# Patient Record
Sex: Female | Born: 1945 | Race: White | Hispanic: No | Marital: Married | Smoking: Never smoker
Health system: Southern US, Community
[De-identification: ages and names within clinical notes are randomized; demographics above are authoritative.]

## PROBLEM LIST (undated history)

## (undated) DIAGNOSIS — T7840XA Allergy, unspecified, initial encounter: Secondary | ICD-10-CM

## (undated) DIAGNOSIS — C801 Malignant (primary) neoplasm, unspecified: Secondary | ICD-10-CM

## (undated) DIAGNOSIS — T8859XA Other complications of anesthesia, initial encounter: Secondary | ICD-10-CM

## (undated) DIAGNOSIS — Z9221 Personal history of antineoplastic chemotherapy: Secondary | ICD-10-CM

## (undated) DIAGNOSIS — T4145XA Adverse effect of unspecified anesthetic, initial encounter: Secondary | ICD-10-CM

## (undated) DIAGNOSIS — I499 Cardiac arrhythmia, unspecified: Secondary | ICD-10-CM

## (undated) DIAGNOSIS — K219 Gastro-esophageal reflux disease without esophagitis: Secondary | ICD-10-CM

## (undated) DIAGNOSIS — I517 Cardiomegaly: Secondary | ICD-10-CM

## (undated) DIAGNOSIS — Z974 Presence of external hearing-aid: Secondary | ICD-10-CM

## (undated) DIAGNOSIS — Z923 Personal history of irradiation: Secondary | ICD-10-CM

## (undated) DIAGNOSIS — I10 Essential (primary) hypertension: Secondary | ICD-10-CM

## (undated) DIAGNOSIS — T884XXA Failed or difficult intubation, initial encounter: Secondary | ICD-10-CM

## (undated) DIAGNOSIS — Z8619 Personal history of other infectious and parasitic diseases: Secondary | ICD-10-CM

## (undated) HISTORY — PX: BREAST LUMPECTOMY: SHX2

## (undated) HISTORY — DX: Personal history of other infectious and parasitic diseases: Z86.19

## (undated) HISTORY — DX: Essential (primary) hypertension: I10

## (undated) HISTORY — DX: Allergy, unspecified, initial encounter: T78.40XA

## (undated) HISTORY — PX: APPENDECTOMY: SHX54

## (undated) HISTORY — PX: TONSILLECTOMY: SUR1361

## (undated) HISTORY — PX: COLONOSCOPY: SHX174

## (undated) HISTORY — PX: BREAST REDUCTION SURGERY: SHX8

---

## 1997-09-29 DIAGNOSIS — T884XXA Failed or difficult intubation, initial encounter: Secondary | ICD-10-CM

## 1997-09-29 HISTORY — PX: ABDOMINAL HYSTERECTOMY: SHX81

## 1997-09-29 HISTORY — DX: Failed or difficult intubation, initial encounter: T88.4XXA

## 1997-09-29 HISTORY — PX: THYROID LOBECTOMY: SHX420

## 1998-01-22 ENCOUNTER — Other Ambulatory Visit: Admission: RE | Admit: 1998-01-22 | Discharge: 1998-01-22 | Payer: Self-pay | Admitting: *Deleted

## 1998-02-15 ENCOUNTER — Ambulatory Visit (HOSPITAL_COMMUNITY): Admission: RE | Admit: 1998-02-15 | Discharge: 1998-02-16 | Payer: Self-pay | Admitting: Surgery

## 1998-03-05 ENCOUNTER — Inpatient Hospital Stay (HOSPITAL_COMMUNITY): Admission: RE | Admit: 1998-03-05 | Discharge: 1998-03-08 | Payer: Self-pay | Admitting: Obstetrics and Gynecology

## 1999-09-13 ENCOUNTER — Encounter: Admission: RE | Admit: 1999-09-13 | Discharge: 1999-09-13 | Payer: Self-pay | Admitting: *Deleted

## 2000-09-15 ENCOUNTER — Encounter: Admission: RE | Admit: 2000-09-15 | Discharge: 2000-09-15 | Payer: Self-pay | Admitting: *Deleted

## 2001-09-27 ENCOUNTER — Encounter: Admission: RE | Admit: 2001-09-27 | Discharge: 2001-09-27 | Payer: Self-pay | Admitting: *Deleted

## 2001-10-15 ENCOUNTER — Other Ambulatory Visit: Admission: RE | Admit: 2001-10-15 | Discharge: 2001-10-15 | Payer: Self-pay | Admitting: Obstetrics and Gynecology

## 2002-10-03 ENCOUNTER — Encounter: Admission: RE | Admit: 2002-10-03 | Discharge: 2002-10-03 | Payer: Self-pay | Admitting: *Deleted

## 2002-10-11 ENCOUNTER — Other Ambulatory Visit: Admission: RE | Admit: 2002-10-11 | Discharge: 2002-10-11 | Payer: Self-pay | Admitting: Obstetrics and Gynecology

## 2003-10-09 ENCOUNTER — Encounter: Admission: RE | Admit: 2003-10-09 | Discharge: 2003-10-09 | Payer: Self-pay | Admitting: Orthopaedic Surgery

## 2003-10-09 ENCOUNTER — Encounter: Admission: RE | Admit: 2003-10-09 | Discharge: 2003-10-09 | Payer: Self-pay | Admitting: *Deleted

## 2003-10-13 ENCOUNTER — Other Ambulatory Visit: Admission: RE | Admit: 2003-10-13 | Discharge: 2003-10-13 | Payer: Self-pay | Admitting: Obstetrics and Gynecology

## 2004-09-29 HISTORY — PX: ROTATOR CUFF REPAIR: SHX139

## 2005-10-09 ENCOUNTER — Ambulatory Visit (HOSPITAL_COMMUNITY): Admission: RE | Admit: 2005-10-09 | Discharge: 2005-10-09 | Payer: Self-pay | Admitting: *Deleted

## 2006-10-08 ENCOUNTER — Ambulatory Visit: Payer: Self-pay | Admitting: General Surgery

## 2006-10-08 LAB — HM COLONOSCOPY

## 2006-10-12 ENCOUNTER — Ambulatory Visit (HOSPITAL_COMMUNITY): Admission: RE | Admit: 2006-10-12 | Discharge: 2006-10-12 | Payer: Self-pay | Admitting: *Deleted

## 2007-10-15 ENCOUNTER — Ambulatory Visit (HOSPITAL_COMMUNITY): Admission: RE | Admit: 2007-10-15 | Discharge: 2007-10-15 | Payer: Self-pay | Admitting: Obstetrics and Gynecology

## 2008-10-16 ENCOUNTER — Ambulatory Visit (HOSPITAL_COMMUNITY): Admission: RE | Admit: 2008-10-16 | Discharge: 2008-10-16 | Payer: Self-pay | Admitting: Obstetrics and Gynecology

## 2009-10-18 ENCOUNTER — Ambulatory Visit (HOSPITAL_COMMUNITY): Admission: RE | Admit: 2009-10-18 | Discharge: 2009-10-18 | Payer: Self-pay | Admitting: Obstetrics and Gynecology

## 2010-01-15 DIAGNOSIS — B029 Zoster without complications: Secondary | ICD-10-CM

## 2010-01-15 HISTORY — DX: Zoster without complications: B02.9

## 2010-10-21 ENCOUNTER — Ambulatory Visit (HOSPITAL_COMMUNITY)
Admission: RE | Admit: 2010-10-21 | Discharge: 2010-10-21 | Payer: Self-pay | Source: Home / Self Care | Attending: Obstetrics and Gynecology | Admitting: Obstetrics and Gynecology

## 2011-10-01 ENCOUNTER — Other Ambulatory Visit (HOSPITAL_COMMUNITY): Payer: Self-pay | Admitting: Obstetrics and Gynecology

## 2011-10-01 DIAGNOSIS — Z1231 Encounter for screening mammogram for malignant neoplasm of breast: Secondary | ICD-10-CM

## 2011-10-28 ENCOUNTER — Ambulatory Visit (HOSPITAL_COMMUNITY)
Admission: RE | Admit: 2011-10-28 | Discharge: 2011-10-28 | Disposition: A | Discharge status: Home / Self Care | Payer: Managed Care, Other (non HMO) | Source: Ambulatory Visit | Attending: Obstetrics and Gynecology | Admitting: Obstetrics and Gynecology

## 2011-10-28 DIAGNOSIS — Z1231 Encounter for screening mammogram for malignant neoplasm of breast: Secondary | ICD-10-CM | POA: Insufficient documentation

## 2012-09-28 ENCOUNTER — Other Ambulatory Visit (HOSPITAL_COMMUNITY): Payer: Self-pay | Admitting: Obstetrics and Gynecology

## 2012-09-28 DIAGNOSIS — Z1231 Encounter for screening mammogram for malignant neoplasm of breast: Secondary | ICD-10-CM

## 2012-10-29 ENCOUNTER — Ambulatory Visit (HOSPITAL_COMMUNITY): Payer: Medicare Other

## 2012-11-05 ENCOUNTER — Ambulatory Visit (HOSPITAL_COMMUNITY)
Admission: RE | Admit: 2012-11-05 | Discharge: 2012-11-05 | Disposition: A | Discharge status: Home / Self Care | Payer: Medicare Other | Source: Ambulatory Visit | Attending: Obstetrics and Gynecology | Admitting: Obstetrics and Gynecology

## 2012-11-05 DIAGNOSIS — Z1231 Encounter for screening mammogram for malignant neoplasm of breast: Secondary | ICD-10-CM

## 2012-12-31 LAB — CBC AND DIFFERENTIAL
HEMATOCRIT: 38 % (ref 36–46)
Hemoglobin: 12.7 g/dL (ref 12.0–16.0)
PLATELETS: 257 10*3/uL (ref 150–399)
WBC: 6.6 10*3/mL

## 2012-12-31 LAB — BASIC METABOLIC PANEL
BUN: 14 mg/dL (ref 4–21)
CREATININE: 0.8 mg/dL (ref 0.5–1.1)
Glucose: 94 mg/dL
Potassium: 4.5 mmol/L (ref 3.4–5.3)
Sodium: 141 mmol/L (ref 137–147)

## 2012-12-31 LAB — LIPID PANEL
CHOLESTEROL: 206 mg/dL — AB (ref 0–200)
HDL: 64 mg/dL (ref 35–70)
LDL Cholesterol: 122 mg/dL
TRIGLYCERIDES: 99 mg/dL (ref 40–160)

## 2012-12-31 LAB — HEPATIC FUNCTION PANEL
ALT: 21 U/L (ref 7–35)
AST: 18 U/L (ref 13–35)

## 2012-12-31 LAB — TSH: TSH: 1.37 u[IU]/mL (ref 0.41–5.90)

## 2013-06-01 ENCOUNTER — Ambulatory Visit: Payer: Self-pay | Admitting: Family Medicine

## 2013-07-05 ENCOUNTER — Ambulatory Visit: Payer: Self-pay | Admitting: Family Medicine

## 2013-07-25 ENCOUNTER — Encounter (INDEPENDENT_AMBULATORY_CARE_PROVIDER_SITE_OTHER): Payer: Self-pay | Admitting: Surgery

## 2013-07-25 ENCOUNTER — Other Ambulatory Visit (INDEPENDENT_AMBULATORY_CARE_PROVIDER_SITE_OTHER): Payer: Self-pay

## 2013-07-25 ENCOUNTER — Ambulatory Visit (INDEPENDENT_AMBULATORY_CARE_PROVIDER_SITE_OTHER): Payer: Medicare Other | Admitting: Surgery

## 2013-07-25 VITALS — BP 116/82 | HR 88 | Temp 98.4°F | Resp 15 | Ht 66.0 in | Wt 186.2 lb

## 2013-07-25 DIAGNOSIS — E042 Nontoxic multinodular goiter: Secondary | ICD-10-CM | POA: Insufficient documentation

## 2013-07-25 DIAGNOSIS — Z9009 Acquired absence of other part of head and neck: Secondary | ICD-10-CM | POA: Insufficient documentation

## 2013-07-25 NOTE — Patient Instructions (Signed)
Please register for MyChart so that you may access your results online.  Adiana Smelcer M. Paarth Cropper, MD, FACS [facility] Office: [phone]   

## 2013-07-25 NOTE — Progress Notes (Signed)
General Surgery [facility]  Chief Complaint  Patient presents with  . New Evaluation    eval thyroid nodules - referral from Dr. Mila Merry, [facility]    HISTORY: Patient is a 67 year old female known to my surgical practice from previous right thyroid lobectomy in 1999. Final pathologic results were benign on a dominant right thyroid nodule. Patient has been followed by her primary care physician. TSH levels have remained normal and she has not required thyroid hormone supplementation. Patient notes that her voice is occasionally "gravelly" but has not had significant hoarseness. She does have occasional dysphagia.  At recent physical examination the patient was noted to have nodules in the left thyroid lobe. On 10/07/2014she underwent ultrasound examination of the thyroid. This showed no residual tissue in the right thyroid bed. Left thyroid lobe was slightly enlarged at 6.0 cm. The left lobe contained multiple nodules all less than 2 cm in size. There were no particularly worrisome findings.  Patient is referred today for evaluation and recommendations for further management.  Past Medical History  Diagnosis Date  . Hypertension   . Allergy     Current Outpatient Prescriptions  Medication Sig Dispense Refill  . calcium-vitamin D (OSCAL WITH D) 500-200 MG-UNIT per tablet Take 1 tablet by mouth.      . co-enzyme Q-10 30 MG capsule Take 30 mg by mouth 3 (three) times daily.      . fluticasone (FLONASE) 50 MCG/ACT nasal spray Place 2 sprays into the nose daily.      . milk thistle 175 MG tablet Take 175 mg by mouth daily.      . montelukast (SINGULAIR) 10 MG tablet Take 10 mg by mouth at bedtime.      Marland Kitchen omeprazole (PRILOSEC) 20 MG capsule Take 20 mg by mouth daily.      Marland Kitchen telmisartan (MICARDIS) 40 MG tablet Take 40 mg by mouth daily.       No current facility-administered medications for this visit.    Allergies  Allergen Reactions  .  Codeine Sulfate     Vomiting, nausea    Family History  Problem Relation Age of Onset  . Cancer Father     bladder & pancreatic    History   Social History  . Marital Status: Married    Spouse Name: N/A    Number of Children: N/A  . Years of Education: N/A   Social History Main Topics  . Smoking status: Never Smoker   . Smokeless tobacco: Never Used  . Alcohol Use: Yes  . Drug Use: No  . Sexual Activity: None   Other Topics Concern  . None   Social History Narrative  . None    REVIEW OF SYSTEMS - PERTINENT POSITIVES ONLY: Mild vocal changes as noted. Occasional dysphagia. Denies tremor. Denies palpitations.  EXAM: Filed Vitals:   07/25/13 1356  BP: 116/82  Pulse: 88  Temp: 98.4 F (36.9 C)  Resp: 15    HEENT: normocephalic; pupils equal and reactive; sclerae clear; dentition good; mucous membranes moist NECK:  Well-healed cervical incision with good cosmetic result; palpation shows no nodules or masses in the right thyroid bed; palpation of the left thyroid lobe shows it to be multinodular without dominant or discrete mass; left lobe is mobile with swallowing and does extend the knee to left clavicle; symmetric on extension; no palpable anterior or posterior cervical lymphadenopathy; no supraclavicular masses; no tenderness; there is a small, less than 1 cm, lymph  node in the me at left posterior cervical chain which is likely within normal limits CHEST: clear to auscultation bilaterally without rales, rhonchi, or wheezes CARDIAC: regular rate and rhythm without significant murmur; peripheral pulses are full EXT:  non-tender without edema; no deformity NEURO: no gross focal deficits; no sign of tremor   LABORATORY RESULTS: See [facility] (CHL-Epic) for most recent results  RADIOLOGY RESULTS: See [facility] (CHL-Epic) for most recent results  IMPRESSION: #1 personal history of right thyroid lobectomy for benign disease #2 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 We reviewed her studies. I provided her with written literature to review at home. At this point there is no indication for left thyroid lobectomy. She will have a TSH level checked in February with her annual visit to her primary care physician. We will repeat her thyroid ultrasound at the six-month interval in April 2015. Patient will return following that study for physical examination.  Velora Heckler, MD, FACS General & Endocrine Surgery [facility]  Primary Care Physician: Clydell Hakim, MD

## 2013-07-26 ENCOUNTER — Telehealth (INDEPENDENT_AMBULATORY_CARE_PROVIDER_SITE_OTHER): Payer: Self-pay | Admitting: *Deleted

## 2013-07-26 ENCOUNTER — Encounter (INDEPENDENT_AMBULATORY_CARE_PROVIDER_SITE_OTHER): Payer: Self-pay

## 2013-07-26 ENCOUNTER — Encounter (INDEPENDENT_AMBULATORY_CARE_PROVIDER_SITE_OTHER): Payer: Self-pay | Admitting: *Deleted

## 2013-07-26 NOTE — Telephone Encounter (Signed)
LMOM with pts appt details.  Pt has an appt for her f/u thyroid ultrasound at [facility] on 01/24/14 with an arrival time of 8:45am.  If pt needs to reschedule their number is [phone].  I have also mailed pt a letter with appt information as well.

## 2013-09-29 DIAGNOSIS — C50919 Malignant neoplasm of unspecified site of unspecified female breast: Secondary | ICD-10-CM

## 2013-09-29 HISTORY — DX: Malignant neoplasm of unspecified site of unspecified female breast: C50.919

## 2013-11-10 ENCOUNTER — Other Ambulatory Visit (HOSPITAL_COMMUNITY): Payer: Self-pay | Admitting: Family Medicine

## 2013-11-10 DIAGNOSIS — Z1231 Encounter for screening mammogram for malignant neoplasm of breast: Secondary | ICD-10-CM

## 2013-11-14 ENCOUNTER — Ambulatory Visit (HOSPITAL_COMMUNITY)
Admission: RE | Admit: 2013-11-14 | Discharge: 2013-11-14 | Disposition: A | Discharge status: Home / Self Care | Payer: Medicare Other | Source: Ambulatory Visit | Attending: Family Medicine | Admitting: Family Medicine

## 2013-11-14 DIAGNOSIS — Z1231 Encounter for screening mammogram for malignant neoplasm of breast: Secondary | ICD-10-CM | POA: Insufficient documentation

## 2013-11-16 ENCOUNTER — Other Ambulatory Visit: Payer: Self-pay | Admitting: Family Medicine

## 2013-11-16 DIAGNOSIS — R928 Other abnormal and inconclusive findings on diagnostic imaging of breast: Secondary | ICD-10-CM

## 2013-12-01 ENCOUNTER — Ambulatory Visit
Admission: RE | Admit: 2013-12-01 | Discharge: 2013-12-01 | Disposition: A | Discharge status: Home / Self Care | Payer: Medicare Other | Source: Ambulatory Visit | Attending: Family Medicine | Admitting: Family Medicine

## 2013-12-01 ENCOUNTER — Other Ambulatory Visit: Payer: Self-pay | Admitting: Family Medicine

## 2013-12-01 DIAGNOSIS — R928 Other abnormal and inconclusive findings on diagnostic imaging of breast: Secondary | ICD-10-CM

## 2013-12-01 HISTORY — PX: BREAST SURGERY: SHX581

## 2013-12-07 ENCOUNTER — Ambulatory Visit (INDEPENDENT_AMBULATORY_CARE_PROVIDER_SITE_OTHER): Payer: Medicare Other | Admitting: Surgery

## 2013-12-07 ENCOUNTER — Encounter (HOSPITAL_BASED_OUTPATIENT_CLINIC_OR_DEPARTMENT_OTHER): Payer: Self-pay | Admitting: *Deleted

## 2013-12-07 ENCOUNTER — Other Ambulatory Visit (INDEPENDENT_AMBULATORY_CARE_PROVIDER_SITE_OTHER): Payer: Self-pay | Admitting: Surgery

## 2013-12-07 ENCOUNTER — Other Ambulatory Visit (INDEPENDENT_AMBULATORY_CARE_PROVIDER_SITE_OTHER): Payer: Self-pay

## 2013-12-07 ENCOUNTER — Encounter (HOSPITAL_BASED_OUTPATIENT_CLINIC_OR_DEPARTMENT_OTHER)
Admission: RE | Admit: 2013-12-07 | Discharge: 2013-12-07 | Disposition: A | Discharge status: Home / Self Care | Payer: Medicare Other | Source: Ambulatory Visit | Attending: Surgery | Admitting: Surgery

## 2013-12-07 ENCOUNTER — Encounter (INDEPENDENT_AMBULATORY_CARE_PROVIDER_SITE_OTHER): Payer: Self-pay | Admitting: Surgery

## 2013-12-07 VITALS — BP 135/82 | HR 74 | Temp 98.5°F | Resp 14 | Ht 65.5 in | Wt 188.2 lb

## 2013-12-07 DIAGNOSIS — Z170 Estrogen receptor positive status [ER+]: Secondary | ICD-10-CM

## 2013-12-07 DIAGNOSIS — C50511 Malignant neoplasm of lower-outer quadrant of right female breast: Secondary | ICD-10-CM

## 2013-12-07 DIAGNOSIS — C50919 Malignant neoplasm of unspecified site of unspecified female breast: Secondary | ICD-10-CM

## 2013-12-07 DIAGNOSIS — C50519 Malignant neoplasm of lower-outer quadrant of unspecified female breast: Secondary | ICD-10-CM

## 2013-12-07 DIAGNOSIS — Z01812 Encounter for preprocedural laboratory examination: Secondary | ICD-10-CM | POA: Insufficient documentation

## 2013-12-07 LAB — BASIC METABOLIC PANEL
BUN: 16 mg/dL (ref 6–23)
CALCIUM: 9.4 mg/dL (ref 8.4–10.5)
CO2: 23 meq/L (ref 19–32)
CREATININE: 0.99 mg/dL (ref 0.50–1.10)
Chloride: 103 mEq/L (ref 96–112)
GFR calc non Af Amer: 58 mL/min — ABNORMAL LOW (ref >90)
GFR, EST AFRICAN AMERICAN: 67 mL/min — AB (ref >90)
Glucose, Bld: 133 mg/dL — ABNORMAL HIGH (ref 70–99)
Potassium: 3.5 mEq/L — ABNORMAL LOW (ref 3.7–5.3)
SODIUM: 141 meq/L (ref 137–147)

## 2013-12-07 NOTE — Progress Notes (Signed)
General Surgery [facility]  Chief Complaint  Patient presents with  . New Evaluation    newly diagnosed right breast cancer - referral from Dr. Evangeline Dakin; primary care is Dr. [name]    HISTORY: The patient is a 68 year old female known to my surgical practice from management of thyroid nodules. Patient had routine screening mammography. She was noted to have an abnormality in the right breast. She underwent additional views and a right breast ultrasound. This showed a suspicious 6 mm nodule in the inferior outer quadrant of the right breast. There were microcalcifications. This was a new finding compared to her mammogram of 1 year ago. Patient underwent core needle biopsy showing invasive ductal carcinoma. Patient is now referred for definitive surgical resection.  Patient had had a reduction mammoplasty in the 1980s. She has had no other history of breast disease and no other breast biopsies. There is no family history of breast disease and specifically no history of breast cancer. Patient denies any recent pain. She denies nipple discharge.  Past Medical History  Diagnosis Date  . Hypertension   . Allergy     Current Outpatient Prescriptions  Medication Sig Dispense Refill  . milk thistle 175 MG tablet Take 175 mg by mouth daily.      . montelukast (SINGULAIR) 10 MG tablet Take 10 mg by mouth at bedtime.      Marland Kitchen omeprazole (PRILOSEC) 20 MG capsule Take 20 mg by mouth daily.      Marland Kitchen telmisartan (MICARDIS) 40 MG tablet Take 40 mg by mouth daily.      . calcium-vitamin D (OSCAL WITH D) 500-200 MG-UNIT per tablet Take 1 tablet by mouth.      . co-enzyme Q-10 30 MG capsule Take 30 mg by mouth 3 (three) times daily.      Marland Kitchen loratadine (CLARITIN) 10 MG tablet Take 10 mg by mouth daily.       No current facility-administered medications for this visit.    Allergies  Allergen Reactions  . Codeine Sulfate     Vomiting, nausea    Family History  Problem  Relation Age of Onset  . Cancer Father     bladder & pancreatic    History   Social History  . Marital Status: Married    Spouse Name: N/A    Number of Children: N/A  . Years of Education: N/A   Social History Main Topics  . Smoking status: Never Smoker   . Smokeless tobacco: Never Used  . Alcohol Use: Yes     Comment: seldom  . Drug Use: No  . Sexual Activity: None   Other Topics Concern  . None   Social History Narrative  . None    REVIEW OF SYSTEMS - PERTINENT POSITIVES ONLY: Denies breast pain. Denies breast mass. Denies nipple discharge.  EXAM: Filed Vitals:   12/07/13 1016  BP: 135/82  Pulse: 74  Temp: 98.5 F (36.9 C)  Resp: 14    GENERAL: well-developed, well-nourished, no acute distress HEENT: normocephalic; pupils equal and reactive; sclerae clear; dentition good; mucous membranes moist NECK:  Well-healed anterior cervical incision; symmetric on extension; no palpable anterior or posterior cervical lymphadenopathy; no supraclavicular masses; no tenderness CHEST: clear to auscultation bilaterally without rales, rhonchi, or wheezes CARDIAC: regular rate and rhythm without significant murmur; peripheral pulses are full BREAST: Well-healed surgical incisions bilaterally consistent with reduction mammoplasty; area of ecchymosis in the inferior. Areolar area of the right breast consistent with recent  biopsy; palpation of the right breast shows diffusely nodular breast parenchyma with output dominant or discrete mass; right axilla is free of adenopathy.  Left breast shows diffusely nodular breast parenchyma without discrete or dominant mass. Left axilla is free of adenopathy. EXT:  non-tender without edema; no deformity NEURO: no gross focal deficits; no sign of tremor   LABORATORY RESULTS: See [facility] (CHL-Epic) for most recent results  RADIOLOGY RESULTS: See [facility] (CHL-Epic) for most recent results  IMPRESSION: #1 invasive ductal  carcinoma, right breast, 6 mm #2 personal history of thyroid nodules  PLAN: The patient and I reviewed the radiographic findings and the pathology results. I have recommended partial mastectomy with wire localization. We will perform concurrent sentinel lymph node biopsy. Risk and benefits are discussed with the patient. We will proceed as soon as possible at a time convenient for the patient.  The risks and benefits of the procedure have been discussed at length with the patient.  The patient understands the proposed procedure, potential alternative treatments, and the course of recovery to be expected.  All of the patient's questions have been answered at this time.  The patient wishes to proceed with surgery.  Earnstine Regal, MD, [facility]  Primary Care Physician: [name], MD

## 2013-12-07 NOTE — Progress Notes (Signed)
To come in for bmet-has had irreg hr since birth-sees dr fath-will get notes and ekg

## 2013-12-07 NOTE — Patient Instructions (Signed)
Lumpectomy A lumpectomy is a form of "breast conserving" or "breast preservation" surgery. It may also be referred to as a partial mastectomy. During a lumpectomy, the portion of the breast that contains the cancerous tumor or breast mass (the lump) is removed. Some normal tissue around the lump may also be removed to make sure all the tumor has been removed. This surgery should take 40 minutes or less. LET YOUR HEALTH CARE PROVIDER KNOW ABOUT:  Any allergies you have.  All medicines you are taking, including vitamins, herbs, eye drops, creams, and over-the-counter medicines.  Previous problems you or members of your family have had with the use of anesthetics.  Any blood disorders you have.  Previous surgeries you have had.  Medical conditions you have. RISKS AND COMPLICATIONS Generally, this is a safe procedure. However, as with any procedure, complications can occur. Possible complications include:  Bleeding.  Infection.  Pain.  Temporary swelling.  Change in the shape of the breast, particularly if a large portion is removed. BEFORE THE PROCEDURE  Ask your health care provider about changing or stopping your regular medicines.  Do not eat or drink anything for 7 8 hours before the surgery or as directed by your health care provider. Ask your health care provider if you can take a sip of water with any approved medicines.  On the day of surgery, your healthcare provider will use a mammogram or ultrasound to locate and mark the tumor in your breast. These markings on your breast will show where the cut (incision) will be made. PROCEDURE   An IV tube will be put into one of your veins.  You may be given medicine to help you relax before the surgery (sedative). You will be given one of the following:  A medicine that numbs the area (local anesthesia).  A medicine that makes you go to sleep (general anesthesia).  Your health care provider will use a kind of electric scalpel  that uses heat to minimize bleeding (electrocautery knife).  A curved incision (like a smile or frown) that follows the natural curve of your breast is made, to allow for minimal scarring and better healing.  The tumor will be removed with some of the surrounding tissue. This will be sent to the lab for analysis. Your health care provider may also remove your lymph nodes at this time if needed.  Sometimes, but not always, a rubber tube called a drain will be surgically inserted into your breast area or armpit to collect excess fluid that may accumulate in the space where the tumor was. This drain is connected to a plastic bulb on the outside of your body. This drain creates suction to help remove the fluid.  The incisions will be closed with stitches (sutures).  A bandage may be placed over the incisions. AFTER THE PROCEDURE  You will be taken to the recovery area.  You will be given medicine for pain.  A small rubber drain may be placed in the breast for 2 3 days to prevent a collection of blood (hematoma) from developing in the breast. You will be given instructions on caring for the drain before you go home.  A pressure bandage (dressing) will be applied for 1 2 days to prevent bleeding. Ask your health care provider how to care for your bandage at home. Document Released: 10/27/2006 Document Revised: 05/18/2013 Document Reviewed: 02/18/2013 Exit[employer] Patient Information 2014 [employer].  

## 2013-12-08 NOTE — Progress Notes (Signed)
Quick Note:  These results are acceptable for scheduled surgery.  Hermila Millis M. Lakeita Panther, MD, FACS [facility] Office: [phone]   ______ 

## 2013-12-12 ENCOUNTER — Ambulatory Visit (HOSPITAL_BASED_OUTPATIENT_CLINIC_OR_DEPARTMENT_OTHER)
Admission: RE | Admit: 2013-12-12 | Discharge: 2013-12-12 | Disposition: A | Discharge status: Home / Self Care | Payer: Medicare Other | Source: Ambulatory Visit | Attending: Surgery | Admitting: Surgery

## 2013-12-12 ENCOUNTER — Encounter (HOSPITAL_BASED_OUTPATIENT_CLINIC_OR_DEPARTMENT_OTHER): Payer: Self-pay | Admitting: Anesthesiology

## 2013-12-12 ENCOUNTER — Encounter (HOSPITAL_BASED_OUTPATIENT_CLINIC_OR_DEPARTMENT_OTHER): Payer: Medicare Other | Admitting: Anesthesiology

## 2013-12-12 ENCOUNTER — Encounter (HOSPITAL_BASED_OUTPATIENT_CLINIC_OR_DEPARTMENT_OTHER)
Admission: RE | Disposition: A | Discharge status: Home / Self Care | Payer: Self-pay | Source: Ambulatory Visit | Attending: Surgery

## 2013-12-12 ENCOUNTER — Ambulatory Visit
Admission: RE | Admit: 2013-12-12 | Discharge: 2013-12-12 | Disposition: A | Discharge status: Home / Self Care | Payer: Medicare Other | Source: Ambulatory Visit | Attending: Surgery | Admitting: Surgery

## 2013-12-12 ENCOUNTER — Ambulatory Visit (HOSPITAL_COMMUNITY)
Admission: RE | Admit: 2013-12-12 | Discharge: 2013-12-12 | Disposition: A | Discharge status: Home / Self Care | Payer: Medicare Other | Source: Ambulatory Visit | Attending: Surgery | Admitting: Surgery

## 2013-12-12 ENCOUNTER — Ambulatory Visit (HOSPITAL_BASED_OUTPATIENT_CLINIC_OR_DEPARTMENT_OTHER): Payer: Medicare Other | Admitting: Anesthesiology

## 2013-12-12 ENCOUNTER — Other Ambulatory Visit (INDEPENDENT_AMBULATORY_CARE_PROVIDER_SITE_OTHER): Payer: Self-pay | Admitting: Surgery

## 2013-12-12 DIAGNOSIS — I10 Essential (primary) hypertension: Secondary | ICD-10-CM | POA: Insufficient documentation

## 2013-12-12 DIAGNOSIS — Z87898 Personal history of other specified conditions: Secondary | ICD-10-CM | POA: Insufficient documentation

## 2013-12-12 DIAGNOSIS — C50919 Malignant neoplasm of unspecified site of unspecified female breast: Secondary | ICD-10-CM

## 2013-12-12 DIAGNOSIS — C50519 Malignant neoplasm of lower-outer quadrant of unspecified female breast: Secondary | ICD-10-CM | POA: Insufficient documentation

## 2013-12-12 DIAGNOSIS — C50511 Malignant neoplasm of lower-outer quadrant of right female breast: Secondary | ICD-10-CM

## 2013-12-12 DIAGNOSIS — K219 Gastro-esophageal reflux disease without esophagitis: Secondary | ICD-10-CM | POA: Insufficient documentation

## 2013-12-12 DIAGNOSIS — Z170 Estrogen receptor positive status [ER+]: Secondary | ICD-10-CM

## 2013-12-12 HISTORY — DX: Presence of external hearing-aid: Z97.4

## 2013-12-12 HISTORY — DX: Gastro-esophageal reflux disease without esophagitis: K21.9

## 2013-12-12 HISTORY — PX: PARTIAL MASTECTOMY WITH NEEDLE LOCALIZATION AND AXILLARY SENTINEL LYMPH NODE BX: SHX6009

## 2013-12-12 HISTORY — DX: Failed or difficult intubation, initial encounter: T88.4XXA

## 2013-12-12 HISTORY — DX: Other complications of anesthesia, initial encounter: T88.59XA

## 2013-12-12 HISTORY — DX: Adverse effect of unspecified anesthetic, initial encounter: T41.45XA

## 2013-12-12 LAB — POCT HEMOGLOBIN-HEMACUE: Hemoglobin: 13.6 g/dL (ref 12.0–15.0)

## 2013-12-12 SURGERY — PARTIAL MASTECTOMY WITH NEEDLE LOCALIZATION AND AXILLARY SENTINEL LYMPH NODE BX
Anesthesia: General | Site: Breast | Laterality: Right

## 2013-12-12 MED ORDER — CEFAZOLIN SODIUM-DEXTROSE 2-3 GM-% IV SOLR
INTRAVENOUS | Status: AC
Start: 2013-12-12 — End: 2013-12-12
  Filled 2013-12-12: qty 50

## 2013-12-12 MED ORDER — MIDAZOLAM HCL 2 MG/2ML IJ SOLN
INTRAMUSCULAR | Status: AC
  Filled 2013-12-12: qty 2

## 2013-12-12 MED ORDER — OXYCODONE HCL 5 MG PO TABS
ORAL_TABLET | ORAL | Status: AC
  Filled 2013-12-12: qty 1

## 2013-12-12 MED ORDER — SODIUM CHLORIDE 0.9 % IJ SOLN
INTRAMUSCULAR | Status: AC
  Filled 2013-12-12: qty 10

## 2013-12-12 MED ORDER — LACTATED RINGERS IV SOLN
INTRAVENOUS | Status: DC | PRN
  Administered 2013-12-12 (×2): via INTRAVENOUS

## 2013-12-12 MED ORDER — TECHNETIUM TC 99M SULFUR COLLOID FILTERED
1.0000 | Freq: Once | INTRAVENOUS | Status: AC | PRN
  Administered 2013-12-12: 1 via INTRADERMAL

## 2013-12-12 MED ORDER — OXYCODONE HCL 5 MG PO TABS
5.0000 mg | ORAL_TABLET | Freq: Once | ORAL | Status: AC | PRN
  Administered 2013-12-12: 5 mg via ORAL

## 2013-12-12 MED ORDER — BUPIVACAINE HCL (PF) 0.25 % IJ SOLN
INTRAMUSCULAR | Status: AC
  Filled 2013-12-12: qty 30

## 2013-12-12 MED ORDER — PROPOFOL 10 MG/ML IV BOLUS
INTRAVENOUS | Status: DC | PRN
  Administered 2013-12-12: 200 mg via INTRAVENOUS

## 2013-12-12 MED ORDER — PROPOFOL 10 MG/ML IV BOLUS
INTRAVENOUS | Status: AC
  Filled 2013-12-12: qty 20

## 2013-12-12 MED ORDER — TRAMADOL HCL 50 MG PO TABS: 50.0000 mg | ORAL_TABLET | Freq: Four times a day (QID) | ORAL | Status: DC | PRN

## 2013-12-12 MED ORDER — FENTANYL CITRATE 0.05 MG/ML IJ SOLN
50.0000 ug | INTRAMUSCULAR | Status: DC | PRN
  Administered 2013-12-12: 50 ug via INTRAVENOUS

## 2013-12-12 MED ORDER — FENTANYL CITRATE 0.05 MG/ML IJ SOLN
INTRAMUSCULAR | Status: AC
  Filled 2013-12-12: qty 4

## 2013-12-12 MED ORDER — ONDANSETRON HCL 4 MG/2ML IJ SOLN
INTRAMUSCULAR | Status: DC | PRN
  Administered 2013-12-12: 4 mg via INTRAVENOUS

## 2013-12-12 MED ORDER — FENTANYL CITRATE 0.05 MG/ML IJ SOLN
INTRAMUSCULAR | Status: AC
  Filled 2013-12-12: qty 2

## 2013-12-12 MED ORDER — MIDAZOLAM HCL 2 MG/2ML IJ SOLN
1.0000 mg | INTRAMUSCULAR | Status: DC | PRN
  Administered 2013-12-12: 1 mg via INTRAVENOUS

## 2013-12-12 MED ORDER — LACTATED RINGERS IV SOLN
INTRAVENOUS | Status: DC
  Administered 2013-12-12: 12:00:00 via INTRAVENOUS

## 2013-12-12 MED ORDER — CEFAZOLIN SODIUM-DEXTROSE 2-3 GM-% IV SOLR
2.0000 g | INTRAVENOUS | Status: AC
  Administered 2013-12-12: 2 g via INTRAVENOUS

## 2013-12-12 MED ORDER — LIDOCAINE HCL (CARDIAC) 20 MG/ML IV SOLN
INTRAVENOUS | Status: DC | PRN
  Administered 2013-12-12: 80 mg via INTRAVENOUS

## 2013-12-12 MED ORDER — EPHEDRINE SULFATE 50 MG/ML IJ SOLN
INTRAMUSCULAR | Status: DC | PRN
  Administered 2013-12-12 (×2): 10 mg via INTRAVENOUS

## 2013-12-12 MED ORDER — HYDROMORPHONE HCL PF 1 MG/ML IJ SOLN
INTRAMUSCULAR | Status: AC
Start: 2013-12-12 — End: 2013-12-12
  Filled 2013-12-12: qty 1

## 2013-12-12 MED ORDER — DEXAMETHASONE SODIUM PHOSPHATE 4 MG/ML IJ SOLN
INTRAMUSCULAR | Status: DC | PRN
  Administered 2013-12-12: 10 mg via INTRAVENOUS

## 2013-12-12 MED ORDER — METOCLOPRAMIDE HCL 5 MG/ML IJ SOLN: 10.0000 mg | Freq: Once | INTRAMUSCULAR | Status: DC | PRN

## 2013-12-12 MED ORDER — OXYCODONE HCL 5 MG/5ML PO SOLN: 5.0000 mg | Freq: Once | ORAL | Status: AC | PRN

## 2013-12-12 MED ORDER — HYDROMORPHONE HCL PF 1 MG/ML IJ SOLN
0.2500 mg | INTRAMUSCULAR | Status: DC | PRN
  Administered 2013-12-12: 0.5 mg via INTRAVENOUS

## 2013-12-12 MED ORDER — FENTANYL CITRATE 0.05 MG/ML IJ SOLN
INTRAMUSCULAR | Status: DC | PRN
  Administered 2013-12-12 (×2): 50 ug via INTRAVENOUS

## 2013-12-12 MED ORDER — BUPIVACAINE HCL (PF) 0.5 % IJ SOLN
INTRAMUSCULAR | Status: DC | PRN
  Administered 2013-12-12: 10 mL

## 2013-12-12 SURGICAL SUPPLY — 61 items
APL SKNCLS STERI-STRIP NONHPOA (GAUZE/BANDAGES/DRESSINGS) ×1
APPLIER CLIP 11 MED OPEN (CLIP) ×3
APR CLP MED 11 20 MLT OPN (CLIP) ×1
BENZOIN TINCTURE PRP APPL 2/3 (GAUZE/BANDAGES/DRESSINGS) ×3 IMPLANT
BINDER BREAST LRG (GAUZE/BANDAGES/DRESSINGS) ×3 IMPLANT
BLADE HEX COATED 2.75 (ELECTRODE) ×3 IMPLANT
BLADE SURG 10 STRL SS (BLADE) ×1 IMPLANT
BLADE SURG 15 STRL LF DISP TIS (BLADE) ×2 IMPLANT
BLADE SURG 15 STRL SS (BLADE) ×6
CANISTER SUCT 1200ML W/VALVE (MISCELLANEOUS) ×3 IMPLANT
CHLORAPREP W/TINT 26ML (MISCELLANEOUS) ×3 IMPLANT
CLIP APPLIE 11 MED OPEN (CLIP) IMPLANT
CLIP TI WIDE RED SMALL 6 (CLIP) IMPLANT
CLOSURE WOUND 1/2 X4 (GAUZE/BANDAGES/DRESSINGS) ×1
COVER MAYO STAND STRL (DRAPES) ×3 IMPLANT
COVER PROBE W GEL 5X96 (DRAPES) ×3 IMPLANT
COVER TABLE BACK 60X90 (DRAPES) ×3 IMPLANT
DECANTER SPIKE VIAL GLASS SM (MISCELLANEOUS) ×2 IMPLANT
DEVICE DUBIN W/COMP PLATE 8390 (MISCELLANEOUS) ×3 IMPLANT
DRAIN HEMOVAC 1/8 X 5 (WOUND CARE) IMPLANT
DRAPE LAPAROSCOPIC ABDOMINAL (DRAPES) ×3 IMPLANT
DRAPE UTILITY XL STRL (DRAPES) ×3 IMPLANT
ELECT REM PT RETURN 9FT ADLT (ELECTROSURGICAL) ×3
ELECTRODE REM PT RTRN 9FT ADLT (ELECTROSURGICAL) ×1 IMPLANT
EVACUATOR SILICONE 100CC (DRAIN) IMPLANT
GLOVE BIO SURGEON STRL SZ7 (GLOVE) ×2 IMPLANT
GLOVE BIOGEL PI IND STRL 7.5 (GLOVE) IMPLANT
GLOVE BIOGEL PI INDICATOR 7.5 (GLOVE) ×2
GLOVE EXAM NITRILE EXT CUFF MD (GLOVE) ×3 IMPLANT
GLOVE SURG ORTHO 8.0 STRL STRW (GLOVE) ×5 IMPLANT
GOWN STRL REUS W/ TWL LRG LVL3 (GOWN DISPOSABLE) ×1 IMPLANT
GOWN STRL REUS W/ TWL XL LVL3 (GOWN DISPOSABLE) ×1 IMPLANT
GOWN STRL REUS W/TWL LRG LVL3 (GOWN DISPOSABLE) ×3
GOWN STRL REUS W/TWL XL LVL3 (GOWN DISPOSABLE) ×3
KIT MARKER MARGIN INK (KITS) ×3 IMPLANT
NDL HYPO 25X1 1.5 SAFETY (NEEDLE) ×2 IMPLANT
NDL SAFETY ECLIPSE 18X1.5 (NEEDLE) IMPLANT
NEEDLE HYPO 18GX1.5 SHARP (NEEDLE)
NEEDLE HYPO 25X1 1.5 SAFETY (NEEDLE) ×3 IMPLANT
PACK BASIN DAY SURGERY FS (CUSTOM PROCEDURE TRAY) ×3 IMPLANT
PAD ALCOHOL SWAB (MISCELLANEOUS) ×1 IMPLANT
PENCIL BUTTON HOLSTER BLD 10FT (ELECTRODE) ×3 IMPLANT
PIN SAFETY STERILE (MISCELLANEOUS) IMPLANT
SLEEVE SCD COMPRESS KNEE MED (MISCELLANEOUS) ×2 IMPLANT
SPONGE LAP 18X18 X RAY DECT (DISPOSABLE) ×3 IMPLANT
STAPLER VISISTAT 35W (STAPLE) ×1 IMPLANT
STRIP CLOSURE SKIN 1/2X4 (GAUZE/BANDAGES/DRESSINGS) ×2 IMPLANT
SUT ETHILON 3 0 FSL (SUTURE) ×3 IMPLANT
SUT MNCRL AB 3-0 PS2 18 (SUTURE) IMPLANT
SUT MNCRL AB 4-0 PS2 18 (SUTURE) ×2 IMPLANT
SUT VIC AB 2-0 SH 27 (SUTURE)
SUT VIC AB 2-0 SH 27XBRD (SUTURE) IMPLANT
SUT VIC AB 3-0 SH 27 (SUTURE)
SUT VIC AB 3-0 SH 27X BRD (SUTURE) IMPLANT
SUT VICRYL 3-0 CR8 SH (SUTURE) ×3 IMPLANT
SYR CONTROL 10ML LL (SYRINGE) ×4 IMPLANT
TOWEL OR 17X24 6PK STRL BLUE (TOWEL DISPOSABLE) ×3 IMPLANT
TOWEL OR NON WOVEN STRL DISP B (DISPOSABLE) ×1 IMPLANT
TUBE CONNECTING 20'X1/4 (TUBING) ×1
TUBE CONNECTING 20X1/4 (TUBING) ×2 IMPLANT
YANKAUER SUCT BULB TIP NO VENT (SUCTIONS) ×3 IMPLANT

## 2013-12-12 NOTE — Interval H&P Note (Signed)
History and Physical Interval Note:  12/12/2013 12:01 PM  Martha Evans  has presented today for surgery, with the diagnosis of right breast cancer.  The various methods of treatment have been discussed with the patient and family. After consideration of risks, benefits and other options for treatment, the patient has consented to    Procedure(s): PARTIAL MASTECTOMY WITH NEEDLE LOCALIZATION AND AXILLARY SENTINEL LYMPH NODE BX (Right) as a surgical intervention .    The patient's history has been reviewed, patient examined, no change in status, stable for surgery.  I have reviewed the patient's chart and labs.  Questions were answered to the patient's satisfaction.    Earnstine Regal, MD, [facility] Office: [place]

## 2013-12-12 NOTE — H&P (View-Only) (Signed)
General Surgery [facility]  Chief Complaint  Patient presents with  . New Evaluation    newly diagnosed right breast cancer - referral from Dr. Evangeline Dakin; primary care is Dr. [name]    HISTORY: The patient is a 68 year old female known to my surgical practice from management of thyroid nodules. Patient had routine screening mammography. She was noted to have an abnormality in the right breast. She underwent additional views and a right breast ultrasound. This showed a suspicious 6 mm nodule in the inferior outer quadrant of the right breast. There were microcalcifications. This was a new finding compared to her mammogram of 1 year ago. Patient underwent core needle biopsy showing invasive ductal carcinoma. Patient is now referred for definitive surgical resection.  Patient had had a reduction mammoplasty in the 1980s. She has had no other history of breast disease and no other breast biopsies. There is no family history of breast disease and specifically no history of breast cancer. Patient denies any recent pain. She denies nipple discharge.  Past Medical History  Diagnosis Date  . Hypertension   . Allergy     Current Outpatient Prescriptions  Medication Sig Dispense Refill  . milk thistle 175 MG tablet Take 175 mg by mouth daily.      . montelukast (SINGULAIR) 10 MG tablet Take 10 mg by mouth at bedtime.      Marland Kitchen omeprazole (PRILOSEC) 20 MG capsule Take 20 mg by mouth daily.      Marland Kitchen telmisartan (MICARDIS) 40 MG tablet Take 40 mg by mouth daily.      . calcium-vitamin D (OSCAL WITH D) 500-200 MG-UNIT per tablet Take 1 tablet by mouth.      . co-enzyme Q-10 30 MG capsule Take 30 mg by mouth 3 (three) times daily.      Marland Kitchen loratadine (CLARITIN) 10 MG tablet Take 10 mg by mouth daily.       No current facility-administered medications for this visit.    Allergies  Allergen Reactions  . Codeine Sulfate     Vomiting, nausea    Family History  Problem  Relation Age of Onset  . Cancer Father     bladder & pancreatic    History   Social History  . Marital Status: Married    Spouse Name: N/A    Number of Children: N/A  . Years of Education: N/A   Social History Main Topics  . Smoking status: Never Smoker   . Smokeless tobacco: Never Used  . Alcohol Use: Yes     Comment: seldom  . Drug Use: No  . Sexual Activity: None   Other Topics Concern  . None   Social History Narrative  . None    REVIEW OF SYSTEMS - PERTINENT POSITIVES ONLY: Denies breast pain. Denies breast mass. Denies nipple discharge.  EXAM: Filed Vitals:   12/07/13 1016  BP: 135/82  Pulse: 74  Temp: 98.5 F (36.9 C)  Resp: 14    GENERAL: well-developed, well-nourished, no acute distress HEENT: normocephalic; pupils equal and reactive; sclerae clear; dentition good; mucous membranes moist NECK:  Well-healed anterior cervical incision; symmetric on extension; no palpable anterior or posterior cervical lymphadenopathy; no supraclavicular masses; no tenderness CHEST: clear to auscultation bilaterally without rales, rhonchi, or wheezes CARDIAC: regular rate and rhythm without significant murmur; peripheral pulses are full BREAST: Well-healed surgical incisions bilaterally consistent with reduction mammoplasty; area of ecchymosis in the inferior. Areolar area of the right breast consistent with recent  biopsy; palpation of the right breast shows diffusely nodular breast parenchyma with output dominant or discrete mass; right axilla is free of adenopathy.  Left breast shows diffusely nodular breast parenchyma without discrete or dominant mass. Left axilla is free of adenopathy. EXT:  non-tender without edema; no deformity NEURO: no gross focal deficits; no sign of tremor   LABORATORY RESULTS: See [facility] (CHL-Epic) for most recent results  RADIOLOGY RESULTS: See [facility] (CHL-Epic) for most recent results  IMPRESSION: #1 invasive ductal  carcinoma, right breast, 6 mm #2 personal history of thyroid nodules  PLAN: The patient and I reviewed the radiographic findings and the pathology results. I have recommended partial mastectomy with wire localization. We will perform concurrent sentinel lymph node biopsy. Risk and benefits are discussed with the patient. We will proceed as soon as possible at a time convenient for the patient.  The risks and benefits of the procedure have been discussed at length with the patient.  The patient understands the proposed procedure, potential alternative treatments, and the course of recovery to be expected.  All of the patient's questions have been answered at this time.  The patient wishes to proceed with surgery.  Earnstine Regal, MD, [facility]  Primary Care Physician: [name], MD

## 2013-12-12 NOTE — Anesthesia Preprocedure Evaluation (Signed)
Anesthesia Evaluation  Patient identified by MRN, date of birth, ID band Patient awake    Reviewed: Allergy & Precautions, H&P , NPO status , Patient's Chart, lab work & pertinent test results, reviewed documented beta blocker date and time   History of Anesthesia Complications (+) DIFFICULT AIRWAY and history of anesthetic complications  Airway Mallampati: II TM Distance: >3 FB Neck ROM: full    Dental   Pulmonary neg pulmonary ROS,  breath sounds clear to auscultation        Cardiovascular hypertension, Pt. on medications Rhythm:regular     Neuro/Psych negative neurological ROS  negative psych ROS   GI/Hepatic Neg liver ROS, GERD-  Medicated and Controlled,  Endo/Other  negative endocrine ROS  Renal/GU negative Renal ROS  negative genitourinary   Musculoskeletal   Abdominal   Peds  Hematology negative hematology ROS (+)   Anesthesia Other Findings See surgeon's H&P   Reproductive/Obstetrics negative OB ROS                           Anesthesia Physical Anesthesia Plan  ASA: II  Anesthesia Plan: General   Post-op Pain Management:    Induction: Intravenous  Airway Management Planned: LMA  Additional Equipment:   Intra-op Plan:   Post-operative Plan:   Informed Consent: I have reviewed the patients History and Physical, chart, labs and discussed the procedure including the risks, benefits and alternatives for the proposed anesthesia with the patient or authorized representative who has indicated his/her understanding and acceptance.   Dental Advisory Given  Plan Discussed with: CRNA and Surgeon  Anesthesia Plan Comments:         Anesthesia Quick Evaluation

## 2013-12-12 NOTE — Transfer of Care (Signed)
Immediate Anesthesia Transfer of Care Note  Patient: Martha Evans  Procedure(s) Performed: Procedure(s): PARTIAL MASTECTOMY WITH NEEDLE LOCALIZATION AND AXILLARY SENTINEL LYMPH NODE BX (Right)  Patient Location: PACU  Anesthesia Type:General  Level of Consciousness: awake and alert   Airway & Oxygen Therapy: Patient Spontanous Breathing and Patient connected to face mask oxygen  Post-op Assessment: Report given to PACU RN and Post -op Vital signs reviewed and stable  Post vital signs: Reviewed and stable  Complications: No apparent anesthesia complications

## 2013-12-12 NOTE — Discharge Instructions (Signed)
°  Post Anesthesia Home Care Instructions ° °Activity: °Get plenty of rest for the remainder of the day. A responsible adult should stay with you for 24 hours following the procedure.  °For the next 24 hours, DO NOT: °-Drive a car °-Operate machinery °-Drink alcoholic beverages °-Take any medication unless instructed by your physician °-Make any legal decisions or sign important papers. ° °Meals: °Start with liquid foods such as gelatin or soup. Progress to regular foods as tolerated. Avoid greasy, spicy, heavy foods. If nausea and/or vomiting occur, drink only clear liquids until the nausea and/or vomiting subsides. Call your physician if vomiting continues. ° °Special Instructions/Symptoms: °Your throat may feel dry or sore from the anesthesia or the breathing tube placed in your throat during surgery. If this causes discomfort, gargle with warm salt water. The discomfort should disappear within 24 hours. °Call your surgeon if you experience:  ° °1.  Fever over 101.0. °2.  Inability to urinate. °3.  Nausea and/or vomiting. °4.  Extreme swelling or bruising at the surgical site. °5.  Continued bleeding from the incision. °6.  Increased pain, redness or drainage from the incision. °7.  Problems related to your pain medication. °

## 2013-12-12 NOTE — Anesthesia Postprocedure Evaluation (Signed)
Anesthesia Post Note  Patient: Martha Evans  Procedure(s) Performed: Procedure(s) (LRB): PARTIAL MASTECTOMY WITH NEEDLE LOCALIZATION AND AXILLARY SENTINEL LYMPH NODE BX (Right)  Anesthesia type: General  Patient location: PACU  Post pain: Pain level controlled  Post assessment: Patient's Cardiovascular Status Stable  Last Vitals:  Filed Vitals:   12/12/13 1444  BP:   Pulse: 80  Temp:   Resp: 19    Post vital signs: Reviewed and stable  Level of consciousness: alert  Complications: No apparent anesthesia complications

## 2013-12-12 NOTE — Anesthesia Procedure Notes (Signed)
Procedure Name: LMA Insertion Date/Time: 12/12/2013 12:21 PM Performed by: Lyndee Leo Pre-anesthesia Checklist: Patient identified, Emergency Drugs available, Suction available and Patient being monitored Patient Re-evaluated:Patient Re-evaluated prior to inductionOxygen Delivery Method: Circle System Utilized Preoxygenation: Pre-oxygenation with 100% oxygen Intubation Type: IV induction Ventilation: Mask ventilation without difficulty LMA: LMA inserted LMA Size: 4.0 Number of attempts: 1 Airway Equipment and Method: bite block Placement Confirmation: positive ETCO2 Tube secured with: Tape Dental Injury: Teeth and Oropharynx as per pre-operative assessment

## 2013-12-12 NOTE — Brief Op Note (Signed)
12/12/2013  1:27 PM  PATIENT:  Martha Evans  67 y.o. female  PRE-OPERATIVE DIAGNOSIS:  right breast invasive ductal carcinoma  POST-OPERATIVE DIAGNOSIS:  same  PROCEDURE:  Right partial mastectomy with wire-localization  SURGEON:  Surgeon(s) and Role:    * Earnstine Regal, MD - Primary  ANESTHESIA:   general  EBL:  Total I/O In: 1300 [I.V.:1300] Out: -   BLOOD ADMINISTERED:none  DRAINS: none   LOCAL MEDICATIONS USED:  MARCAINE     SPECIMEN:  Excision  DISPOSITION OF SPECIMEN:  PATHOLOGY  COUNTS:  YES  TOURNIQUET:  * No tourniquets in log *  DICTATION: .Other Dictation: Dictation Number [id number]  PLAN OF CARE: Discharge to home after PACU  PATIENT DISPOSITION:  PACU - hemodynamically stable.   Delay start of Pharmacological VTE agent (>24hrs) due to surgical blood loss or risk of bleeding: yes  Earnstine Regal, MD, [facility] Office: [phone]

## 2013-12-13 ENCOUNTER — Telehealth: Payer: Self-pay | Admitting: *Deleted

## 2013-12-13 ENCOUNTER — Encounter (HOSPITAL_BASED_OUTPATIENT_CLINIC_OR_DEPARTMENT_OTHER): Payer: Self-pay | Admitting: Surgery

## 2013-12-13 NOTE — Op Note (Signed)
[name], Evans                 ACCOUNT NO.:  [id number]  MEDICAL RECORD NO.:  [id number]  LOCATION:                               FACILITY:  [facility]  PHYSICIAN:  Earnstine Regal, MD      DATE OF BIRTH:  July 12, 1946  DATE OF PROCEDURE:  12/12/2013                               OPERATIVE REPORT   PREOPERATIVE DIAGNOSIS:  Invasive ductal carcinoma, right breast.  POSTOPERATIVE DIAGNOSIS:  Invasive ductal carcinoma, right breast.  PROCEDURE:  Right partial mastectomy with wire localization.  SURGEON:  Earnstine Regal, MD, FACS  ANESTHESIA:  General per Jessy Oto. Albertina Parr, MD  ESTIMATED BLOOD LOSS:  Minimal.  PREPARATION:  ChloraPrep.  COMPLICATIONS:  None.  INDICATIONS:  The patient is a 68 year old female who underwent routine screening mammography and was found to have an abnormality in the right breast.  Additional views and right breast ultrasound showed a 6-mm nodule in the inferior outer quadrant of the right breast.  This was associated with microcalcifications.  This was a new finding compared with her mammogram of 1 year ago.  The patient has a distant history of reduction mammoplasty.  She now comes to Surgery for excision with wire localization and sentinel lymph node biopsy.  BODY OF REPORT:  Procedure was done in OR #8 at the [facility].  The patient had been previously injected with technetium sestamibi.  The patient was brought to the operating room, placed in the supine position on the operating room table.  Following administration of general anesthesia, the patient was examined with the Neoprobe. There was activity at the nipple-areolar complex.  However, there is no activity in the right axilla.  The axilla was scanned completely as is the lateral portion of the breast.  No significant radioactive activity is identified.  Decision was made to proceed with a partial mastectomy.  Guidewire had already previously been placed in the  inferolateral portion of the right breast.  The patient was prepped and draped in the usual aseptic fashion.  After ascertaining that an adequate level of anesthesia had been maintained, a curvilinear incision was made at the site of guidewire insertion in the lateral inferior right breast.  Dissection was carried into the subcutaneous tissues using the electrocautery for hemostasis. A core of breast tissue was excised around the guidewire.  The guidewire was followed to the chest wall where it turns medially.  At this point, the previously placed metal clip from mammography is identified and extracted.  Because of identifying the clip, a wider surgical margin was then pursued circumferentially.  Dissection was carried medially down to the chest wall.  The breast tissue was excised off the chest wall with the tip of the guidewire visible.  A radial margin was achieved around the area in question.  The entire specimen was then excised.  The area where the marking clip was identified is reapproximated with interrupted nylon sutures, so as to establish an actual surgical margin on pathologic examination.  Specimen was then oriented and painted on six sides according to the usual protocol.  Fixative was sprayed on the specimen.  A specimen mammogram was performed and reviewed  by Radiology.  The marking clip was also submitted with the specimen.  The specimen was submitted to Pathology for review.  Wound was inspected for good hemostasis which was achieved with the electrocautery.  Margins of resection are marked with medium Ligaclips on the chest wall.  Subcutaneous tissues were closed with interrupted 3- 0 Vicryl sutures.  Skin was closed with a running 4-0 Monocryl subcuticular suture.  Wound was washed and dried and benzoin, Steri- Strips were applied. Neoprobe was again used to scan the lateral and superior portion of the breast as well as the right axilla.  There was no  significant radioactivity identified in the axilla.  Decision was made not to proceed with sentinel lymph node biopsy and not to inject Lymphazurin blue during this procedure.  Dressings were applied to the breasts.  The patient was awakened from anesthesia and brought to the recovery room.  The patient tolerated the procedure well.   Earnstine Regal, MD, [facility] Office: [phone]    TMG/MEDQ  D:  12/12/2013  T:  12/13/2013  Job:  [id number]  cc:   Deniece Portela, M.D. [name], MD

## 2013-12-13 NOTE — Op Note (Deleted)
Martha Evans, Martha Evans                 ACCOUNT NO.:  [id number]  MEDICAL RECORD NO.:  [id number]  LOCATION:                               FACILITY:  [facility]  PHYSICIAN:  Earnstine Regal, MD      DATE OF BIRTH:  July 12, 1946  DATE OF PROCEDURE:  12/12/2013                               OPERATIVE REPORT   PREOPERATIVE DIAGNOSIS:  Invasive ductal carcinoma, right breast.  POSTOPERATIVE DIAGNOSIS:  Invasive ductal carcinoma, right breast.  PROCEDURE:  Right partial mastectomy with wire localization.  SURGEON:  Earnstine Regal, MD, FACS  ANESTHESIA:  General per Jessy Oto. Albertina Parr, MD  ESTIMATED BLOOD LOSS:  Minimal.  PREPARATION:  ChloraPrep.  COMPLICATIONS:  None.  INDICATIONS:  The patient is a 68 year old female who underwent routine screening mammography and was found to have an abnormality in the right breast.  Additional views and right breast ultrasound showed a 6-mm nodule in the inferior outer quadrant of the right breast.  This was associated with microcalcifications.  This was a new finding compared with her mammogram of 1 year ago.  The patient has a distant history of reduction mammoplasty.  She now comes to Surgery for excision with wire localization and sentinel lymph node biopsy.  BODY OF REPORT:  Procedure was done in OR #8 at the [facility].  The patient had been previously injected with technetium sestamibi.  The patient was brought to the operating room, placed in the supine position on the operating room table.  Following administration of general anesthesia, the patient was examined with the Neoprobe. There was activity at the nipple-areolar complex.  However, there is no activity in the right axilla.  The axilla was scanned completely as is the lateral portion of the breast.  No significant radioactive activity is identified.  Decision was made to proceed with a partial mastectomy.  Guidewire had already previously been placed in the  inferolateral portion of the right breast.  The patient was prepped and draped in the usual aseptic fashion.  After ascertaining that an adequate level of anesthesia had been maintained, a curvilinear incision was made at the site of guidewire insertion in the lateral inferior right breast.  Dissection was carried into the subcutaneous tissues using the electrocautery for hemostasis. A core of breast tissue was excised around the guidewire.  The guidewire was followed to the chest wall where it turns medially.  At this point, the previously placed metal clip from mammography is identified and extracted.  Because of identifying the clip, a wider surgical margin was then pursued circumferentially.  Dissection was carried medially down to the chest wall.  The breast tissue was excised off the chest wall with the tip of the guidewire visible.  A radial margin was achieved around the area in question.  The entire specimen was then excised.  The area where the marking clip was identified is reapproximated with interrupted nylon sutures, so as to establish an actual surgical margin on pathologic examination.  Specimen was then oriented and painted on six sides according to the usual protocol.  Fixative was sprayed on the specimen.  A specimen mammogram was performed and reviewed  by Radiology.  The marking clip was also submitted with the specimen.  The specimen was submitted to Pathology for review.  Wound was inspected for good hemostasis which was achieved with the electrocautery.  Margins of resection are marked with medium Ligaclips on the chest wall.  Subcutaneous tissues were closed with interrupted 3- 0 Vicryl sutures.  Skin was closed with a running 4-0 Monocryl subcuticular suture.  Wound was washed and dried and benzoin, Steri- Strips were applied. Neoprobe was again used to scan the lateral and superior portion of the breast as well as the right axilla.  There was no  significant radioactivity identified in the axilla.  Decision was made not to proceed with sentinel lymph node biopsy and not to inject Lymphazurin blue during this procedure.  Dressings were applied to the breasts.  The patient was awakened from anesthesia and brought to the recovery room.  The patient tolerated the procedure well.   [name] M. Quavion Boule, MD, FACS [facility] Office: [phone]    TMG/MEDQ  D:  12/12/2013  T:  12/13/2013  Job:  [id number]  cc:   Thomas E. Lawrence, M.D. Donald Fisher, MD 

## 2013-12-13 NOTE — Telephone Encounter (Signed)
Received appt date and time from Dr. Humphrey Rolls.  Called and left a message for the pt to return my call so I can schedule her a med onc appt.

## 2013-12-15 ENCOUNTER — Other Ambulatory Visit (INDEPENDENT_AMBULATORY_CARE_PROVIDER_SITE_OTHER): Payer: Self-pay

## 2013-12-15 ENCOUNTER — Telehealth (INDEPENDENT_AMBULATORY_CARE_PROVIDER_SITE_OTHER): Payer: Self-pay | Admitting: Surgery

## 2013-12-15 ENCOUNTER — Telehealth: Payer: Self-pay | Admitting: *Deleted

## 2013-12-15 DIAGNOSIS — C50919 Malignant neoplasm of unspecified site of unspecified female breast: Secondary | ICD-10-CM

## 2013-12-15 DIAGNOSIS — C50911 Malignant neoplasm of unspecified site of right female breast: Secondary | ICD-10-CM

## 2013-12-15 NOTE — Telephone Encounter (Signed)
Orders have been placed for Onc and Rad Onc and sent msg requesting appts to Welda and Lattie Haw at [facility].

## 2013-12-15 NOTE — Telephone Encounter (Signed)
Telephone call with pathology results.  Will arrange consults with medical oncology and radiation oncology.  Earnstine Regal, MD, [facility] Office: [phone]

## 2013-12-15 NOTE — Telephone Encounter (Signed)
Received appt date and time from Dr. Humphrey Rolls and I called and confirmed 12/21/13 appt w/ pt.  Mailed before appt letter, welcome packet & intake form to pt.  Emailed Santiago Glad for [employer].  Emailed Caren Griffins at [facility] to make her aware. Took paperwork to Med Rec for chart.

## 2013-12-20 ENCOUNTER — Ambulatory Visit (INDEPENDENT_AMBULATORY_CARE_PROVIDER_SITE_OTHER): Payer: Medicare Other | Admitting: Surgery

## 2013-12-20 ENCOUNTER — Encounter (INDEPENDENT_AMBULATORY_CARE_PROVIDER_SITE_OTHER): Payer: Self-pay | Admitting: Surgery

## 2013-12-20 ENCOUNTER — Other Ambulatory Visit: Payer: Self-pay | Admitting: *Deleted

## 2013-12-20 VITALS — BP 124/78 | HR 77 | Temp 98.1°F | Resp 16 | Ht 66.0 in | Wt 184.0 lb

## 2013-12-20 DIAGNOSIS — C50511 Malignant neoplasm of lower-outer quadrant of right female breast: Secondary | ICD-10-CM

## 2013-12-20 DIAGNOSIS — C50519 Malignant neoplasm of lower-outer quadrant of unspecified female breast: Secondary | ICD-10-CM

## 2013-12-20 NOTE — Patient Instructions (Signed)
  COCOA BUTTER & VITAMIN E CREAM  ([employer] or other brand)  Apply cocoa butter/vitamin E cream to your incision 2 - 3 times daily.  Massage cream into incision for one minute with each application.  Use sunscreen (50 SPF or higher) for first 6 months after surgery if area is exposed to sun.  You may substitute Mederma or other scar reducing creams as desired.   

## 2013-12-20 NOTE — Progress Notes (Signed)
General Surgery [facility]  Chief Complaint  Patient presents with  . Routine Post Op    right partial mastectomy 12/12/2013    HISTORY: Patient is 68 year old female underwent right partial mastectomy 12/12/2013. Final pathology shows invasive ductal carcinoma, metaplastic. During the procedure the nuclear medicine isotope failed to transfer to the axilla and sentinel lymph node biopsy was not possible. Patient returns today for wound check.  EXAM: Surgical incision the lower outer quadrant of right breast is healing uneventfully. Steri-Strips are removed in the office today. There is moderate ecchymosis. There is a moderate seroma. There is no sign of infection.  IMPRESSION: Invasive ductal carcinoma, status post right partial mastectomy with wire localization, negative surgical margins  PLAN: Patient is provided with copy of her pathology report. Her case will be discussed at the breast cancer conference tomorrow morning. She has an appointment to see medical oncology later tomorrow. She has an appointment to see radiation oncology on March 26.  Usual wound care instructions are given. Patient will return for wound check in 6 weeks.  Earnstine Regal, MD, [facility]   Visit Diagnoses: 1. Breast cancer of lower-outer quadrant of right female breast

## 2013-12-21 ENCOUNTER — Ambulatory Visit (HOSPITAL_BASED_OUTPATIENT_CLINIC_OR_DEPARTMENT_OTHER): Payer: Medicare Other | Admitting: Oncology

## 2013-12-21 ENCOUNTER — Ambulatory Visit (HOSPITAL_BASED_OUTPATIENT_CLINIC_OR_DEPARTMENT_OTHER): Payer: Medicare Other

## 2013-12-21 ENCOUNTER — Other Ambulatory Visit: Payer: Medicare Other

## 2013-12-21 ENCOUNTER — Encounter: Payer: Self-pay | Admitting: Oncology

## 2013-12-21 ENCOUNTER — Encounter: Payer: Self-pay | Admitting: *Deleted

## 2013-12-21 ENCOUNTER — Other Ambulatory Visit: Payer: Self-pay | Admitting: Radiation Oncology

## 2013-12-21 VITALS — BP 142/78 | HR 84 | Temp 97.8°F | Resp 18 | Ht 66.0 in | Wt 186.6 lb

## 2013-12-21 DIAGNOSIS — C50511 Malignant neoplasm of lower-outer quadrant of right female breast: Secondary | ICD-10-CM

## 2013-12-21 DIAGNOSIS — C50519 Malignant neoplasm of lower-outer quadrant of unspecified female breast: Secondary | ICD-10-CM

## 2013-12-21 DIAGNOSIS — C50919 Malignant neoplasm of unspecified site of unspecified female breast: Secondary | ICD-10-CM

## 2013-12-21 MED ORDER — LORAZEPAM 0.5 MG PO TABS: 0.5000 mg | ORAL_TABLET | Freq: Four times a day (QID) | ORAL | Status: DC | PRN

## 2013-12-21 MED ORDER — UNABLE TO FIND: 1.0000 "application " | Status: DC | PRN

## 2013-12-21 MED ORDER — ONDANSETRON HCL 8 MG PO TABS: 8.0000 mg | ORAL_TABLET | Freq: Two times a day (BID) | ORAL | Status: DC

## 2013-12-21 MED ORDER — DEXAMETHASONE 4 MG PO TABS: 8.0000 mg | ORAL_TABLET | Freq: Two times a day (BID) | ORAL | Status: DC

## 2013-12-21 MED ORDER — LIDOCAINE-PRILOCAINE 2.5-2.5 % EX CREA: TOPICAL_CREAM | CUTANEOUS | Status: DC | PRN

## 2013-12-21 MED ORDER — PROCHLORPERAZINE MALEATE 10 MG PO TABS: 10.0000 mg | ORAL_TABLET | Freq: Four times a day (QID) | ORAL | Status: DC | PRN

## 2013-12-21 NOTE — Progress Notes (Signed)
Checked in new patient with no financial issues. She was late and per Lattie Haw dr Humphrey Rolls said to see her 1st.

## 2013-12-21 NOTE — Patient Instructions (Signed)
We discussed chemotherapy to help prevent the cancer from coming back  We discussed Dr. Harlow Asa putting in a port a cath to give chemotherapy.  We will plan to begin chemotherapy on 01/16/14

## 2013-12-21 NOTE — Progress Notes (Signed)
Completed chart, labs ordered, added to spreadsheet & placed in Dr. Laurelyn Sickle box.

## 2013-12-21 NOTE — Progress Notes (Signed)
Location of Breast Cancer:Invasive ductal carcinoma right breast  Histology per Pathology Report: 12/12/2013 FINAL DIAGNOSIS Diagnosis Breast, lumpectomy, Right - INVASIVE DUCTAL CARCINOMA, METAPLASTIC ([employer] PRODUCING) SUBTYPE SEE COMMENT. - NEGATIVE FOR LYMPH VASCULAR INVASION. - INVASIVE TUMOR IS 0.5CM FROM NEAREST MARGIN (MEDIAL). - SEE TUMOR SYNOPTIC TEMPLATE BELOW. 1 of  Receptor Status: ER(+), PR (+), Her2-neu no amplification  Did patient present with symptoms (if so, please note symptoms) or was this found on screening mammography?:Mass found on screening mammogram 12/01/2013  Past/Anticipated interventions by surgeon, if RJG:YLUDA partial mastectomy on 12/12/2013.  Past/Anticipated interventions by medical oncology, if any: Chemotherapy to start January 16, 2014  Lymphedema issues, if any:No  Pain issues, if any:No  SAFETY ISSUES:  Prior radiation? No  Pacemaker/ICD?No  Possible current pregnancy?No  Is the patient on methotrexate?No  Current Complaints / other details:Married i 2014.No children.Menarche age 50 or 19.Total hysterectomy.Took HRT about 10 years (vivelle patch)    Arlyss Repress, RN 12/21/2013,4:33 PM

## 2013-12-21 NOTE — Progress Notes (Signed)
Martha Evans [id number] Nov 08, 1945 68 y.o. 12/21/2013 4:51 PM  CC  [name], MD [address] [place] [address] Dr. Armandina Gemma Dr. Thea Silversmith  REASON FOR CONSULTATION:  68 year old female with new diagnosis of metaplastic right breast cancer (stage I). Patient is seen in medical oncology for discussion of treatment options.  STAGE:   Breast cancer of lower-outer quadrant of right female breast   Primary site: Breast (Right)   Staging method: AJCC 7th Edition   Pathologic: Stage IA (T1, NX, cM0) signed by [name], MD on 12/21/2013  4:49 PM   Summary: Stage IA (T1, NX, cM0)  REFERRING PHYSICIAN: Dr. Armandina Gemma  HISTORY OF PRESENT ILLNESS:  Martha Evans is a 68 y.o. female.  Would medical history significant for hypertension gastroesophageal reflux disease. Patient underwent bilateral breast reductions in the 1980s. In February 2015 she had screening mammograms performed that showed a mass in the central right breast. She had ultrasound performed which revealed a 6 x 4 x 6 mm high-grade calcifications. Biopsy of these calcifications revealed invasive ductal carcinoma. On 12/12/2013 patient underwent a lumpectomy without sentinel lymph node biopsy. 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 Tumor was ER +2% PR +2% HER-2/neu negative with an elevated Ki-67 of 84%. Of note patient undergo a sentinel lymph node biopsy because of previous breast reduction. She is seen today with medical oncology for discussion of treatment options. She will also be seen by radiation oncology. She has some breast soreness otherwise without any problems.   Past Medical History: Past Medical History  Diagnosis Date  . Hypertension   . Allergy   . Wears hearing aid     right  . GERD (gastroesophageal reflux disease)   . Complication of anesthesia     was hard to intubate with thyroid surgery-99  . Difficult intubation  1999    when had thyroid lobectomy    Past Surgical History: Past Surgical History  Procedure Laterality Date  . Thyroid lobectomy  1999    right  . Rotator cuff repair  2006    right shoulder  . Breast reduction surgery Bilateral     1980's  . Abdominal hysterectomy  1999  . Tonsillectomy    . Appendectomy    . Colonoscopy    . Partial mastectomy with needle localization and axillary sentinel lymph node bx Right 12/12/2013    Procedure: PARTIAL MASTECTOMY WITH NEEDLE LOCALIZATION AND AXILLARY SENTINEL LYMPH NODE BX;  Surgeon: Earnstine Regal, MD;  Location: [facility];  Service: General;  Laterality: Right;    Family History: Family History  Problem Relation Age of Onset  . Cancer Father     bladder & pancreatic    Social History History  Substance Use Topics  . Smoking status: Never Smoker   . Smokeless tobacco: Never Used  . Alcohol Use: Yes     Comment: seldom    Allergies: Allergies  Allergen Reactions  . Codeine Sulfate Nausea And Vomiting    Current Medications: Current Outpatient Prescriptions  Medication Sig Dispense Refill  . calcium-vitamin D (OSCAL WITH D) 500-200 MG-UNIT per tablet Take 1 tablet by mouth.      . loratadine (CLARITIN) 10 MG tablet Take 10 mg by mouth daily.      Marland Kitchen omeprazole (PRILOSEC) 20 MG capsule Take 20 mg by mouth daily.      Marland Kitchen telmisartan (MICARDIS) 40 MG tablet Take 40 mg by mouth  daily.       No current facility-administered medications for this visit.    OB/GYN History: menarche at 63, menopause at 77 (surgical) HRT x 10 years, GxP0  Fertility Discussion: N/A Prior History of Cancer: no  Health Maintenance:  Colonoscopy yes Bone Density 2013  Last PAP smear no  ECOG PERFORMANCE STATUS: 0 - Asymptomatic  Genetic Counseling/testing: no  REVIEW OF SYSTEMS:  A comprehensive review of systems was negative.  PHYSICAL EXAMINATION: Blood pressure 142/78, pulse 84, temperature 97.8 F (36.6 C),  temperature source Oral, resp. rate 18, height 5' 6"  (1.676 m), weight 186 lb 9.6 oz (84.641 kg).  General:  well-nourished in no acute distress.  Eyes:  no scleral icterus.  ENT:  There were no oropharyngeal lesions.  Neck was without thyromegaly.  Lymphatics:  Negative cervical, supraclavicular or axillary adenopathy.  Respiratory: lungs were clear bilaterally without wheezing or crackles.  Cardiovascular:  Regular rate and rhythm, S1/S2, without murmur, rub or gallop.  There was no pedal edema.  GI:  abdomen was soft, flat, nontender, nondistended, without organomegaly.  Muscoloskeletal:  no spinal tenderness of palpation of vertebral spine.  Skin exam was without echymosis, petichae.  Neuro exam was nonfocal.  Patient was able to get on and off exam table without assistance.  Gait was normal.  Patient was alerted and oriented.  Attention was good.   Language was appropriate.  Mood was normal without depression.  Speech was not pressured.  Thought content was not tangential.   Breasts: right breast normal without mass, skin or nipple changes or axillary nodes with well healing lumpectomy scar, left breast normal without mass, skin or nipple changes or axillary nodes.   STUDIES/RESULTS: Nm Sentinel Node Inj-no Rpt (breast)  12/12/2013   CLINICAL DATA: invasive ductal carcinoma right breast   Sulfur colloid was injected intradermally by the nuclear medicine  technologist for breast cancer sentinel node localization.    Mm Digital Diagnostic Unilat R  12/01/2013   CLINICAL DATA:  New indeterminate 6 mm mass in the slight outer right breast which was biopsied under ultrasound guidance earlier same date.  EXAM: POST-BIOPSY CLIP PLACEMENT RIGHT DIAGNOSTIC MAMMOGRAM  COMPARISON:  Previous exams.  FINDINGS: Films are performed following ultrasound guided biopsy of a new indeterminate 6 mm mass in the slight outer right breast. The ribbon tissue marker clip is appropriately positioned at the inferior edge of  the mass. Expected post biopsy hemorrhage is present without evidence of hematoma  IMPRESSION: Appropriate positioning of the ribbon tissue marker clip at the inferior edge of the 6 mm mass in the slight outer right breast.  Final Assessment: Post Procedure Mammograms for Marker Placement   Electronically Signed   By: Evangeline Dakin M.D.   On: 12/01/2013 13:31   Mm Digital Diagnostic Unilat R  12/01/2013   CLINICAL DATA:  Callback from screening mammography, possible right breast mass.  EXAM: DIGITAL DIAGNOSTIC  RIGHT MAMMOGRAM  ULTRASOUND RIGHT BREAST  COMPARISON:  Mammography 11/14/2013, 11/05/2012, dating back to 10/15/2007. No prior right breast ultrasound.  ACR Breast Density Category b: There are scattered areas of fibroglandular density.  FINDINGS: Spot compression CC and MLO views of the area of concern in the central right breast, middle 1/3, were obtained. These confirm a partially obscured mass with associated faint microcalcifications but no associated architectural distortion. The mass was not present on the prior mammograms.  On physical exam, there is no palpable abnormality in the right breast. A surgical scar from previous breast  reduction is present.  Ultrasound is performed, showing an oval-shaped mass with irregularity along its posterior border at the 8:30 o'clock position of the right breast approximately 4 cm from the nipple, measuring approximately 6 x 4 x 6 mm, containing a microcalcification. There is no associated acoustic shadowing, but there is internal blood flow on power Doppler evaluation.  IMPRESSION: Indeterminate 6 mm mass at the 8:30 o'clock position of the right breast approximately 4 cm from the nipple.  RECOMMENDATION: Ultrasound-guided core needle biopsy of the indeterminate mass is recommended. The procedure was discussed with the patient in detail. She has agreed to proceed. This was performed subsequently and is reported separately.  I have discussed the findings and  recommendations with the patient. Results were also provided in writing at the conclusion of the visit.  BI-RADS CATEGORY  4: Suspicious abnormality - biopsy should be considered.   Electronically Signed   By: Evangeline Dakin M.D.   On: 12/01/2013 10:59   US Breast Ltd Uni Right Inc Axilla  12/01/2013   CLINICAL DATA:  Callback from screening mammography, possible right breast mass.  EXAM: DIGITAL DIAGNOSTIC  RIGHT MAMMOGRAM  ULTRASOUND RIGHT BREAST  COMPARISON:  Mammography 11/14/2013, 11/05/2012, dating back to 10/15/2007. No prior right breast ultrasound.  ACR Breast Density Category b: There are scattered areas of fibroglandular density.  FINDINGS: Spot compression CC and MLO views of the area of concern in the central right breast, middle 1/3, were obtained. These confirm a partially obscured mass with associated faint microcalcifications but no associated architectural distortion. The mass was not present on the prior mammograms.  On physical exam, there is no palpable abnormality in the right breast. A surgical scar from previous breast reduction is present.  Ultrasound is performed, showing an oval-shaped mass with irregularity along its posterior border at the 8:30 o'clock position of the right breast approximately 4 cm from the nipple, measuring approximately 6 x 4 x 6 mm, containing a microcalcification. There is no associated acoustic shadowing, but there is internal blood flow on power Doppler evaluation.  IMPRESSION: Indeterminate 6 mm mass at the 8:30 o'clock position of the right breast approximately 4 cm from the nipple.  RECOMMENDATION: Ultrasound-guided core needle biopsy of the indeterminate mass is recommended. The procedure was discussed with the patient in detail. She has agreed to proceed. This was performed subsequently and is reported separately.  I have discussed the findings and recommendations with the patient. Results were also provided in writing at the conclusion of the visit.   BI-RADS CATEGORY  4: Suspicious abnormality - biopsy should be considered.   Electronically Signed   By: Evangeline Dakin M.D.   On: 12/01/2013 10:59   [place] Rt Breast Bx W Loc Dev 1st Lesion Img Bx Spec US Guide  12/02/2013   ADDENDUM REPORT: 12/02/2013 12:58  ADDENDUM: PATHOLOGY: Invasive ductal carcinoma.  CONCORDANT:  Yes.  I discussed these results and the recommendations below with the patient by telephone on 12/02/2013 at 1045 hr. All of her questions were answered. She denies significant pain or bleeding at the biopsy site.  RECOMMENDATION: Surgical consultation for excision. An appointment has been made with Dr. Harlow Asa of [facility] on March 11 at 10 o'clock a.m.   Electronically Signed   By: Evangeline Dakin M.D.   On: 12/02/2013 12:58   12/02/2013   CLINICAL DATA:  Indeterminate 6 mm mass identified on recent screening mammogram and diagnostic workup earlier today.  EXAM: ULTRASOUND GUIDED RIGHT BREAST CORE NEEDLE  BIOPSY WITH VACUUM ASSIST  COMPARISON:  Previous exams.  PROCEDURE: I met with the patient and we discussed the procedure of ultrasound-guided biopsy, including benefits and alternatives. We discussed the high likelihood of a successful procedure. We discussed the risks of the procedure including infection, bleeding, tissue injury, clip migration, and inadequate sampling. Informed written consent was given. The usual time-out protocol was performed immediately prior to the procedure.  Using sterile technique and 2% Lidocaine as local anesthetic, under direct ultrasound visualization, a 12 gauge vacuum-assisteddevice was used to perform biopsy of the 6 mm mass at the 8:30 o'clock position of the right breast approximately 4 cm from the nipple using an inferior approach. At the conclusion of the procedure, a ribbon shaped tissue marker clip was deployed into the biopsy cavity. Follow-up 2-view mammogram was performed and dictated separately.  IMPRESSION: Ultrasound-guided biopsy of an  indeterminate 6 mm mass in the outer right breast. No apparent complications.  Electronically Signed: By: Evangeline Dakin M.D. On: 12/01/2013 13:26   Mm Rt Plc Breast Loc Dev   1st Lesion  Inc Mammo Guide  12/12/2013   CLINICAL DATA:  Recent diagnosis of a 6 mm invasive ductal carcinoma in the right breast. Localization prior to right breast lumpectomy.  EXAM: NEEDLE LOCALIZATION OF THE RIGHT BREAST WITH MAMMO GUIDANCE  COMPARISON:  Previous exams.  FINDINGS: Patient presents for needle localization prior to right breast lumpectomy. I met with the patient and we discussed the procedure of needle localization including benefits and alternatives. We discussed the high likelihood of a successful procedure. We discussed the risks of the procedure, including infection, bleeding, tissue injury, and further surgery. Informed, written consent was given. The usual time-out protocol was performed immediately prior to the procedure.  Using mammographic guidance, sterile technique, 2% lidocaine and a 9 cm modified Kopans needle, the 6 mm mass and the ribbon tissue marker clip were localized using a lateral approach. The films were marked for Dr. Harlow Asa.  Specimen radiograph was performed at [place] and confirms that the mass is present in the tissue sample. (The ribbon shaped tissue marker clip fell out of the sample during removal). The specimen was marked for pathology at [facility] on the grid. This was discussed with the nurse in the operating room on 12/12/2013 at 1305 hr.  IMPRESSION: Needle localization right breast. No apparent complications.   Electronically Signed   By: Evangeline Dakin M.D.   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 Tumor was ER +2% PR +2% HER-2/neu negative. She is seen in medical oncology for discussion of treatment options.   #2 We spent the better part of today's hour-long appointment discussing the biology of breast cancer in general, and the specifics of the patient's tumor in particular.Patient and I went over her pathology in detail today. We  discussed significance of triple negative disease. She understands that she will need chemotherapy.  I have recommended we used short course of chemotherapy consisting of Taxotere and Cytoxan given every 3 weeks for a total of 4 cycles. Risks benefits side effects and complications of these drugs were discussed with her very clearly as well as her husband. We would like to begin this as soon as possible. We discussed the logistics of chemotherapy. Once patient completes chemotherapy she will need radiation therapy to the right breast.  #4 we discussed the need for proceeding with a Port-A-Cath placement. We also discussed chemotherapy education class, echocardiogram to monitor patient's heart function.  #5I will plan on seeing the patient back after she has had her port placed, chemotherapy class.     Thank you so much for allowing me to participate in the care of Martha Evans. I will continue to follow up the patient with you and assist in her care.  All questions were answered. The patient knows to call the clinic with any problems, questions or concerns. We can certainly see the patient much sooner if necessary.  I spent 40 minutes counseling the patient face to face. The total time spent in the appointment was 60 minutes.  Marcy Panning, MD Medical/Oncology [facility] [phone]  (beeper) [phone] (Office)  12/21/2013, 4:51 PM

## 2013-12-22 ENCOUNTER — Ambulatory Visit
Admission: RE | Admit: 2013-12-22 | Discharge: 2013-12-22 | Disposition: A | Discharge status: Home / Self Care | Payer: Medicare Other | Source: Ambulatory Visit | Attending: Radiation Oncology | Admitting: Radiation Oncology

## 2013-12-22 ENCOUNTER — Encounter: Payer: Self-pay | Admitting: Oncology

## 2013-12-22 ENCOUNTER — Encounter: Payer: Self-pay | Admitting: *Deleted

## 2013-12-22 ENCOUNTER — Ambulatory Visit: Payer: Medicare Other | Admitting: Radiation Oncology

## 2013-12-22 ENCOUNTER — Telehealth: Payer: Self-pay | Admitting: *Deleted

## 2013-12-22 ENCOUNTER — Encounter: Payer: Self-pay | Admitting: Radiation Oncology

## 2013-12-22 VITALS — BP 118/71 | HR 76 | Temp 98.0°F | Wt 185.8 lb

## 2013-12-22 DIAGNOSIS — C50519 Malignant neoplasm of lower-outer quadrant of unspecified female breast: Secondary | ICD-10-CM | POA: Insufficient documentation

## 2013-12-22 DIAGNOSIS — C50511 Malignant neoplasm of lower-outer quadrant of right female breast: Secondary | ICD-10-CM

## 2013-12-22 NOTE — Telephone Encounter (Signed)
Per 3/25 POF I have scheduled some appts. Unable to schedule all MD/APP appts due to no available. Unable to schedule first time treatment without MD/APP visit. Message sent to MD

## 2013-12-22 NOTE — Progress Notes (Signed)
RECEIVED A FAX FROM [employer] CONCERNING A PRIOR AUTHORIZATION FOR ONDANSETRON. THIS REQUEST WAS PLACED IN THE MANAGED CARE BIN.

## 2013-12-22 NOTE — Addendum Note (Signed)
Encounter addended by: Arlyss Repress, RN on: 12/22/2013  3:12 PM<BR>     Documentation filed: Charges VN

## 2013-12-22 NOTE — Progress Notes (Signed)
Please see the Nurse Progress Note in the MD Initial Consult Encounter for this patient. 

## 2013-12-22 NOTE — Progress Notes (Signed)
Faxed ondansetron pa form to [employer] °

## 2013-12-22 NOTE — Telephone Encounter (Signed)
Patient called with chemo class. Patient aware of date/time. She will pick up calendar then. Patient aware we are waiting for MD appts

## 2013-12-22 NOTE — Progress Notes (Signed)
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 female  who underwent bilateral breast reductions in the 1980s. She underwent a screening mammogram in February of this year which showed a mass in the central right breast. Ultrasound confirmed a 6 x 4 x 6 mm finding with microcalcifications. A biopsy was performed which showed invasive ductal carcinoma. She underwent lumpectomy and sentinel lymph node biopsy on 12/12/2013 which showed a metaplastic invasive ductal carcinoma measuring 0.8 cm with negative margins. The tumor was ER positive at 2% PR positive at 2% HER-2 negative and Ki-67 84%. The sentinel lymph node dye did not not to the axilla likely due to her previous breast reduction. For that reason no sentinel lymph nodes were removed. She is doing well. She has some soreness in her right breast. She has met with medical oncology and chemotherapy has been recommended. She is going to start that soon. She lives in [place] but would like to receive her treatment here. I was asked to see her by Dr. Harlow Asa for my opinion regarding radiation in the management of her newly diagnosed breast cancer. She is married with no children. She had her menarche at 89 and had a hysterectomy many years ago. She took hormone replacement for about 10 years but quit many years ago. She has no previous history of breast cancer. Her father had bladder and pancreatic cancer.Marland Kitchen  PREVIOUS RADIATION  THERAPY: No  PAST MEDICAL HISTORY:  has a past medical history of Hypertension; Allergy; Wears hearing aid; GERD (gastroesophageal reflux disease); Complication of anesthesia; and Difficult intubation (1999).    PAST SURGICAL HISTORY: Past Surgical History  Procedure Laterality Date  . Thyroid lobectomy  1999    right  . Rotator cuff repair  2006    right shoulder  . Breast reduction surgery Bilateral     1980's  . Abdominal hysterectomy  1999  . Tonsillectomy      as child  . Appendectomy      as child  . Colonoscopy    . Partial mastectomy with needle localization and axillary sentinel lymph node bx Right 12/12/2013    Procedure: PARTIAL MASTECTOMY WITH NEEDLE LOCALIZATION AND AXILLARY SENTINEL LYMPH NODE BX;  Surgeon: Earnstine Regal, MD;  Location: [facility];  Service: General;  Laterality: Right;    FAMILY HISTORY:  Family History  Problem Relation Age of Onset  . Cancer Father     bladder & pancreatic    SOCIAL HISTORY:  History  Substance Use Topics  . Smoking status: Never Smoker   . Smokeless tobacco: Never Used  . Alcohol Use: Yes     Comment: seldom    ALLERGIES: Codeine sulfate  MEDICATIONS:  Current Outpatient Prescriptions  Medication Sig Dispense Refill  . calcium-vitamin D (OSCAL WITH D) 500-200 MG-UNIT per tablet Take 1 tablet by mouth.      Marland Kitchen omeprazole (PRILOSEC) 20 MG capsule Take 20 mg  by mouth daily.      Marland Kitchen telmisartan (MICARDIS) 40 MG tablet Take 40 mg by mouth daily.      Marland Kitchen dexamethasone (DECADRON) 4 MG tablet Take 2 tablets (8 mg total) by mouth 2 (two) times daily. Start the day before Taxotere. Then again the day after chemo for 3 days.  30 tablet  1  . lidocaine-prilocaine (EMLA) cream Apply topically as needed.  30 g  6  . loratadine (CLARITIN) 10 MG tablet Take 10 mg by mouth daily.      Marland Kitchen LORazepam (ATIVAN) 0.5 MG tablet Take 1 tablet (0.5 mg total) by mouth every 6 (six) hours as needed (Nausea or vomiting).  30 tablet  0    . ondansetron (ZOFRAN) 8 MG tablet Take 1 tablet (8 mg total) by mouth 2 (two) times daily. Start the day after chemo for 3 days. Then take as needed for nausea or vomiting.  30 tablet  1  . prochlorperazine (COMPAZINE) 10 MG tablet Take 1 tablet (10 mg total) by mouth every 6 (six) hours as needed (Nausea or vomiting).  30 tablet  1  . UNABLE TO FIND Apply 1 application topically as needed. 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    REVIEW OF SYSTEMS:  A 15 point review of systems is documented in the electronic medical record. This was obtained by the nursing staff. However, I reviewed this with the patient to discuss relevant findings and make appropriate changes.  Pertinent items are noted in HPI.  PHYSICAL EXAM:  Filed Vitals:   12/22/13 0915  BP: 118/71  Pulse: 76  Temp: 98 F (36.7 C)  .185 lb 12.8 oz (84.278 kg). She has old inframammary incisions which are healed well. She has some bruising in the lateral aspect of the right breast along with a healing incision. No signs of infection. She has no palpable axillary or superventricular lymph nodes. She is alert minus x3. She has 5 out of 5 strength bilaterally.  LABORATORY DATA:  Lab Results  Component Value Date   HGB 13.6 12/12/2013   Lab Results  Component Value Date   NA 141 12/07/2013   K 3.5* 12/07/2013   CL 103 12/07/2013   CO2 23 12/07/2013   No results found for this basename: ALT, AST, GGT, ALKPHOS, BILITOT     RADIOGRAPHY: Nm Sentinel Node Inj-no Rpt (breast)  12/12/2013   CLINICAL DATA: invasive ductal carcinoma right breast   Sulfur colloid was injected intradermally by the nuclear medicine  technologist for breast cancer sentinel node localization.    Mm Digital Diagnostic Unilat R  12/01/2013   CLINICAL DATA:  New indeterminate 6 mm mass in the slight outer right breast which was biopsied under ultrasound  guidance earlier same date.  EXAM: POST-BIOPSY CLIP PLACEMENT RIGHT DIAGNOSTIC MAMMOGRAM  COMPARISON:  Previous exams.  FINDINGS: Films are performed following ultrasound guided biopsy of a new indeterminate 6 mm mass in the slight outer right breast. The ribbon tissue marker clip is appropriately positioned at the inferior edge of the mass. Expected post biopsy hemorrhage is present without evidence of hematoma  IMPRESSION: Appropriate positioning of the ribbon tissue marker clip at the inferior edge of the 6 mm mass in the slight outer right breast.  Final Assessment: Post Procedure Mammograms for Marker Placement   Electronically Signed   By: Evangeline Dakin M.D.   On: 12/01/2013 13:31  Mm Digital Diagnostic Unilat R  12/01/2013   CLINICAL DATA:  Callback from screening mammography, possible right breast mass.  EXAM: DIGITAL DIAGNOSTIC  RIGHT MAMMOGRAM  ULTRASOUND RIGHT BREAST  COMPARISON:  Mammography 11/14/2013, 11/05/2012, dating back to 10/15/2007. No prior right breast ultrasound.  ACR Breast Density Category b: There are scattered areas of fibroglandular density.  FINDINGS: Spot compression CC and MLO views of the area of concern in the central right breast, middle 1/3, were obtained. These confirm a partially obscured mass with associated faint microcalcifications but no associated architectural distortion. The mass was not present on the prior mammograms.  On physical exam, there is no palpable abnormality in the right breast. A surgical scar from previous breast reduction is present.  Ultrasound is performed, showing an oval-shaped mass with irregularity along its posterior border at the 8:30 o'clock position of the right breast approximately 4 cm from the nipple, measuring approximately 6 x 4 x 6 mm, containing a microcalcification. There is no associated acoustic shadowing, but there is internal blood flow on power Doppler evaluation.  IMPRESSION: Indeterminate 6 mm mass at the 8:30 o'clock  position of the right breast approximately 4 cm from the nipple.  RECOMMENDATION: Ultrasound-guided core needle biopsy of the indeterminate mass is recommended. The procedure was discussed with the patient in detail. She has agreed to proceed. This was performed subsequently and is reported separately.  I have discussed the findings and recommendations with the patient. Results were also provided in writing at the conclusion of the visit.  BI-RADS CATEGORY  4: Suspicious abnormality - biopsy should be considered.   Electronically Signed   By: Evangeline Dakin M.D.   On: 12/01/2013 10:59   US Breast Ltd Uni Right Inc Axilla  12/01/2013   CLINICAL DATA:  Callback from screening mammography, possible right breast mass.  EXAM: DIGITAL DIAGNOSTIC  RIGHT MAMMOGRAM  ULTRASOUND RIGHT BREAST  COMPARISON:  Mammography 11/14/2013, 11/05/2012, dating back to 10/15/2007. No prior right breast ultrasound.  ACR Breast Density Category b: There are scattered areas of fibroglandular density.  FINDINGS: Spot compression CC and MLO views of the area of concern in the central right breast, middle 1/3, were obtained. These confirm a partially obscured mass with associated faint microcalcifications but no associated architectural distortion. The mass was not present on the prior mammograms.  On physical exam, there is no palpable abnormality in the right breast. A surgical scar from previous breast reduction is present.  Ultrasound is performed, showing an oval-shaped mass with irregularity along its posterior border at the 8:30 o'clock position of the right breast approximately 4 cm from the nipple, measuring approximately 6 x 4 x 6 mm, containing a microcalcification. There is no associated acoustic shadowing, but there is internal blood flow on power Doppler evaluation.  IMPRESSION: Indeterminate 6 mm mass at the 8:30 o'clock position of the right breast approximately 4 cm from the nipple.  RECOMMENDATION: Ultrasound-guided core  needle biopsy of the indeterminate mass is recommended. The procedure was discussed with the patient in detail. She has agreed to proceed. This was performed subsequently and is reported separately.  I have discussed the findings and recommendations with the patient. Results were also provided in writing at the conclusion of the visit.  BI-RADS CATEGORY  4: Suspicious abnormality - biopsy should be considered.   Electronically Signed   By: Evangeline Dakin M.D.   On: 12/01/2013 10:59   [place] Rt Breast Bx W Loc Dev 1st Lesion Img Bx Spec US Guide  12/02/2013   ADDENDUM REPORT: 12/02/2013 12:58  ADDENDUM: PATHOLOGY: Invasive ductal carcinoma.  CONCORDANT:  Yes.  I discussed these results and the recommendations below with the patient by telephone on 12/02/2013 at 1045 hr. All of her questions were answered. She denies significant pain or bleeding at the biopsy site.  RECOMMENDATION: Surgical consultation for excision. An appointment has been made with Dr. Harlow Asa of [facility] on March 11 at 10 o'clock a.m.   Electronically Signed   By: Evangeline Dakin M.D.   On: 12/02/2013 12:58   12/02/2013   CLINICAL DATA:  Indeterminate 6 mm mass identified on recent screening mammogram and diagnostic workup earlier today.  EXAM: ULTRASOUND GUIDED RIGHT BREAST CORE NEEDLE BIOPSY WITH VACUUM ASSIST  COMPARISON:  Previous exams.  PROCEDURE: I met with the patient and we discussed the procedure of ultrasound-guided biopsy, including benefits and alternatives. We discussed the high likelihood of a successful procedure. We discussed the risks of the procedure including infection, bleeding, tissue injury, clip migration, and inadequate sampling. Informed written consent was given. The usual time-out protocol was performed immediately prior to the procedure.  Using sterile technique and 2% Lidocaine as local anesthetic, under direct ultrasound visualization, a 12 gauge vacuum-assisteddevice was used to perform biopsy of the  6 mm mass at the 8:30 o'clock position of the right breast approximately 4 cm from the nipple using an inferior approach. At the conclusion of the procedure, a ribbon shaped tissue marker clip was deployed into the biopsy cavity. Follow-up 2-view mammogram was performed and dictated separately.  IMPRESSION: Ultrasound-guided biopsy of an indeterminate 6 mm mass in the outer right breast. No apparent complications.  Electronically Signed: By: Evangeline Dakin M.D. On: 12/01/2013 13:26   Mm Rt Plc Breast Loc Dev   1st Lesion  Inc Mammo Guide  12/12/2013   CLINICAL DATA:  Recent diagnosis of a 6 mm invasive ductal carcinoma in the right breast. Localization prior to right breast lumpectomy.  EXAM: NEEDLE LOCALIZATION OF THE RIGHT BREAST WITH MAMMO GUIDANCE  COMPARISON:  Previous exams.  FINDINGS: Patient presents for needle localization prior to right breast lumpectomy. I met with the patient and we discussed the procedure of needle localization including benefits and alternatives. We discussed the high likelihood of a successful procedure. We discussed the risks of the procedure, including infection, bleeding, tissue injury, and further surgery. Informed, written consent was given. The usual time-out protocol was performed immediately prior to the procedure.  Using mammographic guidance, sterile technique, 2% lidocaine and a 9 cm modified Kopans needle, the 6 mm mass and the ribbon tissue marker clip were localized using a lateral approach. The films were marked for Dr. Harlow Asa.  Specimen radiograph was performed at [facility] and confirms that the mass is present in the tissue sample. (The ribbon shaped tissue marker clip fell out of the sample during removal). The specimen was marked for pathology at [facility] [place] [facility] on the grid. This was discussed with the nurse in the operating room on 12/12/2013 at 1305 hr.  IMPRESSION: Needle localization right breast. No apparent complications.   Electronically Signed   By: Evangeline Dakin M.D.   On: 12/12/2013 10:26      IMPRESSION: T1 Nx metaplastic right breast cancer  PLAN: I spoke to the patient today regarding her diagnosis and options for treatment. We discussed the equivalency in terms of survival between mastectomy and lumpectomy plus radiation. We have discussed the role of radiation in decreasing local failures in patients  who undergo lumpectomy. We discussed treatment of her axilla with a directed PAB field given that she has not had her lymph nodes addressed. She does have an outer quadrant lesion a consideration must be given to treatment of her internal mammary lymph nodes as this could be a secondary row of spread given that the traditional route of spread to the axilla has been disrupted by her previous surgery. We discussed skin redness and fatigue as possible side effects. We discussed the low rate of symptomatic lung and heart damage. We discussed the low likelihood of secondary malignancies. I will plan on seeing her back after her chemotherapy is complete.  I spent 40 minutes  face to face with the patient and more than 50% of that time was spent in counseling and/or coordination of care.   ------------------------------------------------  Thea Silversmith, MD

## 2013-12-23 ENCOUNTER — Telehealth: Payer: Self-pay | Admitting: *Deleted

## 2013-12-23 NOTE — Telephone Encounter (Signed)
CALLED PATIENT TO INFORM OF TEST, LVM FOR A RETURN CALL 

## 2013-12-26 ENCOUNTER — Telehealth: Payer: Self-pay | Admitting: *Deleted

## 2013-12-26 ENCOUNTER — Other Ambulatory Visit (INDEPENDENT_AMBULATORY_CARE_PROVIDER_SITE_OTHER): Payer: Self-pay | Admitting: Surgery

## 2013-12-26 ENCOUNTER — Telehealth (INDEPENDENT_AMBULATORY_CARE_PROVIDER_SITE_OTHER): Payer: Self-pay

## 2013-12-26 NOTE — Telephone Encounter (Signed)
Per staff MD message I have scheduled appts

## 2013-12-26 NOTE — Telephone Encounter (Signed)
Returned patient's phone call, spoke with patient 

## 2013-12-26 NOTE — Telephone Encounter (Signed)
Martha Evans in scheduling has [place] placement orders on her desk to schedule and call pt.

## 2013-12-27 ENCOUNTER — Other Ambulatory Visit: Payer: Medicare Other

## 2013-12-28 ENCOUNTER — Encounter (HOSPITAL_COMMUNITY): Payer: Self-pay | Admitting: *Deleted

## 2013-12-28 ENCOUNTER — Telehealth: Payer: Self-pay | Admitting: *Deleted

## 2013-12-28 ENCOUNTER — Other Ambulatory Visit: Payer: Medicare Other

## 2013-12-28 NOTE — Telephone Encounter (Signed)
Per staff message and POF I have scheduled appts.  JMW  

## 2013-12-28 NOTE — Progress Notes (Signed)
Requested last office visit note and ekg from office of Dr Ubaldo Glassing at [facility] in [place] .

## 2013-12-29 ENCOUNTER — Telehealth: Payer: Self-pay

## 2013-12-29 ENCOUNTER — Other Ambulatory Visit: Payer: Medicare Other

## 2013-12-29 NOTE — Telephone Encounter (Signed)
Pt left msg wanting to reschedule chemo education class due to port placement.  Routed to [place]

## 2013-12-30 ENCOUNTER — Telehealth: Payer: Self-pay | Admitting: Oncology

## 2013-12-30 NOTE — Telephone Encounter (Signed)
per pt r/s ched from 4/6 to 4/9 due to port placement - pt has new d/t

## 2014-01-02 ENCOUNTER — Other Ambulatory Visit: Payer: Medicare Other

## 2014-01-02 ENCOUNTER — Encounter (HOSPITAL_COMMUNITY)
Admission: RE | Disposition: A | Discharge status: Home / Self Care | Payer: Self-pay | Source: Ambulatory Visit | Attending: Surgery

## 2014-01-02 ENCOUNTER — Ambulatory Visit (HOSPITAL_COMMUNITY): Payer: Medicare Other

## 2014-01-02 ENCOUNTER — Encounter (HOSPITAL_COMMUNITY): Payer: Medicare Other | Admitting: Certified Registered Nurse Anesthetist

## 2014-01-02 ENCOUNTER — Ambulatory Visit (HOSPITAL_COMMUNITY): Payer: Medicare Other | Admitting: Certified Registered Nurse Anesthetist

## 2014-01-02 ENCOUNTER — Ambulatory Visit (HOSPITAL_COMMUNITY)
Admission: RE | Admit: 2014-01-02 | Discharge: 2014-01-02 | Disposition: A | Discharge status: Home / Self Care | Payer: Medicare Other | Source: Ambulatory Visit | Attending: Surgery | Admitting: Surgery

## 2014-01-02 ENCOUNTER — Encounter (HOSPITAL_COMMUNITY): Payer: Self-pay | Admitting: *Deleted

## 2014-01-02 DIAGNOSIS — K219 Gastro-esophageal reflux disease without esophagitis: Secondary | ICD-10-CM | POA: Insufficient documentation

## 2014-01-02 DIAGNOSIS — I10 Essential (primary) hypertension: Secondary | ICD-10-CM | POA: Insufficient documentation

## 2014-01-02 DIAGNOSIS — Z170 Estrogen receptor positive status [ER+]: Secondary | ICD-10-CM

## 2014-01-02 DIAGNOSIS — C50511 Malignant neoplasm of lower-outer quadrant of right female breast: Secondary | ICD-10-CM | POA: Diagnosis present

## 2014-01-02 DIAGNOSIS — Z9010 Acquired absence of unspecified breast and nipple: Secondary | ICD-10-CM | POA: Insufficient documentation

## 2014-01-02 DIAGNOSIS — C50919 Malignant neoplasm of unspecified site of unspecified female breast: Secondary | ICD-10-CM

## 2014-01-02 DIAGNOSIS — Z79899 Other long term (current) drug therapy: Secondary | ICD-10-CM | POA: Insufficient documentation

## 2014-01-02 DIAGNOSIS — E042 Nontoxic multinodular goiter: Secondary | ICD-10-CM | POA: Insufficient documentation

## 2014-01-02 HISTORY — DX: Cardiac arrhythmia, unspecified: I49.9

## 2014-01-02 HISTORY — PX: PORTACATH PLACEMENT: SHX2246

## 2014-01-02 HISTORY — DX: Malignant (primary) neoplasm, unspecified: C80.1

## 2014-01-02 LAB — CBC
HEMATOCRIT: 36.6 % (ref 36.0–46.0)
Hemoglobin: 12 g/dL (ref 12.0–15.0)
MCH: 29.6 pg (ref 26.0–34.0)
MCHC: 32.8 g/dL (ref 30.0–36.0)
MCV: 90.1 fL (ref 78.0–100.0)
Platelets: 235 10*3/uL (ref 150–400)
RBC: 4.06 MIL/uL (ref 3.87–5.11)
RDW: 13 % (ref 11.5–15.5)
WBC: 7.4 10*3/uL (ref 4.0–10.5)

## 2014-01-02 LAB — BASIC METABOLIC PANEL
BUN: 15 mg/dL (ref 6–23)
CHLORIDE: 105 meq/L (ref 96–112)
CO2: 25 meq/L (ref 19–32)
Calcium: 9.5 mg/dL (ref 8.4–10.5)
Creatinine, Ser: 0.81 mg/dL (ref 0.50–1.10)
GFR calc Af Amer: 86 mL/min — ABNORMAL LOW (ref >90)
GFR calc non Af Amer: 74 mL/min — ABNORMAL LOW (ref >90)
GLUCOSE: 101 mg/dL — AB (ref 70–99)
Potassium: 3.9 mEq/L (ref 3.7–5.3)
Sodium: 140 mEq/L (ref 137–147)

## 2014-01-02 SURGERY — INSERTION, TUNNELED CENTRAL VENOUS DEVICE, WITH PORT
Anesthesia: Monitor Anesthesia Care | Site: Chest | Laterality: Left

## 2014-01-02 MED ORDER — CEFAZOLIN SODIUM-DEXTROSE 2-3 GM-% IV SOLR
INTRAVENOUS | Status: AC
  Filled 2014-01-02: qty 50

## 2014-01-02 MED ORDER — LIDOCAINE HCL (PF) 1 % IJ SOLN
INTRAMUSCULAR | Status: DC | PRN
  Administered 2014-01-02: 10 mL

## 2014-01-02 MED ORDER — PROPOFOL 10 MG/ML IV BOLUS
INTRAVENOUS | Status: AC
  Filled 2014-01-02: qty 20

## 2014-01-02 MED ORDER — LIDOCAINE HCL (CARDIAC) 20 MG/ML IV SOLN
INTRAVENOUS | Status: DC | PRN
  Administered 2014-01-02: 80 mg via INTRAVENOUS

## 2014-01-02 MED ORDER — LIDOCAINE HCL 1 % IJ SOLN
INTRAMUSCULAR | Status: AC
  Filled 2014-01-02: qty 20

## 2014-01-02 MED ORDER — CEFAZOLIN SODIUM-DEXTROSE 2-3 GM-% IV SOLR
2.0000 g | INTRAVENOUS | Status: AC
  Administered 2014-01-02: 2 g via INTRAVENOUS

## 2014-01-02 MED ORDER — LIDOCAINE HCL (CARDIAC) 20 MG/ML IV SOLN
INTRAVENOUS | Status: AC
  Filled 2014-01-02: qty 5

## 2014-01-02 MED ORDER — HEPARIN SOD (PORK) LOCK FLUSH 100 UNIT/ML IV SOLN
INTRAVENOUS | Status: AC
  Filled 2014-01-02: qty 5

## 2014-01-02 MED ORDER — FENTANYL CITRATE 0.05 MG/ML IJ SOLN
INTRAMUSCULAR | Status: DC | PRN
  Administered 2014-01-02: 50 ug via INTRAVENOUS

## 2014-01-02 MED ORDER — DEXAMETHASONE SODIUM PHOSPHATE 10 MG/ML IJ SOLN
INTRAMUSCULAR | Status: DC | PRN
  Administered 2014-01-02: 10 mg via INTRAVENOUS

## 2014-01-02 MED ORDER — MIDAZOLAM HCL 5 MG/5ML IJ SOLN
INTRAMUSCULAR | Status: DC | PRN
  Administered 2014-01-02: 2 mg via INTRAVENOUS

## 2014-01-02 MED ORDER — ONDANSETRON HCL 4 MG/2ML IJ SOLN
INTRAMUSCULAR | Status: AC
  Filled 2014-01-02: qty 2

## 2014-01-02 MED ORDER — HYDROMORPHONE HCL PF 1 MG/ML IJ SOLN: 0.2500 mg | INTRAMUSCULAR | Status: DC | PRN

## 2014-01-02 MED ORDER — SODIUM CHLORIDE 0.9 % IR SOLN
Freq: Once | Status: AC
  Administered 2014-01-02: 08:00:00
  Filled 2014-01-02: qty 1.2

## 2014-01-02 MED ORDER — MEPERIDINE HCL 50 MG/ML IJ SOLN
6.2500 mg | INTRAMUSCULAR | Status: DC | PRN
Start: 2014-01-02 — End: 2014-01-02

## 2014-01-02 MED ORDER — HYDROCODONE-ACETAMINOPHEN 5-325 MG PO TABS: 1.0000 | ORAL_TABLET | ORAL | Status: DC | PRN

## 2014-01-02 MED ORDER — SODIUM CHLORIDE 0.9 % IR SOLN
Status: DC | PRN
  Administered 2014-01-02: 1000 mL

## 2014-01-02 MED ORDER — PROMETHAZINE HCL 25 MG/ML IJ SOLN: 6.2500 mg | INTRAMUSCULAR | Status: DC | PRN

## 2014-01-02 MED ORDER — ONDANSETRON HCL 4 MG/2ML IJ SOLN
INTRAMUSCULAR | Status: DC | PRN
  Administered 2014-01-02: 4 mg via INTRAVENOUS

## 2014-01-02 MED ORDER — HEPARIN SOD (PORK) LOCK FLUSH 100 UNIT/ML IV SOLN
INTRAVENOUS | Status: DC | PRN
  Administered 2014-01-02: 400 [IU]

## 2014-01-02 MED ORDER — LACTATED RINGERS IV SOLN
INTRAVENOUS | Status: DC | PRN
  Administered 2014-01-02: 07:00:00 via INTRAVENOUS

## 2014-01-02 MED ORDER — DEXAMETHASONE SODIUM PHOSPHATE 10 MG/ML IJ SOLN
INTRAMUSCULAR | Status: AC
  Filled 2014-01-02: qty 1

## 2014-01-02 MED ORDER — PROPOFOL 10 MG/ML IV BOLUS
INTRAVENOUS | Status: DC | PRN
  Administered 2014-01-02: 160 mg via INTRAVENOUS

## 2014-01-02 MED ORDER — FENTANYL CITRATE 0.05 MG/ML IJ SOLN
INTRAMUSCULAR | Status: AC
  Filled 2014-01-02: qty 2

## 2014-01-02 MED ORDER — MIDAZOLAM HCL 2 MG/2ML IJ SOLN
INTRAMUSCULAR | Status: AC
  Filled 2014-01-02: qty 2

## 2014-01-02 SURGICAL SUPPLY — 37 items
APL SKNCLS STERI-STRIP NONHPOA (GAUZE/BANDAGES/DRESSINGS) ×1
BAG DECANTER FOR FLEXI CONT (MISCELLANEOUS) ×3 IMPLANT
BENZOIN TINCTURE PRP APPL 2/3 (GAUZE/BANDAGES/DRESSINGS) ×2 IMPLANT
BLADE SURG 15 STRL LF DISP TIS (BLADE) ×1 IMPLANT
BLADE SURG 15 STRL SS (BLADE) ×3
BLADE SURG SZ10 CARB STEEL (BLADE) ×1 IMPLANT
CLOSURE WOUND 1/4X4 (GAUZE/BANDAGES/DRESSINGS) ×1
DECANTER SPIKE VIAL GLASS SM (MISCELLANEOUS) ×3 IMPLANT
DRAPE C-ARM 42X120 X-RAY (DRAPES) ×3 IMPLANT
DRAPE LAPAROTOMY TRNSV 102X78 (DRAPE) ×3 IMPLANT
ELECT REM PT RETURN 9FT ADLT (ELECTROSURGICAL) ×3
ELECTRODE REM PT RTRN 9FT ADLT (ELECTROSURGICAL) ×1 IMPLANT
GAUZE SPONGE 4X4 16PLY XRAY LF (GAUZE/BANDAGES/DRESSINGS) ×3 IMPLANT
GLOVE BIOGEL PI IND STRL 7.0 (GLOVE) ×1 IMPLANT
GLOVE BIOGEL PI INDICATOR 7.0 (GLOVE) ×2
GLOVE SURG ORTHO 8.0 STRL STRW (GLOVE) ×3 IMPLANT
GOWN STRL REUS W/TWL LRG LVL3 (GOWN DISPOSABLE) ×1 IMPLANT
GOWN STRL REUS W/TWL XL LVL3 (GOWN DISPOSABLE) ×6 IMPLANT
KIT BARDPORT ISP (Port) IMPLANT
KIT BARDPORT ISP 9.6FR (PORTABLE EQUIPMENT SUPPLIES) IMPLANT
KIT BASIN OR (CUSTOM PROCEDURE TRAY) ×3 IMPLANT
KIT PORT POWER 8FR ISP CVUE (Catheter) ×3 IMPLANT
MARKER SKIN DUAL TIP RULER LAB (MISCELLANEOUS) ×3 IMPLANT
NEEDLE HYPO 25X1 1.5 SAFETY (NEEDLE) ×3 IMPLANT
NS IRRIG 1000ML POUR BTL (IV SOLUTION) ×3 IMPLANT
PACK BASIC VI WITH GOWN DISP (CUSTOM PROCEDURE TRAY) ×3 IMPLANT
PENCIL BUTTON HOLSTER BLD 10FT (ELECTRODE) ×3 IMPLANT
SPONGE GAUZE 4X4 12PLY (GAUZE/BANDAGES/DRESSINGS) IMPLANT
STRIP CLOSURE SKIN 1/4X4 (GAUZE/BANDAGES/DRESSINGS) ×2 IMPLANT
SUT PROLENE 2 0 CT2 30 (SUTURE) ×3 IMPLANT
SUT VIC AB 3-0 SH 27 (SUTURE) ×3
SUT VIC AB 3-0 SH 27XBRD (SUTURE) ×1 IMPLANT
SUT VIC AB 4-0 PS2 27 (SUTURE) ×3 IMPLANT
SYR CONTROL 10ML LL (SYRINGE) ×3 IMPLANT
TOWEL OR 17X26 10 PK STRL BLUE (TOWEL DISPOSABLE) ×3 IMPLANT
TOWEL OR NON WOVEN STRL DISP B (DISPOSABLE) ×2 IMPLANT
WATER STERILE IRR 1500ML POUR (IV SOLUTION) ×3 IMPLANT

## 2014-01-02 NOTE — H&P (View-Only) (Signed)
General Surgery [facility]  Chief Complaint  Patient presents with  . New Evaluation    newly diagnosed right breast cancer - referral from Dr. Evangeline Dakin; primary care is Dr. [name]    HISTORY: The patient is a 68 year old female known to my surgical practice from management of thyroid nodules. Patient had routine screening mammography. She was noted to have an abnormality in the right breast. She underwent additional views and a right breast ultrasound. This showed a suspicious 6 mm nodule in the inferior outer quadrant of the right breast. There were microcalcifications. This was a new finding compared to her mammogram of 1 year ago. Patient underwent core needle biopsy showing invasive ductal carcinoma. Patient is now referred for definitive surgical resection.  Patient had had a reduction mammoplasty in the 1980s. She has had no other history of breast disease and no other breast biopsies. There is no family history of breast disease and specifically no history of breast cancer. Patient denies any recent pain. She denies nipple discharge.  Past Medical History  Diagnosis Date  . Hypertension   . Allergy     Current Outpatient Prescriptions  Medication Sig Dispense Refill  . milk thistle 175 MG tablet Take 175 mg by mouth daily.      . montelukast (SINGULAIR) 10 MG tablet Take 10 mg by mouth at bedtime.      Marland Kitchen omeprazole (PRILOSEC) 20 MG capsule Take 20 mg by mouth daily.      Marland Kitchen telmisartan (MICARDIS) 40 MG tablet Take 40 mg by mouth daily.      . calcium-vitamin D (OSCAL WITH D) 500-200 MG-UNIT per tablet Take 1 tablet by mouth.      . co-enzyme Q-10 30 MG capsule Take 30 mg by mouth 3 (three) times daily.      Marland Kitchen loratadine (CLARITIN) 10 MG tablet Take 10 mg by mouth daily.       No current facility-administered medications for this visit.    Allergies  Allergen Reactions  . Codeine Sulfate     Vomiting, nausea    Family History  Problem  Relation Age of Onset  . Cancer Father     bladder & pancreatic    History   Social History  . Marital Status: Married    Spouse Name: N/A    Number of Children: N/A  . Years of Education: N/A   Social History Main Topics  . Smoking status: Never Smoker   . Smokeless tobacco: Never Used  . Alcohol Use: Yes     Comment: seldom  . Drug Use: No  . Sexual Activity: None   Other Topics Concern  . None   Social History Narrative  . None    REVIEW OF SYSTEMS - PERTINENT POSITIVES ONLY: Denies breast pain. Denies breast mass. Denies nipple discharge.  EXAM: Filed Vitals:   12/07/13 1016  BP: 135/82  Pulse: 74  Temp: 98.5 F (36.9 C)  Resp: 14    GENERAL: well-developed, well-nourished, no acute distress HEENT: normocephalic; pupils equal and reactive; sclerae clear; dentition good; mucous membranes moist NECK:  Well-healed anterior cervical incision; symmetric on extension; no palpable anterior or posterior cervical lymphadenopathy; no supraclavicular masses; no tenderness CHEST: clear to auscultation bilaterally without rales, rhonchi, or wheezes CARDIAC: regular rate and rhythm without significant murmur; peripheral pulses are full BREAST: Well-healed surgical incisions bilaterally consistent with reduction mammoplasty; area of ecchymosis in the inferior. Areolar area of the right breast consistent with recent  biopsy; palpation of the right breast shows diffusely nodular breast parenchyma with output dominant or discrete mass; right axilla is free of adenopathy.  Left breast shows diffusely nodular breast parenchyma without discrete or dominant mass. Left axilla is free of adenopathy. EXT:  non-tender without edema; no deformity NEURO: no gross focal deficits; no sign of tremor   LABORATORY RESULTS: See [facility] (CHL-Epic) for most recent results  RADIOLOGY RESULTS: See [facility] (CHL-Epic) for most recent results  IMPRESSION: #1 invasive ductal  carcinoma, right breast, 6 mm #2 personal history of thyroid nodules  PLAN: The patient and I reviewed the radiographic findings and the pathology results. I have recommended partial mastectomy with wire localization. We will perform concurrent sentinel lymph node biopsy. Risk and benefits are discussed with the patient. We will proceed as soon as possible at a time convenient for the patient.  The risks and benefits of the procedure have been discussed at length with the patient.  The patient understands the proposed procedure, potential alternative treatments, and the course of recovery to be expected.  All of the patient's questions have been answered at this time.  The patient wishes to proceed with surgery.  Earnstine Regal, MD, [facility]  Primary Care Physician: [name], MD

## 2014-01-02 NOTE — Anesthesia Postprocedure Evaluation (Signed)
Anesthesia Post Note  Patient: Martha Evans  Procedure(s) Performed: Procedure(s) (LRB): INSERTION PORT-A-CATH (Left)  Anesthesia type: General  Patient location: PACU  Post pain: Pain level controlled  Post assessment: Post-op Vital signs reviewed  Last Vitals: BP 140/68  Pulse 51  Temp(Src) 36.3 C (Oral)  Resp 18  Ht 5\' 6"  (1.676 m)  Wt 185 lb 4 oz (84.029 kg)  BMI 29.91 kg/m2  SpO2 99%  Post vital signs: Reviewed  Level of consciousness: sedated  Complications: No apparent anesthesia complications

## 2014-01-02 NOTE — Anesthesia Preprocedure Evaluation (Signed)
Anesthesia Evaluation  Patient identified by MRN, date of birth, ID band Patient awake    Reviewed: Allergy & Precautions, H&P , NPO status , Patient's Chart, lab work & pertinent test results, reviewed documented beta blocker date and time   History of Anesthesia Complications (+) DIFFICULT AIRWAY and history of anesthetic complications  Airway Mallampati: II TM Distance: >3 FB Neck ROM: full    Dental   Pulmonary neg pulmonary ROS,  breath sounds clear to auscultation        Cardiovascular hypertension, Pt. on medications Rhythm:regular     Neuro/Psych negative neurological ROS  negative psych ROS   GI/Hepatic Neg liver ROS, GERD-  Medicated and Controlled,  Endo/Other  negative endocrine ROS  Renal/GU negative Renal ROS  negative genitourinary   Musculoskeletal   Abdominal   Peds  Hematology negative hematology ROS (+)   Anesthesia Other Findings See surgeon's H&P   Reproductive/Obstetrics negative OB ROS                           Anesthesia Physical  Anesthesia Plan  ASA: II  Anesthesia Plan: MAC   Post-op Pain Management:    Induction: Intravenous  Airway Management Planned:   Additional Equipment:   Intra-op Plan:   Post-operative Plan:   Informed Consent: I have reviewed the patients History and Physical, chart, labs and discussed the procedure including the risks, benefits and alternatives for the proposed anesthesia with the patient or authorized representative who has indicated his/her understanding and acceptance.   Dental advisory given  Plan Discussed with: CRNA  Anesthesia Plan Comments:         Anesthesia Quick Evaluation

## 2014-01-02 NOTE — Brief Op Note (Signed)
01/02/2014  8:32 AM  PATIENT:  Blair Dolphin  68 y.o. female  PRE-OPERATIVE DIAGNOSIS:  Right breast cancer   POST-OPERATIVE DIAGNOSIS:  same  PROCEDURE:  Procedure(s): INSERTION PORT-A-CATH (Left)  SURGEON:  Surgeon(s) and Role:    * Earnstine Regal, MD - Primary  ANESTHESIA:   general  EBL:     BLOOD ADMINISTERED:none  DRAINS: Clear View infusion port in left subclavian vein   LOCAL MEDICATIONS USED:  MARCAINE     SPECIMEN:  No Specimen  DISPOSITION OF SPECIMEN:  N/A  COUNTS:  YES  TOURNIQUET:  * No tourniquets in log *  DICTATION: .Other Dictation: Dictation Number [id number]  PLAN OF CARE: Discharge to home after PACU  PATIENT DISPOSITION:  PACU - hemodynamically stable.   Delay start of Pharmacological VTE agent (>24hrs) due to surgical blood loss or risk of bleeding: yes  Earnstine Regal, MD, [facility] Office: [phone]

## 2014-01-02 NOTE — Op Note (Signed)
[name], Martha Evans                 ACCOUNT NO.:  [id number]  MEDICAL RECORD NO.:  [id number]  LOCATION:  [facility]                         FACILITY:  [facility]  PHYSICIAN:  Earnstine Regal, MD      DATE OF BIRTH:  November 11, 1945  DATE OF PROCEDURE:  01/02/2014                               OPERATIVE REPORT   PREOPERATIVE DIAGNOSIS:  Right breast carcinoma.  POSTOPERATIVE DIAGNOSIS:  Right breast carcinoma.  PROCEDURE:  Placement left subclavian vein infusion port.  SURGEON:  Earnstine Regal, MD, FACS  ANESTHESIA:  General per Dr. Thurnell Garbe.  ESTIMATED BLOOD LOSS:  Minimal.  PREPARATION:  ChloraPrep.  COMPLICATIONS:  None.  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  She will require adjuvant chemotherapy and radiation therapy.  Placement of an infusion port is requested by Medical Oncology.  BODY OF REPORT:  Procedure was done in OR #10 at the [facility].  The patient was brought to the operating room, placed in a supine position on the operating room table.  Following administration of general anesthesia, the patient was positioned and then prepped and draped in the usual aseptic fashion.  After ascertaining that an adequate level of anesthesia had been achieved, the skin beneath the left clavicle was anesthetized with local anesthetic. The patient was placed in Trendelenburg.  Using an 18-gauge Seldinger needle, the left subclavian vein was accessed and a guidewire was inserted and advanced under fluoroscopic guidance into position in the superior vena cava.  The needle was removed.  Next, the skin of the left chest wall was anesthetized with local anesthetic.  A 2.5-cm incision was made with a #15 blade.  A subcutaneous pocket was created to accommodate the clear view infusion port.  Hemostasis was achieved with the electrocautery.  Port was brought onto the field  and assembled.  It was flushed with heparinized saline.  Catheter was tunneled subcutaneously between the port site and the site of guidewire insertion using a tendon passer. Port was secured to the chest wall in the subcutaneous pocket with interrupted 2-0 Prolene sutures.  The catheter was measured to 24 cm in length and divided.  Using a dilator and peel-away sleeve, the subclavian vein was again accessed.  Dilator and guidewire were removed. Catheter was fed through the peel-away sleeve into position in the superior vena cava and the peel-away sleeve was removed.  C-arm fluoroscopy confirmed catheter placement.  Catheter aspirated blood easily and flushed easily with heparinized saline.  Catheter was flushed with 5 mL of 100 unit/mL heparin lock flush.  Subcutaneous tissues were closed with interrupted 3-0 Vicryl sutures. Skin was closed with a running 4-0 Monocryl subcuticular suture.  Wounds were washed and dried and benzoin and Steri-Strips were applied. Sterile dressings were applied.  The patient was awakened from anesthesia and brought to the recovery room.  The patient tolerated the procedure well.   Earnstine Regal, MD, [facility] Office: [phone]    TMG/MEDQ  D:  01/02/2014  T:  01/02/2014  Job:  [id number]  cc:   Marcy Panning, M.D.  Thea Silversmith, M.D. Fax: [phone]  [name], MD Fax: [phone]

## 2014-01-02 NOTE — Interval H&P Note (Signed)
History and Physical Interval Note:  01/02/2014 7:27 AM  Martha Evans  has presented today for surgery, with the diagnosis of breast cancer.   The various methods of treatment have been discussed with the patient and family. After consideration of risks, benefits and other options for treatment, the patient has consented to    Procedure(s): INSERTION PORT-A-CATH (N/A) as a surgical intervention .    The patient's history has been reviewed, patient examined, no change in status, stable for surgery.  I have reviewed the patient's chart and labs.  Questions were answered to the patient's satisfaction.    Earnstine Regal, MD, [facility] Office: [place]

## 2014-01-02 NOTE — Transfer of Care (Signed)
Immediate Anesthesia Transfer of Care Note  Patient: Martha Evans  Procedure(s) Performed: Procedure(s): INSERTION PORT-A-CATH (Left)  Patient Location: PACU  Anesthesia Type:General  Level of Consciousness: awake and alert   Airway & Oxygen Therapy: Patient Spontanous Breathing and Patient connected to face mask oxygen  Post-op Assessment: Report given to PACU RN and Post -op Vital signs reviewed and stable  Post vital signs: Reviewed and stable  Complications: No apparent anesthesia complications

## 2014-01-04 ENCOUNTER — Encounter (HOSPITAL_COMMUNITY): Payer: Self-pay | Admitting: Surgery

## 2014-01-04 ENCOUNTER — Ambulatory Visit
Admission: RE | Admit: 2014-01-04 | Discharge: 2014-01-04 | Disposition: A | Discharge status: Home / Self Care | Payer: Medicare Other | Source: Ambulatory Visit | Attending: Radiation Oncology | Admitting: Radiation Oncology

## 2014-01-04 DIAGNOSIS — C50919 Malignant neoplasm of unspecified site of unspecified female breast: Secondary | ICD-10-CM

## 2014-01-05 ENCOUNTER — Encounter: Payer: Self-pay | Admitting: *Deleted

## 2014-01-05 ENCOUNTER — Other Ambulatory Visit: Payer: Medicare Other

## 2014-01-12 ENCOUNTER — Telehealth: Payer: Self-pay

## 2014-01-12 NOTE — Telephone Encounter (Signed)
Pt called - chemo start 4/20.  Wants to know if she should remove fake nails prior to chemo.  Let pt know chemo can affect her nails - may darken and separate.  Also higher risk for infection with false nails.  Per Alleta, nails should be removed - next week is ok.  Will ask infusion to confirm with patient on Monday.

## 2014-01-16 ENCOUNTER — Encounter: Payer: Self-pay | Admitting: Specialist

## 2014-01-16 ENCOUNTER — Encounter: Payer: Self-pay | Admitting: Oncology

## 2014-01-16 ENCOUNTER — Ambulatory Visit (HOSPITAL_BASED_OUTPATIENT_CLINIC_OR_DEPARTMENT_OTHER): Payer: Medicare Other

## 2014-01-16 ENCOUNTER — Ambulatory Visit (HOSPITAL_BASED_OUTPATIENT_CLINIC_OR_DEPARTMENT_OTHER): Payer: Medicare Other | Admitting: Oncology

## 2014-01-16 ENCOUNTER — Other Ambulatory Visit (HOSPITAL_BASED_OUTPATIENT_CLINIC_OR_DEPARTMENT_OTHER): Payer: Medicare Other

## 2014-01-16 VITALS — BP 146/73 | HR 89 | Temp 97.9°F | Resp 18 | Ht 66.0 in | Wt 188.4 lb

## 2014-01-16 VITALS — BP 119/63 | HR 74 | Temp 97.7°F | Resp 8

## 2014-01-16 DIAGNOSIS — C50519 Malignant neoplasm of lower-outer quadrant of unspecified female breast: Secondary | ICD-10-CM

## 2014-01-16 DIAGNOSIS — C50511 Malignant neoplasm of lower-outer quadrant of right female breast: Secondary | ICD-10-CM

## 2014-01-16 DIAGNOSIS — Z170 Estrogen receptor positive status [ER+]: Secondary | ICD-10-CM

## 2014-01-16 DIAGNOSIS — Z5111 Encounter for antineoplastic chemotherapy: Secondary | ICD-10-CM

## 2014-01-16 LAB — COMPREHENSIVE METABOLIC PANEL (CC13)
ALK PHOS: 102 U/L (ref 40–150)
ALT: 27 U/L (ref 0–55)
ANION GAP: 10 meq/L (ref 3–11)
AST: 20 U/L (ref 5–34)
Albumin: 3.9 g/dL (ref 3.5–5.0)
BILIRUBIN TOTAL: 0.41 mg/dL (ref 0.20–1.20)
BUN: 21.3 mg/dL (ref 7.0–26.0)
CO2: 22 mEq/L (ref 22–29)
CREATININE: 0.9 mg/dL (ref 0.6–1.1)
Calcium: 10.2 mg/dL (ref 8.4–10.4)
Chloride: 107 mEq/L (ref 98–109)
Glucose: 197 mg/dl — ABNORMAL HIGH (ref 70–140)
Potassium: 4 mEq/L (ref 3.5–5.1)
Sodium: 139 mEq/L (ref 136–145)
Total Protein: 7.5 g/dL (ref 6.4–8.3)

## 2014-01-16 LAB — CBC WITH DIFFERENTIAL/PLATELET
BASO%: 0.1 % (ref 0.0–2.0)
Basophils Absolute: 0 10*3/uL (ref 0.0–0.1)
EOS%: 0 % (ref 0.0–7.0)
Eosinophils Absolute: 0 10*3/uL (ref 0.0–0.5)
HEMATOCRIT: 38.7 % (ref 34.8–46.6)
HGB: 12.8 g/dL (ref 11.6–15.9)
LYMPH%: 6.2 % — AB (ref 14.0–49.7)
MCH: 29.5 pg (ref 25.1–34.0)
MCHC: 33 g/dL (ref 31.5–36.0)
MCV: 89.5 fL (ref 79.5–101.0)
MONO#: 0.4 10*3/uL (ref 0.1–0.9)
MONO%: 2.2 % (ref 0.0–14.0)
NEUT#: 17.6 10*3/uL — ABNORMAL HIGH (ref 1.5–6.5)
NEUT%: 91.5 % — AB (ref 38.4–76.8)
PLATELETS: 260 10*3/uL (ref 145–400)
RBC: 4.32 10*6/uL (ref 3.70–5.45)
RDW: 13.6 % (ref 11.2–14.5)
WBC: 19.2 10*3/uL — ABNORMAL HIGH (ref 3.9–10.3)
lymph#: 1.2 10*3/uL (ref 0.9–3.3)

## 2014-01-16 MED ORDER — SODIUM CHLORIDE 0.9 % IV SOLN
600.0000 mg/m2 | Freq: Once | INTRAVENOUS | Status: AC
  Administered 2014-01-16: 1180 mg via INTRAVENOUS
  Filled 2014-01-16: qty 59

## 2014-01-16 MED ORDER — LORAZEPAM 2 MG/ML IJ SOLN
0.5000 mg | Freq: Once | INTRAMUSCULAR | Status: AC
  Administered 2014-01-16: 0.5 mg via INTRAVENOUS

## 2014-01-16 MED ORDER — DEXAMETHASONE SODIUM PHOSPHATE 20 MG/5ML IJ SOLN
INTRAMUSCULAR | Status: AC
  Filled 2014-01-16: qty 5

## 2014-01-16 MED ORDER — LORAZEPAM 2 MG/ML IJ SOLN
INTRAMUSCULAR | Status: AC
  Filled 2014-01-16: qty 1

## 2014-01-16 MED ORDER — SODIUM CHLORIDE 0.9 % IV SOLN
75.0000 mg/m2 | Freq: Once | INTRAVENOUS | Status: AC
  Administered 2014-01-16: 150 mg via INTRAVENOUS
  Filled 2014-01-16: qty 15

## 2014-01-16 MED ORDER — SODIUM CHLORIDE 0.9 % IV SOLN
Freq: Once | INTRAVENOUS | Status: AC
  Administered 2014-01-16: 10:00:00 via INTRAVENOUS

## 2014-01-16 MED ORDER — DEXAMETHASONE SODIUM PHOSPHATE 20 MG/5ML IJ SOLN
20.0000 mg | Freq: Once | INTRAMUSCULAR | Status: AC
  Administered 2014-01-16: 20 mg via INTRAVENOUS

## 2014-01-16 MED ORDER — ONDANSETRON 16 MG/50ML IVPB (CHCC)
INTRAVENOUS | Status: AC
  Filled 2014-01-16: qty 16

## 2014-01-16 MED ORDER — ONDANSETRON 16 MG/50ML IVPB (CHCC)
16.0000 mg | Freq: Once | INTRAVENOUS | Status: AC
  Administered 2014-01-16: 16 mg via INTRAVENOUS

## 2014-01-16 MED ORDER — SODIUM CHLORIDE 0.9 % IJ SOLN
10.0000 mL | INTRAMUSCULAR | Status: DC | PRN
  Administered 2014-01-16: 10 mL
  Filled 2014-01-16: qty 10

## 2014-01-16 MED ORDER — HEPARIN SOD (PORK) LOCK FLUSH 100 UNIT/ML IV SOLN
500.0000 [IU] | Freq: Once | INTRAVENOUS | Status: AC | PRN
  Administered 2014-01-16: 500 [IU]
  Filled 2014-01-16: qty 5

## 2014-01-16 NOTE — Progress Notes (Signed)
Visited with patient in infusion room. She and her spouse were there together. Patient said chemo was "easy" -- much less stressful than she expected. She was concerned about losing her hair, however. Chaplain offered empathetic listening and supported her as she discussed alternatives. Patient and spouse also wanted more information on advanced directives; provided education about this and encouraged her to complete the paperwork while here.  Martha Evans, [facility], PhD Chaplain

## 2014-01-16 NOTE — Patient Instructions (Signed)
Proceed with chemotherapy today  You will return tomorrow for neulasta inj  Please call with any problems

## 2014-01-17 ENCOUNTER — Ambulatory Visit (HOSPITAL_BASED_OUTPATIENT_CLINIC_OR_DEPARTMENT_OTHER): Payer: Medicare Other

## 2014-01-17 ENCOUNTER — Telehealth: Payer: Self-pay | Admitting: *Deleted

## 2014-01-17 VITALS — BP 115/64 | HR 64 | Temp 97.5°F

## 2014-01-17 DIAGNOSIS — Z5189 Encounter for other specified aftercare: Secondary | ICD-10-CM

## 2014-01-17 DIAGNOSIS — C50511 Malignant neoplasm of lower-outer quadrant of right female breast: Secondary | ICD-10-CM

## 2014-01-17 DIAGNOSIS — C50519 Malignant neoplasm of lower-outer quadrant of unspecified female breast: Secondary | ICD-10-CM

## 2014-01-17 MED ORDER — PEGFILGRASTIM INJECTION 6 MG/0.6ML
6.0000 mg | Freq: Once | SUBCUTANEOUS | Status: AC
  Administered 2014-01-17: 6 mg via SUBCUTANEOUS
  Filled 2014-01-17: qty 0.6

## 2014-01-17 NOTE — Telephone Encounter (Signed)
Kandiss here for Neulasta injection following 1st taxot/cyt chemo treatment.  States that she is doing well, everything went smoothly yesterday.   No nausea, vomiting or diarrhea.  Is drinking and eating without problems.  All questions answered   Knows to call if she has any problems or concerns.

## 2014-01-17 NOTE — Patient Instructions (Signed)

## 2014-01-20 ENCOUNTER — Telehealth: Payer: Self-pay | Admitting: *Deleted

## 2014-01-20 NOTE — Telephone Encounter (Signed)
PT. RETURNED CALL. SHE HAS HAD VERY GOOD RESULTS. INSTRUCTED PT. TO CONTINUE WITH A BOWEL REGIMEN. SHE VOICES UNDERSTANDING. PT. WAS VERY APPRECIATIVE.

## 2014-01-20 NOTE — Telephone Encounter (Signed)
Patient's platelet count on 01/16/2014 was 260K so she is not thrombocytopenic.  Since she had diarrhea last Sunday and some bowel movement on Wednesday I doubt this is an obstruction.   Recommend she take Miralax - a double dose now, and then regular dose Morning and evening. She should also continue her sennakot - she can take 4 this morning. Also may try glycerin suppository.    If she doesn't have relief or if pain gets worse she may need to be seen in ER to rule out impaction.

## 2014-01-20 NOTE — Telephone Encounter (Signed)
INSTRUCTIONS FROM SARAH JOHNSON,NP GIVEN TO PT. SHE WILL CALL THIS AFTERNOON WITH AN UPDATE.

## 2014-01-20 NOTE — Telephone Encounter (Signed)
   Provider input needed:    Reason for call: CONSTIPATION      ALLERGIES:  is allergic to codeine sulfate and tramadol.  Patient last received chemotherapy/ treatment on 01/16/14   Patient was last seen in the office on 01/16/14  Next appt is 01/23/14.  Is patient having fevers greater than 100.5? NO  Is patient having uncontrolled pain, or new pain? LOWER ABDOMINAL PAIN AT A SEVEN  Is patient having new back pain that changes with position (worsens or eases when laying down?) NO  Is patient able to eat and drink? YES- AT LEAST 64 OUNCES IN A 24 HOUR PERIOD  Is patient able to pass stool without difficulty? NO- ON Sunday PT. HAD DIARRHEA X3. ON Wednesday PT.PASSED A SMALL OF STOOL. SHE IS PASSING GAS AND BURPING. HER ABDOMEN IS FIRM AND FEELS SWOLLEN. YESTERDAY SHE TOOK TWO SENOKOT-S AND DRANK 12 OUNCES OF PRUNE JUICE BUT NO RESULTS.   Is patient having uncontrolled nausea? NO  PT. calls 01/20/2014 with complaint of CONSTIPATION.   Summary Based on the above information advised PT. MAY CONTINUE PRUNE JUICE UNTIL A RETURN CALL.   Wyonia Hough  01/20/2014, 9:44 AM   Background Info  [name]   DOB: May 19, 1946   MR#: [id number]   CSN#   [id number] 01/20/2014

## 2014-01-23 ENCOUNTER — Telehealth: Payer: Self-pay | Admitting: Hematology and Oncology

## 2014-01-23 ENCOUNTER — Telehealth: Payer: Self-pay | Admitting: *Deleted

## 2014-01-23 ENCOUNTER — Ambulatory Visit (HOSPITAL_BASED_OUTPATIENT_CLINIC_OR_DEPARTMENT_OTHER): Payer: Medicare Other | Admitting: Hematology and Oncology

## 2014-01-23 ENCOUNTER — Other Ambulatory Visit (HOSPITAL_BASED_OUTPATIENT_CLINIC_OR_DEPARTMENT_OTHER): Payer: Medicare Other

## 2014-01-23 VITALS — BP 143/77 | HR 93 | Temp 98.0°F | Resp 18 | Ht 66.0 in | Wt 183.1 lb

## 2014-01-23 DIAGNOSIS — C50511 Malignant neoplasm of lower-outer quadrant of right female breast: Secondary | ICD-10-CM

## 2014-01-23 DIAGNOSIS — C50519 Malignant neoplasm of lower-outer quadrant of unspecified female breast: Secondary | ICD-10-CM

## 2014-01-23 DIAGNOSIS — R5383 Other fatigue: Secondary | ICD-10-CM

## 2014-01-23 DIAGNOSIS — D72819 Decreased white blood cell count, unspecified: Secondary | ICD-10-CM

## 2014-01-23 DIAGNOSIS — R5381 Other malaise: Secondary | ICD-10-CM

## 2014-01-23 DIAGNOSIS — D702 Other drug-induced agranulocytosis: Secondary | ICD-10-CM

## 2014-01-23 LAB — COMPREHENSIVE METABOLIC PANEL (CC13)
ALT: 20 U/L (ref 0–55)
ANION GAP: 9 meq/L (ref 3–11)
AST: 17 U/L (ref 5–34)
Albumin: 3.1 g/dL — ABNORMAL LOW (ref 3.5–5.0)
Alkaline Phosphatase: 96 U/L (ref 40–150)
BUN: 16.1 mg/dL (ref 7.0–26.0)
CALCIUM: 9.5 mg/dL (ref 8.4–10.4)
CHLORIDE: 101 meq/L (ref 98–109)
CO2: 26 meq/L (ref 22–29)
Creatinine: 0.8 mg/dL (ref 0.6–1.1)
Glucose: 109 mg/dl (ref 70–140)
Potassium: 3.7 mEq/L (ref 3.5–5.1)
SODIUM: 136 meq/L (ref 136–145)
TOTAL PROTEIN: 6.5 g/dL (ref 6.4–8.3)
Total Bilirubin: 0.38 mg/dL (ref 0.20–1.20)

## 2014-01-23 LAB — CBC WITH DIFFERENTIAL/PLATELET
BASO%: 1.8 % (ref 0.0–2.0)
Basophils Absolute: 0.1 10*3/uL (ref 0.0–0.1)
EOS%: 2.9 % (ref 0.0–7.0)
Eosinophils Absolute: 0.1 10*3/uL (ref 0.0–0.5)
HCT: 37.9 % (ref 34.8–46.6)
HGB: 12.6 g/dL (ref 11.6–15.9)
LYMPH#: 1.1 10*3/uL (ref 0.9–3.3)
LYMPH%: 40.1 % (ref 14.0–49.7)
MCH: 29.4 pg (ref 25.1–34.0)
MCHC: 33.2 g/dL (ref 31.5–36.0)
MCV: 88.6 fL (ref 79.5–101.0)
MONO#: 0.8 10*3/uL (ref 0.1–0.9)
MONO%: 30 % — ABNORMAL HIGH (ref 0.0–14.0)
NEUT%: 25.2 % — ABNORMAL LOW (ref 38.4–76.8)
NEUTROS ABS: 0.7 10*3/uL — AB (ref 1.5–6.5)
Platelets: 243 10*3/uL (ref 145–400)
RBC: 4.28 10*6/uL (ref 3.70–5.45)
RDW: 12.6 % (ref 11.2–14.5)
WBC: 2.8 10*3/uL — AB (ref 3.9–10.3)

## 2014-01-23 NOTE — Telephone Encounter (Signed)
, °

## 2014-01-23 NOTE — Telephone Encounter (Signed)
[facility] Pharmacist called with question about today's levaquin order.  Reads "dispense as written".  Asked if they may fill with generic.  Instructed to use generic.

## 2014-01-23 NOTE — Progress Notes (Signed)
Martha Evans [id number] 1946-08-01 68 y.o. 01/23/2014 5:48 PM  CC  [name], MD [address] [place] [address] Dr. Armandina Gemma Dr. Thea Silversmith   Chief complaint: [name] is here for her followup visit after first cycle of chemotherapy with Taxotere and Cytoxan  Chemotherapy regimen: Adjuvant Docetaxel and cyclophosphamide   STAGE:   Breast cancer of lower-outer quadrant of right female breast   Primary site: Breast (Right)   Staging method: AJCC 7th Edition   Pathologic: Stage IA (T1, NX, cM0) signed by [name], MD on 12/21/2013  4:49 PM   Summary: Stage IA (T1, NX, cM0)  REFERRING PHYSICIAN: Dr. Armandina Gemma  HISTORY OF PRESENT ILLNESS:  Martha Evans is a 68 y.o. female.  Whose medical history significant for hypertension gastroesophageal reflux disease. Patient underwent bilateral breast reductions in the 1980s. In February 2015 she had screening mammograms performed that showed a mass in the central right breast. She had ultrasound performed which revealed a 6 x 4 x 6 mm high-grade calcifications. Biopsy of these calcifications revealed invasive ductal carcinoma. On 12/12/2013 patient underwent a lumpectomy without sentinel lymph node biopsy. 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 Tumor was ER +2% PR +2% HER-2/neu negative with an elevated Ki-67 of 84%. Of note patient undergo a sentinel lymph node biopsy because of previous breast reduction. She was seen in the breast cancer clinic and recommendations were made  to proceed with chemotherapy. She was also evaluated by radiation oncology    INTERVAL HISTORY: [name] is here for followup visit today after completion of first cycle of chemotherapy with docetaxel and cytoxan  Received on April  20th. followed by received Neulasta injection on 01/17/2014. She tolerated chemotherapy quite well. She does complain of dry mouth.  she took  MiraLAX  for constipation since then she's been having diarrhea 3-5 times daily and slowly getting better. She also complains of fatigue since the time of her chemotherapy. She has Imodium to take as needed. She does not complain of any fever, constipation, blood in the stool or  blood in the urine. Denies any weight loss, shortness of breath, cough, palpitations or chest pains  Past Medical History: Past Medical History  Diagnosis Date  . Hypertension   . Allergy   . Wears hearing aid     right  . GERD (gastroesophageal reflux disease)   . Complication of anesthesia     was hard to intubate with thyroid surgery-99  . Difficult intubation 1999    when had thyroid lobectomy  . Dysrhythmia     irregular reuglar heart beat   . Cancer     breast cancer     Past Surgical History: Past Surgical History  Procedure Laterality Date  . Thyroid lobectomy  1999    right  . Rotator cuff repair  2006    right shoulder  . Breast reduction surgery Bilateral     1980's  . Abdominal hysterectomy  1999  . Tonsillectomy      as child  . Appendectomy      as child  . Colonoscopy    . Partial mastectomy with needle localization and axillary sentinel lymph node bx Right 12/12/2013    Procedure: PARTIAL MASTECTOMY WITH NEEDLE LOCALIZATION AND AXILLARY SENTINEL LYMPH NODE BX;  Surgeon: Earnstine Regal, MD;  Location: [facility];  Service: General;  Laterality: Right;  . Portacath placement Left 01/02/2014  Procedure: INSERTION PORT-A-CATH;  Surgeon: Earnstine Regal, MD;  Location: [facility];  Service: General;  Laterality: Left;    Family History: Family History  Problem Relation Age of Onset  . Cancer Father     bladder & pancreatic    Social History History  Substance Use Topics  . Smoking status: Never Smoker   . Smokeless tobacco: Never Used  . Alcohol Use: Yes     Comment: seldom    Allergies: Allergies  Allergen Reactions  . Codeine Sulfate Nausea And Vomiting  . Tramadol  Nausea Only    Nausea     Current Medications: Current Outpatient Prescriptions  Medication Sig Dispense Refill  . dexamethasone (DECADRON) 4 MG tablet Take 2 tablets (8 mg total) by mouth 2 (two) times daily. Start the day before Taxotere. Then again the day after chemo for 3 days.  30 tablet  1  . lidocaine-prilocaine (EMLA) cream Apply topically as needed.  30 g  6  . loratadine (CLARITIN) 10 MG tablet Take 10 mg by mouth daily. lst dose 12/11/13      . LORazepam (ATIVAN) 0.5 MG tablet Take 1 tablet (0.5 mg total) by mouth every 6 (six) hours as needed (Nausea or vomiting).  30 tablet  0  . omeprazole (PRILOSEC) 20 MG capsule Take 20 mg by mouth daily.      . ondansetron (ZOFRAN) 8 MG tablet Take 1 tablet (8 mg total) by mouth 2 (two) times daily. Start the day after chemo for 3 days. Then take as needed for nausea or vomiting.  30 tablet  1  . telmisartan (MICARDIS) 40 MG tablet Take 40 mg by mouth daily.      Marland Kitchen acetaminophen (TYLENOL) 325 MG tablet Take 650 mg by mouth every 6 (six) hours as needed.      . calcium carbonate (TUMS - DOSED IN MG ELEMENTAL CALCIUM) 500 MG chewable tablet Chew 1 tablet by mouth daily. As needed      . calcium-vitamin D (OSCAL WITH D) 500-200 MG-UNIT per tablet Take 1 tablet by mouth daily.       . prochlorperazine (COMPAZINE) 10 MG tablet Take 1 tablet (10 mg total) by mouth every 6 (six) hours as needed (Nausea or vomiting).  30 tablet  1  . UNABLE TO FIND Apply 1 application topically as needed. Med Name: cranial prosthesis chemotherapy induced alopecia  1 application  0   No current facility-administered medications for this visit.    OB/GYN History: menarche at 89, menopause at 84 (surgical) HRT x 10 years, GxP0  Fertility Discussion: N/A Prior History of Cancer: no  Health Maintenance:  Colonoscopy yes Bone Density 2013  Last PAP smear no  ECOG PERFORMANCE STATUS: 0 - Asymptomatic  Genetic Counseling/testing: no  REVIEW OF SYSTEMS:  A  comprehensive review of systems was negative. other than the diarrhea and fatigue  PHYSICAL EXAMINATION: Blood pressure 143/77, pulse 93, temperature 98 F (36.7 C), temperature source Oral, resp. rate 18, height 5' 6"  (1.676 m), weight 183 lb 1.6 oz (83.054 kg).  General:  well-nourished in no acute distress.   Eyes:  no scleral icterus.   ENT:  There were no oropharyngeal lesions.  Neck was without thyromegaly.   Lymphatics:  Negative cervical, supraclavicular or axillary adenopathy.   Respiratory: lungs were clear bilaterally without wheezing or crackles.   Cardiovascular:  Regular rate and rhythm, S1/S2, without murmur, rub or gallop.  There was no pedal edema.   GI:  abdomen was  soft, flat, nontender, nondistended, without organomegaly.   Muscoloskeletal:  no spinal tenderness of palpation of vertebral spine.   Skin exam was without echymosis, petichae.   Neuro exam was nonfocal.  Patient was able to get on and off exam table without assistance.  Gait was normal.  Patient was alerted and oriented.  Attention was good.   Language was appropriate.  Mood was normal without depression.  Speech was not pressured.  Thought content was not tangential.   Breasts: right breast normal without mass, skin or nipple changes or axillary nodes with well healing lumpectomy scar, left breast normal without mass, skin or nipple changes or axillary nodes.   STUDIES/RESULTS: Nm Sentinel Node Inj-no Rpt (breast)  12/12/2013   CLINICAL DATA: invasive ductal carcinoma right breast   Sulfur colloid was injected intradermally by the nuclear medicine  technologist for breast cancer sentinel node localization.    Mm Digital Diagnostic Unilat R  12/01/2013   CLINICAL DATA:  New indeterminate 6 mm mass in the slight outer right breast which was biopsied under ultrasound guidance earlier same date.  EXAM: POST-BIOPSY CLIP PLACEMENT RIGHT DIAGNOSTIC MAMMOGRAM  COMPARISON:  Previous exams.  FINDINGS: Films are  performed following ultrasound guided biopsy of a new indeterminate 6 mm mass in the slight outer right breast. The ribbon tissue marker clip is appropriately positioned at the inferior edge of the mass. Expected post biopsy hemorrhage is present without evidence of hematoma  IMPRESSION: Appropriate positioning of the ribbon tissue marker clip at the inferior edge of the 6 mm mass in the slight outer right breast.  Final Assessment: Post Procedure Mammograms for Marker Placement   Electronically Signed   By: Evangeline Dakin M.D.   On: 12/01/2013 13:31   Mm Digital Diagnostic Unilat R  12/01/2013   CLINICAL DATA:  Callback from screening mammography, possible right breast mass.  EXAM: DIGITAL DIAGNOSTIC  RIGHT MAMMOGRAM  ULTRASOUND RIGHT BREAST  COMPARISON:  Mammography 11/14/2013, 11/05/2012, dating back to 10/15/2007. No prior right breast ultrasound.  ACR Breast Density Category b: There are scattered areas of fibroglandular density.  FINDINGS: Spot compression CC and MLO views of the area of concern in the central right breast, middle 1/3, were obtained. These confirm a partially obscured mass with associated faint microcalcifications but no associated architectural distortion. The mass was not present on the prior mammograms.  On physical exam, there is no palpable abnormality in the right breast. A surgical scar from previous breast reduction is present.  Ultrasound is performed, showing an oval-shaped mass with irregularity along its posterior border at the 8:30 o'clock position of the right breast approximately 4 cm from the nipple, measuring approximately 6 x 4 x 6 mm, containing a microcalcification. There is no associated acoustic shadowing, but there is internal blood flow on power Doppler evaluation.  IMPRESSION: Indeterminate 6 mm mass at the 8:30 o'clock position of the right breast approximately 4 cm from the nipple.  RECOMMENDATION: Ultrasound-guided core needle biopsy of the indeterminate mass is  recommended. The procedure was discussed with the patient in detail. She has agreed to proceed. This was performed subsequently and is reported separately.  I have discussed the findings and recommendations with the patient. Results were also provided in writing at the conclusion of the visit.  BI-RADS CATEGORY  4: Suspicious abnormality - biopsy should be considered.   Electronically Signed   By: Evangeline Dakin M.D.   On: 12/01/2013 10:59   US Breast Ltd Uni Right Inc Axilla  12/01/2013   CLINICAL DATA:  Callback from screening mammography, possible right breast mass.  EXAM: DIGITAL DIAGNOSTIC  RIGHT MAMMOGRAM  ULTRASOUND RIGHT BREAST  COMPARISON:  Mammography 11/14/2013, 11/05/2012, dating back to 10/15/2007. No prior right breast ultrasound.  ACR Breast Density Category b: There are scattered areas of fibroglandular density.  FINDINGS: Spot compression CC and MLO views of the area of concern in the central right breast, middle 1/3, were obtained. These confirm a partially obscured mass with associated faint microcalcifications but no associated architectural distortion. The mass was not present on the prior mammograms.  On physical exam, there is no palpable abnormality in the right breast. A surgical scar from previous breast reduction is present.  Ultrasound is performed, showing an oval-shaped mass with irregularity along its posterior border at the 8:30 o'clock position of the right breast approximately 4 cm from the nipple, measuring approximately 6 x 4 x 6 mm, containing a microcalcification. There is no associated acoustic shadowing, but there is internal blood flow on power Doppler evaluation.  IMPRESSION: Indeterminate 6 mm mass at the 8:30 o'clock position of the right breast approximately 4 cm from the nipple.  RECOMMENDATION: Ultrasound-guided core needle biopsy of the indeterminate mass is recommended. The procedure was discussed with the patient in detail. She has agreed to proceed. This was  performed subsequently and is reported separately.  I have discussed the findings and recommendations with the patient. Results were also provided in writing at the conclusion of the visit.  BI-RADS CATEGORY  4: Suspicious abnormality - biopsy should be considered.   Electronically Signed   By: Evangeline Dakin M.D.   On: 12/01/2013 10:59   [place] Rt Breast Bx W Loc Dev 1st Lesion Img Bx Spec US Guide  12/02/2013   ADDENDUM REPORT: 12/02/2013 12:58  ADDENDUM: PATHOLOGY: Invasive ductal carcinoma.  CONCORDANT:  Yes.  I discussed these results and the recommendations below with the patient by telephone on 12/02/2013 at 1045 hr. All of her questions were answered. She denies significant pain or bleeding at the biopsy site.  RECOMMENDATION: Surgical consultation for excision. An appointment has been made with Dr. Harlow Asa of [facility] on March 11 at 10 o'clock a.m.   Electronically Signed   By: Evangeline Dakin M.D.   On: 12/02/2013 12:58   12/02/2013   CLINICAL DATA:  Indeterminate 6 mm mass identified on recent screening mammogram and diagnostic workup earlier today.  EXAM: ULTRASOUND GUIDED RIGHT BREAST CORE NEEDLE BIOPSY WITH VACUUM ASSIST  COMPARISON:  Previous exams.  PROCEDURE: I met with the patient and we discussed the procedure of ultrasound-guided biopsy, including benefits and alternatives. We discussed the high likelihood of a successful procedure. We discussed the risks of the procedure including infection, bleeding, tissue injury, clip migration, and inadequate sampling. Informed written consent was given. The usual time-out protocol was performed immediately prior to the procedure.  Using sterile technique and 2% Lidocaine as local anesthetic, under direct ultrasound visualization, a 12 gauge vacuum-assisteddevice was used to perform biopsy of the 6 mm mass at the 8:30 o'clock position of the right breast approximately 4 cm from the nipple using an inferior approach. At the conclusion of the  procedure, a ribbon shaped tissue marker clip was deployed into the biopsy cavity. Follow-up 2-view mammogram was performed and dictated separately.  IMPRESSION: Ultrasound-guided biopsy of an indeterminate 6 mm mass in the outer right breast. No apparent complications.  Electronically Signed: By: Evangeline Dakin M.D. On: 12/01/2013 13:26   Mm  Rt Plc Breast Loc Dev   1st Lesion  Inc Mammo Guide  12/12/2013   CLINICAL DATA:  Recent diagnosis of a 6 mm invasive ductal carcinoma in the right breast. Localization prior to right breast lumpectomy.  EXAM: NEEDLE LOCALIZATION OF THE RIGHT BREAST WITH MAMMO GUIDANCE  COMPARISON:  Previous exams.  FINDINGS: Patient presents for needle localization prior to right breast lumpectomy. I met with the patient and we discussed the procedure of needle localization including benefits and alternatives. We discussed the high likelihood of a successful procedure. We discussed the risks of the procedure, including infection, bleeding, tissue injury, and further surgery. Informed, written consent was given. The usual time-out protocol was performed immediately prior to the procedure.  Using mammographic guidance, sterile technique, 2% lidocaine and a 9 cm modified Kopans needle, the 6 mm mass and the ribbon tissue marker clip were localized using a lateral approach. The films were marked for Dr. Harlow Asa.  Specimen radiograph was performed at [facility] and confirms that the mass is present in the tissue sample. (The ribbon shaped tissue marker clip fell out of the sample during removal). The specimen was marked for pathology at [facility] on the grid. This was discussed with the nurse in the operating room on 12/12/2013 at 1305 hr.  IMPRESSION: Needle localization right breast. No apparent complications.   Electronically Signed   By: Evangeline Dakin M.D.   On: 12/12/2013 10:26     LABS:    Chemistry      Component Value Date/Time   NA 136 01/23/2014 1409   NA 140 01/02/2014  0548   K 3.7 01/23/2014 1409   K 3.9 01/02/2014 0548   CL 105 01/02/2014 0548   CO2 26 01/23/2014 1409   CO2 25 01/02/2014 0548   BUN 16.1 01/23/2014 1409   BUN 15 01/02/2014 0548   CREATININE 0.8 01/23/2014 1409   CREATININE 0.81 01/02/2014 0548      Component Value Date/Time   CALCIUM 9.5 01/23/2014 1409   CALCIUM 9.5 01/02/2014 0548   ALKPHOS 96 01/23/2014 1409   AST 17 01/23/2014 1409   ALT 20 01/23/2014 1409   BILITOT 0.38 01/23/2014 1409      Lab Results  Component Value Date   WBC 2.8* 01/23/2014   HGB 12.6 01/23/2014   HCT 37.9 01/23/2014   MCV 88.6 01/23/2014   PLT 243 01/23/2014    ASSESSMENT/PLAN     68 year old female with  #1 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 Tumor was ER +2% PR +2% HER-2/neu negative. Status post completion of first  cycle of adjuvant chemotherapy with docetaxel and cyclophosphamide on 01/16/2014 with myeloid growth factor support on 01/17/2014. She tolerated the chemotherapy quite well. Second  cycle of chemotherapy to start on 02/06/2014  #2 Diarrhea most likely secondary to laxatives compounded by side effects of Taxotere: It is improving at present. 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 I've asked the patient if she gets any fever or abdominal pain or chills to call our office and start taking Levaquin. I have given prescription of Levaquin 500 mg by mouth once daily to be taken for 5 days. I have instructed on neutropenic precautions.  #4 fatigue secondary to chemotherapy  Next follow up visit in 1 week to  continue to monitor the side effects of chemotherapy with CBC and differential and CMP on the day of next visit    All questions were answered. The patient knows to call the clinic with any problems, questions or  concerns. We can certainly see the patient much sooner if necessary.  I spent 20 minutes counseling the patient face to face. The total time spent in the appointment was 10 minutes.   Wilmon Arms, MD Medical/Oncology [facility] [phone] (Office)  01/23/2014, 5:48 PM

## 2014-01-24 ENCOUNTER — Telehealth (INDEPENDENT_AMBULATORY_CARE_PROVIDER_SITE_OTHER): Payer: Self-pay

## 2014-01-24 ENCOUNTER — Telehealth: Payer: Self-pay | Admitting: *Deleted

## 2014-01-24 ENCOUNTER — Ambulatory Visit: Payer: Self-pay | Admitting: Surgery

## 2014-01-24 NOTE — Telephone Encounter (Signed)
Received voice message from patient that she has been taking her temperature today and it ranges from 97.9 - 99.6. I discussed this with Dr. Earnest Conroy and she wants patient to go ahead and start taking Levaquin. Patient already had the prescription filled from the day before. I spoke with patient and told her to start taking the Levaquin today x 5 days. Call the office if she has any other questions or concerns. Patient verbalized understanding.

## 2014-01-24 NOTE — Telephone Encounter (Signed)
Signed [place] order faxed to Terri @ [phone]. (signed for Dr. Harlow Asa by Dr. Ninfa Linden)

## 2014-01-24 NOTE — Telephone Encounter (Signed)
Delayed entry:  Terri called from imaging center requesting a signed order for patient's [place] that was ordered by Dr. Harlow Asa.  Advised Terri that Dr. Harlow Asa is our DOW at the hospital and she said it would be okay for another provider to sign the order.  Dr. Ninfa Linden signed the order for [place].

## 2014-01-31 ENCOUNTER — Ambulatory Visit (INDEPENDENT_AMBULATORY_CARE_PROVIDER_SITE_OTHER): Payer: Medicare Other | Admitting: Surgery

## 2014-01-31 ENCOUNTER — Encounter (INDEPENDENT_AMBULATORY_CARE_PROVIDER_SITE_OTHER): Payer: Self-pay | Admitting: Surgery

## 2014-01-31 ENCOUNTER — Other Ambulatory Visit (HOSPITAL_BASED_OUTPATIENT_CLINIC_OR_DEPARTMENT_OTHER): Payer: Medicare Other

## 2014-01-31 ENCOUNTER — Ambulatory Visit (HOSPITAL_BASED_OUTPATIENT_CLINIC_OR_DEPARTMENT_OTHER): Payer: Medicare Other | Admitting: Hematology and Oncology

## 2014-01-31 VITALS — BP 130/65 | HR 81 | Temp 98.5°F | Resp 18 | Ht 66.0 in | Wt 188.1 lb

## 2014-01-31 VITALS — BP 124/80 | HR 72 | Temp 97.5°F | Resp 16 | Ht 66.0 in | Wt 187.0 lb

## 2014-01-31 DIAGNOSIS — C50511 Malignant neoplasm of lower-outer quadrant of right female breast: Secondary | ICD-10-CM

## 2014-01-31 DIAGNOSIS — C50919 Malignant neoplasm of unspecified site of unspecified female breast: Secondary | ICD-10-CM

## 2014-01-31 DIAGNOSIS — R5383 Other fatigue: Secondary | ICD-10-CM

## 2014-01-31 DIAGNOSIS — C50519 Malignant neoplasm of lower-outer quadrant of unspecified female breast: Secondary | ICD-10-CM

## 2014-01-31 DIAGNOSIS — R5381 Other malaise: Secondary | ICD-10-CM

## 2014-01-31 LAB — CBC WITH DIFFERENTIAL/PLATELET
BASO%: 0.7 % (ref 0.0–2.0)
Basophils Absolute: 0.1 10*3/uL (ref 0.0–0.1)
EOS%: 0.1 % (ref 0.0–7.0)
Eosinophils Absolute: 0 10*3/uL (ref 0.0–0.5)
HEMATOCRIT: 34.6 % — AB (ref 34.8–46.6)
HGB: 11.2 g/dL — ABNORMAL LOW (ref 11.6–15.9)
LYMPH#: 1.8 10*3/uL (ref 0.9–3.3)
LYMPH%: 11 % — ABNORMAL LOW (ref 14.0–49.7)
MCH: 29.1 pg (ref 25.1–34.0)
MCHC: 32.5 g/dL (ref 31.5–36.0)
MCV: 89.4 fL (ref 79.5–101.0)
MONO#: 1.4 10*3/uL — ABNORMAL HIGH (ref 0.1–0.9)
MONO%: 8.5 % (ref 0.0–14.0)
NEUT#: 12.9 10*3/uL — ABNORMAL HIGH (ref 1.5–6.5)
NEUT%: 79.7 % — AB (ref 38.4–76.8)
Platelets: 190 10*3/uL (ref 145–400)
RBC: 3.87 10*6/uL (ref 3.70–5.45)
RDW: 13.1 % (ref 11.2–14.5)
WBC: 16.2 10*3/uL — ABNORMAL HIGH (ref 3.9–10.3)

## 2014-01-31 LAB — COMPREHENSIVE METABOLIC PANEL (CC13)
ALT: 39 U/L (ref 0–55)
AST: 28 U/L (ref 5–34)
Albumin: 3.1 g/dL — ABNORMAL LOW (ref 3.5–5.0)
Alkaline Phosphatase: 186 U/L — ABNORMAL HIGH (ref 40–150)
Anion Gap: 9 mEq/L (ref 3–11)
BUN: 12.8 mg/dL (ref 7.0–26.0)
CALCIUM: 9.5 mg/dL (ref 8.4–10.4)
CHLORIDE: 106 meq/L (ref 98–109)
CO2: 25 mEq/L (ref 22–29)
CREATININE: 0.8 mg/dL (ref 0.6–1.1)
Glucose: 99 mg/dl (ref 70–140)
Potassium: 3.9 mEq/L (ref 3.5–5.1)
Sodium: 140 mEq/L (ref 136–145)
Total Bilirubin: 0.3 mg/dL (ref 0.20–1.20)
Total Protein: 6.8 g/dL (ref 6.4–8.3)

## 2014-01-31 LAB — MAGNESIUM (CC13): Magnesium: 2.2 mg/dl (ref 1.5–2.5)

## 2014-01-31 NOTE — Progress Notes (Signed)
Martha Evans [id number] January 17, 1946 69 y.o. 01/31/2014 5:37 PM  CC  Martha Reichert, MD [address] [place] [address] Dr. Armandina Evans Dr. Thea Evans   Chief complaint: Martha Evans is here for her followup visit after first cycle of chemotherapy with Taxotere and Cytoxan  Chemotherapy regimen: Adjuvant Docetaxel and cyclophosphamide   STAGE:   Breast cancer of lower-outer quadrant of right female breast   Primary site: Breast (Right)   Staging method: AJCC 7th Edition   Pathologic: Stage IA (T1, NX, cM0) signed by Martha Robinson, MD on 12/21/2013  4:49 PM   Summary: Stage IA (T1, NX, cM0)  REFERRING PHYSICIAN: Dr. Armandina Evans  HISTORY OF PRESENT ILLNESS: As per previously dictated note:  Martha Evans is a 68 y.o. female.  Whose medical history significant for hypertension gastroesophageal reflux disease. Patient underwent bilateral breast reductions in the 1980s. In February 2015 she had screening mammograms performed that showed a mass in the central right breast. She had ultrasound performed which revealed a 6 x 4 x 6 mm high-grade calcifications. Biopsy of these calcifications revealed invasive ductal carcinoma. On 12/12/2013 patient underwent a lumpectomy without sentinel lymph node biopsy. 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 Tumor was ER +2% PR +2% HER-2/neu negative with an elevated Ki-67 of 84%. Of note patient undergo a sentinel lymph node biopsy because of previous breast reduction. She was seen in the breast cancer clinic and recommendations were made  to proceed with chemotherapy. She was also evaluated by radiation oncology    INTERVAL HISTORY: Martha Evans is here for followup visit today after completion of first cycle of chemotherapy with docetaxel and cytoxan  Received on April  20th. followed by received Neulasta injection on 01/17/2014. She tolerated chemotherapy quite well. She does complain  of dry mouth and taste changes. Her diarrhea is resolved. She noticed a hair fall and she will be  getting wig piece.  Her neutropenia resolved today to 16.2 of WBC count. She completed 5 days of Levaquin yesterday. She does not complain of any fever,  blood in the stool or  blood in the urine. Denies any weight loss, shortness of breath, cough, palpitations or chest pains. She was also seen by surgery today and was told lumpectomy scar is in good healing stage and given an  appointment to see  Dr. Harlow Evans in 6 months  Past Medical History: Past Medical History  Diagnosis Date  . Hypertension   . Allergy   . Wears hearing aid     right  . GERD (gastroesophageal reflux disease)   . Complication of anesthesia     was hard to intubate with thyroid surgery-99  . Difficult intubation 1999    when had thyroid lobectomy  . Dysrhythmia     irregular reuglar heart beat   . Cancer     breast cancer     Past Surgical History: Past Surgical History  Procedure Laterality Date  . Thyroid lobectomy  1999    right  . Rotator cuff repair  2006    right shoulder  . Breast reduction surgery Bilateral     1980's  . Abdominal hysterectomy  1999  . Tonsillectomy      as child  . Appendectomy      as child  . Colonoscopy    . Partial mastectomy with needle localization and axillary sentinel lymph node bx Right 12/12/2013    Procedure: PARTIAL MASTECTOMY WITH NEEDLE  LOCALIZATION AND AXILLARY SENTINEL LYMPH NODE BX;  Surgeon: Earnstine Regal, MD;  Location: [facility];  Service: General;  Laterality: Right;  . Portacath placement Left 01/02/2014    Procedure: INSERTION PORT-A-CATH;  Surgeon: Earnstine Regal, MD;  Location: [facility];  Service: General;  Laterality: Left;    Family History: Family History  Problem Relation Age of Onset  . Cancer Father     bladder & pancreatic    Social History History  Substance Use Topics  . Smoking status: Never Smoker   . Smokeless tobacco: Never  Used  . Alcohol Use: Yes     Comment: seldom    Allergies: Allergies  Allergen Reactions  . Codeine Sulfate Nausea And Vomiting  . Tramadol Nausea Only    Nausea     Current Medications: Current Outpatient Prescriptions  Medication Sig Dispense Refill  . calcium carbonate (TUMS - DOSED IN MG ELEMENTAL CALCIUM) 500 MG chewable tablet Chew 1 tablet by mouth daily. As needed      . calcium-vitamin D (OSCAL WITH D) 500-200 MG-UNIT per tablet Take 1 tablet by mouth daily.       Marland Kitchen dexamethasone (DECADRON) 4 MG tablet Take 2 tablets (8 mg total) by mouth 2 (two) times daily. Start the day before Taxotere. Then again the day after chemo for 3 days.  30 tablet  1  . lidocaine-prilocaine (EMLA) cream Apply topically as needed.  30 g  6  . loratadine (CLARITIN) 10 MG tablet Take 10 mg by mouth daily. lst dose 12/11/13      . LORazepam (ATIVAN) 0.5 MG tablet Take 1 tablet (0.5 mg total) by mouth every 6 (six) hours as needed (Nausea or vomiting).  30 tablet  0  . omeprazole (PRILOSEC) 20 MG capsule Take 20 mg by mouth daily.      . ondansetron (ZOFRAN) 8 MG tablet Take 1 tablet (8 mg total) by mouth 2 (two) times daily. Start the day after chemo for 3 days. Then take as needed for nausea or vomiting.  30 tablet  1  . prochlorperazine (COMPAZINE) 10 MG tablet Take 1 tablet (10 mg total) by mouth every 6 (six) hours as needed (Nausea or vomiting).  30 tablet  1  . telmisartan (MICARDIS) 40 MG tablet Take 40 mg by mouth daily.       No current facility-administered medications for this visit.    OB/GYN History: menarche at 65, menopause at 31 (surgical) HRT x 10 years, GxP0  Fertility Discussion: N/A Prior History of Cancer: no  Health Maintenance:  Colonoscopy yes Bone Density 2013  Last PAP smear no  ECOG PERFORMANCE STATUS: 0 - Asymptomatic  Genetic Counseling/testing: no  REVIEW OF SYSTEMS: A 10 point review of symptoms were assessed and pertinent symptoms were mentioned in interval  history  PHYSICAL EXAMINATION: Blood pressure 130/65, pulse 81, temperature 98.5 F (36.9 C), temperature source Oral, resp. rate 18, height 5' 6"  (1.676 m), weight 188 lb 1.6 oz (85.322 kg).  General:  well-nourished in no acute distress.   Eyes:  no scleral icterus.   ENT:  There were no oropharyngeal lesions.  Neck was without thyromegaly.   Lymphatics:  Negative cervical, supraclavicular or axillary adenopathy.   Respiratory: lungs were clear bilaterally without wheezing or crackles.   Cardiovascular:  Regular rate and rhythm, S1/S2, without murmur, rub or gallop.  There was no pedal edema.   GI:  abdomen was soft, flat, nontender, nondistended, without organomegaly.   Muscoloskeletal:  no spinal tenderness of palpation of vertebral spine.   Skin exam was without echymosis, petichae.   Neuro exam was nonfocal.  Patient was able to get on and off exam table without assistance.  Gait was normal.  Patient was alerted and oriented.  Attention was good.   Language was appropriate.  Mood was normal without depression.  Speech was not pressured.  Thought content was not tangential.   Breasts: right breast normal without mass, skin or nipple changes or axillary nodes with well healing lumpectomy scar, left breast normal without mass, skin or nipple changes or axillary nodes.   STUDIES/RESULTS: Nm Sentinel Node Inj-no Rpt (breast)  12/12/2013   CLINICAL DATA: invasive ductal carcinoma right breast   Sulfur colloid was injected intradermally by the nuclear medicine  technologist for breast cancer sentinel node localization.    Mm Digital Diagnostic Unilat R  12/01/2013   CLINICAL DATA:  New indeterminate 6 mm mass in the slight outer right breast which was biopsied under ultrasound guidance earlier same date.  EXAM: POST-BIOPSY CLIP PLACEMENT RIGHT DIAGNOSTIC MAMMOGRAM  COMPARISON:  Previous exams.  FINDINGS: Films are performed following ultrasound guided biopsy of a new indeterminate 6 mm mass in  the slight outer right breast. The ribbon tissue marker clip is appropriately positioned at the inferior edge of the mass. Expected post biopsy hemorrhage is present without evidence of hematoma  IMPRESSION: Appropriate positioning of the ribbon tissue marker clip at the inferior edge of the 6 mm mass in the slight outer right breast.  Final Assessment: Post Procedure Mammograms for Marker Placement   Electronically Signed   By: Evangeline Dakin M.D.   On: 12/01/2013 13:31   Mm Digital Diagnostic Unilat R  12/01/2013   CLINICAL DATA:  Callback from screening mammography, possible right breast mass.  EXAM: DIGITAL DIAGNOSTIC  RIGHT MAMMOGRAM  ULTRASOUND RIGHT BREAST  COMPARISON:  Mammography 11/14/2013, 11/05/2012, dating back to 10/15/2007. No prior right breast ultrasound.  ACR Breast Density Category b: There are scattered areas of fibroglandular density.  FINDINGS: Spot compression CC and MLO views of the area of concern in the central right breast, middle 1/3, were obtained. These confirm a partially obscured mass with associated faint microcalcifications but no associated architectural distortion. The mass was not present on the prior mammograms.  On physical exam, there is no palpable abnormality in the right breast. A surgical scar from previous breast reduction is present.  Ultrasound is performed, showing an oval-shaped mass with irregularity along its posterior border at the 8:30 o'clock position of the right breast approximately 4 cm from the nipple, measuring approximately 6 x 4 x 6 mm, containing a microcalcification. There is no associated acoustic shadowing, but there is internal blood flow on power Doppler evaluation.  IMPRESSION: Indeterminate 6 mm mass at the 8:30 o'clock position of the right breast approximately 4 cm from the nipple.  RECOMMENDATION: Ultrasound-guided core needle biopsy of the indeterminate mass is recommended. The procedure was discussed with the patient in detail. She has  agreed to proceed. This was performed subsequently and is reported separately.  I have discussed the findings and recommendations with the patient. Results were also provided in writing at the conclusion of the visit.  BI-RADS CATEGORY  4: Suspicious abnormality - biopsy should be considered.   Electronically Signed   By: Evangeline Dakin M.D.   On: 12/01/2013 10:59   US Breast Ltd Uni Right Inc Axilla  12/01/2013   CLINICAL DATA:  Callback from screening mammography,  possible right breast mass.  EXAM: DIGITAL DIAGNOSTIC  RIGHT MAMMOGRAM  ULTRASOUND RIGHT BREAST  COMPARISON:  Mammography 11/14/2013, 11/05/2012, dating back to 10/15/2007. No prior right breast ultrasound.  ACR Breast Density Category b: There are scattered areas of fibroglandular density.  FINDINGS: Spot compression CC and MLO views of the area of concern in the central right breast, middle 1/3, were obtained. These confirm a partially obscured mass with associated faint microcalcifications but no associated architectural distortion. The mass was not present on the prior mammograms.  On physical exam, there is no palpable abnormality in the right breast. A surgical scar from previous breast reduction is present.  Ultrasound is performed, showing an oval-shaped mass with irregularity along its posterior border at the 8:30 o'clock position of the right breast approximately 4 cm from the nipple, measuring approximately 6 x 4 x 6 mm, containing a microcalcification. There is no associated acoustic shadowing, but there is internal blood flow on power Doppler evaluation.  IMPRESSION: Indeterminate 6 mm mass at the 8:30 o'clock position of the right breast approximately 4 cm from the nipple.  RECOMMENDATION: Ultrasound-guided core needle biopsy of the indeterminate mass is recommended. The procedure was discussed with the patient in detail. She has agreed to proceed. This was performed subsequently and is reported separately.  I have discussed the  findings and recommendations with the patient. Results were also provided in writing at the conclusion of the visit.  BI-RADS CATEGORY  4: Suspicious abnormality - biopsy should be considered.   Electronically Signed   By: Evangeline Dakin M.D.   On: 12/01/2013 10:59   [place] Rt Breast Bx W Loc Dev 1st Lesion Img Bx Spec US Guide  12/02/2013   ADDENDUM REPORT: 12/02/2013 12:58  ADDENDUM: PATHOLOGY: Invasive ductal carcinoma.  CONCORDANT:  Yes.  I discussed these results and the recommendations below with the patient by telephone on 12/02/2013 at 1045 hr. All of her questions were answered. She denies significant pain or bleeding at the biopsy site.  RECOMMENDATION: Surgical consultation for excision. An appointment has been made with Dr. Harlow Evans of [facility] on March 11 at 10 o'clock a.m.   Electronically Signed   By: Evangeline Dakin M.D.   On: 12/02/2013 12:58   12/02/2013   CLINICAL DATA:  Indeterminate 6 mm mass identified on recent screening mammogram and diagnostic workup earlier today.  EXAM: ULTRASOUND GUIDED RIGHT BREAST CORE NEEDLE BIOPSY WITH VACUUM ASSIST  COMPARISON:  Previous exams.  PROCEDURE: I met with the patient and we discussed the procedure of ultrasound-guided biopsy, including benefits and alternatives. We discussed the high likelihood of a successful procedure. We discussed the risks of the procedure including infection, bleeding, tissue injury, clip migration, and inadequate sampling. Informed written consent was given. The usual time-out protocol was performed immediately prior to the procedure.  Using sterile technique and 2% Lidocaine as local anesthetic, under direct ultrasound visualization, a 12 gauge vacuum-assisteddevice was used to perform biopsy of the 6 mm mass at the 8:30 o'clock position of the right breast approximately 4 cm from the nipple using an inferior approach. At the conclusion of the procedure, a ribbon shaped tissue marker clip was deployed into the biopsy  cavity. Follow-up 2-view mammogram was performed and dictated separately.  IMPRESSION: Ultrasound-guided biopsy of an indeterminate 6 mm mass in the outer right breast. No apparent complications.  Electronically Signed: By: Evangeline Dakin M.D. On: 12/01/2013 13:26   Mm Rt Plc Breast Loc Dev   1st Lesion  Inc Mammo Guide  12/12/2013   CLINICAL DATA:  Recent diagnosis of a 6 mm invasive ductal carcinoma in the right breast. Localization prior to right breast lumpectomy.  EXAM: NEEDLE LOCALIZATION OF THE RIGHT BREAST WITH MAMMO GUIDANCE  COMPARISON:  Previous exams.  FINDINGS: Patient presents for needle localization prior to right breast lumpectomy. I met with the patient and we discussed the procedure of needle localization including benefits and alternatives. We discussed the high likelihood of a successful procedure. We discussed the risks of the procedure, including infection, bleeding, tissue injury, and further surgery. Informed, written consent was given. The usual time-out protocol was performed immediately prior to the procedure.  Using mammographic guidance, sterile technique, 2% lidocaine and a 9 cm modified Kopans needle, the 6 mm mass and the ribbon tissue marker clip were localized using a lateral approach. The films were marked for Dr. Harlow Evans.  Specimen radiograph was performed at [facility] and confirms that the mass is present in the tissue sample. (The ribbon shaped tissue marker clip fell out of the sample during removal). The specimen was marked for pathology at [facility] on the grid. This was discussed with the nurse in the operating room on 12/12/2013 at 1305 hr.  IMPRESSION: Needle localization right breast. No apparent complications.   Electronically Signed   By: Evangeline Dakin M.D.   On: 12/12/2013 10:26     LABS:    Chemistry      Component Value Date/Time   NA 140 01/31/2014 1525   NA 140 01/02/2014 0548   K 3.9 01/31/2014 1525   K 3.9 01/02/2014 0548   CL 105 01/02/2014 0548    CO2 25 01/31/2014 1525   CO2 25 01/02/2014 0548   BUN 12.8 01/31/2014 1525   BUN 15 01/02/2014 0548   CREATININE 0.8 01/31/2014 1525   CREATININE 0.81 01/02/2014 0548      Component Value Date/Time   CALCIUM 9.5 01/31/2014 1525   CALCIUM 9.5 01/02/2014 0548   ALKPHOS 186* 01/31/2014 1525   AST 28 01/31/2014 1525   ALT 39 01/31/2014 1525   BILITOT 0.30 01/31/2014 1525      Lab Results  Component Value Date   WBC 16.2* 01/31/2014   HGB 11.2* 01/31/2014   HCT 34.6* 01/31/2014   MCV 89.4 01/31/2014   PLT 190 01/31/2014    ASSESSMENT/PLAN     68 year old female with  #1 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 Tumor was ER +2% PR +2% HER-2/neu negative. Status post completion of first  cycle of adjuvant chemotherapy with docetaxel and cyclophosphamide on 01/16/2014 with myeloid growth factor support on 01/17/2014. She tolerated the chemotherapy quite well. Second  cycle of chemotherapy to start on 02/06/2014  #2 Diarrhea secondary to laxatives compounded by side effects of Taxotere: Resolved  #3 leukopenia with neutropenia secondary to chemotherapy induced: Resolved. She received  Neulasta on 01/17/2014.  #4 fatigue secondary to chemotherapy-improved  #5 Alopecia: She is planning to get hair piece  Next follow up visit in 1 week with CBC and differential and CMP and for second cycle of chemotherapy with  TC on the day of next visit   All questions were answered. The patient knows to call the clinic with any problems, questions or concerns. We can certainly see the patient much sooner if necessary.  I spent 20 minutes counseling the patient face to face. The total time spent  in the appointment was 30 minutes.   Wilmon Arms, MD Medical/Oncology [facility] [phone] (Office)  01/31/2014, 5:37 PM

## 2014-01-31 NOTE — Progress Notes (Signed)
General Surgery [facility]  Chief Complaint  Patient presents with  . Routine Post Op    breast cancer, infusion port    HISTORY: Patient is a 69 year old female who underwent right partial mastectomy in March 2015 for breast carcinoma. She underwent placement of the infusion port in April 2015. She has now begun adjuvant chemotherapy. They planned 4 cycles to be followed by radiation treatment to the right breast. She returns today for wound check.  EXAM: Surgical wounds on the left chest wall and on the right breast have healed very nicely. No sign of infection. Moderate seroma right lateral breast which is asymptomatic.  IMPRESSION: Right breast carcinoma  PLAN: Patient will continue with chemotherapy. This will be followed by adjuvant radiation therapy.  Patient will return in 6 months for surgical evaluation.  Of note, the patient is followed for thyroid nodules. Ultrasound was performed last week at [facility]. 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  Left lobe has multiple nodules that are stable compared to prior studies.  No need for intervention at present.  Will continue to follow.  Earnstine Regal, MD, [facility]   Visit Diagnoses: 1. Breast cancer of lower-outer quadrant of right female breast

## 2014-02-06 ENCOUNTER — Encounter: Payer: Self-pay | Admitting: Adult Health

## 2014-02-06 ENCOUNTER — Ambulatory Visit (HOSPITAL_BASED_OUTPATIENT_CLINIC_OR_DEPARTMENT_OTHER): Payer: Medicare Other | Admitting: Adult Health

## 2014-02-06 ENCOUNTER — Other Ambulatory Visit (HOSPITAL_BASED_OUTPATIENT_CLINIC_OR_DEPARTMENT_OTHER): Payer: Medicare Other

## 2014-02-06 ENCOUNTER — Telehealth: Payer: Self-pay | Admitting: Adult Health

## 2014-02-06 ENCOUNTER — Ambulatory Visit (HOSPITAL_BASED_OUTPATIENT_CLINIC_OR_DEPARTMENT_OTHER): Payer: Medicare Other

## 2014-02-06 ENCOUNTER — Telehealth: Payer: Self-pay | Admitting: *Deleted

## 2014-02-06 VITALS — BP 119/76 | HR 92 | Temp 97.9°F | Resp 18 | Ht 66.0 in | Wt 184.1 lb

## 2014-02-06 DIAGNOSIS — Z5111 Encounter for antineoplastic chemotherapy: Secondary | ICD-10-CM

## 2014-02-06 DIAGNOSIS — C50519 Malignant neoplasm of lower-outer quadrant of unspecified female breast: Secondary | ICD-10-CM

## 2014-02-06 DIAGNOSIS — C50511 Malignant neoplasm of lower-outer quadrant of right female breast: Secondary | ICD-10-CM

## 2014-02-06 DIAGNOSIS — Z171 Estrogen receptor negative status [ER-]: Secondary | ICD-10-CM

## 2014-02-06 LAB — COMPREHENSIVE METABOLIC PANEL (CC13)
ALBUMIN: 3.1 g/dL — AB (ref 3.5–5.0)
ALT: 15 U/L (ref 0–55)
ANION GAP: 8 meq/L (ref 3–11)
AST: 11 U/L (ref 5–34)
Alkaline Phosphatase: 130 U/L (ref 40–150)
BUN: 15.3 mg/dL (ref 7.0–26.0)
CALCIUM: 9.5 mg/dL (ref 8.4–10.4)
CHLORIDE: 107 meq/L (ref 98–109)
CO2: 25 mEq/L (ref 22–29)
Creatinine: 0.8 mg/dL (ref 0.6–1.1)
Glucose: 108 mg/dl (ref 70–140)
POTASSIUM: 3.9 meq/L (ref 3.5–5.1)
Sodium: 140 mEq/L (ref 136–145)
TOTAL PROTEIN: 6.9 g/dL (ref 6.4–8.3)
Total Bilirubin: 0.34 mg/dL (ref 0.20–1.20)

## 2014-02-06 LAB — CBC WITH DIFFERENTIAL/PLATELET
BASO%: 0.6 % (ref 0.0–2.0)
Basophils Absolute: 0.1 10*3/uL (ref 0.0–0.1)
EOS%: 0.4 % (ref 0.0–7.0)
Eosinophils Absolute: 0 10*3/uL (ref 0.0–0.5)
HCT: 33.3 % — ABNORMAL LOW (ref 34.8–46.6)
HEMOGLOBIN: 11 g/dL — AB (ref 11.6–15.9)
LYMPH#: 1.2 10*3/uL (ref 0.9–3.3)
LYMPH%: 12.8 % — ABNORMAL LOW (ref 14.0–49.7)
MCH: 29.4 pg (ref 25.1–34.0)
MCHC: 33 g/dL (ref 31.5–36.0)
MCV: 89.1 fL (ref 79.5–101.0)
MONO#: 1 10*3/uL — ABNORMAL HIGH (ref 0.1–0.9)
MONO%: 10.8 % (ref 0.0–14.0)
NEUT#: 7.3 10*3/uL — ABNORMAL HIGH (ref 1.5–6.5)
NEUT%: 75.4 % (ref 38.4–76.8)
Platelets: 344 10*3/uL (ref 145–400)
RBC: 3.73 10*6/uL (ref 3.70–5.45)
RDW: 13.3 % (ref 11.2–14.5)
WBC: 9.7 10*3/uL (ref 3.9–10.3)

## 2014-02-06 MED ORDER — SODIUM CHLORIDE 0.9 % IJ SOLN
10.0000 mL | INTRAMUSCULAR | Status: DC | PRN
  Administered 2014-02-06: 10 mL
  Filled 2014-02-06: qty 10

## 2014-02-06 MED ORDER — DEXAMETHASONE SODIUM PHOSPHATE 20 MG/5ML IJ SOLN
20.0000 mg | Freq: Once | INTRAMUSCULAR | Status: AC
  Administered 2014-02-06: 20 mg via INTRAVENOUS

## 2014-02-06 MED ORDER — ONDANSETRON 16 MG/50ML IVPB (CHCC)
16.0000 mg | Freq: Once | INTRAVENOUS | Status: AC
  Administered 2014-02-06: 16 mg via INTRAVENOUS

## 2014-02-06 MED ORDER — SODIUM CHLORIDE 0.9 % IV SOLN
600.0000 mg/m2 | Freq: Once | INTRAVENOUS | Status: AC
  Administered 2014-02-06: 1180 mg via INTRAVENOUS
  Filled 2014-02-06: qty 59

## 2014-02-06 MED ORDER — DOCETAXEL CHEMO INJECTION 160 MG/16ML
75.0000 mg/m2 | Freq: Once | INTRAVENOUS | Status: AC
  Administered 2014-02-06: 150 mg via INTRAVENOUS
  Filled 2014-02-06: qty 15

## 2014-02-06 MED ORDER — HEPARIN SOD (PORK) LOCK FLUSH 100 UNIT/ML IV SOLN
500.0000 [IU] | Freq: Once | INTRAVENOUS | Status: AC | PRN
  Administered 2014-02-06: 500 [IU]
  Filled 2014-02-06: qty 5

## 2014-02-06 MED ORDER — DEXAMETHASONE SODIUM PHOSPHATE 20 MG/5ML IJ SOLN
INTRAMUSCULAR | Status: AC
  Filled 2014-02-06: qty 5

## 2014-02-06 MED ORDER — SODIUM CHLORIDE 0.9 % IV SOLN
Freq: Once | INTRAVENOUS | Status: AC
  Administered 2014-02-06: 12:00:00 via INTRAVENOUS

## 2014-02-06 MED ORDER — ONDANSETRON 16 MG/50ML IVPB (CHCC)
INTRAVENOUS | Status: AC
  Filled 2014-02-06: qty 16

## 2014-02-06 NOTE — Telephone Encounter (Signed)
, °

## 2014-02-06 NOTE — Progress Notes (Addendum)
ID: Martha Evans OB: [birth year]  MR#: [id number]  GLO#:[id number]  PCP: Martha Reichert, MD GYN:   SU: Dr. Armandina Evans OTHER MD: Dr. 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    BREAST CANCER HISTORY:  Patient underwent a screening mammogram in February, 2015 that demonstrated a central right breast mass. A biopsy was performed on 12/01/2013 and demonstrated essentially triple negative invasive ductal carcinoma of the right breast.    CURRENT THERAPY: Docetaxel/Cyclophosphamide cycle 2 day 1  INTERVAL HISTORY:  Patient is here today for evaluation prior to receiving cycle 2 day 1 of Docetaxel and Cyclophosphamide chemotherapy given in the adjuvant setting.  She receives this treatment on day 1 of a 21 day cycle with Neulasta given on day 2 for granulocyte support.  The patient is doing well today.  She is ready to proceed with cycle two of treatment.  She did have some constipation following cycle 1 of chemotherapy, however she has been taking a stool softener daily to prepare for this cycle.  Otherwise, she denies any recent fevers, chills, nausea, vomiting, constipation, diarrhea, numbness/tingling, mouth pain, skin changes, or any further concerns.    REVIEW OF SYSTEMS:A 10 point review of systems was conducted and is otherwise negative except for what is noted above.     PAST MEDICAL HISTORY: Past Medical History  Diagnosis Date  . Hypertension   . Allergy   . Wears hearing aid     right  . GERD (gastroesophageal reflux disease)   . Complication of anesthesia     was hard to intubate with thyroid surgery-99  . Difficult intubation 1999    when had thyroid lobectomy  . Dysrhythmia     irregular reuglar heart beat   . Cancer     breast cancer     PAST SURGICAL HISTORY: Past Surgical History  Procedure  Laterality Date  . Thyroid lobectomy  1999    right  . Rotator cuff repair  2006    right shoulder  . Breast reduction surgery Bilateral     1980's  . Abdominal hysterectomy  1999  . Tonsillectomy      as child  . Appendectomy      as child  . Colonoscopy    . Partial mastectomy with needle localization and axillary sentinel lymph node bx Right 12/12/2013    Procedure: PARTIAL MASTECTOMY WITH NEEDLE LOCALIZATION AND AXILLARY SENTINEL LYMPH NODE BX;  Surgeon: Martha Regal, MD;  Location: [facility];  Service: General;  Laterality: Right;  . Portacath placement Left 01/02/2014    Procedure: INSERTION PORT-A-CATH;  Surgeon: Martha Regal, MD;  Location: [facility];  Service: General;  Laterality: Left;    FAMILY HISTORY Family History  Problem Relation Age of Onset  . Cancer Father     bladder & pancreatic    GYNECOLOGIC HISTORY: Menarche at age 27, G62 P0, Patient was on HRT on estrogen patch x 10 years.  S/p TAH/BSO 1999.  No h/o abnormal pap smears, or sexually transmitted infections.    SOCIAL HISTORY: Patient is married to husband Martha Evans of 1 year.  They live in a house in [place], [place].  Patient is retired.    ADVANCED DIRECTIVES: In process.  Patient instructed to bring in copies.     HEALTH MAINTENANCE: History  Substance Use Topics  .  Smoking status: Never Smoker   . Smokeless tobacco: Never Used  . Alcohol Use: Yes     Comment: seldom   Mammogram: 10/2013 Colonoscopy: unsure of date Bone Density Scan: 2-3 years Pap Smear: s/p TAH/BSO Eye Exam: 1.5 years ago Vitamin D Level:  unsure Lipid Panel:  12/2012, slightly elevated.   Allergies  Allergen Reactions  . Codeine Sulfate Nausea And Vomiting  . Tramadol Nausea Only    Nausea     Current Outpatient Prescriptions  Medication Sig Dispense Refill  . calcium carbonate (TUMS - DOSED IN MG ELEMENTAL CALCIUM) 500 MG chewable tablet Chew 1 tablet by mouth daily. As needed      . calcium-vitamin D  (OSCAL WITH D) 500-200 MG-UNIT per tablet Take 1 tablet by mouth daily.       Marland Kitchen lidocaine-prilocaine (EMLA) cream Apply topically as needed.  30 g  6  . loratadine (CLARITIN) 10 MG tablet Take 10 mg by mouth daily. lst dose 12/11/13      . LORazepam (ATIVAN) 0.5 MG tablet Take 1 tablet (0.5 mg total) by mouth every 6 (six) hours as needed (Nausea or vomiting).  30 tablet  0  . omeprazole (PRILOSEC) 20 MG capsule Take 20 mg by mouth daily.      Marland Kitchen telmisartan (MICARDIS) 40 MG tablet Take 40 mg by mouth daily.      Marland Kitchen dexamethasone (DECADRON) 4 MG tablet Take 2 tablets (8 mg total) by mouth 2 (two) times daily. Start the day before Taxotere. Then again the day after chemo for 3 days.  30 tablet  1  . ondansetron (ZOFRAN) 8 MG tablet Take 1 tablet (8 mg total) by mouth 2 (two) times daily. Start the day after chemo for 3 days. Then take as needed for nausea or vomiting.  30 tablet  1  . prochlorperazine (COMPAZINE) 10 MG tablet Take 1 tablet (10 mg total) by mouth every 6 (six) hours as needed (Nausea or vomiting).  30 tablet  1   No current facility-administered medications for this visit.   Facility-Administered Medications Ordered in Other Visits  Medication Dose Route Frequency Provider Last Rate Last Dose  . sodium chloride 0.9 % injection 10 mL  10 mL Intracatheter PRN Martha Robinson, MD   10 mL at 02/06/14 1434    OBJECTIVE: Filed Vitals:   02/06/14 1102  BP: 119/76  Pulse: 92  Temp: 97.9 F (36.6 C)  Resp: 18     Body mass index is 29.73 kg/(m^2).     GENERAL: Patient is a well appearing female in no acute distress HEENT:  Sclerae anicteric.  Oropharynx clear and moist. No ulcerations or evidence of oropharyngeal candidiasis. Neck is supple.  NODES:  No cervical, supraclavicular, or axillary lymphadenopathy palpated.  BREAST EXAM:  Deferred. LUNGS:  Clear to auscultation bilaterally.  No wheezes or rhonchi. HEART:  Regular rate and rhythm. No murmur appreciated. ABDOMEN:  Soft,  nontender.  Positive, normoactive bowel sounds. No organomegaly palpated. MSK:  No focal spinal tenderness to palpation. Full range of motion bilaterally in the upper extremities. EXTREMITIES:  No peripheral edema.   SKIN:  Clear with no obvious rashes or skin changes. No nail dyscrasia. NEURO:  Nonfocal. Well oriented.  Appropriate affect. ECOG FS:1 - Symptomatic but completely ambulatory  LAB RESULTS:  CMP     Component Value Date/Time   NA 140 02/06/2014 1050   NA 140 01/02/2014 0548   K 3.9 02/06/2014 1050   K 3.9 01/02/2014 0548  CL 105 01/02/2014 0548   CO2 25 02/06/2014 1050   CO2 25 01/02/2014 0548   GLUCOSE 108 02/06/2014 1050   GLUCOSE 101* 01/02/2014 0548   BUN 15.3 02/06/2014 1050   BUN 15 01/02/2014 0548   CREATININE 0.8 02/06/2014 1050   CREATININE 0.81 01/02/2014 0548   CALCIUM 9.5 02/06/2014 1050   CALCIUM 9.5 01/02/2014 0548   PROT 6.9 02/06/2014 1050   ALBUMIN 3.1* 02/06/2014 1050   AST 11 02/06/2014 1050   ALT 15 02/06/2014 1050   ALKPHOS 130 02/06/2014 1050   BILITOT 0.34 02/06/2014 1050   GFRNONAA 74* 01/02/2014 0548   GFRAA 86* 01/02/2014 0548    I No results found for this basename: SPEP,  UPEP,   kappa and lambda light chains    Lab Results  Component Value Date   WBC 9.7 02/06/2014   NEUTROABS 7.3* 02/06/2014   HGB 11.0* 02/06/2014   HCT 33.3* 02/06/2014   MCV 89.1 02/06/2014   PLT 344 02/06/2014      Chemistry      Component Value Date/Time   NA 140 02/06/2014 1050   NA 140 01/02/2014 0548   K 3.9 02/06/2014 1050   K 3.9 01/02/2014 0548   CL 105 01/02/2014 0548   CO2 25 02/06/2014 1050   CO2 25 01/02/2014 0548   BUN 15.3 02/06/2014 1050   BUN 15 01/02/2014 0548   CREATININE 0.8 02/06/2014 1050   CREATININE 0.81 01/02/2014 0548      Component Value Date/Time   CALCIUM 9.5 02/06/2014 1050   CALCIUM 9.5 01/02/2014 0548   ALKPHOS 130 02/06/2014 1050   AST 11 02/06/2014 1050   ALT 15 02/06/2014 1050   BILITOT 0.34 02/06/2014 1050       No results found for this basename: LABCA2     No components found with this basename: LABCA125    No results found for this basename: INR,  in the last 168 hours  Urinalysis No results found for this basename: colorurine,  appearanceur,  labspec,  phurine,  glucoseu,  hgbur,  bilirubinur,  ketonesur,  proteinur,  urobilinogen,  nitrite,  leukocytesur    STUDIES: No results found.  ASSESSMENT: 68 y.o. 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    1. Patient underwent a right breast lumpectomy by Dr. Harlow Asa on 12/12/13.  Pathology revealed a 0.8cm invasive ductal carcinoma, with ER 2%, PR 2%, HER-2/neu neg.  A sentinel node biopsy was attempted, however due to a previous mammoplasty, the procedure was unsuccessful due to the dye not going into the lymph nodes.  The patient did undergo a right axillary ultrasound on 01/04/2014 that was negative for any lymphadenopathy.    2.  The patient was started on adjuvant chemotherapy consisting of Docetaxel and Cyclophosphamide given on day 1 of a 21 day cycle with Neulasta given on day 2 for granulocyte support.  Cycle 1 began on 01/16/14.  A total of 4 cycles are planned.    PLAN:   Mrs. Swaggerty is a pleasant 67 year old with triple negative breast cancer s/p lumpectomy who is here for evaluation prior to her second cycle of Docetaxel and Cyclophosphamide.  She is cycle 2 day 1 of chemotherapy.  Her labs are stable.  She is tolerating chemotherapy relatively well.  She will proceed with treatment today.  I did recommend that she go ahead and start taking a stool softener  daily to prevent any future constipation.    The patient will return tomorrow for Neulasta, and in one week for labs, and evaluation for chemotoxicities.   She knows to call us in the interim for any questions or concerns.  We can certainly see her sooner if needed.  I spent 25 minutes counseling the patient face to face.  The total  time spent in the appointment was 30 minutes.   Minette Headland, [place] [phone] 02/06/2014 4:10 PM

## 2014-02-06 NOTE — Patient Instructions (Signed)
[facility] Discharge Instructions for Patients Receiving Chemotherapy  Today you received the following chemotherapy agents Taxotere/Cytoxan  To help prevent nausea and vomiting after your treatment, we encourage you to take your nausea medication as needed   If you develop nausea and vomiting that is not controlled by your nausea medication, call the clinic.   BELOW ARE SYMPTOMS THAT SHOULD BE REPORTED IMMEDIATELY:  *FEVER GREATER THAN 100.5 F  *CHILLS WITH OR WITHOUT FEVER  NAUSEA AND VOMITING THAT IS NOT CONTROLLED WITH YOUR NAUSEA MEDICATION  *UNUSUAL SHORTNESS OF BREATH  *UNUSUAL BRUISING OR BLEEDING  TENDERNESS IN MOUTH AND THROAT WITH OR WITHOUT PRESENCE OF ULCERS  *URINARY PROBLEMS  *BOWEL PROBLEMS  UNUSUAL RASH Items with * indicate a potential emergency and should be followed up as soon as possible.  Feel free to call the clinic you have any questions or concerns. The clinic phone number is [phone].

## 2014-02-06 NOTE — Patient Instructions (Signed)
You are doing well.  Your labs are stable.  You will proceed with chemotherapy today.  Please call us if you have any questions or concerns.

## 2014-02-06 NOTE — Telephone Encounter (Signed)
Per staff phone call and POF I have schedueld appts.  JMW  

## 2014-02-07 ENCOUNTER — Ambulatory Visit (HOSPITAL_BASED_OUTPATIENT_CLINIC_OR_DEPARTMENT_OTHER): Payer: Medicare Other

## 2014-02-07 ENCOUNTER — Ambulatory Visit: Payer: Medicare Other

## 2014-02-07 ENCOUNTER — Telehealth: Payer: Self-pay | Admitting: *Deleted

## 2014-02-07 VITALS — BP 116/62 | HR 77 | Temp 97.5°F

## 2014-02-07 DIAGNOSIS — C50519 Malignant neoplasm of lower-outer quadrant of unspecified female breast: Secondary | ICD-10-CM

## 2014-02-07 DIAGNOSIS — C50511 Malignant neoplasm of lower-outer quadrant of right female breast: Secondary | ICD-10-CM

## 2014-02-07 DIAGNOSIS — Z5189 Encounter for other specified aftercare: Secondary | ICD-10-CM

## 2014-02-07 MED ORDER — PEGFILGRASTIM INJECTION 6 MG/0.6ML
6.0000 mg | Freq: Once | SUBCUTANEOUS | Status: AC
  Administered 2014-02-07: 6 mg via SUBCUTANEOUS
  Filled 2014-02-07: qty 0.6

## 2014-02-07 NOTE — Telephone Encounter (Signed)
Patient had a complaint "that right hand fingers were numb and tingling for a couple of hours last night. Some numbness in the toes but it went away after I took two Aleve." Per Mendel Ryder, NP, she suggested that patient start taking Super B Complex. This information was left on the patient's cell phone per her request. I instructed patient to call if she had any further questions.

## 2014-02-08 ENCOUNTER — Telehealth: Payer: Self-pay | Admitting: *Deleted

## 2014-02-08 NOTE — Telephone Encounter (Signed)
I have adjusted appts per staff message

## 2014-02-09 ENCOUNTER — Telehealth: Payer: Self-pay | Admitting: *Deleted

## 2014-02-09 NOTE — Telephone Encounter (Signed)
Patient called regarding her appts. I have fixed her appts

## 2014-02-10 ENCOUNTER — Inpatient Hospital Stay (HOSPITAL_COMMUNITY)
Admission: EM | Admit: 2014-02-10 | Discharge: 2014-02-24 | DRG: 853 | Disposition: A | Discharge status: Home Health | Payer: Medicare Other | Attending: Surgery | Admitting: Surgery

## 2014-02-10 ENCOUNTER — Encounter (HOSPITAL_COMMUNITY): Payer: Self-pay | Admitting: Emergency Medicine

## 2014-02-10 DIAGNOSIS — E2749 Other adrenocortical insufficiency: Secondary | ICD-10-CM | POA: Diagnosis present

## 2014-02-10 DIAGNOSIS — Z9221 Personal history of antineoplastic chemotherapy: Secondary | ICD-10-CM

## 2014-02-10 DIAGNOSIS — N179 Acute kidney failure, unspecified: Secondary | ICD-10-CM

## 2014-02-10 DIAGNOSIS — K668 Other specified disorders of peritoneum: Secondary | ICD-10-CM

## 2014-02-10 DIAGNOSIS — J189 Pneumonia, unspecified organism: Secondary | ICD-10-CM | POA: Diagnosis not present

## 2014-02-10 DIAGNOSIS — R6520 Severe sepsis without septic shock: Secondary | ICD-10-CM

## 2014-02-10 DIAGNOSIS — A419 Sepsis, unspecified organism: Principal | ICD-10-CM

## 2014-02-10 DIAGNOSIS — E46 Unspecified protein-calorie malnutrition: Secondary | ICD-10-CM | POA: Diagnosis present

## 2014-02-10 DIAGNOSIS — K5900 Constipation, unspecified: Secondary | ICD-10-CM | POA: Diagnosis present

## 2014-02-10 DIAGNOSIS — D72829 Elevated white blood cell count, unspecified: Secondary | ICD-10-CM

## 2014-02-10 DIAGNOSIS — Z9089 Acquired absence of other organs: Secondary | ICD-10-CM

## 2014-02-10 DIAGNOSIS — IMO0002 Reserved for concepts with insufficient information to code with codable children: Secondary | ICD-10-CM

## 2014-02-10 DIAGNOSIS — K560 Paralytic ileus: Secondary | ICD-10-CM | POA: Diagnosis present

## 2014-02-10 DIAGNOSIS — D500 Iron deficiency anemia secondary to blood loss (chronic): Secondary | ICD-10-CM | POA: Diagnosis present

## 2014-02-10 DIAGNOSIS — C50519 Malignant neoplasm of lower-outer quadrant of unspecified female breast: Secondary | ICD-10-CM | POA: Diagnosis present

## 2014-02-10 DIAGNOSIS — C50511 Malignant neoplasm of lower-outer quadrant of right female breast: Secondary | ICD-10-CM

## 2014-02-10 DIAGNOSIS — K219 Gastro-esophageal reflux disease without esophagitis: Secondary | ICD-10-CM | POA: Diagnosis present

## 2014-02-10 DIAGNOSIS — E871 Hypo-osmolality and hyponatremia: Secondary | ICD-10-CM | POA: Diagnosis present

## 2014-02-10 DIAGNOSIS — J9600 Acute respiratory failure, unspecified whether with hypoxia or hypercapnia: Secondary | ICD-10-CM | POA: Diagnosis not present

## 2014-02-10 DIAGNOSIS — K659 Peritonitis, unspecified: Secondary | ICD-10-CM | POA: Diagnosis present

## 2014-02-10 DIAGNOSIS — R062 Wheezing: Secondary | ICD-10-CM | POA: Diagnosis present

## 2014-02-10 DIAGNOSIS — T451X5A Adverse effect of antineoplastic and immunosuppressive drugs, initial encounter: Secondary | ICD-10-CM | POA: Diagnosis present

## 2014-02-10 DIAGNOSIS — Z9010 Acquired absence of unspecified breast and nipple: Secondary | ICD-10-CM

## 2014-02-10 DIAGNOSIS — D6481 Anemia due to antineoplastic chemotherapy: Secondary | ICD-10-CM | POA: Diagnosis present

## 2014-02-10 DIAGNOSIS — K630 Abscess of intestine: Secondary | ICD-10-CM | POA: Diagnosis present

## 2014-02-10 DIAGNOSIS — R609 Edema, unspecified: Secondary | ICD-10-CM | POA: Diagnosis present

## 2014-02-10 DIAGNOSIS — R6521 Severe sepsis with septic shock: Secondary | ICD-10-CM

## 2014-02-10 DIAGNOSIS — Z79899 Other long term (current) drug therapy: Secondary | ICD-10-CM

## 2014-02-10 DIAGNOSIS — I517 Cardiomegaly: Secondary | ICD-10-CM | POA: Diagnosis present

## 2014-02-10 DIAGNOSIS — I10 Essential (primary) hypertension: Secondary | ICD-10-CM | POA: Diagnosis present

## 2014-02-10 DIAGNOSIS — T380X5A Adverse effect of glucocorticoids and synthetic analogues, initial encounter: Secondary | ICD-10-CM | POA: Diagnosis present

## 2014-02-10 DIAGNOSIS — Z6831 Body mass index (BMI) 31.0-31.9, adult: Secondary | ICD-10-CM

## 2014-02-10 DIAGNOSIS — Z90710 Acquired absence of both cervix and uterus: Secondary | ICD-10-CM

## 2014-02-10 DIAGNOSIS — E876 Hypokalemia: Secondary | ICD-10-CM | POA: Diagnosis not present

## 2014-02-10 DIAGNOSIS — E875 Hyperkalemia: Secondary | ICD-10-CM | POA: Diagnosis present

## 2014-02-10 DIAGNOSIS — K631 Perforation of intestine (nontraumatic): Secondary | ICD-10-CM | POA: Diagnosis present

## 2014-02-10 DIAGNOSIS — K5732 Diverticulitis of large intestine without perforation or abscess without bleeding: Secondary | ICD-10-CM | POA: Diagnosis present

## 2014-02-10 NOTE — ED Notes (Addendum)
Pt arrived to the ED with a complaint of constipation that has been going on four days.  Pt also received a shot to increase her wbc on Tuesday in her left abdomen area which she is having pain intermittently of some severity.

## 2014-02-11 ENCOUNTER — Encounter (HOSPITAL_COMMUNITY): Admission: EM | Disposition: A | Discharge status: Home Health | Payer: Self-pay | Source: Home / Self Care

## 2014-02-11 ENCOUNTER — Emergency Department (HOSPITAL_COMMUNITY): Payer: Medicare Other | Admitting: Anesthesiology

## 2014-02-11 ENCOUNTER — Emergency Department (HOSPITAL_COMMUNITY): Payer: Medicare Other

## 2014-02-11 ENCOUNTER — Encounter (HOSPITAL_COMMUNITY): Payer: Medicare Other | Admitting: Anesthesiology

## 2014-02-11 DIAGNOSIS — K631 Perforation of intestine (nontraumatic): Secondary | ICD-10-CM | POA: Diagnosis present

## 2014-02-11 DIAGNOSIS — I959 Hypotension, unspecified: Secondary | ICD-10-CM

## 2014-02-11 DIAGNOSIS — K668 Other specified disorders of peritoneum: Secondary | ICD-10-CM

## 2014-02-11 HISTORY — PX: LAPAROTOMY: SHX154

## 2014-02-11 LAB — COMPREHENSIVE METABOLIC PANEL
ALT: 29 U/L (ref 0–35)
AST: 19 U/L (ref 0–37)
Albumin: 2 g/dL — ABNORMAL LOW (ref 3.5–5.2)
Alkaline Phosphatase: 96 U/L (ref 39–117)
BUN: 15 mg/dL (ref 6–23)
CALCIUM: 7.6 mg/dL — AB (ref 8.4–10.5)
CO2: 26 meq/L (ref 19–32)
CREATININE: 0.6 mg/dL (ref 0.50–1.10)
Chloride: 100 mEq/L (ref 96–112)
GFR calc non Af Amer: >90 mL/min (ref >90)
GLUCOSE: 165 mg/dL — AB (ref 70–99)
Potassium: 4.1 mEq/L (ref 3.7–5.3)
Sodium: 136 mEq/L — ABNORMAL LOW (ref 137–147)
TOTAL PROTEIN: 5.1 g/dL — AB (ref 6.0–8.3)
Total Bilirubin: 1.7 mg/dL — ABNORMAL HIGH (ref 0.3–1.2)

## 2014-02-11 LAB — MAGNESIUM: Magnesium: 1.9 mg/dL (ref 1.5–2.5)

## 2014-02-11 LAB — URINALYSIS, ROUTINE W REFLEX MICROSCOPIC
Bilirubin Urine: NEGATIVE
Glucose, UA: NEGATIVE mg/dL
Hgb urine dipstick: NEGATIVE
KETONES UR: NEGATIVE mg/dL
Leukocytes, UA: NEGATIVE
NITRITE: NEGATIVE
PH: 5.5 (ref 5.0–8.0)
Protein, ur: NEGATIVE mg/dL
SPECIFIC GRAVITY, URINE: 1.017 (ref 1.005–1.030)
UROBILINOGEN UA: 0.2 mg/dL (ref 0.0–1.0)

## 2014-02-11 LAB — CBC WITH DIFFERENTIAL/PLATELET
Basophils Absolute: 0.2 10*3/uL — ABNORMAL HIGH (ref 0.0–0.1)
Basophils Relative: 2 % — ABNORMAL HIGH (ref 0–1)
EOS PCT: 1 % (ref 0–5)
Eosinophils Absolute: 0.1 10*3/uL (ref 0.0–0.7)
HCT: 31 % — ABNORMAL LOW (ref 36.0–46.0)
Hemoglobin: 10.2 g/dL — ABNORMAL LOW (ref 12.0–15.0)
LYMPHS ABS: 0.6 10*3/uL — AB (ref 0.7–4.0)
Lymphocytes Relative: 7 % — ABNORMAL LOW (ref 12–46)
MCH: 29.3 pg (ref 26.0–34.0)
MCHC: 32.9 g/dL (ref 30.0–36.0)
MCV: 89.1 fL (ref 78.0–100.0)
MONOS PCT: 2 % — AB (ref 3–12)
Monocytes Absolute: 0.2 10*3/uL (ref 0.1–1.0)
Neutro Abs: 8.1 10*3/uL — ABNORMAL HIGH (ref 1.7–7.7)
Neutrophils Relative %: 88 % — ABNORMAL HIGH (ref 43–77)
PLATELETS: 255 10*3/uL (ref 150–400)
RBC: 3.48 MIL/uL — AB (ref 3.87–5.11)
RDW: 13.4 % (ref 11.5–15.5)
WBC: 9.2 10*3/uL (ref 4.0–10.5)

## 2014-02-11 LAB — CBC
HCT: 23.4 % — ABNORMAL LOW (ref 36.0–46.0)
HCT: 25.5 % — ABNORMAL LOW (ref 36.0–46.0)
HEMOGLOBIN: 7.6 g/dL — AB (ref 12.0–15.0)
Hemoglobin: 8.4 g/dL — ABNORMAL LOW (ref 12.0–15.0)
MCH: 30.3 pg (ref 26.0–34.0)
MCH: 29.8 pg (ref 26.0–34.0)
MCHC: 32.5 g/dL (ref 30.0–36.0)
MCHC: 32.9 g/dL (ref 30.0–36.0)
MCV: 93.2 fL (ref 78.0–100.0)
MCV: 90.4 fL (ref 78.0–100.0)
PLATELETS: 208 10*3/uL (ref 150–400)
Platelets: 171 10*3/uL (ref 150–400)
RBC: 2.51 MIL/uL — ABNORMAL LOW (ref 3.87–5.11)
RBC: 2.82 MIL/uL — ABNORMAL LOW (ref 3.87–5.11)
RDW: 13.9 % (ref 11.5–15.5)
RDW: 13.4 % (ref 11.5–15.5)
WBC: 3 10*3/uL — ABNORMAL LOW (ref 4.0–10.5)
WBC: 2.9 10*3/uL — AB (ref 4.0–10.5)

## 2014-02-11 LAB — I-STAT CHEM 8, ED
BUN: 23 mg/dL (ref 6–23)
CHLORIDE: 100 meq/L (ref 96–112)
Calcium, Ion: 1.12 mmol/L — ABNORMAL LOW (ref 1.13–1.30)
Creatinine, Ser: 0.8 mg/dL (ref 0.50–1.10)
Glucose, Bld: 109 mg/dL — ABNORMAL HIGH (ref 70–99)
HEMATOCRIT: 30 % — AB (ref 36.0–46.0)
Hemoglobin: 10.2 g/dL — ABNORMAL LOW (ref 12.0–15.0)
POTASSIUM: 3.7 meq/L (ref 3.7–5.3)
Sodium: 138 mEq/L (ref 137–147)
TCO2: 29 mmol/L (ref 0–100)

## 2014-02-11 LAB — PHOSPHORUS: PHOSPHORUS: 3 mg/dL (ref 2.3–4.6)

## 2014-02-11 LAB — GLUCOSE, CAPILLARY
GLUCOSE-CAPILLARY: 143 mg/dL — AB (ref 70–99)
Glucose-Capillary: 172 mg/dL — ABNORMAL HIGH (ref 70–99)

## 2014-02-11 LAB — TROPONIN I

## 2014-02-11 LAB — LACTIC ACID, PLASMA: Lactic Acid, Venous: 1.3 mmol/L (ref 0.5–2.2)

## 2014-02-11 LAB — HEMOGLOBIN AND HEMATOCRIT, BLOOD
HCT: 25.7 % — ABNORMAL LOW (ref 36.0–46.0)
Hemoglobin: 8.5 g/dL — ABNORMAL LOW (ref 12.0–15.0)

## 2014-02-11 SURGERY — LAPAROTOMY, EXPLORATORY
Anesthesia: General | Site: Abdomen

## 2014-02-11 MED ORDER — GLYCOPYRROLATE 0.2 MG/ML IJ SOLN
INTRAMUSCULAR | Status: AC
  Filled 2014-02-11: qty 3

## 2014-02-11 MED ORDER — DEXAMETHASONE SODIUM PHOSPHATE 10 MG/ML IJ SOLN
INTRAMUSCULAR | Status: DC | PRN
  Administered 2014-02-11: 10 mg via INTRAVENOUS

## 2014-02-11 MED ORDER — FENTANYL CITRATE 0.05 MG/ML IJ SOLN
INTRAMUSCULAR | Status: DC | PRN
  Administered 2014-02-11: 50 ug via INTRAVENOUS
  Administered 2014-02-11: 100 ug via INTRAVENOUS
  Administered 2014-02-11 (×2): 50 ug via INTRAVENOUS

## 2014-02-11 MED ORDER — PHENYLEPHRINE HCL 10 MG/ML IJ SOLN
INTRAMUSCULAR | Status: AC
  Filled 2014-02-11: qty 1

## 2014-02-11 MED ORDER — PIPERACILLIN-TAZOBACTAM 3.375 G IVPB 30 MIN
3.3750 g | Freq: Once | INTRAVENOUS | Status: AC
  Administered 2014-02-11: 3.375 g via INTRAVENOUS
  Filled 2014-02-11: qty 50

## 2014-02-11 MED ORDER — PIPERACILLIN-TAZOBACTAM 3.375 G IVPB 30 MIN
INTRAVENOUS | Status: DC | PRN
  Administered 2014-02-11: 3.375 g via INTRAVENOUS

## 2014-02-11 MED ORDER — DIPHENHYDRAMINE HCL 12.5 MG/5ML PO ELIX
12.5000 mg | ORAL_SOLUTION | Freq: Four times a day (QID) | ORAL | Status: DC | PRN
Start: 2014-02-11 — End: 2014-02-11

## 2014-02-11 MED ORDER — LACTATED RINGERS IV SOLN
INTRAVENOUS | Status: DC
  Administered 2014-02-11: 11:00:00 via INTRAVENOUS

## 2014-02-11 MED ORDER — ONDANSETRON HCL 4 MG/2ML IJ SOLN: 4.0000 mg | Freq: Four times a day (QID) | INTRAMUSCULAR | Status: DC | PRN

## 2014-02-11 MED ORDER — SODIUM CHLORIDE 0.9 % IV SOLN
100.0000 mg | Freq: Every day | INTRAVENOUS | Status: DC
  Administered 2014-02-11 – 2014-02-16 (×5): 100 mg via INTRAVENOUS
  Filled 2014-02-11 (×7): qty 100

## 2014-02-11 MED ORDER — MORPHINE SULFATE (PF) 1 MG/ML IV SOLN
INTRAVENOUS | Status: DC
  Administered 2014-02-12 (×2): 4.5 mg via INTRAVENOUS
  Administered 2014-02-12: 17:00:00 via INTRAVENOUS
  Administered 2014-02-12: 7.5 mg via INTRAVENOUS
  Administered 2014-02-13: 3 mg via INTRAVENOUS
  Administered 2014-02-13: 4.3 mg via INTRAVENOUS
  Administered 2014-02-13: 4.5 mg via INTRAVENOUS
  Administered 2014-02-13 (×2): 1.5 mg via INTRAVENOUS
  Administered 2014-02-14 (×2): 1 mg via INTRAVENOUS
  Administered 2014-02-14: 17:00:00 via INTRAVENOUS
  Administered 2014-02-15 (×2): 1 mg via INTRAVENOUS
  Administered 2014-02-15: 2 mg via INTRAVENOUS
  Administered 2014-02-15: 1 mg via INTRAVENOUS
  Administered 2014-02-16: 4 mg via INTRAVENOUS
  Administered 2014-02-16: 1 mg via INTRAVENOUS
  Administered 2014-02-16: 2 mg via INTRAVENOUS
  Filled 2014-02-11 (×2): qty 25

## 2014-02-11 MED ORDER — MIDAZOLAM HCL 2 MG/2ML IJ SOLN
INTRAMUSCULAR | Status: AC
  Filled 2014-02-11: qty 2

## 2014-02-11 MED ORDER — GLYCOPYRROLATE 0.2 MG/ML IJ SOLN
INTRAMUSCULAR | Status: DC | PRN
  Administered 2014-02-11: 0.6 mg via INTRAVENOUS

## 2014-02-11 MED ORDER — DEXTROSE 5 % IV SOLN
30.0000 ug/min | INTRAVENOUS | Status: DC
  Administered 2014-02-11: 30 ug/min via INTRAVENOUS
  Administered 2014-02-12: 100 ug/min via INTRAVENOUS
  Filled 2014-02-11 (×2): qty 1

## 2014-02-11 MED ORDER — NALOXONE HCL 0.4 MG/ML IJ SOLN: 0.4000 mg | INTRAMUSCULAR | Status: DC | PRN

## 2014-02-11 MED ORDER — MORPHINE SULFATE (PF) 1 MG/ML IV SOLN: INTRAVENOUS | Status: DC

## 2014-02-11 MED ORDER — HYDROCODONE-ACETAMINOPHEN 5-325 MG PO TABS: 1.0000 | ORAL_TABLET | ORAL | Status: DC | PRN

## 2014-02-11 MED ORDER — IOHEXOL 300 MG/ML  SOLN
100.0000 mL | Freq: Once | INTRAMUSCULAR | Status: AC | PRN
  Administered 2014-02-11: 100 mL via INTRAVENOUS

## 2014-02-11 MED ORDER — DIPHENHYDRAMINE HCL 50 MG/ML IJ SOLN: 12.5000 mg | Freq: Four times a day (QID) | INTRAMUSCULAR | Status: DC | PRN

## 2014-02-11 MED ORDER — KCL IN DEXTROSE-NACL 20-5-0.45 MEQ/L-%-% IV SOLN
INTRAVENOUS | Status: DC
  Administered 2014-02-11: 125 mL/h via INTRAVENOUS
  Filled 2014-02-11 (×2): qty 1000

## 2014-02-11 MED ORDER — PIPERACILLIN-TAZOBACTAM 3.375 G IVPB: 3.3750 g | Freq: Once | INTRAVENOUS | Status: DC

## 2014-02-11 MED ORDER — INSULIN ASPART 100 UNIT/ML ~~LOC~~ SOLN: 0.0000 [IU] | Freq: Every day | SUBCUTANEOUS | Status: DC

## 2014-02-11 MED ORDER — LACTATED RINGERS IV SOLN
INTRAVENOUS | Status: DC | PRN
  Administered 2014-02-11 (×2): via INTRAVENOUS

## 2014-02-11 MED ORDER — SODIUM CHLORIDE 0.9 % IR SOLN
Status: DC | PRN
  Administered 2014-02-11 (×8): 1000 mL

## 2014-02-11 MED ORDER — SODIUM CHLORIDE 0.9 % IJ SOLN: 9.0000 mL | INTRAMUSCULAR | Status: DC | PRN

## 2014-02-11 MED ORDER — ROCURONIUM BROMIDE 100 MG/10ML IV SOLN
INTRAVENOUS | Status: AC
  Filled 2014-02-11: qty 1

## 2014-02-11 MED ORDER — SUCCINYLCHOLINE CHLORIDE 20 MG/ML IJ SOLN
INTRAMUSCULAR | Status: DC | PRN
  Administered 2014-02-11: 100 mg via INTRAVENOUS

## 2014-02-11 MED ORDER — HYDROCORTISONE SOD SUCCINATE 100 MG IJ SOLR
50.0000 mg | Freq: Four times a day (QID) | INTRAMUSCULAR | Status: DC
  Administered 2014-02-11 – 2014-02-14 (×11): 50 mg via INTRAVENOUS
  Filled 2014-02-11 (×15): qty 1

## 2014-02-11 MED ORDER — EPHEDRINE SULFATE 50 MG/ML IJ SOLN
INTRAMUSCULAR | Status: DC | PRN
  Administered 2014-02-11: 10 mg via INTRAVENOUS

## 2014-02-11 MED ORDER — PHENYLEPHRINE 40 MCG/ML (10ML) SYRINGE FOR IV PUSH (FOR BLOOD PRESSURE SUPPORT)
PREFILLED_SYRINGE | INTRAVENOUS | Status: AC
  Filled 2014-02-11: qty 10

## 2014-02-11 MED ORDER — LIDOCAINE HCL (CARDIAC) 20 MG/ML IV SOLN
INTRAVENOUS | Status: AC
  Filled 2014-02-11: qty 5

## 2014-02-11 MED ORDER — LIDOCAINE HCL (CARDIAC) 20 MG/ML IV SOLN
INTRAVENOUS | Status: DC | PRN
  Administered 2014-02-11: 100 mg via INTRAVENOUS

## 2014-02-11 MED ORDER — HYDROMORPHONE HCL PF 1 MG/ML IJ SOLN
INTRAMUSCULAR | Status: AC
  Filled 2014-02-11: qty 1

## 2014-02-11 MED ORDER — SODIUM CHLORIDE 0.9 % IV BOLUS (SEPSIS)
1000.0000 mL | Freq: Once | INTRAVENOUS | Status: AC
  Administered 2014-02-11: 1000 mL via INTRAVENOUS

## 2014-02-11 MED ORDER — PROPOFOL 10 MG/ML IV BOLUS
INTRAVENOUS | Status: AC
  Filled 2014-02-11: qty 20

## 2014-02-11 MED ORDER — SODIUM CHLORIDE 0.9 % IJ SOLN
9.0000 mL | INTRAMUSCULAR | Status: DC | PRN
  Administered 2014-02-16: 10 mL via INTRAVENOUS

## 2014-02-11 MED ORDER — ONDANSETRON HCL 4 MG/2ML IJ SOLN
INTRAMUSCULAR | Status: DC | PRN
  Administered 2014-02-11: 4 mg via INTRAVENOUS

## 2014-02-11 MED ORDER — KCL IN DEXTROSE-NACL 20-5-0.45 MEQ/L-%-% IV SOLN
INTRAVENOUS | Status: AC
  Filled 2014-02-11: qty 1000

## 2014-02-11 MED ORDER — PIPERACILLIN-TAZOBACTAM 3.375 G IVPB
INTRAVENOUS | Status: AC
  Filled 2014-02-11: qty 50

## 2014-02-11 MED ORDER — PANTOPRAZOLE SODIUM 40 MG IV SOLR
40.0000 mg | Freq: Every day | INTRAVENOUS | Status: DC
  Administered 2014-02-11 – 2014-02-19 (×9): 40 mg via INTRAVENOUS
  Filled 2014-02-11 (×11): qty 40

## 2014-02-11 MED ORDER — FENTANYL CITRATE 0.05 MG/ML IJ SOLN
INTRAMUSCULAR | Status: AC
  Filled 2014-02-11: qty 5

## 2014-02-11 MED ORDER — MORPHINE SULFATE (PF) 1 MG/ML IV SOLN
INTRAVENOUS | Status: DC
  Administered 2014-02-11 (×2): via INTRAVENOUS
  Filled 2014-02-11: qty 25

## 2014-02-11 MED ORDER — HYDROMORPHONE HCL PF 2 MG/ML IJ SOLN
INTRAMUSCULAR | Status: AC
  Filled 2014-02-11: qty 1

## 2014-02-11 MED ORDER — NEOSTIGMINE METHYLSULFATE 10 MG/10ML IV SOLN
INTRAVENOUS | Status: DC | PRN
  Administered 2014-02-11: 4 mg via INTRAVENOUS

## 2014-02-11 MED ORDER — PHENYLEPHRINE HCL 10 MG/ML IJ SOLN
INTRAMUSCULAR | Status: DC | PRN
  Administered 2014-02-11 (×4): 80 ug via INTRAVENOUS
  Administered 2014-02-11: 120 ug via INTRAVENOUS
  Administered 2014-02-11: 80 ug via INTRAVENOUS

## 2014-02-11 MED ORDER — HYDROMORPHONE HCL PF 1 MG/ML IJ SOLN
INTRAMUSCULAR | Status: DC | PRN
  Administered 2014-02-11: 1 mg via INTRAVENOUS

## 2014-02-11 MED ORDER — DIPHENHYDRAMINE HCL 12.5 MG/5ML PO ELIX: 12.5000 mg | ORAL_SOLUTION | Freq: Four times a day (QID) | ORAL | Status: DC | PRN

## 2014-02-11 MED ORDER — PIPERACILLIN-TAZOBACTAM 3.375 G IVPB
3.3750 g | Freq: Three times a day (TID) | INTRAVENOUS | Status: DC
  Administered 2014-02-11 – 2014-02-18 (×20): 3.375 g via INTRAVENOUS
  Filled 2014-02-11 (×21): qty 50

## 2014-02-11 MED ORDER — ONDANSETRON HCL 4 MG PO TABS: 4.0000 mg | ORAL_TABLET | Freq: Four times a day (QID) | ORAL | Status: DC | PRN

## 2014-02-11 MED ORDER — IOHEXOL 300 MG/ML  SOLN
50.0000 mL | Freq: Once | INTRAMUSCULAR | Status: AC | PRN
  Administered 2014-02-11: 50 mL via ORAL

## 2014-02-11 MED ORDER — PROPOFOL 10 MG/ML IV BOLUS
INTRAVENOUS | Status: DC | PRN
  Administered 2014-02-11: 160 mg via INTRAVENOUS

## 2014-02-11 MED ORDER — VANCOMYCIN HCL 10 G IV SOLR
1250.0000 mg | Freq: Once | INTRAVENOUS | Status: AC
  Administered 2014-02-11: 1250 mg via INTRAVENOUS
  Filled 2014-02-11: qty 1250

## 2014-02-11 MED ORDER — FENTANYL CITRATE 0.05 MG/ML IJ SOLN
50.0000 ug | Freq: Once | INTRAMUSCULAR | Status: AC
  Administered 2014-02-11: 50 ug via INTRAVENOUS
  Filled 2014-02-11: qty 2

## 2014-02-11 MED ORDER — ROCURONIUM BROMIDE 100 MG/10ML IV SOLN
INTRAVENOUS | Status: DC | PRN
  Administered 2014-02-11: 30 mg via INTRAVENOUS
  Administered 2014-02-11: 10 mg via INTRAVENOUS
  Administered 2014-02-11: 20 mg via INTRAVENOUS
  Administered 2014-02-11: 10 mg via INTRAVENOUS

## 2014-02-11 MED ORDER — SODIUM CHLORIDE 0.9 % IV SOLN
INTRAVENOUS | Status: DC
  Administered 2014-02-11: 75 mL via INTRAVENOUS
  Administered 2014-02-12 – 2014-02-16 (×4): via INTRAVENOUS

## 2014-02-11 MED ORDER — INSULIN ASPART 100 UNIT/ML ~~LOC~~ SOLN: 0.0000 [IU] | Freq: Three times a day (TID) | SUBCUTANEOUS | Status: DC

## 2014-02-11 MED ORDER — ONDANSETRON HCL 4 MG/2ML IJ SOLN
4.0000 mg | Freq: Four times a day (QID) | INTRAMUSCULAR | Status: DC | PRN
  Administered 2014-02-18 – 2014-02-20 (×3): 4 mg via INTRAVENOUS
  Filled 2014-02-11 (×3): qty 2

## 2014-02-11 MED ORDER — SODIUM CHLORIDE 0.9 % IV SOLN
10.0000 mg | INTRAVENOUS | Status: DC | PRN
  Administered 2014-02-11: 10 ug/min via INTRAVENOUS

## 2014-02-11 MED ORDER — HEPARIN SODIUM (PORCINE) 5000 UNIT/ML IJ SOLN
5000.0000 [IU] | Freq: Three times a day (TID) | INTRAMUSCULAR | Status: DC
  Administered 2014-02-11 – 2014-02-24 (×39): 5000 [IU] via SUBCUTANEOUS
  Filled 2014-02-11 (×44): qty 1

## 2014-02-11 MED ORDER — MIDAZOLAM HCL 5 MG/5ML IJ SOLN
INTRAMUSCULAR | Status: DC | PRN
  Administered 2014-02-11: 2 mg via INTRAVENOUS

## 2014-02-11 MED ORDER — MORPHINE SULFATE (PF) 1 MG/ML IV SOLN
INTRAVENOUS | Status: AC
  Administered 2014-02-11: 19.5 mg
  Filled 2014-02-11: qty 25

## 2014-02-11 MED ORDER — HYDROMORPHONE HCL PF 1 MG/ML IJ SOLN: 0.2500 mg | INTRAMUSCULAR | Status: DC | PRN

## 2014-02-11 SURGICAL SUPPLY — 52 items
APPLICATOR COTTON TIP 6IN STRL (MISCELLANEOUS) ×3 IMPLANT
APPLIER CLIP ROT 10 11.4 M/L (STAPLE) ×3
APR CLP MED LRG 11.4X10 (STAPLE) ×1
BANDAGE GAUZE ELAST BULKY 4 IN (GAUZE/BANDAGES/DRESSINGS) ×2 IMPLANT
BLADE EXTENDED COATED 6.5IN (ELECTRODE) ×2 IMPLANT
BLADE HEX COATED 2.75 (ELECTRODE) ×6 IMPLANT
CANISTER SUCTION 2500CC (MISCELLANEOUS) IMPLANT
CLIP APPLIE ROT 10 11.4 M/L (STAPLE) IMPLANT
COVER MAYO STAND STRL (DRAPES) ×3 IMPLANT
DRAPE LAPAROSCOPIC ABDOMINAL (DRAPES) ×3 IMPLANT
DRAPE WARM FLUID 44X44 (DRAPE) ×3 IMPLANT
ELECT REM PT RETURN 9FT ADLT (ELECTROSURGICAL) ×3
ELECTRODE REM PT RTRN 9FT ADLT (ELECTROSURGICAL) ×1 IMPLANT
GLOVE BIOGEL PI IND STRL 7.0 (GLOVE) ×1 IMPLANT
GLOVE BIOGEL PI INDICATOR 7.0 (GLOVE) ×2
GLOVE SURG SIGNA 7.5 PF LTX (GLOVE) ×9 IMPLANT
GOWN STRL REUS W/TWL LRG LVL3 (GOWN DISPOSABLE) ×3 IMPLANT
GOWN STRL REUS W/TWL XL LVL3 (GOWN DISPOSABLE) ×6 IMPLANT
HEMOSTAT SNOW SURGICEL 2X4 (HEMOSTASIS) ×2 IMPLANT
HEMOSTAT SURGICEL 4X8 (HEMOSTASIS) ×2 IMPLANT
KIT BASIN OR (CUSTOM PROCEDURE TRAY) ×6 IMPLANT
LIGASURE IMPACT 36 18CM CVD LR (INSTRUMENTS) ×2 IMPLANT
MANIFOLD NEPTUNE II (INSTRUMENTS) ×2 IMPLANT
NS IRRIG 1000ML POUR BTL (IV SOLUTION) ×17 IMPLANT
PACK GENERAL/GYN (CUSTOM PROCEDURE TRAY) ×5 IMPLANT
PAD ABD 8X10 STRL (GAUZE/BANDAGES/DRESSINGS) ×2 IMPLANT
RELOAD PROXIMATE 75MM BLUE (ENDOMECHANICALS) ×3 IMPLANT
RELOAD STAPLE 75 3.8 BLU REG (ENDOMECHANICALS) IMPLANT
SPONGE GAUZE 4X4 12PLY (GAUZE/BANDAGES/DRESSINGS) ×3 IMPLANT
SPONGE LAP 18X18 X RAY DECT (DISPOSABLE) ×6 IMPLANT
STAPLER PROXIMATE 75MM BLUE (STAPLE) ×3 IMPLANT
STAPLER VISISTAT 35W (STAPLE) ×3 IMPLANT
SUCTION POOLE TIP (SUCTIONS) ×2 IMPLANT
SUT NOVA 1 T20/GS 25DT (SUTURE) ×4 IMPLANT
SUT PDS AB 1 CTX 36 (SUTURE) ×6 IMPLANT
SUT PROLENE 2 0 CT2 30 (SUTURE) ×2 IMPLANT
SUT SILK 2 0 (SUTURE) ×6
SUT SILK 2 0 SH CR/8 (SUTURE) ×3 IMPLANT
SUT SILK 2 0SH CR/8 30 (SUTURE) ×2 IMPLANT
SUT SILK 2-0 18XBRD TIE 12 (SUTURE) IMPLANT
SUT SILK 3 0 (SUTURE)
SUT SILK 3 0 SH CR/8 (SUTURE) ×3 IMPLANT
SUT SILK 3-0 18XBRD TIE 12 (SUTURE) IMPLANT
SUT VIC AB 2-0 SH 18 (SUTURE) ×3 IMPLANT
SUT VIC AB 3-0 SH 18 (SUTURE) ×2 IMPLANT
SUT VICRYL 2 0 18  UND BR (SUTURE) ×2
SUT VICRYL 2 0 18 UND BR (SUTURE) IMPLANT
TAPE CLOTH SURG 4X10 WHT LF (GAUZE/BANDAGES/DRESSINGS) ×2 IMPLANT
TOWEL OR 17X26 10 PK STRL BLUE (TOWEL DISPOSABLE) ×8 IMPLANT
TRAY FOLEY CATH 14FRSI W/METER (CATHETERS) ×2 IMPLANT
WATER STERILE IRR 1500ML POUR (IV SOLUTION) IMPLANT
YANKAUER SUCT BULB TIP NO VENT (SUCTIONS) ×3 IMPLANT

## 2014-02-11 NOTE — Progress Notes (Signed)
Patient ID: Martha Evans, female   DOB: 06/17/1946, 68 y.o.   MRN: [id number] Day of Surgery  Subjective: Feels weak but denies worsening abd pain, SOB, chest pain  Objective: Vital signs in last 24 hours: Temp:  [98.5 F (36.9 C)-99.4 F (37.4 C)] 98.7 F (37.1 C) (05/16 1600) Pulse Rate:  [65-108] 66 (05/16 1915) Resp:  [11-20] 11 (05/16 1915) BP: (73-130)/(34-75) 76/34 mmHg (05/16 1915) SpO2:  [88 %-100 %] 96 % (05/16 1915) FiO2 (%):  [55 %] 55 % (05/16 1845)    Intake/Output from previous day:   Intake/Output this shift:    General appearance: alert, cooperative and no distress Resp: clear to auscultation bilaterally and no increased WOB GI: Non distended, soft with mild appropriate tenderness Incision/Wound: Dressing clean and dry  Lab Results:   Recent Labs  02/11/14 0022 02/11/14 0030 02/11/14 1459  WBC 9.2  --   --   HGB 10.2* 10.2* 8.5*  HCT 31.0* 30.0* 25.7*  PLT 255  --   --    BMET  Recent Labs  02/11/14 0030  NA 138  K 3.7  CL 100  GLUCOSE 109*  BUN 23  CREATININE 0.80     Studies/Results: Ct Abdomen Pelvis W Contrast  02/11/2014   CLINICAL DATA:  Constipation, left lower quadrant pain, right breast cancer.  EXAM: CT ABDOMEN AND PELVIS WITH CONTRAST  TECHNIQUE: Multidetector CT imaging of the abdomen and pelvis was performed using the standard protocol following bolus administration of intravenous contrast.  CONTRAST:  32mL OMNIPAQUE IOHEXOL 300 MG/ML SOLN, 146mL OMNIPAQUE IOHEXOL 300 MG/ML SOLN  COMPARISON:  Included view of the lung bases demonstrate enhancing atelectasis lingula. The heart appears at least mild to moderately enlarged, incompletely characterized. The visualized pericardium is unremarkable. . Included view of the chest demonstrates at least 6.8 x 4.5 cm right breast cystic mass, with peripheral surgical clips, 5 Hounsfield units.  Small to moderate amount of pneumoperitoneum. Sigmoid diverticulitis with perforation, 17 x 23 mm  contiguous intramural abscess. In addition, inflammatory changes about the descending colon with a 16 x 34 x51 mm rim enhancing fluid collection within the left paracolic gutter, consistent with abscess. Moderate amount of retained large bowel stool.  The liver, spleen, pancreas and adrenal glands are unremarkable. Cholelithiasis without superimposed inflammatory changes to suggest acute cholecystitis.  The stomach, small bowel are normal in course and caliber without inflammatory changes. However, slight thickening of the gastric antrum, which could reflect gastritis. Enteric contrast has not yet reached the distal small bowel. The appendix is not discretely identified, however there are no inflammatory changes in the right lower quadrant.  Kidneys are orthotopic, demonstrating symmetric enhancement without nephrolithiasis, hydronephrosis or solid renal masses. 26 mm cyst arises from the lower pole of right kidney. The unopacified ureters are normal in course and caliber. Delayed imaging through the kidneys demonstrates symmetric prompt excretion to the proximal urinary collecting system. Urinary bladder is partially distended and unremarkable.  Aortoiliac vessels are normal in course and caliber. No lymphadenopathy by CT size criteria. Status post hysterectomy. The soft tissues and included osseous structures are nonsuspicious. 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 Severe L4-5 degenerative disc disease with severe lower lumbar facet arthropathy resulting in severe L3-4 and L4-5 neural foraminal narrowing.  FINDINGS: Perforated sigmoid diverticulitis, with small to moderate amount of pneumoperitoneum. 17 x 23 mm intramural abscess of the segment colon, in addition to a 16 x 34 x 51 mm abscess within the left  pericolic gutter. Moderate amount of retained large bowel stool without bowel obstruction.  Partially imaged 6.8 x 4.5 cm right cystic breast mass, this could reflect seroma,  recommend correlation with surgical history.  Findings discussed with and reconfirmed by Dr.Opitz on5/16/2015at3:16 AM.   Electronically Signed   By: Elon Alas   On: 02/11/2014 03:16   Dg Abd Acute W/chest  02/11/2014   CLINICAL DATA:  Left abdominal pain and constipation. History of breast cancer.  EXAM: ACUTE ABDOMEN SERIES (ABDOMEN 2 VIEW & CHEST 1 VIEW)  COMPARISON:  01/02/2014.  FINDINGS: Borderline enlarged cardiac silhouette with an interval decrease in size. Stable left subclavian porta catheter. Clear lungs. Stable right lower neck surgical clips.  Normal bowel gas pattern without free peritoneal air. Possible small lower pole right renal calculus. Right breast surgical clips. Thoracolumbar spine degenerative changes and mild scoliosis.  IMPRESSION: 1. No acute abnormality. 2. Borderline cardiomegaly with improvement. 3. Possible small lower pole right renal calculus.   Electronically Signed   By: Enrique Sack M.D.   On: 02/11/2014 01:31    Anti-infectives: Anti-infectives   Start     Dose/Rate Route Frequency Ordered Stop   02/11/14 1800  piperacillin-tazobactam (ZOSYN) IVPB 3.375 g     3.375 g 12.5 mL/hr over 240 Minutes Intravenous Every 8 hours 02/11/14 1150     02/11/14 0330  piperacillin-tazobactam (ZOSYN) IVPB 3.375 g  Status:  Discontinued     3.375 g 12.5 mL/hr over 240 Minutes Intravenous  Once 02/11/14 0318 02/11/14 0320   02/11/14 0330  piperacillin-tazobactam (ZOSYN) IVPB 3.375 g     3.375 g 100 mL/hr over 30 Minutes Intravenous  Once 02/11/14 0321 02/11/14 0410      Assessment/Plan: s/p Procedure(s): EXPLORATORY LAPAROTOMY, COLON RESECTION, COLOSTOMY Persistent hypotension post fluid boluses with temporary improvement Doubt bleeding but recheck H/H ? Sepsis,  ? Need for central line or pressors? Discussed with CCM, possibility of adrenal insufficiency raised as pt was on steroids with chemoRx CCM to evaluate pt      LOS: 1 day    Edward Jolly 02/11/2014

## 2014-02-11 NOTE — Transfer of Care (Signed)
Immediate Anesthesia Transfer of Care Note  Patient: Martha Evans  Procedure(s) Performed: Procedure(s): EXPLORATORY LAPAROTOMY, COLON RESECTION, COLOSTOMY (N/A)  Patient Location: PACU  Anesthesia Type:General  Level of Consciousness: sedated  Airway & Oxygen Therapy: Patient Spontanous Breathing and Patient connected to face mask oxygen  Post-op Assessment: Report given to PACU RN and Post -op Vital signs reviewed and stable  Post vital signs: Reviewed and stable  Complications: No apparent anesthesia complications

## 2014-02-11 NOTE — ED Notes (Signed)
Pt has Right Arm restricted extremity. Pink band placed at this time.

## 2014-02-11 NOTE — Anesthesia Preprocedure Evaluation (Addendum)
Anesthesia Evaluation  Patient identified by MRN, date of birth, ID band Patient awake    Reviewed: Allergy & Precautions, H&P , NPO status , Patient's Chart, lab work & pertinent test results  History of Anesthesia Complications (+) DIFFICULT AIRWAY  Airway Mallampati: III TM Distance: >3 FB Neck ROM: full    Dental  (+) Caps, Dental Advisory Given All upper front are capped:   Pulmonary neg pulmonary ROS,  breath sounds clear to auscultation  Pulmonary exam normal       Cardiovascular Exercise Tolerance: Good hypertension, Pt. on medications negative cardio ROS  Rhythm:regular Rate:Normal  Irregular heart beat   Neuro/Psych negative neurological ROS  negative psych ROS   GI/Hepatic negative GI ROS, Neg liver ROS, GERD-  Medicated and Controlled,  Endo/Other  negative endocrine ROS  Renal/GU negative Renal ROS  negative genitourinary   Musculoskeletal   Abdominal   Peds  Hematology negative hematology ROS (+) anemia , hgb 10.2   Anesthesia Other Findings   Reproductive/Obstetrics negative OB ROS                          Anesthesia Physical Anesthesia Plan  ASA: III and emergent  Anesthesia Plan: General   Post-op Pain Management:    Induction: Intravenous, Cricoid pressure planned and Rapid sequence  Airway Management Planned: Oral ETT  Additional Equipment:   Intra-op Plan:   Post-operative Plan: Extubation in OR  Informed Consent: I have reviewed the patients History and Physical, chart, labs and discussed the procedure including the risks, benefits and alternatives for the proposed anesthesia with the patient or authorized representative who has indicated his/her understanding and acceptance.   Dental Advisory Given  Plan Discussed with: CRNA and Surgeon  Anesthesia Plan Comments:         Anesthesia Quick Evaluation

## 2014-02-11 NOTE — Progress Notes (Signed)
eLink Physician-Brief Progress Note Patient Name: Martha Evans DOB: [birth year] MRN: [id number]  Date of Service  02/11/2014   HPI/Events of Note   H/o breast CA On Dexamethazone preadmission Hypotensive post operatively   eICU Interventions   Stress dose steroids Trend lactate / troponin Micafungin added to Zosyn Bedside MD to see    Intervention Category Major Interventions: Hypotension - evaluation and management;Sepsis - evaluation and management  Codie Hainer 02/11/2014, 7:40 PM

## 2014-02-11 NOTE — Op Note (Signed)
NAMESUKARI, GRIST                 ACCOUNT NO.:  [id number]  MEDICAL RECORD NO.:  [id number]  LOCATION:  4854                         FACILITY:  [facility]  PHYSICIAN:  Fenton Malling. Lucia Gaskins, M.D.  DATE OF BIRTH:  03-28-1946  DATE OF PROCEDURE:  02/11/2014                               OPERATIVE REPORT   PREOPERATIVE DIAGNOSIS:  Perforated colon.  Pnuemoperitoneum  POSTOPERATIVE DIAGNOSES:  Perforated proximal left colon with abscess along he left colonic gutter.  OPERATION PERFORMED:  Exploratory laparotomy with removal of left colon and splenic flexure (mobilization of the splenic flexure), Hartmann pouch, and left transverse colon end colostomy.  SURGEON:  Fenton Malling. Lucia Gaskins, M.D.  FIRST ASSISTANT:  Marland Kitchen T. Hoxworth, M.D.  ANESTHESIA:  General endotracheal supervised by Dr. Rod Mae.  ESTIMATED BLOOD LOSS:  250 mL.  DRAINS:  Left in were none.  INDICATION FOR PROCEDURE:  Ms. Murin is a 68 year old white female, who sees Dr. Juanetta Beets for primary care.  She is being actively treated at is time for triple negative right breast cancer by Dr. Armandina Gemma, Dr. Marcy Panning, and Dr. Thea Silversmith.  She presented with a 4-day istory of increasing abdominal pain.  A CT scan suggesting perforated bowel from possible diverticular disease.  I discussed with the patient and her husband about proceeding with surgery at this time.  The indications and potential risks of surgery ere explained to the patient.  Potential risks include, but are not limited to, infection which I think she already has, bleeding, probable colostomy, and nerve injury.  OPERATIVE NOTE:  The patient was taken to room #1 at [facility] where she underwent a general endotracheal anesthetic.  She was on Zosyn as antibiotic.  Dr. Rod Mae was her supervising anesthesiologist.  A time-out was held and surgical checklist run.    She had a Foley atheter in place.  Her abdomen was prepped with ChloraPrep and terilely  draped.  I went through a midline incision.  She has had a prior abdominoplasty, prior appendectomy, and prior hysterectomy.  The abdominoplasty made her abdomen tight limiting some exposure of her abdominal compartment.  She had adhesions in her pelvis and her right lower quadrant around her cecum from her prior hysterectomy and appendectomy.  I freed off the omentum which was on the undersurface of her lower part of her incision.  She had small bowel stuck in the pelvis in the area of her hysterectomy.  I was able to mobilize the small bowel out of the pelvis and the cecum out of the pelvis also.  She had a gnarled sigmoid colon, which was thickened, but not obviously perforated nor was there any evidence of an abscess.  I do not think this is her primary problem.  However, exploring her left colonic gutter just below the splenic flexure, she had foul-smelling abscess with evidence of perforation along her proximal left colon.  Whether this is some type of bowel injury secondary to her chemotherapy or diverticular perforation is unclear at the time of surgery.  Because of her being on chemotherapy with immunosuppression, I thought she would be best served by having this segment of colon resected.  I divided her left colon right at the proximal sigmoid colon with a 75-mm GIA stapler.  I placed 2 Prolene sutures on this proximal sigmoid colon for future identification.   Of note, in coming back for surgery to reverse her colostomy, most likely she will have to have most of her sigmoid colon resected because it is thickened and gnarled.   Since her primary injury today was her proximal left colon, I left the remainder of the sigmoid colon alone.  I was able to mobilize the splenic flexure and transverse colon and elevated the left colon, splenic flexure, and left transverse colon.  I divided the proximal left colon with a 75-mm GIA stapler, and put a suture on the distal end of the left colon to orient the  specimen and this was sent to Pathology.  I did have a small tear in the inferior pole of the spleen and took about 15 minutes to control this with Snow, Vicryl, Surgicel, and pressure, but then after this was in place there was no active bleeding.  This area was watched for 10 minutes with no further bleeding.  I irrigated the abdomen with 5 L of saline.  Her right and left lobes of liver were unremarkable.  The stomach that I could feel was unremarkable.  I ran the small bowel from the ligament Treitz to the terminal ileum, she had some adhesions down to her pelvis, but not much in the way of adhesions proximally.  Her small bowel and colon was somewhat distended, probably due to an ileus, which limited palpating the proximal colon very well.  Because of her immunosuppression and lack of bowel prep, I thought she is not a good candidate for a primary anastomosis.  I brought out a colostomy in her left upper quadrant with a left transverse colon.  I brought this to cut out about 2.5 cm circle of skin and subcutaneous fat.  I cut down to the fascia and went just lateral to the rectus muscle to bring up the colostomy with intraperitoneal stitches of 2-0 Vicryl suture.  I then returned the omentum to the midline underneath the midline incision.  I then closed the incision with 2 running #1 PDS sutures with about every 4th or 5th suture I placed an interrupted #1 Novafil suture.  I then irrigated the wound, placed the 4-inch Kerlix gauze in the wound.    I then turned my attention to colostomy, I resected the staple line from the colostomy, and then matured the colostomy using interrupted 3-0 Vicryl sutures and trying to evert the mucosa of the left transverse colon.  The patient had tolerated the procedure well.  Sponge and needle count were correct at the end of the case.  She has been transferred to recovery room with plans to put her in the step-down ICU at least for a day or 2, where she  recovers.  Of note, in reversing her colostomy, she will most likely need her most of her sigmoid colon removed.  She did appear to have adequate length of her transverse colon to get this down to the pelvis.  I did have some bleeding at the inferior pole of the spleen.  This seem controlled at the end of the operation.  There was no evidence of any malignancy.  The patient was transferred to recovery room in good condition.   Fenton Malling. Lucia Gaskins, M.D., FACS    DHN/MEDQ  D:  02/11/2014  T:  02/11/2014  Job:  [id number]  cc:   [name], MD Fax: [phone]  Marcy Panning, M.D.  Thea Silversmith, M.D. Fax: [phone]  Bartholome Bill, MD Fax: [phone]  Earnstine Regal, MD [phone] [address] [place] [address]

## 2014-02-11 NOTE — Anesthesia Postprocedure Evaluation (Signed)
  Anesthesia Post-op Note  Patient: Martha Evans  Procedure(s) Performed: Procedure(s) (LRB): EXPLORATORY LAPAROTOMY, COLON RESECTION, COLOSTOMY (N/A)  Patient Location: PACU  Anesthesia Type: General  Level of Consciousness: awake and alert   Airway and Oxygen Therapy: Patient Spontanous Breathing  Post-op Pain: mild  Post-op Assessment: Post-op Vital signs reviewed, Patient's Cardiovascular Status Stable, Respiratory Function Stable, Patent Airway and No signs of Nausea or vomiting  Last Vitals:  Filed Vitals:   02/11/14 1100  BP: 113/57  Pulse: 89  Temp:   Resp: 17    Post-op Vital Signs: stable   Complications: No apparent anesthesia complications

## 2014-02-11 NOTE — ED Provider Notes (Signed)
CSN: [id number]     Arrival date & time 02/10/14  2308 History   First MD Initiated Contact with Patient 02/10/14 2359     Chief Complaint  Patient presents with  . Constipation  . Arm Pain     (Consider location/radiation/quality/duration/timing/severity/associated sxs/prior Treatment) Patient is a 68 y.o. female presenting with constipation and arm pain. The history is provided by the patient.  Constipation Severity:  Mild Time since last bowel movement:  4 days Timing:  Constant Progression:  Worsening Chronicity:  Recurrent Context: medication   Context: not dehydration   Stool description:  Hard, pellet like and small Unusual stool frequency:  Daily Relieved by:  Nothing Worsened by:  Prescription drugs and suppositories Ineffective treatments:  Laxatives, Miralax and stool softeners Associated symptoms: abdominal pain and fever   Associated symptoms: no diarrhea, no dysuria, no flatus, no nausea and no urinary retention   Abdominal pain:    Location:  LUQ and LLQ   Quality:  Cramping and pressure   Severity:  Moderate   Onset quality:  Gradual   Duration:  2 days   Timing:  Constant   Progression:  Worsening   Chronicity:  Recurrent Risk factors comment:  Chemotherapy Arm Pain Associated symptoms include abdominal pain and a fever. Pertinent negatives include no chills or nausea.    Past Medical History  Diagnosis Date  . Hypertension   . Allergy   . Wears hearing aid     right  . GERD (gastroesophageal reflux disease)   . Complication of anesthesia     was hard to intubate with thyroid surgery-99  . Difficult intubation 1999    when had thyroid lobectomy  . Dysrhythmia     irregular reuglar heart beat   . Cancer     breast cancer    Past Surgical History  Procedure Laterality Date  . Thyroid lobectomy  1999    right  . Rotator cuff repair  2006    right shoulder  . Breast reduction surgery Bilateral     1980's  . Abdominal hysterectomy  1999  .  Tonsillectomy      as child  . Appendectomy      as child  . Colonoscopy    . Partial mastectomy with needle localization and axillary sentinel lymph node bx Right 12/12/2013    Procedure: PARTIAL MASTECTOMY WITH NEEDLE LOCALIZATION AND AXILLARY SENTINEL LYMPH NODE BX;  Surgeon: Earnstine Regal, MD;  Location: [facility];  Service: General;  Laterality: Right;  . Portacath placement Left 01/02/2014    Procedure: INSERTION PORT-A-CATH;  Surgeon: Earnstine Regal, MD;  Location: [facility];  Service: General;  Laterality: Left;  . Laparotomy N/A 02/11/2014    Procedure: EXPLORATORY LAPAROTOMY, COLON RESECTION, COLOSTOMY;  Surgeon: Shann Medal, MD;  Location: [facility];  Service: General;  Laterality: N/A;   Family History  Problem Relation Age of Onset  . Cancer Father     bladder & pancreatic   History  Substance Use Topics  . Smoking status: Never Smoker   . Smokeless tobacco: Never Used  . Alcohol Use: Yes     Comment: seldom   OB History   Grav Para Term Preterm Abortions TAB SAB Ect Mult Living                 Review of Systems  Constitutional: Positive for fever. Negative for chills.  Gastrointestinal: Positive for abdominal pain and constipation. Negative for nausea, diarrhea and flatus.  Genitourinary: Negative for dysuria.      Allergies  Codeine sulfate and Tramadol  Home Medications   Prior to Admission medications   Medication Sig Start Date End Date Taking? Authorizing Provider  calcium carbonate (TUMS - DOSED IN MG ELEMENTAL CALCIUM) 500 MG chewable tablet Chew 1 tablet by mouth daily. As needed   Yes Historical Provider, MD  calcium-vitamin D (OSCAL WITH D) 500-200 MG-UNIT per tablet Take 1 tablet by mouth daily.    Yes Historical Provider, MD  dexamethasone (DECADRON) 4 MG tablet Take 2 tablets (8 mg total) by mouth 2 (two) times daily. Start the day before Taxotere. Then again the day after chemo for 3 days. 12/21/13  Yes [name], MD   lidocaine-prilocaine (EMLA) cream Apply topically as needed. 12/21/13  Yes [name], MD  loratadine (CLARITIN) 10 MG tablet Take 10 mg by mouth daily. lst dose 12/11/13 12/21/13  Yes Historical Provider, MD  LORazepam (ATIVAN) 0.5 MG tablet Take 1 tablet (0.5 mg total) by mouth every 6 (six) hours as needed (Nausea or vomiting). 12/21/13  Yes [name], MD  omeprazole (PRILOSEC) 20 MG capsule Take 20 mg by mouth daily.   Yes Historical Provider, MD  ondansetron (ZOFRAN) 8 MG tablet Take 1 tablet (8 mg total) by mouth 2 (two) times daily. Start the day after chemo for 3 days. Then take as needed for nausea or vomiting. 12/21/13  Yes [name], MD  polyethylene glycol (MIRALAX / GLYCOLAX) packet Take 17 g by mouth daily as needed for mild constipation.   Yes Historical Provider, MD  prochlorperazine (COMPAZINE) 10 MG tablet Take 1 tablet (10 mg total) by mouth every 6 (six) hours as needed (Nausea or vomiting). 12/21/13  Yes [name], MD  sennosides-docusate sodium (SENOKOT-S) 8.6-50 MG tablet Take 1 tablet by mouth daily.   Yes Historical Provider, MD  telmisartan (MICARDIS) 40 MG tablet Take 40 mg by mouth daily.   Yes Historical Provider, MD   BP 116/73  Pulse 73  Temp(Src) 98.1 F (36.7 C) (Oral)  Resp 20  Ht 5' 6"  (1.676 m)  Wt 193 lb 9 oz (87.8 kg)  BMI 31.26 kg/m2  SpO2 96% Physical Exam  Nursing note and vitals reviewed. Constitutional: She is oriented to person, place, and time. She appears well-developed and well-nourished.  HENT:  Head: Normocephalic.  Eyes: Pupils are equal, round, and reactive to light.  Neck: Normal range of motion.  Cardiovascular: Normal rate.   Pulmonary/Chest: Effort normal and breath sounds normal.  Abdominal: She exhibits no distension. Bowel sounds are decreased. There is tenderness in the left upper quadrant and left lower quadrant. There is no guarding.  Musculoskeletal: Normal range of motion.  Neurological: She is alert and  oriented to person, place, and time.  Skin: Skin is warm and dry. There is pallor.    ED Course  Procedures (including critical care time) Labs Review Labs Reviewed  CBC WITH DIFFERENTIAL - Abnormal; Notable for the following:    RBC 3.48 (*)    Hemoglobin 10.2 (*)    HCT 31.0 (*)    Neutrophils Relative % 88 (*)    Lymphocytes Relative 7 (*)    Monocytes Relative 2 (*)    Basophils Relative 2 (*)    Neutro Abs 8.1 (*)    Lymphs Abs 0.6 (*)    Basophils Absolute 0.2 (*)    All other components within normal limits  HEMOGLOBIN AND HEMATOCRIT, BLOOD - Abnormal;  Notable for the following:    Hemoglobin 8.5 (*)    HCT 25.7 (*)    All other components within normal limits  BASIC METABOLIC PANEL - Abnormal; Notable for the following:    Sodium 130 (*)    Potassium 3.6 (*)    Glucose, Bld 390 (*)    Calcium 7.2 (*)    All other components within normal limits  CBC - Abnormal; Notable for the following:    RBC 2.65 (*)    Hemoglobin 7.8 (*)    HCT 24.2 (*)    All other components within normal limits  CBC - Abnormal; Notable for the following:    WBC 3.0 (*)    RBC 2.51 (*)    Hemoglobin 7.6 (*)    HCT 23.4 (*)    All other components within normal limits  GLUCOSE, CAPILLARY - Abnormal; Notable for the following:    Glucose-Capillary 172 (*)    All other components within normal limits  CALCIUM, IONIZED - Abnormal; Notable for the following:    Calcium, Ion 1.09 (*)    All other components within normal limits  COMPREHENSIVE METABOLIC PANEL - Abnormal; Notable for the following:    Sodium 136 (*)    Glucose, Bld 165 (*)    Calcium 7.6 (*)    Total Protein 5.1 (*)    Albumin 2.0 (*)    Total Bilirubin 1.7 (*)    All other components within normal limits  CBC - Abnormal; Notable for the following:    WBC 2.9 (*)    RBC 2.82 (*)    Hemoglobin 8.4 (*)    HCT 25.5 (*)    All other components within normal limits  GLUCOSE, CAPILLARY - Abnormal; Notable for the  following:    Glucose-Capillary 143 (*)    All other components within normal limits  GLUCOSE, CAPILLARY - Abnormal; Notable for the following:    Glucose-Capillary 170 (*)    All other components within normal limits  GLUCOSE, CAPILLARY - Abnormal; Notable for the following:    Glucose-Capillary 133 (*)    All other components within normal limits  CBC WITH DIFFERENTIAL - Abnormal; Notable for the following:    RBC 2.56 (*)    Hemoglobin 7.7 (*)    HCT 23.1 (*)    Lymphs Abs 0.6 (*)    Monocytes Relative 15 (*)    All other components within normal limits  BASIC METABOLIC PANEL - Abnormal; Notable for the following:    Glucose, Bld 114 (*)    Calcium 8.1 (*)    All other components within normal limits  PREALBUMIN - Abnormal; Notable for the following:    Prealbumin 6.9 (*)    All other components within normal limits  GLUCOSE, CAPILLARY - Abnormal; Notable for the following:    Glucose-Capillary 118 (*)    All other components within normal limits  GLUCOSE, CAPILLARY - Abnormal; Notable for the following:    Glucose-Capillary 104 (*)    All other components within normal limits  GLUCOSE, CAPILLARY - Abnormal; Notable for the following:    Glucose-Capillary 130 (*)    All other components within normal limits  GLUCOSE, CAPILLARY - Abnormal; Notable for the following:    Glucose-Capillary 104 (*)    All other components within normal limits  GLUCOSE, CAPILLARY - Abnormal; Notable for the following:    Glucose-Capillary 113 (*)    All other components within normal limits  GLUCOSE, CAPILLARY - Abnormal; Notable for the  following:    Glucose-Capillary 102 (*)    All other components within normal limits  GLUCOSE, CAPILLARY - Abnormal; Notable for the following:    Glucose-Capillary 102 (*)    All other components within normal limits  BASIC METABOLIC PANEL - Abnormal; Notable for the following:    Potassium 3.3 (*)    Glucose, Bld 109 (*)    GFR calc non Af Amer 88 (*)     All other components within normal limits  CBC - Abnormal; Notable for the following:    RBC 2.46 (*)    Hemoglobin 7.2 (*)    HCT 22.2 (*)    All other components within normal limits  GLUCOSE, CAPILLARY - Abnormal; Notable for the following:    Glucose-Capillary 118 (*)    All other components within normal limits  GLUCOSE, CAPILLARY - Abnormal; Notable for the following:    Glucose-Capillary 101 (*)    All other components within normal limits  GLUCOSE, CAPILLARY - Abnormal; Notable for the following:    Glucose-Capillary 110 (*)    All other components within normal limits  GLUCOSE, CAPILLARY - Abnormal; Notable for the following:    Glucose-Capillary 101 (*)    All other components within normal limits  CBC - Abnormal; Notable for the following:    WBC 11.6 (*)    RBC 2.99 (*)    Hemoglobin 8.6 (*)    HCT 27.0 (*)    All other components within normal limits  COMPREHENSIVE METABOLIC PANEL - Abnormal; Notable for the following:    Potassium 3.2 (*)    Total Protein 5.5 (*)    Albumin 2.2 (*)    GFR calc non Af Amer 54 (*)    GFR calc Af Amer 63 (*)    All other components within normal limits  GLUCOSE, CAPILLARY - Abnormal; Notable for the following:    Glucose-Capillary 103 (*)    All other components within normal limits  GLUCOSE, CAPILLARY - Abnormal; Notable for the following:    Glucose-Capillary 128 (*)    All other components within normal limits  GLUCOSE, CAPILLARY - Abnormal; Notable for the following:    Glucose-Capillary 108 (*)    All other components within normal limits  I-STAT CHEM 8, ED - Abnormal; Notable for the following:    Glucose, Bld 109 (*)    Calcium, Ion 1.12 (*)    Hemoglobin 10.2 (*)    HCT 30.0 (*)    All other components within normal limits  ANAEROBIC CULTURE  CULTURE, ROUTINE-ABSCESS  CULTURE, BLOOD (ROUTINE X 2)  CULTURE, BLOOD (ROUTINE X 2)  URINALYSIS, ROUTINE W REFLEX MICROSCOPIC  LACTIC ACID, PLASMA  LACTIC ACID, PLASMA   LACTIC ACID, PLASMA  TROPONIN I  TROPONIN I  TROPONIN I  COMPREHENSIVE METABOLIC PANEL  MAGNESIUM  PHOSPHORUS  LACTIC ACID, PLASMA  MAGNESIUM  PHOSPHORUS  TROPONIN I  GLUCOSE, CAPILLARY  GLUCOSE, CAPILLARY  MAGNESIUM  PHOSPHORUS  GLUCOSE, CAPILLARY  VANCOMYCIN, TROUGH  GLUCOSE, CAPILLARY  GLUCOSE, CAPILLARY  GLUCOSE, CAPILLARY  GLUCOSE, CAPILLARY  GLUCOSE, CAPILLARY  BASIC METABOLIC PANEL  SURGICAL PATHOLOGY    Imaging Review No results found.   EKG Interpretation None      MDM   Final diagnoses:  Pneumoperitoneum  Abdominal abscess        Garald Balding, NP 02/15/14 1956

## 2014-02-11 NOTE — Anesthesia Procedure Notes (Signed)
Procedure Name: Intubation Date/Time: 02/11/2014 7:31 AM Performed by: Lind Covert Pre-anesthesia Checklist: Patient identified, Emergency Drugs available, Suction available, Patient being monitored and Timeout performed Patient Re-evaluated:Patient Re-evaluated prior to inductionOxygen Delivery Method: Circle system utilized Preoxygenation: Pre-oxygenation with 100% oxygen Intubation Type: IV induction, Rapid sequence and Cricoid Pressure applied Laryngoscope Size: Mac and 3 Grade View: Grade II Tube size: 7.0 mm Number of attempts: 1 Airway Equipment and Method: Video-laryngoscopy and Stylet Placement Confirmation: ETT inserted through vocal cords under direct vision,  positive ETCO2 and breath sounds checked- equal and bilateral Secured at: 21 cm Tube secured with: Tape Dental Injury: Teeth and Oropharynx as per pre-operative assessment  Difficulty Due To: Difficulty was anticipated, Difficult Airway- due to anterior larynx and Difficult Airway- due to limited oral opening Future Recommendations: Recommend- induction with short-acting agent, and alternative techniques readily available

## 2014-02-11 NOTE — Consult Note (Addendum)
PULMONARY  / CRITICAL CARE MEDICINE  Name: Martha Evans MRN: [id number] DOB: 1945/10/01 PCP [name], MD   ADMISSION DATE:  02/10/2014 LOS 1 days  CONSULTATION DATE:  02/11/14  REFERRING MD :  [facility] PRIMARY SERVICE: [facility]  CHIEF COMPLAINT:  Hypotension  BRIEF PATIENT DESCRIPTION/HPI: 68 y/o woman with T1Nx R breast CA s/p chemo and R partial mastectomy (3/15) admitted with pneumoperitoneum due to perforated colonic divertculi with surrounding abscess. Underwent emergent laparotomy and colon resection with colostomy placement on 02/11/14. Post-op with persistent hypotension with SBP 70s. CCM called to consult.   On dexamethasone preadmission so she has been started on stress-dose steroids. Micafungin added to zosyn.   Labs back this evening: lactate 0.9. Na 128. Sodium 6.4. Mag 1.6 (likely inaccurate). Neo ordered but not started. Has gotten > 2L IVF. SBP 110 now. Feels better. Mild ab pain. Otherwise no complaints.    LINES / TUBES: Port-a-cath:  CULTURES: Wound culture 5/16 >>> pending Blood cx x 2 5/16 >>> pending UA 5/16 >>> negative  ANTIBIOTICS: Zosyn 5/16 Micafungin 5/16 Vanco 5/16  SIGNIFICANT EVENTS / STUDIES:  Laparotomy with colon resection 5/16   PAST MEDICAL HISTORY :  Past Medical History  Diagnosis Date  . Hypertension   . Allergy   . Wears hearing aid     right  . GERD (gastroesophageal reflux disease)   . Complication of anesthesia     was hard to intubate with thyroid surgery-99  . Difficult intubation 1999    when had thyroid lobectomy  . Dysrhythmia     irregular reuglar heart beat   . Cancer     breast cancer      Family History  Problem Relation Age of Onset  . Cancer Father     bladder & pancreatic     History   Social History  . Marital Status: Married    Spouse Name: N/A    Number of Children: N/A  . Years of Education: N/A   Occupational History  . Not on file.   Social History Main Topics  . Smoking  status: Never Smoker   . Smokeless tobacco: Never Used  . Alcohol Use: Yes     Comment: seldom  . Drug Use: No  . Sexual Activity: Yes   Other Topics Concern  . Not on file   Social History Narrative  . No narrative on file     Allergies  Allergen Reactions  . Codeine Sulfate Nausea And Vomiting  . Tramadol Nausea Only    Nausea     Family History  Problem Relation Age of Onset  . Cancer Father     bladder & pancreatic     (Not in an outpatient encounter)   SUBJECTIVE:   VITAL SIGNS: Filed Vitals:   02/11/14 1900 02/11/14 1915 02/11/14 2000 02/11/14 2115  BP: 81/42 76/34    Pulse: 70 66    Temp:   98.1 F (36.7 C)   TempSrc:   Oral   Resp: 14 11    Height:    5\' 6"  (1.676 m)  Weight:    83.462 kg (184 lb)  SpO2: 96% 96%        HEMODYNAMICS:   VENTILATOR SETTINGS: Vent Mode:  [-] PRVC FiO2 (%):  [30 %] 30 % Set Rate:  [28 bmp] 28 bmp Vt Set:  [420 mL] 420 mL PEEP:  [5 cmH20] 5 cmH20 Plateau Pressure:  [4 JJO84-16 cmH20] 20 cmH20 INTAKE / OUTPUT: I/O last 3  completed shifts: In: 6125 [I.V.:4100; IV Piggyback:2025] Out: 975 [Urine:775; Blood:200]     PHYSICAL EXAMINATION: General:  Lying in bed NAD. On venni mask HEENT: normal x for alopecia Neck: supple. no JVD. Carotids 2+ bilat; no bruits. No lymphadenopathy or thryomegaly appreciated. Cor: PMI nondisplaced. Regular rate & rhythm. No rubs, gallops or murmurs. Lungs: clear Abdomen: soft, mildly tender. Non-distended. + dressing. LLQ colostomy. Quiet Extremities: no cyanosis, clubbing, rash, edema Neuro: alert & orientedx3, cranial nerves grossly intact. moves all 4 extremities w/o difficulty. Affect pleasant  LABS: PULMONARY  Recent Labs Lab 02/11/14 0030  TCO2 29    CBC  Recent Labs Lab 02/06/14 1050  02/11/14 0022 02/11/14 0030 02/11/14 1459 02/11/14 1945  HGB 11.0*  < > 10.2* 10.2* 8.5* 7.6*  HCT 33.3*  < > 31.0* 30.0* 25.7* 23.4*  WBC 9.7  --  9.2  --   --  3.0*  PLT  344  --  255  --   --  171  < > = values in this interval not displayed.  COAGULATION No results found for this basename: INR,  in the last 168 hours  [place] Lab 02/11/14 1945  TROPONINI <0.30   No results found for this basename: PROBNP,  in the last 168 hours   CHEMISTRY  Recent Labs Lab 02/06/14 1050 02/11/14 0030 02/11/14 1945  NA 140 138  --   K 3.9 3.7  --   CL  --  100  --   CO2 25  --   --   GLUCOSE 108 109*  --   BUN 15.3 23  --   CREATININE 0.8 0.80  --   CALCIUM 9.5  --   --   MG  --   --  1.6  PHOS  --   --  3.0   The CrCl is unknown because both a height and weight (above a minimum accepted value) are required for this calculation.   LIVER  Recent Labs Lab 02/06/14 1050  AST 11  ALT 15  ALKPHOS 130  BILITOT 0.34  PROT 6.9  ALBUMIN 3.1*     INFECTIOUS  Recent Labs Lab 02/11/14 1945  LATICACIDVEN 0.9     ENDOCRINE CBG (last 3)  No results found for this basename: GLUCAP,  in the last 72 hours       IMAGING x48h  Ct Abdomen Pelvis W Contrast  02/11/2014   CLINICAL DATA:  Constipation, left lower quadrant pain, right breast cancer.  EXAM: CT ABDOMEN AND PELVIS WITH CONTRAST  TECHNIQUE: Multidetector CT imaging of the abdomen and pelvis was performed using the standard protocol following bolus administration of intravenous contrast.  CONTRAST:  83mL OMNIPAQUE IOHEXOL 300 MG/ML SOLN, 182mL OMNIPAQUE IOHEXOL 300 MG/ML SOLN  COMPARISON:  Included view of the lung bases demonstrate enhancing atelectasis lingula. The heart appears at least mild to moderately enlarged, incompletely characterized. The visualized pericardium is unremarkable. . Included view of the chest demonstrates at least 6.8 x 4.5 cm right breast cystic mass, with peripheral surgical clips, 5 Hounsfield units.  Small to moderate amount of pneumoperitoneum. Sigmoid diverticulitis with perforation, 17 x 23 mm contiguous intramural abscess. In addition,  inflammatory changes about the descending colon with a 16 x 34 x51 mm rim enhancing fluid collection within the left paracolic gutter, consistent with abscess. Moderate amount of retained large bowel stool.  The liver, spleen, pancreas and adrenal glands are unremarkable. Cholelithiasis without superimposed inflammatory changes to suggest acute cholecystitis.  The stomach, small bowel are normal in course and caliber without inflammatory changes. However, slight thickening of the gastric antrum, which could reflect gastritis. Enteric contrast has not yet reached the distal small bowel. The appendix is not discretely identified, however there are no inflammatory changes in the right lower quadrant.  Kidneys are orthotopic, demonstrating symmetric enhancement without nephrolithiasis, hydronephrosis or solid renal masses. 26 mm cyst arises from the lower pole of right kidney. The unopacified ureters are normal in course and caliber. Delayed imaging through the kidneys demonstrates symmetric prompt excretion to the proximal urinary collecting system. Urinary bladder is partially distended and unremarkable.  Aortoiliac vessels are normal in course and caliber. No lymphadenopathy by CT size criteria. Status post hysterectomy. The soft tissues and included osseous structures are nonsuspicious. 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 Severe L4-5 degenerative disc disease with severe lower lumbar facet arthropathy resulting in severe L3-4 and L4-5 neural foraminal narrowing.  FINDINGS: Perforated sigmoid diverticulitis, with small to moderate amount of pneumoperitoneum. 17 x 23 mm intramural abscess of the segment colon, in addition to a 16 x 34 x 51 mm abscess within the left pericolic gutter. Moderate amount of retained large bowel stool without bowel obstruction.  Partially imaged 6.8 x 4.5 cm right cystic breast mass, this could reflect seroma, recommend correlation with surgical history.   Findings discussed with and reconfirmed by Dr.Opitz on5/16/2015at3:16 AM.   Electronically Signed   By: Elon Alas   On: 02/11/2014 03:16   Dg Abd Acute W/chest  02/11/2014   CLINICAL DATA:  Left abdominal pain and constipation. History of breast cancer.  EXAM: ACUTE ABDOMEN SERIES (ABDOMEN 2 VIEW & CHEST 1 VIEW)  COMPARISON:  01/02/2014.  FINDINGS: Borderline enlarged cardiac silhouette with an interval decrease in size. Stable left subclavian porta catheter. Clear lungs. Stable right lower neck surgical clips.  Normal bowel gas pattern without free peritoneal air. Possible small lower pole right renal calculus. Right breast surgical clips. Thoracolumbar spine degenerative changes and mild scoliosis.  IMPRESSION: 1. No acute abnormality. 2. Borderline cardiomegaly with improvement. 3. Possible small lower pole right renal calculus.   Electronically Signed   By: Enrique Sack M.D.   On: 02/11/2014 01:31       ASSESSMENT / PLAN:  PULMONARY A:Acute respiratory failure P: now improving. Wean FM to nasal cannula as tolerated. Turn IVF to 75/hr. Likely stop IVF in am.    CARDIOVASCULAR A: Hypotension, post-op. Presumed septic shock. P: Presumed sepsis due to ruptured diverticuli with abscess. Now s/p resection. Cover with Zosyn/vanc and micafungin.  Continue IVF (switched to NS). Stress dose steroids started. Neo for BP support as needed. Marland Kitchen   GASTROINTESTINAL A:  H/o GERD P:  Continue PPI  HEMATOLOGIC A:  Anemia, post-op P: Transfuse as needed.   INFECTIOUS A:  Septic shock P:  Cover with Zosyn//Vanco/Micafungin. (discussed with ID - vanc x 48 hours to cover possible enterococcus pending cx results) Wound cx pending. [place] ordered. Support with IVF and pressors as needed.    ENDOCRINE A:  ?Adrenal insufficiency  P:  On stress dose steroids. Cover with SSI.   ONCOLOGY A: H/o breast CA P: s/p right mastectomy. Cystic breast mass partially seen on ab CT. Likely post-op changes. Will  need f/u.   F/E/N A: Hyponatremia/hyperkalemia/hypomagnesemia P: Will repeat BMET. Supplement electrolytes as need. Treat hyperkalemia if real.   SURGICAL A: Ruptured colonic diverticuli with abscess. S/p laparotomy with colostomy placement 5/16 P: As per [facility].  Continue PCA.  DVT Prophylaxis A: On SQ heparin  TODAY'S SUMMARY:  Much improved with IVF, abx and stress-dose steroids. Can use neo for BP support as needed. Await cx results. CCM will follow.    The patient is critically ill with multiple organ systems failure and requires high complexity decision making for assessment and support, frequent evaluation and titration of therapies, application of advanced monitoring technologies and extensive interpretation of multiple databases.   Critical Care Time devoted to patient care services described in this note is  40  Minutes.  Pierre Bali, MD CCM Coverage Pager: [phone], If no answer or between  15:00h - 7:00h: call [phone] 02/11/2014 8:35 PM

## 2014-02-11 NOTE — ED Notes (Signed)
Pt. Is transported to OR per stretcher, denied of any pain . Alert and oriented x3.

## 2014-02-11 NOTE — ED Notes (Signed)
NP at bedside.

## 2014-02-11 NOTE — H&P (Signed)
Re:   Martha Evans, Martha Evans  ASSESSMENT AND PLAN: 1.  Pnuemoperitoneum/abscesses along left colon gutter/sigmoid colon - probably secondard to perforated colon  She is on chemotx, has free air, but not sick looking.  I think she is best explored with probable colon resection and ostomy.  I explained the surgery to the patient and husband.  Indications and risks explained to patient.  2.  Right breast cancer, T1, Nx  Invasive ductal ca (metaplastic), 0.8 cm,  Grade II-III, ER 0%, PR 0%, Ki-67 - 84%, HER-2/neu negative.   Has had 2 cycles of Docetaxel and Cyclophosphamide - Dr. Katherina Mires Cornetto  Right partial mastectomy - 11/2013 - T. Gerkin  Sees Dr. Pablo Ledger for rad tx.  She is interested in getting her rad tx in [place].  3.  HTN 4.  GERD 5.  Left subclavian power port - April 2015 - Gerkin  Chief Complaint  Patient presents with  . Constipation  . Arm Pain   REFERRING PHYSICIAN:  Manuela Neptune, PA,  [facility]  HISTORY OF PRESENT ILLNESS: Martha Evans is a 68 y.o. (DOB: [birth year])  white  female whose primary care physician is [name], MD and comes to [facility] today for abdominal pain. Her husband, Jenny Reichmann, and friend, Zigmund Daniel, are in the room with her.  She did have some constipation following cycle 1 of chemotherapy.  She got her Neulasta shot Tuesday, 5/12, and developed abdominal pain.  On Wednesday, the pain was became worse, but she thought it was due to her chemotx.  She felt a little better Thursday, 5/14, but on Friday, started feeling worse again.  She had a temp of 100.3.  She came to the [facility] for further evaluation.   She has no history of colon disease.  She thinks that she had a colonoscopy in 2009 in [place].  Her prior abdominal surgery - appendectomy 1960, hysterectomy for endometriosis - 1999, abdominoplasty - 1990, "lipoma" from skiing accident removed 1980's.  Abdominal CT scan - 02/11/2014 - Perforated sigmoid diverticulitis,  with small to moderate amount of pneumoperitoneum. 17 x 23 mm intramural abscess of the segment colon, in addition to a 16 x 34 x 51 mm abscess within the left pericolic gutter. Moderate amount of retained large bowel stool without bowel obstruction.  Partially imaged 6.8 x 4.5 cm right cystic breast mass, this could  reflect seroma, recommend correlation with surgical history.  Past Medical History  Diagnosis Date  . Hypertension   . Allergy   . Wears hearing aid     right  . GERD (gastroesophageal reflux disease)   . Complication of anesthesia     was hard to intubate with thyroid surgery-99  . Difficult intubation 1999    when had thyroid lobectomy  . Dysrhythmia     irregular reuglar heart beat   . Cancer     breast cancer       Past Surgical History  Procedure Laterality Date  . Thyroid lobectomy  1999    right  . Rotator cuff repair  2006    right shoulder  . Breast reduction surgery Bilateral     1980's  . Abdominal hysterectomy  1999  . Tonsillectomy      as child  . Appendectomy      as child  . Colonoscopy    . Partial mastectomy with needle localization and axillary sentinel lymph node bx Right 12/12/2013    Procedure: PARTIAL MASTECTOMY WITH  NEEDLE LOCALIZATION AND AXILLARY SENTINEL LYMPH NODE BX;  Surgeon: Earnstine Regal, MD;  Location: [place];  Service: General;  Laterality: Right;  . Portacath placement Left 01/02/2014    Procedure: INSERTION PORT-A-CATH;  Surgeon: Earnstine Regal, MD;  Location: [facility];  Service: General;  Laterality: Left;      No current facility-administered medications for this encounter.   Current Outpatient Prescriptions  Medication Sig Dispense Refill  . calcium carbonate (TUMS - DOSED IN MG ELEMENTAL CALCIUM) 500 MG chewable tablet Chew 1 tablet by mouth daily. As needed      . calcium-vitamin D (OSCAL WITH D) 500-200 MG-UNIT per tablet Take 1 tablet by mouth daily.       Marland Kitchen dexamethasone (DECADRON) 4 MG tablet Take  2 tablets (8 mg total) by mouth 2 (two) times daily. Start the day before Taxotere. Then again the day after chemo for 3 days.  30 tablet  1  . lidocaine-prilocaine (EMLA) cream Apply topically as needed.  30 g  6  . loratadine (CLARITIN) 10 MG tablet Take 10 mg by mouth daily. lst dose 12/11/13      . LORazepam (ATIVAN) 0.5 MG tablet Take 1 tablet (0.5 mg total) by mouth every 6 (six) hours as needed (Nausea or vomiting).  30 tablet  0  . omeprazole (PRILOSEC) 20 MG capsule Take 20 mg by mouth daily.      . ondansetron (ZOFRAN) 8 MG tablet Take 1 tablet (8 mg total) by mouth 2 (two) times daily. Start the day after chemo for 3 days. Then take as needed for nausea or vomiting.  30 tablet  1  . polyethylene glycol (MIRALAX / GLYCOLAX) packet Take 17 g by mouth daily as needed for mild constipation.      . prochlorperazine (COMPAZINE) 10 MG tablet Take 1 tablet (10 mg total) by mouth every 6 (six) hours as needed (Nausea or vomiting).  30 tablet  1  . sennosides-docusate sodium (SENOKOT-S) 8.6-50 MG tablet Take 1 tablet by mouth daily.      Marland Kitchen telmisartan (MICARDIS) 40 MG tablet Take 40 mg by mouth daily.          Allergies  Allergen Reactions  . Codeine Sulfate Nausea And Vomiting  . Tramadol Nausea Only    Nausea     REVIEW OF SYSTEMS: Skin:  No history of rash.  No history of abnormal moles. Infection:  No history of hepatitis or HIV.  No history of MRSA. Neurologic:  No history of stroke.  No history of seizure.  No history of headaches. Cardiac:  HTN.  Has regular dysrythmia.  Seen by Dr. Ubaldo Glassing in [place]. Pulmonary:  Does not smoke cigarettes.  No asthma or bronchitis.  No OSA/CPAP. Breasts:  Bilateral breast reduction - 1980's.  Right breast cancer, T1, N0 - on active chemotx - [facility]/Gerkin  Endocrine:  No diabetes. History of partial thyroidectomy by Dr. Harlow Asa - 1999. Gastrointestinal:  See HPI. Musculoskeletal:  Right shoulder rotator cuff - 2006 Hematologic:  Getting  chemotx, but WBC and Hbg look okay. Psycho-social:  The patient is oriented.   The patient has no obvious psychologic or social impairment to understanding our conversation and plan.  SOCIAL and FAMILY HISTORY: Married.   Husband John No children. Retired from [employer].  PHYSICAL EXAM: BP 126/63  Pulse 88  Temp(Src) 98.5 F (36.9 C) (Oral)  Resp 20  SpO2 95%  General: WN WF who is alert.  She has lost her  hair.Marland Kitchen  HEENT: Normal. Pupils equal. Neck: Supple. No mass.  Thyroid scar. Lymph Nodes:  No supraclavicular or cervical nodes. Lungs: Clear to auscultation and symmetric breath sounds. Heart:  RRR. No murmur or rub. Breasts: Bilateral reduction mammoplasty scar.  Scar from lumpectomy in LOQ of right breast. Abdomen: Soft. Tender in left abdomen.  Has abdominoplasty scar. Rectal: Not done. Extremities:  Good strength and ROM  in upper and lower extremities. Neurologic:  Grossly intact to motor and sensory function. Psychiatric: Has normal mood and affect. Behavior is normal.   DATA REVIEWED: Epic notes.  Alphonsa Overall, MD,  [facility], [address]  [place], [place]    [place] Phone:  [phone] FAX:  [phone]

## 2014-02-12 ENCOUNTER — Inpatient Hospital Stay (HOSPITAL_COMMUNITY): Payer: Medicare Other

## 2014-02-12 DIAGNOSIS — R6521 Severe sepsis with septic shock: Secondary | ICD-10-CM

## 2014-02-12 DIAGNOSIS — K668 Other specified disorders of peritoneum: Secondary | ICD-10-CM

## 2014-02-12 DIAGNOSIS — C50519 Malignant neoplasm of lower-outer quadrant of unspecified female breast: Secondary | ICD-10-CM

## 2014-02-12 DIAGNOSIS — A419 Sepsis, unspecified organism: Secondary | ICD-10-CM | POA: Diagnosis not present

## 2014-02-12 DIAGNOSIS — K651 Peritoneal abscess: Secondary | ICD-10-CM

## 2014-02-12 LAB — BASIC METABOLIC PANEL
BUN: 11 mg/dL (ref 6–23)
CO2: 23 meq/L (ref 19–32)
CREATININE: 0.59 mg/dL (ref 0.50–1.10)
Calcium: 7.2 mg/dL — ABNORMAL LOW (ref 8.4–10.5)
Chloride: 97 mEq/L (ref 96–112)
GFR calc Af Amer: >90 mL/min (ref >90)
GFR calc non Af Amer: >90 mL/min (ref >90)
Glucose, Bld: 390 mg/dL — ABNORMAL HIGH (ref 70–99)
Potassium: 3.6 mEq/L — ABNORMAL LOW (ref 3.7–5.3)
SODIUM: 130 meq/L — AB (ref 137–147)

## 2014-02-12 LAB — GLUCOSE, CAPILLARY
GLUCOSE-CAPILLARY: 118 mg/dL — AB (ref 70–99)
Glucose-Capillary: 104 mg/dL — ABNORMAL HIGH (ref 70–99)
Glucose-Capillary: 133 mg/dL — ABNORMAL HIGH (ref 70–99)
Glucose-Capillary: 170 mg/dL — ABNORMAL HIGH (ref 70–99)
Glucose-Capillary: 99 mg/dL (ref 70–99)

## 2014-02-12 LAB — LACTIC ACID, PLASMA
Lactic Acid, Venous: 1 mmol/L (ref 0.5–2.2)
Lactic Acid, Venous: 0.7 mmol/L (ref 0.5–2.2)

## 2014-02-12 LAB — CBC
HCT: 24.2 % — ABNORMAL LOW (ref 36.0–46.0)
Hemoglobin: 7.8 g/dL — ABNORMAL LOW (ref 12.0–15.0)
MCH: 29.4 pg (ref 26.0–34.0)
MCHC: 32.2 g/dL (ref 30.0–36.0)
MCV: 91.3 fL (ref 78.0–100.0)
PLATELETS: 265 10*3/uL (ref 150–400)
RBC: 2.65 MIL/uL — AB (ref 3.87–5.11)
RDW: 13.5 % (ref 11.5–15.5)
WBC: 6 10*3/uL (ref 4.0–10.5)

## 2014-02-12 LAB — CALCIUM, IONIZED: Calcium, Ion: 1.09 mmol/L — ABNORMAL LOW (ref 1.13–1.30)

## 2014-02-12 LAB — TROPONIN I
Troponin I: <0.3 ng/mL (ref <0.30)
Troponin I: <0.3 ng/mL (ref <0.30)

## 2014-02-12 MED ORDER — VANCOMYCIN HCL IN DEXTROSE 1-5 GM/200ML-% IV SOLN
1000.0000 mg | Freq: Two times a day (BID) | INTRAVENOUS | Status: DC
  Administered 2014-02-12 – 2014-02-16 (×10): 1000 mg via INTRAVENOUS
  Filled 2014-02-12 (×11): qty 200

## 2014-02-12 MED ORDER — VITAMINS A & D EX OINT
TOPICAL_OINTMENT | CUTANEOUS | Status: AC
  Administered 2014-02-12: 11:00:00
  Filled 2014-02-12: qty 5

## 2014-02-12 MED ORDER — PHENYLEPHRINE HCL 10 MG/ML IJ SOLN
30.0000 ug/min | INTRAVENOUS | Status: DC
  Administered 2014-02-12: 90 ug/min via INTRAVENOUS
  Administered 2014-02-12: 50 ug/min via INTRAVENOUS
  Filled 2014-02-12 (×2): qty 4

## 2014-02-12 MED ORDER — INSULIN ASPART 100 UNIT/ML ~~LOC~~ SOLN
0.0000 [IU] | SUBCUTANEOUS | Status: DC
  Administered 2014-02-12: 3 [IU] via SUBCUTANEOUS
  Administered 2014-02-12 – 2014-02-13 (×2): 2 [IU] via SUBCUTANEOUS

## 2014-02-12 NOTE — Progress Notes (Signed)
General Surgery Note  LOS: 2 days  POD -  1 Day Post-Op  Assessment/Plan: 1.  EXPLORATORY LAPAROTOMY, Left COLON RESECTION, COLOSTOMY - 02/11/2014 - D. Dniya Neuhaus  For perforation of prox left colon - question diverticular  On Zosyn/Vanc/Micafungin - 5/16 >>>  BP soft yesterday - on Neo synephrine.  Appreciate CCM assistance.  Looks okay.   1A.  Wound open - looks good  BID dressing changes  2. Right breast cancer, T1, Nx   Invasive ductal ca (metaplastic), 0.8 cm, Grade II-III, ER 0%, PR 0%, Ki-67 - 84%, HER-2/neu negative.   Has had 2 cycles of Docetaxel and Cyclophosphamide - Dr. Katherina Mires Cornetto   Right partial mastectomy - 11/2013 - T. Gerkin   Sees Dr. Pablo Ledger for rad tx. She is interested in getting her rad tx in [place].  3. HTN  4. GERD  5. Left subclavian power port - April 2015 - Gerkin   6.  DVT prophylaxis - SQ Heparin 7.  Anemia  Hgb - 7.8 - 02/12/2014  Combination of blood loss, chemotx, and resuscitation 8.  Foley  Will continue while on pressors 9.  Malnutrition  Albumin - 2.0 - 02/11/2014 10.  Steroid coverage   Active Problems:   Perforated sigmoid colon  Subjective:  Looks okay.  Husband at bedside.  Pain fairly well controlled.  Objective:   Filed Vitals:   02/12/14 0645  BP: 133/51  Pulse: 48  Temp:   Resp: 12     Intake/Output from previous day:  05/16 0701 - 05/17 0700 In: 7300 [I.V.:4850; IV Piggyback:2450] Out: 2175 [Urine:1975; Blood:200]  Intake/Output this shift:      Physical Exam:   General: WN WF who is alert and oriented.    HEENT: Normal. Pupils equal. .   Lungs: Clear.  IS to 1,200 cc.   Abdomen: Soft.  Quiet.  Ostomy in LUQ pink.   Wound: Clean.   Neurologic:  Grossly intact to motor and sensory function.   Psychiatric: Has normal mood and affect.   Lab Results:    Recent Labs  02/11/14 2109 02/12/14 0430  WBC 2.9* 6.0  HGB 8.4* 7.8*  HCT 25.5* 24.2*  PLT 208 265    BMET   Recent Labs  02/11/14 2109  02/12/14 0430  NA 136* 130*  K 4.1 3.6*  CL 100 97  CO2 26 23  GLUCOSE 165* 390*  BUN 15 11  CREATININE 0.60 0.59  CALCIUM 7.6* 7.2*    PT/INR  No results found for this basename: LABPROT, INR,  in the last 72 hours  ABG  No results found for this basename: PHART, PCO2, PO2, HCO3,  in the last 72 hours   Studies/Results:  Ct Abdomen Pelvis W Contrast  02/11/2014   CLINICAL DATA:  Constipation, left lower quadrant pain, right breast cancer.  EXAM: CT ABDOMEN AND PELVIS WITH CONTRAST  TECHNIQUE: Multidetector CT imaging of the abdomen and pelvis was performed using the standard protocol following bolus administration of intravenous contrast.  CONTRAST:  67m OMNIPAQUE IOHEXOL 300 MG/ML SOLN, 109mOMNIPAQUE IOHEXOL 300 MG/ML SOLN  COMPARISON:  Included view of the lung bases demonstrate enhancing atelectasis lingula. The heart appears at least mild to moderately enlarged, incompletely characterized. The visualized pericardium is unremarkable. . Included view of the chest demonstrates at least 6.8 x 4.5 cm right breast cystic mass, with peripheral surgical clips, 5 Hounsfield units.  Small to moderate amount of pneumoperitoneum. Sigmoid diverticulitis with perforation, 17 x 23 mm contiguous intramural abscess.  In addition, inflammatory changes about the descending colon with a 16 x 34 x51 mm rim enhancing fluid collection within the left paracolic gutter, consistent with abscess. Moderate amount of retained large bowel stool.  The liver, spleen, pancreas and adrenal glands are unremarkable. Cholelithiasis without superimposed inflammatory changes to suggest acute cholecystitis.  The stomach, small bowel are normal in course and caliber without inflammatory changes. However, slight thickening of the gastric antrum, which could reflect gastritis. Enteric contrast has not yet reached the distal small bowel. The appendix is not discretely identified, however there are no inflammatory changes in the right  lower quadrant.  Kidneys are orthotopic, demonstrating symmetric enhancement without nephrolithiasis, hydronephrosis or solid renal masses. 26 mm cyst arises from the lower pole of right kidney. The unopacified ureters are normal in course and caliber. Delayed imaging through the kidneys demonstrates symmetric prompt excretion to the proximal urinary collecting system. Urinary bladder is partially distended and unremarkable.  Aortoiliac vessels are normal in course and caliber. No lymphadenopathy by CT size criteria. Status post hysterectomy. The soft tissues and included osseous structures are nonsuspicious. 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 Severe L4-5 degenerative disc disease with severe lower lumbar facet arthropathy resulting in severe L3-4 and L4-5 neural foraminal narrowing.  FINDINGS: Perforated sigmoid diverticulitis, with small to moderate amount of pneumoperitoneum. 17 x 23 mm intramural abscess of the segment colon, in addition to a 16 x 34 x 51 mm abscess within the left pericolic gutter. Moderate amount of retained large bowel stool without bowel obstruction.  Partially imaged 6.8 x 4.5 cm right cystic breast mass, this could reflect seroma, recommend correlation with surgical history.  Findings discussed with and reconfirmed by Dr.Opitz on5/16/2015at3:16 AM.   Electronically Signed   By: Elon Alas   On: 02/11/2014 03:16   Dg Chest Port 1v Same Day  02/12/2014   CLINICAL DATA:  Sepsis. Recent colon resection. History of breast cancer and hypertension.  EXAM: PORTABLE CHEST - 1 VIEW SAME DAY  COMPARISON:  Yesterday.  FINDINGS: Enlarged cardiac silhouette with an interval increase in size, accentuated by a decreased inspiration. Interval mild patchy opacity at the left lung base. Mild increase in prominence of the interstitial markings, also accentuated by the decreased inspiration. Right breast surgical clips. Stable left subclavian port catheter and right  lower neck surgical clips. Thoracic spine degenerative changes.  IMPRESSION: 1. Interval mild patchy opacity at the left lung base, suspicious for pneumonia. 2. Interval cardiomegaly and probable minimal interstitial pulmonary edema.   Electronically Signed   By: Enrique Sack M.D.   On: 02/12/2014 06:24   Dg Abd Acute W/chest  02/11/2014   CLINICAL DATA:  Left abdominal pain and constipation. History of breast cancer.  EXAM: ACUTE ABDOMEN SERIES (ABDOMEN 2 VIEW & CHEST 1 VIEW)  COMPARISON:  01/02/2014.  FINDINGS: Borderline enlarged cardiac silhouette with an interval decrease in size. Stable left subclavian porta catheter. Clear lungs. Stable right lower neck surgical clips.  Normal bowel gas pattern without free peritoneal air. Possible small lower pole right renal calculus. Right breast surgical clips. Thoracolumbar spine degenerative changes and mild scoliosis.  IMPRESSION: 1. No acute abnormality. 2. Borderline cardiomegaly with improvement. 3. Possible small lower pole right renal calculus.   Electronically Signed   By: Enrique Sack M.D.   On: 02/11/2014 01:31     Anti-infectives:   Anti-infectives   Start     Dose/Rate Route Frequency Ordered Stop   02/12/14 1000  vancomycin (  VANCOCIN) IVPB 1000 mg/200 mL premix     1,000 mg 200 mL/hr over 60 Minutes Intravenous Every 12 hours 02/12/14 0537     02/11/14 2130  vancomycin (VANCOCIN) 1,250 mg in sodium chloride 0.9 % 250 mL IVPB     1,250 mg 166.7 mL/hr over 90 Minutes Intravenous  Once 02/11/14 2117 02/11/14 2310   02/11/14 2030  micafungin (MYCAMINE) 100 mg in sodium chloride 0.9 % 100 mL IVPB     100 mg 100 mL/hr over 1 Hours Intravenous Daily at bedtime 02/11/14 1939     02/11/14 1800  piperacillin-tazobactam (ZOSYN) IVPB 3.375 g     3.375 g 12.5 mL/hr over 240 Minutes Intravenous Every 8 hours 02/11/14 1150     02/11/14 0330  piperacillin-tazobactam (ZOSYN) IVPB 3.375 g  Status:  Discontinued     3.375 g 12.5 mL/hr over 240 Minutes  Intravenous  Once 02/11/14 0318 02/11/14 0320   02/11/14 0330  piperacillin-tazobactam (ZOSYN) IVPB 3.375 g     3.375 g 100 mL/hr over 30 Minutes Intravenous  Once 02/11/14 0321 02/11/14 0410      Alphonsa Overall, MD, FACS Pager: [facility] Office: [phone] 02/12/2014

## 2014-02-12 NOTE — Progress Notes (Signed)
ANTIBIOTIC CONSULT NOTE - INITIAL  Pharmacy Consult for vancomycin Indication: rule out sepsis  Allergies  Allergen Reactions  . Codeine Sulfate Nausea And Vomiting  . Tramadol Nausea Only    Nausea     Patient Measurements: Height: 5\' 6"  (167.6 cm) Weight: 184 lb (83.462 kg) IBW/kg (Calculated) : 59.3 Adjusted Body Weight:   Vital Signs: Temp: 97.7 F (36.5 C) (05/17 0400) Temp src: Oral (05/17 0400) BP: 147/49 mmHg (05/17 0500) Pulse Rate: 48 (05/17 0500) Intake/Output from previous day: 05/16 0701 - 05/17 0700 In: 7212.5 [I.V.:4775; IV Piggyback:2437.5] Out: 1975 [Urine:1775; Blood:200] Intake/Output from this shift: Total I/O In: 1087.5 [I.V.:675; IV Piggyback:412.5] Out: 1000 [Urine:1000]  Labs:  Recent Labs  02/11/14 1945 02/11/14 2109 02/12/14 0430  WBC 3.0* 2.9* 6.0  HGB 7.6* 8.4* 7.8*  PLT 171 208 265  CREATININE QUESTIONABLE RESULTS, RECOMMEND RECOLLECT TO VERIFY 0.60 0.59   Estimated Creatinine Clearance: 75.3 ml/min (by C-G formula based on Cr of 0.59). No results found for this basename: VANCOTROUGH, VANCOPEAK, VANCORANDOM, GENTTROUGH, GENTPEAK, GENTRANDOM, TOBRATROUGH, TOBRAPEAK, TOBRARND, AMIKACINPEAK, AMIKACINTROU, AMIKACIN,  in the last 72 hours   Microbiology: No results found for this or any previous visit (from the past 720 hour(s)).  Medical History: Past Medical History  Diagnosis Date  . Hypertension   . Allergy   . Wears hearing aid     right  . GERD (gastroesophageal reflux disease)   . Complication of anesthesia     was hard to intubate with thyroid surgery-99  . Difficult intubation 1999    when had thyroid lobectomy  . Dysrhythmia     irregular reuglar heart beat   . Cancer     breast cancer     Medications:  Anti-infectives   Start     Dose/Rate Route Frequency Ordered Stop   02/12/14 1000  vancomycin (VANCOCIN) IVPB 1000 mg/200 mL premix     1,000 mg 200 mL/hr over 60 Minutes Intravenous Every 12 hours 02/12/14  0537     02/11/14 2130  vancomycin (VANCOCIN) 1,250 mg in sodium chloride 0.9 % 250 mL IVPB     1,250 mg 166.7 mL/hr over 90 Minutes Intravenous  Once 02/11/14 2117 02/11/14 2310   02/11/14 2030  micafungin (MYCAMINE) 100 mg in sodium chloride 0.9 % 100 mL IVPB     100 mg 100 mL/hr over 1 Hours Intravenous Daily at bedtime 02/11/14 1939     02/11/14 1800  piperacillin-tazobactam (ZOSYN) IVPB 3.375 g     3.375 g 12.5 mL/hr over 240 Minutes Intravenous Every 8 hours 02/11/14 1150     02/11/14 0330  piperacillin-tazobactam (ZOSYN) IVPB 3.375 g  Status:  Discontinued     3.375 g 12.5 mL/hr over 240 Minutes Intravenous  Once 02/11/14 0318 02/11/14 0320   02/11/14 0330  piperacillin-tazobactam (ZOSYN) IVPB 3.375 g     3.375 g 100 mL/hr over 30 Minutes Intravenous  Once 02/11/14 0321 02/11/14 0410     Assessment: Patient with sepsis.  First dose of antibiotics already given    Goal of Therapy:  Vancomycin trough level 15-20 mcg/ml  Plan:  Measure antibiotic drug levels at steady state Follow up culture results Vancoycin 1gm iv q12hr  [employer]. 02/12/2014,5:38 AM

## 2014-02-12 NOTE — Progress Notes (Signed)
PULMONARY  / CRITICAL CARE MEDICINE  Name: Martha Evans MRN: [id number] DOB: Jun 23, 1946 PCP [name], MD   ADMISSION DATE:  02/10/2014 LOS 2 days  CONSULTATION DATE:  02/11/14  REFERRING MD :  [facility] PRIMARY SERVICE: [facility]  CHIEF COMPLAINT:  Hypotension  BRIEF PATIENT DESCRIPTION/HPI: 68 y/o woman with T1Nx R breast CA s/p chemo and R partial mastectomy (3/15) admitted with pneumoperitoneum due to perforated colonic divertculi with surrounding abscess. Underwent emergent laparotomy and colon resection with colostomy placement on 02/11/14. Post-op with persistent hypotension with SBP 70s. CCM called to consult.   On dexamethasone preadmission so she has been started on stress-dose steroids. Micafungin added to zosyn.   Labs back this evening: lactate 0.9. Na 128. Sodium 6.4. Mag 1.6 (likely inaccurate). Neo ordered but not started. Has gotten > 2L IVF. SBP 110 now. Feels better. Mild ab pain. Otherwise no complaints.    LINES / TUBES: Port-a-cath:  CULTURES: Wound culture 5/16 >>> pending Blood cx x 2 5/16 >>> pending UA 5/16 >>> negative  ANTIBIOTICS: Zosyn 5/16 Micafungin 5/16 Vanco 5/16  SIGNIFICANT EVENTS / STUDIES:  Laparotomy with colon resection 5/16  SUBJECTIVE:     VITAL SIGNS: Filed Vitals:   02/12/14 0615 02/12/14 0630 02/12/14 0645 02/12/14 0800  BP: 108/63 131/71 133/51   Pulse: 59 57 48   Temp:      TempSrc:      Resp: 16 17 12 10   Height:      Weight:      SpO2: 94% 98% 99% 100%   HEMODYNAMICS:   VENTILATOR SETTINGS: Vent Mode:  [-] PRVC FiO2 (%):  [30 %] 30 % Set Rate:  [28 bmp] 28 bmp Vt Set:  [420 mL] 420 mL PEEP:  [5 cmH20] 5 cmH20 Plateau Pressure:  [4 HWE99-37 cmH20] 20 cmH20 INTAKE / OUTPUT: I/O last 3 completed shifts: In: 7300 [I.V.:4850; IV JIRCVELFY:1017] Out: 2175 [PZWCH:8527; Blood:200]     PHYSICAL EXAMINATION: General:  Lying in bed NAD. On venti mask HEENT: normal x for alopecia Neck: supple. no JVD.  Carotids 2+ bilat; no bruits. No lymphadenopathy or thryomegaly appreciated. Cor: PMI nondisplaced. Regular rate & rhythm. No rubs, gallops or murmurs. Lungs: clear Abdomen: soft, mildly tender. Non-distended. + dressing. LLQ colostomy. Quiet Extremities: no cyanosis, clubbing, rash, edema Neuro: alert & orientedx3, cranial nerves grossly intact. moves all 4 extremities w/o difficulty. Affect pleasant  LABS: PULMONARY  Recent Labs Lab 02/11/14 0030  TCO2 29    CBC  Recent Labs Lab 02/11/14 1945 02/11/14 2109 02/12/14 0430  HGB 7.6* 8.4* 7.8*  HCT 23.4* 25.5* 24.2*  WBC 3.0* 2.9* 6.0  PLT 171 208 265    COAGULATION No results found for this basename: INR,  in the last 168 hours  CARDIAC    Recent Labs Lab 02/11/14 1945 02/11/14 2109 02/12/14 0430  TROPONINI QUESTIONABLE RESULTS, RECOMMEND RECOLLECT TO VERIFY <0.30 <0.30   No results found for this basename: PROBNP,  in the last 168 hours   CHEMISTRY  Recent Labs Lab 02/06/14 1050  02/11/14 0030 02/11/14 1945 02/11/14 2109 02/12/14 0430  NA 140  --  138 QUESTIONABLE RESULTS, RECOMMEND RECOLLECT TO VERIFY 136* 130*  K 3.9  < > 3.7 QUESTIONABLE RESULTS, RECOMMEND RECOLLECT TO VERIFY 4.1 3.6*  CL  --   --  100 QUESTIONABLE RESULTS, RECOMMEND RECOLLECT TO VERIFY 100 97  CO2 25  --   --  QUESTIONABLE RESULTS, RECOMMEND RECOLLECT TO VERIFY 26 23  GLUCOSE 108  --  109* QUESTIONABLE RESULTS, RECOMMEND RECOLLECT TO VERIFY 165* 390*  BUN 15.3  --  23 QUESTIONABLE RESULTS, RECOMMEND RECOLLECT TO VERIFY 15 11  CREATININE 0.8  --  0.80 QUESTIONABLE RESULTS, RECOMMEND RECOLLECT TO VERIFY 0.60 0.59  CALCIUM 9.5  --   --  QUESTIONABLE RESULTS, RECOMMEND RECOLLECT TO VERIFY 7.6* 7.2*  MG  --   --   --  QUESTIONABLE RESULTS, RECOMMEND RECOLLECT TO VERIFY 1.9  --   PHOS  --   --   --  QUESTIONABLE RESULTS, RECOMMEND RECOLLECT TO VERIFY 3.0  --   < > = values in this interval not displayed. Estimated Creatinine Clearance:  75.3 ml/min (by C-G formula based on Cr of 0.59).   LIVER  Recent Labs Lab 02/11/14 1945 02/11/14 2109  AST QUESTIONABLE RESULTS, RECOMMEND RECOLLECT TO VERIFY 19  ALT QUESTIONABLE RESULTS, RECOMMEND RECOLLECT TO VERIFY 29  ALKPHOS QUESTIONABLE RESULTS, RECOMMEND RECOLLECT TO VERIFY 96  BILITOT QUESTIONABLE RESULTS, RECOMMEND RECOLLECT TO VERIFY 1.7*  PROT QUESTIONABLE RESULTS, RECOMMEND RECOLLECT TO VERIFY 5.1*  ALBUMIN QUESTIONABLE RESULTS, RECOMMEND RECOLLECT TO VERIFY 2.0*     INFECTIOUS  Recent Labs Lab 02/11/14 1945 02/11/14 2109 02/12/14 0430  LATICACIDVEN QUESTIONABLE RESULTS, RECOMMEND RECOLLECT TO VERIFY 1.3 1.0     ENDOCRINE CBG (last 3)   Recent Labs  02/11/14 2146 02/12/14 0040 02/12/14 0410  GLUCAP 143* 170* 133*         IMAGING x48h  Ct Abdomen Pelvis W Contrast  02/11/2014   CLINICAL DATA:  Constipation, left lower quadrant pain, right breast cancer.  EXAM: CT ABDOMEN AND PELVIS WITH CONTRAST  TECHNIQUE: Multidetector CT imaging of the abdomen and pelvis was performed using the standard protocol following bolus administration of intravenous contrast.  CONTRAST:  25mL OMNIPAQUE IOHEXOL 300 MG/ML SOLN, 197mL OMNIPAQUE IOHEXOL 300 MG/ML SOLN  COMPARISON:  Included view of the lung bases demonstrate enhancing atelectasis lingula. The heart appears at least mild to moderately enlarged, incompletely characterized. The visualized pericardium is unremarkable. . Included view of the chest demonstrates at least 6.8 x 4.5 cm right breast cystic mass, with peripheral surgical clips, 5 Hounsfield units.  Small to moderate amount of pneumoperitoneum. Sigmoid diverticulitis with perforation, 17 x 23 mm contiguous intramural abscess. In addition, inflammatory changes about the descending colon with a 16 x 34 x51 mm rim enhancing fluid collection within the left paracolic gutter, consistent with abscess. Moderate amount of retained large bowel stool.  The liver, spleen,  pancreas and adrenal glands are unremarkable. Cholelithiasis without superimposed inflammatory changes to suggest acute cholecystitis.  The stomach, small bowel are normal in course and caliber without inflammatory changes. However, slight thickening of the gastric antrum, which could reflect gastritis. Enteric contrast has not yet reached the distal small bowel. The appendix is not discretely identified, however there are no inflammatory changes in the right lower quadrant.  Kidneys are orthotopic, demonstrating symmetric enhancement without nephrolithiasis, hydronephrosis or solid renal masses. 26 mm cyst arises from the lower pole of right kidney. The unopacified ureters are normal in course and caliber. Delayed imaging through the kidneys demonstrates symmetric prompt excretion to the proximal urinary collecting system. Urinary bladder is partially distended and unremarkable.  Aortoiliac vessels are normal in course and caliber. No lymphadenopathy by CT size criteria. Status post hysterectomy. The soft tissues and included osseous structures are nonsuspicious. 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 Severe L4-5 degenerative disc disease with severe lower lumbar facet arthropathy resulting in severe L3-4 and L4-5 neural  foraminal narrowing.  FINDINGS: Perforated sigmoid diverticulitis, with small to moderate amount of pneumoperitoneum. 17 x 23 mm intramural abscess of the segment colon, in addition to a 16 x 34 x 51 mm abscess within the left pericolic gutter. Moderate amount of retained large bowel stool without bowel obstruction.  Partially imaged 6.8 x 4.5 cm right cystic breast mass, this could reflect seroma, recommend correlation with surgical history.  Findings discussed with and reconfirmed by Dr.Opitz on5/16/2015at3:16 AM.   Electronically Signed   By: Elon Alas   On: 02/11/2014 03:16   Dg Chest Port 1v Same Day  02/12/2014   CLINICAL DATA:  Sepsis. Recent colon  resection. History of breast cancer and hypertension.  EXAM: PORTABLE CHEST - 1 VIEW SAME DAY  COMPARISON:  Yesterday.  FINDINGS: Enlarged cardiac silhouette with an interval increase in size, accentuated by a decreased inspiration. Interval mild patchy opacity at the left lung base. Mild increase in prominence of the interstitial markings, also accentuated by the decreased inspiration. Right breast surgical clips. Stable left subclavian port catheter and right lower neck surgical clips. Thoracic spine degenerative changes.  IMPRESSION: 1. Interval mild patchy opacity at the left lung base, suspicious for pneumonia. 2. Interval cardiomegaly and probable minimal interstitial pulmonary edema.   Electronically Signed   By: Enrique Sack M.D.   On: 02/12/2014 06:24   Dg Abd Acute W/chest  02/11/2014   CLINICAL DATA:  Left abdominal pain and constipation. History of breast cancer.  EXAM: ACUTE ABDOMEN SERIES (ABDOMEN 2 VIEW & CHEST 1 VIEW)  COMPARISON:  01/02/2014.  FINDINGS: Borderline enlarged cardiac silhouette with an interval decrease in size. Stable left subclavian porta catheter. Clear lungs. Stable right lower neck surgical clips.  Normal bowel gas pattern without free peritoneal air. Possible small lower pole right renal calculus. Right breast surgical clips. Thoracolumbar spine degenerative changes and mild scoliosis.  IMPRESSION: 1. No acute abnormality. 2. Borderline cardiomegaly with improvement. 3. Possible small lower pole right renal calculus.   Electronically Signed   By: Enrique Sack M.D.   On: 02/11/2014 01:31    ASSESSMENT / PLAN:  PULMONARY A:Acute respiratory failure P: now improving. Wean FM to nasal cannula as tolerated.  Decrease IVF to 50/h, prob to [facility] on 5/18    CARDIOVASCULAR A: Hypotension, post-op. Presumed septic shock.  Presumed sepsis due to ruptured diverticuli with abscess. Now s/p resection. Cardiomegaly, mild pulm edema pattern. ? Cardiomegaly related to chemo vs  pre-existing P:  Cover with Zosyn/vanc and micafungin.   Stress dose steroids started.  Neo for BP support as needed. Wean as able TTE on 5/18 to eval LV fxn  GASTROINTESTINAL A:  H/o GERD P:  Continue PPI  HEMATOLOGIC A:  Anemia, post-op P: Transfuse as needed for goal Hgb > 7.0  INFECTIOUS A:  Septic shock P:   Cover with Zosyn//Vanco/Micafungin. (discussed with ID - vanc x 48 hours to cover possible enterococcus pending cx results)  Wound cx pending.  [place] ordered.  Support with IVF and pressors as needed.    ENDOCRINE A:  ?Adrenal insufficiency  P:  On stress dose steroids.  Cover with SSI.   ONCOLOGY A: H/o breast CA P:  s/p right mastectomy.  Cystic breast mass partially seen on ab CT. Likely post-op changes. Will need f/u once stabilized  F/E/N A: Hyponatremia/hyperkalemia/hypomagnesemia P:  Supplement electrolytes as need.   SURGICAL A: Ruptured colonic diverticuli with abscess. S/p laparotomy with colostomy placement 5/16 P:  As per CCS. Continue PCA.  DVT Prophylaxis A: SQ heparin  TODAY'S SUMMARY:     The patient is critically ill with multiple organ systems failure and requires high complexity decision making for assessment and support, frequent evaluation and titration of therapies, application of advanced monitoring technologies and extensive interpretation of multiple databases.   Critical Care Time devoted to patient care services described in this note is  40  Minutes.  Baltazar Apo, MD, PhD 02/12/2014, 9:07 AM [facility] [phone] or if no answer [phone]

## 2014-02-13 ENCOUNTER — Other Ambulatory Visit: Payer: Medicare Other

## 2014-02-13 ENCOUNTER — Encounter (HOSPITAL_COMMUNITY): Payer: Self-pay | Admitting: Surgery

## 2014-02-13 ENCOUNTER — Ambulatory Visit: Payer: Medicare Other | Admitting: Nurse Practitioner

## 2014-02-13 DIAGNOSIS — I517 Cardiomegaly: Secondary | ICD-10-CM

## 2014-02-13 DIAGNOSIS — K631 Perforation of intestine (nontraumatic): Secondary | ICD-10-CM

## 2014-02-13 LAB — GLUCOSE, CAPILLARY
GLUCOSE-CAPILLARY: 102 mg/dL — AB (ref 70–99)
GLUCOSE-CAPILLARY: 104 mg/dL — AB (ref 70–99)
GLUCOSE-CAPILLARY: 113 mg/dL — AB (ref 70–99)
GLUCOSE-CAPILLARY: 102 mg/dL — AB (ref 70–99)
Glucose-Capillary: 130 mg/dL — ABNORMAL HIGH (ref 70–99)
Glucose-Capillary: 97 mg/dL (ref 70–99)
Glucose-Capillary: 95 mg/dL (ref 70–99)

## 2014-02-13 LAB — BASIC METABOLIC PANEL
BUN: 14 mg/dL (ref 6–23)
CALCIUM: 8.1 mg/dL — AB (ref 8.4–10.5)
CO2: 26 meq/L (ref 19–32)
CREATININE: 0.61 mg/dL (ref 0.50–1.10)
Chloride: 104 mEq/L (ref 96–112)
GFR calc Af Amer: >90 mL/min (ref >90)
GFR calc non Af Amer: >90 mL/min (ref >90)
GLUCOSE: 114 mg/dL — AB (ref 70–99)
Potassium: 3.7 mEq/L (ref 3.7–5.3)
Sodium: 140 mEq/L (ref 137–147)

## 2014-02-13 LAB — CBC WITH DIFFERENTIAL/PLATELET
Basophils Absolute: 0 10*3/uL (ref 0.0–0.1)
Basophils Relative: 0 % (ref 0–1)
EOS PCT: 0 % (ref 0–5)
Eosinophils Absolute: 0 10*3/uL (ref 0.0–0.7)
HEMATOCRIT: 23.1 % — AB (ref 36.0–46.0)
Hemoglobin: 7.7 g/dL — ABNORMAL LOW (ref 12.0–15.0)
LYMPHS ABS: 0.6 10*3/uL — AB (ref 0.7–4.0)
LYMPHS PCT: 13 % (ref 12–46)
MCH: 30.1 pg (ref 26.0–34.0)
MCHC: 33.3 g/dL (ref 30.0–36.0)
MCV: 90.2 fL (ref 78.0–100.0)
MONO ABS: 0.7 10*3/uL (ref 0.1–1.0)
Monocytes Relative: 15 % — ABNORMAL HIGH (ref 3–12)
Neutro Abs: 3.5 10*3/uL (ref 1.7–7.7)
Neutrophils Relative %: 72 % (ref 43–77)
Platelets: 247 10*3/uL (ref 150–400)
RBC: 2.56 MIL/uL — AB (ref 3.87–5.11)
RDW: 13.4 % (ref 11.5–15.5)
WBC: 4.9 10*3/uL (ref 4.0–10.5)

## 2014-02-13 LAB — PREALBUMIN: Prealbumin: 6.9 mg/dL — ABNORMAL LOW (ref 17.0–34.0)

## 2014-02-13 NOTE — Plan of Care (Signed)
Problem: Phase I Progression Outcomes Goal: Voiding-avoid urinary catheter unless indicated Outcome: Not Applicable Date Met:  92/76/39 Foley DC'd at 1115am. Goal: NPO or per MD order Outcome: Progressing Pt. Tolerating ice chips.

## 2014-02-13 NOTE — Progress Notes (Signed)
INITIAL NUTRITION ASSESSMENT  DOCUMENTATION CODES Per approved criteria  -Obesity Unspecified   INTERVENTION: - Discussed diet therapy for new colostomy and encouraged low fiber diet, adequate hydration, and to avoid foods that could narrow or block stoma. Handouts provided. Teach back method used, pt expressed understanding, expect good compliance.  - Diet advancement per MD - RD to continue to monitor   NUTRITION DIAGNOSIS: Inadequate oral intake related to inability to eat as evidenced by NPO.   Goal: Advance diet as tolerated to low fiber diet  Monitor:  Weights, labs, diet advancement  Reason for Assessment: Malnutrition screening tool   68 y.o. female  Admitting Dx: Pnuemoperitoneum/abscesses along left colon gutter/sigmoid colon    ASSESSMENT: Pt admitted with abdominal pain, found to have pnuemoperitoneum/abscesses along left colon gutter/sigmoid colon likely secondary to perforated colon. Has right breast CA, invasive ductal CA and is s/p 2 cycles of chemotherapy.  -Pt reports having chemotherapy last Monday and was eating light due to treatment -Was eating things every 2-3 hours like soup or 1/2 sandwich -Reports her weight fluctuates up and down depending on treatment and how well she eats, but normally stays around 186 pounds -Had exploratory laparotomy with removal of left colon and splenic fixture, Hartmann pouch, and left transverse colon end colostomy 5/16 -NPO currently except ice chips  Total bilirubin elevated PALB low   Height: Ht Readings from Last 1 Encounters:  02/11/14 5\' 6"  (1.676 m)    Weight: Wt Readings from Last 1 Encounters:  02/13/14 199 lb 15.3 oz (90.7 kg)    Ideal Body Weight: 130 lbs   % Ideal Body Weight: 153%  Wt Readings from Last 10 Encounters:  02/13/14 199 lb 15.3 oz (90.7 kg)  02/13/14 199 lb 15.3 oz (90.7 kg)  02/06/14 184 lb 1.6 oz (83.507 kg)  01/31/14 188 lb 1.6 oz (85.322 kg)  01/31/14 187 lb (84.823 kg)   01/23/14 183 lb 1.6 oz (83.054 kg)  01/16/14 188 lb 6.4 oz (85.458 kg)  01/02/14 185 lb 4 oz (84.029 kg)  01/02/14 185 lb 4 oz (84.029 kg)  12/22/13 185 lb 12.8 oz (84.278 kg)    Usual Body Weight: 186 lbs per pt  % Usual Body Weight: 107%  BMI:  Body mass index is 32.29 kg/(m^2). Class I obesity  Estimated Nutritional Needs: Kcal: 1500-1700 Protein: 70-85g Fluid: 1.5-1.7L/day  Skin: Non-pitting LLE edema, abdominal incision  Diet Order: NPO  EDUCATION NEEDS: -Education needs addressed, discussed diet therapy for new colostomy, handouts provided with [place] RD contact information    Intake/Output Summary (Last 24 hours) at 02/13/14 1418 Last data filed at 02/13/14 1200  Gross per 24 hour  Intake 1605.58 ml  Output   1525 ml  Net  80.58 ml    Last BM: PTA  Labs:   Recent Labs Lab 02/11/14 0030  02/11/14 1945 02/11/14 2109 02/12/14 0430 02/13/14 0333  NA 138  --  QUESTIONABLE RESULTS, RECOMMEND RECOLLECT TO VERIFY 136* 130* 140  K 3.7  --  QUESTIONABLE RESULTS, RECOMMEND RECOLLECT TO VERIFY 4.1 3.6* 3.7  CL 100  --  QUESTIONABLE RESULTS, RECOMMEND RECOLLECT TO VERIFY 100 97 104  CO2  --   < > QUESTIONABLE RESULTS, RECOMMEND RECOLLECT TO VERIFY 26 23 26   BUN 23  --  QUESTIONABLE RESULTS, RECOMMEND RECOLLECT TO VERIFY 15 11 14   CREATININE 0.80  --  QUESTIONABLE RESULTS, RECOMMEND RECOLLECT TO VERIFY 0.60 0.59 0.61  CALCIUM  --   < > QUESTIONABLE RESULTS,  RECOMMEND RECOLLECT TO VERIFY 7.6* 7.2* 8.1*  MG  --   --  QUESTIONABLE RESULTS, RECOMMEND RECOLLECT TO VERIFY 1.9  --   --   PHOS  --   --  QUESTIONABLE RESULTS, RECOMMEND RECOLLECT TO VERIFY 3.0  --   --   GLUCOSE 109*  --  QUESTIONABLE RESULTS, RECOMMEND RECOLLECT TO VERIFY 165* 390* 114*  < > = values in this interval not displayed.  CBG (last 3)   Recent Labs  02/13/14 0437 02/13/14 0751 02/13/14 1221  GLUCAP 113* 97 102*    Scheduled Meds: . heparin subcutaneous  5,000 Units  Subcutaneous Q8H  . hydrocortisone sodium succinate  50 mg Intravenous Q6H  . insulin aspart  0-15 Units Subcutaneous 6 times per day  . micafungin (MYCAMINE) IV  100 mg Intravenous QHS  . morphine   Intravenous 6 times per day  . pantoprazole (PROTONIX) IV  40 mg Intravenous QHS  . piperacillin-tazobactam (ZOSYN)  IV  3.375 g Intravenous Q8H  . vancomycin  1,000 mg Intravenous Q12H    Continuous Infusions: . sodium chloride 10 mL/hr at 02/13/14 0839  . phenylephrine (NEO-SYNEPHRINE) Adult infusion Stopped (02/13/14 0800)    Past Medical History  Diagnosis Date  . Hypertension   . Allergy   . Wears hearing aid     right  . GERD (gastroesophageal reflux disease)   . Complication of anesthesia     was hard to intubate with thyroid surgery-99  . Difficult intubation 1999    when had thyroid lobectomy  . Dysrhythmia     irregular reuglar heart beat   . Cancer     breast cancer     Past Surgical History  Procedure Laterality Date  . Thyroid lobectomy  1999    right  . Rotator cuff repair  2006    right shoulder  . Breast reduction surgery Bilateral     1980's  . Abdominal hysterectomy  1999  . Tonsillectomy      as child  . Appendectomy      as child  . Colonoscopy    . Partial mastectomy with needle localization and axillary sentinel lymph node bx Right 12/12/2013    Procedure: PARTIAL MASTECTOMY WITH NEEDLE LOCALIZATION AND AXILLARY SENTINEL LYMPH NODE BX;  Surgeon: Earnstine Regal, MD;  Location: [facility];  Service: General;  Laterality: Right;  . Portacath placement Left 01/02/2014    Procedure: INSERTION PORT-A-CATH;  Surgeon: Earnstine Regal, MD;  Location: [facility];  Service: General;  Laterality: Left;    Mikey College MS, [place], [place] Pager [phone] After Hours Pager

## 2014-02-13 NOTE — Progress Notes (Signed)
General Surgery Note  LOS: 3 days  POD -  2 Days Post-Op  Assessment/Plan: 1.  EXPLORATORY LAPAROTOMY, Left COLON RESECTION, COLOSTOMY - 02/11/2014 - D. Newman  For perforation of prox left colon - question diverticular  On Zosyn/Vanc/Micafungin - 5/16 >>> Cont for now given immunosuppression  BP better yesterday - on min Neo synephrine.  Appreciate CCM assistance.  Will plan on weaning to off today   1A.  Wound open   BID dressing changes  2. Right breast cancer, T1, Nx   Invasive ductal ca (metaplastic), 0.8 cm, Grade II-III, ER 0%, PR 0%, Ki-67 - 84%, HER-2/neu negative.   Has had 2 cycles of Docetaxel and Cyclophosphamide - Dr. Katherina Mires Cornetto   Right partial mastectomy - 11/2013 - T. Gerkin   Sees Dr. Pablo Ledger for rad tx. She is interested in getting her rad tx in [place].  3. HTN  4. GERD  5. Left subclavian power port - April 2015 - Gerkin   6.  DVT prophylaxis - SQ Heparin 7.  Anemia  Hgb - 7.8 - 02/12/2014  Combination of blood loss, chemotx, and resuscitation 8.  Foley  Will continue to strictly monitor UOP while on pressors, hopefully can remove later today or tom 9.  Malnutrition  Albumin - 2.0 - 02/11/2014 10.  Steroid coverage   Active Problems:   Perforated sigmoid colon   Septic shock(785.52)  Subjective:  Looks okay.  Husband at bedside.  Pain well controlled.  Objective:   Filed Vitals:   02/13/14 0700  BP: 141/71  Pulse: 66  Temp:   Resp: 18     Intake/Output from previous day:  05/17 0701 - 05/18 0700 In: 2373.4 [I.V.:1603.4; IV Piggyback:650] Out: 8299 [Urine:1750]  Intake/Output this shift:      Physical Exam:   General: WN WF who is alert and oriented.    HEENT: Normal. Pupils equal. .   Lungs: Clear.  .   Abdomen: Soft.  Ostomy in LUQ is pink, no air or stool in bag.   Wound: Clean.   Neurologic:  Grossly intact to motor and sensory function.   Psychiatric: Has normal mood and affect.   Lab Results:     Recent Labs  02/12/14 0430 02/13/14 0333  WBC 6.0 4.9  HGB 7.8* 7.7*  HCT 24.2* 23.1*  PLT 265 247    BMET    Recent Labs  02/12/14 0430 02/13/14 0333  NA 130* 140  K 3.6* 3.7  CL 97 104  CO2 23 26  GLUCOSE 390* 114*  BUN 11 14  CREATININE 0.59 0.61  CALCIUM 7.2* 8.1*    PT/INR  No results found for this basename: LABPROT, INR,  in the last 72 hours  ABG  No results found for this basename: PHART, PCO2, PO2, HCO3,  in the last 72 hours   Studies/Results:  Dg Chest Port 1v Same Day  02/12/2014   CLINICAL DATA:  Sepsis. Recent colon resection. History of breast cancer and hypertension.  EXAM: PORTABLE CHEST - 1 VIEW SAME DAY  COMPARISON:  Yesterday.  FINDINGS: Enlarged cardiac silhouette with an interval increase in size, accentuated by a decreased inspiration. Interval mild patchy opacity at the left lung base. Mild increase in prominence of the interstitial markings, also accentuated by the decreased inspiration. Right breast surgical clips. Stable left subclavian port catheter and right lower neck surgical clips. Thoracic spine degenerative changes.  IMPRESSION: 1. Interval mild patchy opacity at the left lung base, suspicious for pneumonia. 2. Interval  cardiomegaly and probable minimal interstitial pulmonary edema.   Electronically Signed   By: Enrique Sack M.D.   On: 02/12/2014 06:24     Anti-infectives:   Anti-infectives   Start     Dose/Rate Route Frequency Ordered Stop   02/12/14 1000  vancomycin (VANCOCIN) IVPB 1000 mg/200 mL premix     1,000 mg 200 mL/hr over 60 Minutes Intravenous Every 12 hours 02/12/14 0537     02/11/14 2130  vancomycin (VANCOCIN) 1,250 mg in sodium chloride 0.9 % 250 mL IVPB     1,250 mg 166.7 mL/hr over 90 Minutes Intravenous  Once 02/11/14 2117 02/11/14 2310   02/11/14 2030  micafungin (MYCAMINE) 100 mg in sodium chloride 0.9 % 100 mL IVPB     100 mg 100 mL/hr over 1 Hours Intravenous Daily at bedtime 02/11/14 1939     02/11/14 1800   piperacillin-tazobactam (ZOSYN) IVPB 3.375 g     3.375 g 12.5 mL/hr over 240 Minutes Intravenous Every 8 hours 02/11/14 1150     02/11/14 0330  piperacillin-tazobactam (ZOSYN) IVPB 3.375 g  Status:  Discontinued     3.375 g 12.5 mL/hr over 240 Minutes Intravenous  Once 02/11/14 0318 02/11/14 0320   02/11/14 0330  piperacillin-tazobactam (ZOSYN) IVPB 3.375 g     3.375 g 100 mL/hr over 30 Minutes Intravenous  Once 02/11/14 0321 44/96/75 9163      Meghan Tiemann C Louanna Vanliew, MD  [facility]   02/13/2014

## 2014-02-13 NOTE — Progress Notes (Signed)
PULMONARY  / CRITICAL CARE MEDICINE  Name: Martha Evans MRN: [id number] DOB: Mar 29, 1946 PCP [name], MD   ADMISSION DATE:  02/10/2014 LOS 3 days  CONSULTATION DATE:  02/11/14  REFERRING MD :  CCS PRIMARY SERVICE: CCS  CHIEF COMPLAINT:  Hypotension  BRIEF PATIENT DESCRIPTION/HPI: 68 y/o woman with T1Nx R breast CA s/p chemo and R partial mastectomy (3/15) admitted with pneumoperitoneum due to perforated colonic divertculi with surrounding abscess. Underwent emergent laparotomy and colon resection with colostomy placement on 02/11/14. Post-op with persistent hypotension with SBP 70s. CCM called to consult.   On dexamethasone preadmission so she has been started on stress-dose steroids. Micafungin added to zosyn.   Labs back this evening: lactate 0.9. Na 128. Sodium 6.4. Mag 1.6 (likely inaccurate). Neo ordered but not started. Has gotten > 2L IVF. SBP 110 now. Feels better. Mild ab pain. Otherwise no complaints.    LINES / TUBES: Port-a-cath:  CULTURES: Wound culture 5/16 >>> pending Blood cx x 2 5/16 >>> pending UA 5/16 >>> negative  ANTIBIOTICS: Zosyn 5/16 Micafungin 5/16 Vanco 5/16  SIGNIFICANT EVENTS / STUDIES:  Laparotomy with colon resection 5/16  SUBJECTIVE:  Improved, phenylephrine down to 15   VITAL SIGNS: Filed Vitals:   02/13/14 0400 02/13/14 0500 02/13/14 0600 02/13/14 0700  BP: 113/50 117/49 123/60 141/71  Pulse: 51 44 45 66  Temp: 97.4 F (36.3 C)     TempSrc: Oral     Resp: 12 11 14 18   Height:      Weight:      SpO2: 97% 96% 97% 97%   HEMODYNAMICS:   VENTILATOR SETTINGS: Vent Mode:  [-] PRVC FiO2 (%):  [30 %] 30 % Set Rate:  [28 bmp] 28 bmp Vt Set:  [420 mL] 420 mL PEEP:  [5 cmH20] 5 cmH20 Plateau Pressure:  [4 XFG18-29 cmH20] 20 cmH20 INTAKE / OUTPUT: I/O last 3 completed shifts: In: 4004.6 [I.V.:2809.6; Other:120; IV Piggyback:1075] Out: 2950 [Urine:2950]     PHYSICAL EXAMINATION: General:  Lying in bed NAD. On  venti mask HEENT: normal x for alopecia Neck: supple. no JVD. Carotids 2+ bilat; no bruits. No lymphadenopathy or thryomegaly appreciated. Cor: PMI nondisplaced. Regular rate & rhythm. No rubs, gallops or murmurs. Lungs: clear Abdomen: soft, mildly tender. Non-distended. + dressing. LLQ colostomy. Quiet Extremities: no cyanosis, clubbing, rash, edema Neuro: alert & orientedx3, cranial nerves grossly intact. moves all 4 extremities w/o difficulty. Affect pleasant  LABS: PULMONARY  Recent Labs Lab 02/11/14 0030  TCO2 29    CBC  Recent Labs Lab 02/11/14 2109 02/12/14 0430 02/13/14 0333  HGB 8.4* 7.8* 7.7*  HCT 25.5* 24.2* 23.1*  WBC 2.9* 6.0 4.9  PLT 208 265 247    COAGULATION No results found for this basename: INR,  in the last 168 hours  CARDIAC    Recent Labs Lab 02/11/14 1945 02/11/14 2109 02/12/14 0430 02/12/14 1126  [place], RECOMMEND RECOLLECT TO VERIFY <0.30 <0.30 <0.30   No results found for this basename: PROBNP,  in the last 168 hours   CHEMISTRY  Recent Labs Lab 02/06/14 1050  02/11/14 0030 02/11/14 1945 02/11/14 2109 02/12/14 0430 02/13/14 0333  NA 140  --  138 QUESTIONABLE RESULTS, RECOMMEND RECOLLECT TO VERIFY 136* 130* 140  K 3.9  < > 3.7 QUESTIONABLE RESULTS, RECOMMEND RECOLLECT TO VERIFY 4.1 3.6* 3.7  CL  --   --  100 QUESTIONABLE RESULTS, RECOMMEND RECOLLECT TO VERIFY 100 97 104  CO2 25  --   --  QUESTIONABLE RESULTS, RECOMMEND RECOLLECT TO VERIFY 26 23 26   GLUCOSE 108  --  109* QUESTIONABLE RESULTS, RECOMMEND RECOLLECT TO VERIFY 165* 390* 114*  BUN 15.3  --  23 QUESTIONABLE RESULTS, RECOMMEND RECOLLECT TO VERIFY 15 11 14   CREATININE 0.8  --  0.80 QUESTIONABLE RESULTS, RECOMMEND RECOLLECT TO VERIFY 0.60 0.59 0.61  CALCIUM 9.5  --   --  QUESTIONABLE RESULTS, RECOMMEND RECOLLECT TO VERIFY 7.6* 7.2* 8.1*  MG  --   --   --  QUESTIONABLE RESULTS, RECOMMEND RECOLLECT TO VERIFY 1.9  --   --   PHOS  --   --   --   QUESTIONABLE RESULTS, RECOMMEND RECOLLECT TO VERIFY 3.0  --   --   < > = values in this interval not displayed. Estimated Creatinine Clearance: 78.5 ml/min (by C-G formula based on Cr of 0.61).   LIVER  Recent Labs Lab 02/11/14 1945 02/11/14 2109  AST QUESTIONABLE RESULTS, RECOMMEND RECOLLECT TO VERIFY 19  ALT QUESTIONABLE RESULTS, RECOMMEND RECOLLECT TO VERIFY 29  ALKPHOS QUESTIONABLE RESULTS, RECOMMEND RECOLLECT TO VERIFY 96  BILITOT QUESTIONABLE RESULTS, RECOMMEND RECOLLECT TO VERIFY 1.7*  PROT QUESTIONABLE RESULTS, RECOMMEND RECOLLECT TO VERIFY 5.1*  ALBUMIN QUESTIONABLE RESULTS, RECOMMEND RECOLLECT TO VERIFY 2.0*     INFECTIOUS  Recent Labs Lab 02/11/14 2109 02/12/14 0430 02/12/14 1126  LATICACIDVEN 1.3 1.0 0.7     ENDOCRINE CBG (last 3)   Recent Labs  02/13/14 0004 02/13/14 0423 02/13/14 0437  GLUCAP 130* 104* 113*         IMAGING x48h  Dg Chest Port 1v Same Day  02/12/2014   CLINICAL DATA:  Sepsis. Recent colon resection. History of breast cancer and hypertension.  EXAM: PORTABLE CHEST - 1 VIEW SAME DAY  COMPARISON:  Yesterday.  FINDINGS: Enlarged cardiac silhouette with an interval increase in size, accentuated by a decreased inspiration. Interval mild patchy opacity at the left lung base. Mild increase in prominence of the interstitial markings, also accentuated by the decreased inspiration. Right breast surgical clips. Stable left subclavian port catheter and right lower neck surgical clips. Thoracic spine degenerative changes.  IMPRESSION: 1. Interval mild patchy opacity at the left lung base, suspicious for pneumonia. 2. Interval cardiomegaly and probable minimal interstitial pulmonary edema.   Electronically Signed   By: Enrique Sack M.D.   On: 02/12/2014 06:24    ASSESSMENT / PLAN:  PULMONARY A:Acute respiratory failure P: KVO IVF to avoid pulm edema   CARDIOVASCULAR A: Hypotension, post-op. Presumed septic shock.  Presumed sepsis due to  ruptured diverticuli with abscess. Now s/p resection. Cardiomegaly, mild pulm edema pattern. ? Cardiomegaly related to chemo vs pre-existing P:  Covering with Zosyn/vanc and micafungin.   Stress dose steroids started. Wean after pressors off Neo for BP support as needed. Wean as able TTE on 5/18 to eval LV fxn  GASTROINTESTINAL A:  H/o GERD P:  Continue PPI  HEMATOLOGIC A:  Anemia, post-op P: Transfuse as needed for goal Hgb > 7.0  INFECTIOUS A:  Septic shock P:   Cover with Zosyn//Vanco/Micafungin. (vanco to cover possible enterococcus pending cx results)  Wound cx pending.  [place] with IVF and pressors as needed.    ENDOCRINE A:  ?Adrenal insufficiency  P:  On stress dose steroids. Wean when pressors off Cover with SSI.   ONCOLOGY A: H/o breast CA P: s/p right mastectomy, receiving adjuvant chemo H/O to see her today Cystic breast mass partially seen on ab CT. Likely post-op changes. Will need f/u  once stabilized  F/E/N A: Hyponatremia/hyperkalemia/hypomagnesemia P:  Supplement electrolytes as need.   SURGICAL A: Ruptured colonic diverticuli with abscess. S/p laparotomy with colostomy placement 5/16 P:  As per CCS. Continue PCA.  DVT Prophylaxis A: SQ heparin  TODAY'S SUMMARY:     The patient is critically ill with multiple organ systems failure and requires high complexity decision making for assessment and support, frequent evaluation and titration of therapies, application of advanced monitoring technologies and extensive interpretation of multiple databases.   Critical Care Time devoted to patient care services described in this note is  31  Minutes.  Baltazar Apo, MD, PhD 02/13/2014, 8:35 AM [facility] [phone] or if no answer [phone]

## 2014-02-13 NOTE — Progress Notes (Signed)
Echocardiogram 2D Echocardiogram has been performed.  Alyson Locket Rosali Augello 02/13/2014, 3:23 PM

## 2014-02-13 NOTE — Consult Note (Addendum)
WOC ostomy consult note Stoma type/location: LLQ colostomy Stomal assessment/size: 1 and 5/8 inches round, moist, budded.  Os at center. Peristomal assessment: clear, intact Treatment options for stomal/peristomal skin: None indicated Output: scant amount of serosanguinous fluid in pouch  Ostomy pouching: 2pc., 2 and 1/4 inch pouching system   Education provided: Patient, husband, brother and sister-in-law participated in first ostomy teaching session.  Patient admitted despair when first learning of stomy and at end of session reports being encouraged and hopeful  Stoma characteristics, pouch characteristics, diet, return to routine taught.  Patient observed pouch removal, stoma sizing, pouch preparation and application this visit. Patient and husband asking appropriate questions. Educational booklet provided. Patient and family understand role of [place] nurse in the post-operative phase. [facility] nursing team will follow, and will remain available to this patient, the nursing, surgical and medical teams.   Thanks, Maudie Flakes, MSN, RN, [place], [place], [place] [phone])

## 2014-02-14 LAB — GLUCOSE, CAPILLARY
GLUCOSE-CAPILLARY: 101 mg/dL — AB (ref 70–99)
GLUCOSE-CAPILLARY: 101 mg/dL — AB (ref 70–99)
GLUCOSE-CAPILLARY: 80 mg/dL (ref 70–99)
Glucose-Capillary: 103 mg/dL — ABNORMAL HIGH (ref 70–99)
Glucose-Capillary: 110 mg/dL — ABNORMAL HIGH (ref 70–99)
Glucose-Capillary: 118 mg/dL — ABNORMAL HIGH (ref 70–99)
Glucose-Capillary: 89 mg/dL (ref 70–99)

## 2014-02-14 LAB — BASIC METABOLIC PANEL
BUN: 18 mg/dL (ref 6–23)
CALCIUM: 8.4 mg/dL (ref 8.4–10.5)
CO2: 26 mEq/L (ref 19–32)
CREATININE: 0.72 mg/dL (ref 0.50–1.10)
Chloride: 104 mEq/L (ref 96–112)
GFR calc non Af Amer: 88 mL/min — ABNORMAL LOW (ref >90)
GLUCOSE: 109 mg/dL — AB (ref 70–99)
POTASSIUM: 3.3 meq/L — AB (ref 3.7–5.3)
Sodium: 140 mEq/L (ref 137–147)

## 2014-02-14 LAB — CBC
HEMATOCRIT: 22.2 % — AB (ref 36.0–46.0)
Hemoglobin: 7.2 g/dL — ABNORMAL LOW (ref 12.0–15.0)
MCH: 29.3 pg (ref 26.0–34.0)
MCHC: 32.4 g/dL (ref 30.0–36.0)
MCV: 90.2 fL (ref 78.0–100.0)
Platelets: 243 10*3/uL (ref 150–400)
RBC: 2.46 MIL/uL — ABNORMAL LOW (ref 3.87–5.11)
RDW: 13.2 % (ref 11.5–15.5)
WBC: 5.9 10*3/uL (ref 4.0–10.5)

## 2014-02-14 LAB — MAGNESIUM: Magnesium: 2.4 mg/dL (ref 1.5–2.5)

## 2014-02-14 LAB — VANCOMYCIN, TROUGH: VANCOMYCIN TR: 16.2 ug/mL (ref 10.0–20.0)

## 2014-02-14 LAB — PHOSPHORUS: Phosphorus: 2.6 mg/dL (ref 2.3–4.6)

## 2014-02-14 MED ORDER — HYDROCORTISONE SOD SUCCINATE 100 MG IJ SOLR
50.0000 mg | Freq: Two times a day (BID) | INTRAMUSCULAR | Status: DC
  Administered 2014-02-14 – 2014-02-15 (×2): 50 mg via INTRAVENOUS
  Filled 2014-02-14 (×4): qty 1

## 2014-02-14 MED ORDER — POTASSIUM CHLORIDE 10 MEQ/100ML IV SOLN
10.0000 meq | INTRAVENOUS | Status: AC
  Administered 2014-02-14 (×4): 10 meq via INTRAVENOUS
  Filled 2014-02-14 (×3): qty 100

## 2014-02-14 NOTE — Progress Notes (Signed)
CARE MANAGEMENT NOTE 02/14/2014  Patient:  Martha Evans, Martha Evans   Account Number:  [id number]  Date Initiated:  02/14/2014  Documentation initiated by:  Nahia Nissan  Subjective/Objective Assessment:   pod 3, patient transferred to icu due to post op hypotension and required iv neo drip     Action/Plan:   Home when stable/   Anticipated DC Date:  02/17/2014   Anticipated DC Plan:  HOME/SELF CARE  In-house referral  NA      DC Planning Services  NA      [facility] Choice  NA   Choice offered to / List presented to:  NA   DME arranged  NA      DME agency  NA     [place] arranged  NA      [place] agency  NA   Status of service:  In process, will continue to follow Medicare Important Message given?  NA - LOS <3 / Initial given by admissions (If response is "NO", the following Medicare IM given date fields will be blank) Date Medicare IM given:   Date Additional Medicare IM given:    Discharge Disposition:    Per UR Regulation:  Reviewed for med. necessity/level of care/duration of stay  If discussed at [place] of Stay Meetings, dates discussed:    Comments:  [id number] Eldridge Dace, BSN, [place] [phone] Chart Reviewed for discharge and hospital needs. Discharge needs at time of review: None present will follow for needs. Review of patient progress due on [id number].

## 2014-02-14 NOTE — Progress Notes (Signed)
[facility] ICU Electrolyte Replacement Protocol  Patient Name: Martha Evans DOB: [birth year] MRN: [id number]  Date of Service  02/14/2014   HPI/Events of Note    Recent Labs Lab 02/11/14 0030 02/11/14 1945 02/11/14 2109 02/12/14 0430 02/13/14 0333 02/14/14 0317  NA 138 QUESTIONABLE RESULTS, RECOMMEND RECOLLECT TO VERIFY 136* 130* 140 140  K 3.7 QUESTIONABLE RESULTS, RECOMMEND RECOLLECT TO VERIFY 4.1 3.6* 3.7 3.3*  CL 100 QUESTIONABLE RESULTS, RECOMMEND RECOLLECT TO VERIFY 100 97 104 104  CO2  --  QUESTIONABLE RESULTS, RECOMMEND RECOLLECT TO VERIFY 26 23 26 26   GLUCOSE 109* QUESTIONABLE RESULTS, RECOMMEND RECOLLECT TO VERIFY 165* 390* 114* 109*  BUN 23 QUESTIONABLE RESULTS, RECOMMEND RECOLLECT TO VERIFY 15 11 14 18   CREATININE 0.80 QUESTIONABLE RESULTS, RECOMMEND RECOLLECT TO VERIFY 0.60 0.59 0.61 0.72  CALCIUM  --  QUESTIONABLE RESULTS, RECOMMEND RECOLLECT TO VERIFY 7.6* 7.2* 8.1* 8.4  MG  --  QUESTIONABLE RESULTS, RECOMMEND RECOLLECT TO VERIFY 1.9  --   --  2.4  PHOS  --  QUESTIONABLE RESULTS, RECOMMEND RECOLLECT TO VERIFY 3.0  --   --  2.6    Estimated Creatinine Clearance: 77.2 ml/min (by C-G formula based on Cr of 0.72).  Intake/Output     05/18 0701 - 05/19 0700   I.V. (mL/kg) 251.3 (2.9)   IV Piggyback 650   Total Intake(mL/kg) 901.3 (10.3)   Urine (mL/kg/hr) 725 (0.3)   Total Output 725   Net +176.3        - I/O DETAILED x24h    Total I/O In: 511.3 [I.V.:161.3; IV Piggyback:350] Out: 225 [Urine:225] - I/O THIS SHIFT    ASSESSMENT   eICURN Interventions  K + 3.3 ICU Electrolyte Replacement Protocol criteria met.Labs Replaced per protocol.MD notified    ASSESSMENT: MAJOR ELECTROLYTE    Lorene Dy 02/14/2014, 6:12 AM

## 2014-02-14 NOTE — Progress Notes (Signed)
PHARMACY - VANCOMYCIN  Day #4 Vancomycin (see full note written earlier today by Hershal Coria, PharmD)  Vancomycin trough = 16.2 mcg/ml (goal 15-20 mcg/ml)  PLAN:  Continue Vancomycin 1gm IV q12h              [place] to follow renal functions, cultures/sensitivities  Leone Haven, PharmD 02/14/14 @ 21:54

## 2014-02-14 NOTE — Progress Notes (Signed)
PULMONARY  / CRITICAL CARE MEDICINE  Name: Martha Evans MRN: [id number] DOB: Mar 23, 1946 PCP [name], MD   ADMISSION DATE:  02/10/2014 CONSULTATION DATE:  02/11/14  REFERRING MD :  CCS  CHIEF COMPLAINT:  Hypotension  BRIEF PATIENT DESCRIPTION/HPI:  68 yo female admitted with abdominal pain and pneumoperitoneum from perforated colon diverticulum with surrounding abscess.  Hypotension post-op and PCCM consulted.  Hx of T1Nx Rt breast CA s/p chemo and R partial mastectomy (3/15) on decadron as outpt.  LINES / TUBES: Port-a-cath:  CULTURES: Wound culture 5/16 >>> Blood cx x 2 5/16 >>>   ANTIBIOTICS: Zosyn 5/16>>> Micafungin 5/16>>> Vanco 5/16>>>  SIGNIFICANT EVENTS: 5/16 laparotomy with colon resection 5/18 Off pressors  STUDIES:  5/18 ECHO: EF 65% nml LV fxn   SUBJECTIVE:  Feels better.  Abd pain controlled.  Denies chest pain/dyspnea.  VITAL SIGNS: Temp:  [97.8 F (36.6 C)-98.8 F (37.1 C)] 97.8 F (36.6 C) (05/19 0800) Pulse Rate:  [45-72] 54 (05/19 1000) Resp:  [12-26] 25 (05/19 1000) BP: (89-124)/(35-92) 124/65 mmHg (05/19 1000) SpO2:  [92 %-99 %] 97 % (05/19 1000) Weight:  [193 lb 9 oz (87.8 kg)] 193 lb 9 oz (87.8 kg) (05/19 0400) INTAKE / OUTPUT: I/O last 3 completed shifts: In: 1864.4 [I.V.:864.4; IV Piggyback:1000] Out: 1800 [Urine:1800]    PHYSICAL EXAMINATION: General: no distress HEENT: no sinus tenderness Cor: regular Lungs: no wheeze Abdomen: soft, colostomy LLQ  Extremities: no edema Neuro: normal strength  LABS: CBC Recent Labs     02/12/14  0430  02/13/14  0333  02/14/14  0317  WBC  6.0  4.9  5.9  HGB  7.8*  7.7*  7.2*  HCT  24.2*  23.1*  22.2*  PLT  265  247  243   BMET Recent Labs     02/12/14  0430  02/13/14  0333  02/14/14  0317  NA  130*  140  140  K  3.6*  3.7  3.3*  CL  97  104  104  CO2  23  26  26   BUN  11  14  18   CREATININE  0.59  0.61  0.72  GLUCOSE  390*  114*  109*   Cardiac  Enzymes Recent Labs     02/11/14  2109  02/12/14  0430  02/12/14  1126  TROPONINI  <0.30  <0.30  <0.30    Glucose Recent Labs     02/13/14  1221  02/13/14  1647  02/13/14  1956  02/13/14  2337  02/14/14  0355  02/14/14  0736  GLUCAP  102*  95  110*  118*  101*  101*    Imaging No results found.    ASSESSMENT: A: Septic shock from diverticular abscess >> resolved. Hx of HTN. P:  Even fluid balance Hold outpt micardis for now  A: Perforated diverticulum with peritonitis s/p laparotomy. Hx of GERD. P: Post-op care, nutrition, pain control per CCS Continue zosyn, micafungin, vancomycin pending cx results  A: Anemia of critical illness and chronic disease. Hx of Breast cancer. P: F/u CBC Transfuse for Hb < 7 F/u with oncology as outpt  A: Relative adrenal insufficiency >> on decadron as outpt as part of chemo regimen. P: Wean of solu cortef as tolerated over next 2 days  PCCM will sign off.  If additional medical assistance needed, then recommend consulting [facility].  Chesley Mires, MD [facility] Pulmonary/Critical Care 02/14/2014, 10:47 AM Pager:  [phone] After 3pm call: [phone]

## 2014-02-14 NOTE — Progress Notes (Signed)
ANTIBIOTIC CONSULT NOTE - FOLLOW UP  Pharmacy Consult for Vancomycin Indication: rule out sepsis  Allergies  Allergen Reactions  . Codeine Sulfate Nausea And Vomiting  . Tramadol Nausea Only    Nausea     Patient Measurements: Height: 5\' 6"  (167.6 cm) Weight: 193 lb 9 oz (87.8 kg) IBW/kg (Calculated) : 59.3  Vital Signs: Temp: 97.8 F (36.6 C) (05/19 0800) Temp src: Oral (05/19 0800) BP: 124/65 mmHg (05/19 1000) Pulse Rate: 54 (05/19 1000) Intake/Output from previous day: 05/18 0701 - 05/19 0700 In: 911.3 [I.V.:261.3; IV Piggyback:650] Out: 1200 [Urine:1200] Intake/Output from this shift: Total I/O In: 10 [I.V.:10] Out: -   Labs:  Recent Labs  02/12/14 0430 02/13/14 0333 02/14/14 0317  WBC 6.0 4.9 5.9  HGB 7.8* 7.7* 7.2*  PLT 265 247 243  CREATININE 0.59 0.61 0.72   Estimated Creatinine Clearance: 77.2 ml/min (by C-G formula based on Cr of 0.72). No results found for this basename: VANCOTROUGH, Corlis Leak, VANCORANDOM, GENTTROUGH, GENTPEAK, GENTRANDOM, TOBRATROUGH, TOBRAPEAK, TOBRARND, AMIKACINPEAK, AMIKACINTROU, AMIKACIN,  in the last 72 hours   Microbiology: Recent Results (from the past 720 hour(s))  ANAEROBIC CULTURE     Status: None   Collection Time    02/11/14  8:19 AM      Result Value Ref Range Status   Specimen Description ABSCESS LEFT COLON   Final   Special Requests NONE   Final   Gram Stain     Final   Value: ABUNDANT WBC PRESENT,BOTH PMN AND MONONUCLEAR     NO SQUAMOUS EPITHELIAL CELLS SEEN     MODERATE GRAM POSITIVE COCCI     IN PAIRS     Performed at [facility]   Culture     Final   Value: NO ANAEROBES ISOLATED; CULTURE IN PROGRESS FOR 5 DAYS     Performed at [facility]   Report Status PENDING   Incomplete  CULTURE, ROUTINE-ABSCESS     Status: None   Collection Time    02/11/14  8:19 AM      Result Value Ref Range Status   Specimen Description ABSCESS LEFT COLON   Final   Special Requests NONE   Final   Gram  Stain     Final   Value: ABUNDANT WBC PRESENT, PREDOMINANTLY PMN     NO SQUAMOUS EPITHELIAL CELLS SEEN     MODERATE GRAM POSITIVE COCCI     IN PAIRS     Performed at [facility]   Culture     Final   Value: MULTIPLE ORGANISMS PRESENT, NONE PREDOMINANT     Performed at [facility]   Report Status PENDING   Incomplete  CULTURE, BLOOD (ROUTINE X 2)     Status: None   Collection Time    02/11/14  9:10 PM      Result Value Ref Range Status   Specimen Description BLOOD LEFT FOREARM   Final   Special Requests BOTTLES DRAWN AEROBIC ONLY 3CC   Final   Culture  Setup Time     Final   Value: 02/12/2014 03:02     Performed at [facility]   Culture     Final   Value:        BLOOD CULTURE RECEIVED NO GROWTH TO DATE CULTURE WILL BE HELD FOR 5 DAYS BEFORE ISSUING A FINAL NEGATIVE REPORT     Performed at [facility]   Report Status PENDING   Incomplete  CULTURE, BLOOD (ROUTINE X 2)  Status: None   Collection Time    02/11/14  9:15 PM      Result Value Ref Range Status   Specimen Description BLOOD RIGHT HAND   Final   Special Requests BOTTLES DRAWN AEROBIC ONLY 3CC   Final   Culture  Setup Time     Final   Value: 02/12/2014 03:02     Performed at [facility]   Culture     Final   Value:        BLOOD CULTURE RECEIVED NO GROWTH TO DATE CULTURE WILL BE HELD FOR 5 DAYS BEFORE ISSUING A FINAL NEGATIVE REPORT     Performed at [facility]   Report Status PENDING   Incomplete    Assessment: 68 y/o woman with T1Nx R breast CA s/p chemo and R partial mastectomy (3/15) admitted with pneumoperitoneum due to perforated colonic divertculi with surrounding abscess and antibiotics were started.  Underwent emergent laparotomy and colon resection with colostomy placement on 02/11/14.  5/16 >> Zosyn >> 5/16 >> Micafungin >> 5/16 >> Vanc >>  Tmax: AF WBCs: wnl (note neulasta given 5/12, on steroids) Renal: SCr 0.72.  CrCl ~98 ml/min (CG), ~77 ml/min  (normalized) using adjusted SCr 0.8.  5/16 blood x2: ngtd 5/16 UA: neg 5/16 abscess: reincubated, no anaerobes isolated (IP)  5/19: D4 Vanc 1250 x1 then 1g q12h, Zosyn 3.375g IV q8h (4 hour infusion time), Micafungin 100mg  q24h for sepsis.  Surgery wants to continue antibiotics given immunocompromised state.  Goal of Therapy:  Vancomycin trough level 15-20 mcg/ml  Plan:  Check vancomycin trough level tonight at 2100.  Will adjust dose based on level.  Donald Prose Yuli Lanigan 02/14/2014,10:57 AM

## 2014-02-14 NOTE — Consult Note (Signed)
Martha Evans   DOB:1946-04-17   XL#:[id number]   ZDG#:[id number]  Subjective: Patient is a 68 year old Martha Evans woman who was admitted over the weekend due to a perforated sigmoid colon.  She underwent a colon resection with colostomy placement and is on IV antibiotics.  Her wound is open.  She is doing moderately well today.  She tells me her pain is under control.  She is wondering what the plan is for future breast cancer treatment.  She tells me that her wound is doing well and that she does feel improved today.     Objective:  Filed Vitals:   02/14/14 1600  BP:   Pulse:   Temp:   Resp: 20    Body mass index is 31.26 kg/(m^2).  Intake/Output Summary (Last 24 hours) at 02/14/14 1628 Last data filed at 02/14/14 1423  Gross per 24 hour  Intake  601.3 ml  Output   1175 ml  Net -573.7 ml     Sclerae unicteric  Oropharynx clear  No peripheral adenopathy  Lungs clear -- no rales or rhonchi  Heart regular rate and rhythm  Abdomen covered in abd pads, dressing is clean dry  and intact.  Colostomy in place, stoma pink  MSK no focal spinal tenderness, no peripheral edema  Neuro nonfocal   CBG (last 3)   Recent Labs  02/13/14 2337 02/14/14 0355 02/14/14 0736  GLUCAP 118* 101* 101*     Labs:  Lab Results  Component Value Date   WBC 5.9 02/14/2014   HGB 7.2* 02/14/2014   HCT 22.2* 02/14/2014   MCV 90.2 02/14/2014   PLT 243 02/14/2014   NEUTROABS 3.5 02/13/2014    Urine Studies No results found for this basename: UACOL, UAPR, USPG, UPH, UTP, UGL, UKET, UBIL, UHGB, UNIT, UROB, ULEU, UEPI, UWBC, URBC, UBAC, CAST, CRYS, UCOM, BILUA,  in the last 72 hours  Basic Metabolic Panel:  Recent Labs Lab 02/11/14 0030 02/11/14 1945 02/11/14 2109 02/12/14 0430 02/13/14 0333 02/14/14 0317  NA 138 QUESTIONABLE RESULTS, RECOMMEND RECOLLECT TO VERIFY 136* 130* 140 140  K 3.7 QUESTIONABLE RESULTS, RECOMMEND RECOLLECT TO VERIFY 4.1 3.6* 3.7 3.3*  CL 100 QUESTIONABLE RESULTS,  RECOMMEND RECOLLECT TO VERIFY 100 97 104 104  CO2  --  QUESTIONABLE RESULTS, RECOMMEND RECOLLECT TO VERIFY 26 23 26 26   GLUCOSE 109* QUESTIONABLE RESULTS, RECOMMEND RECOLLECT TO VERIFY 165* 390* 114* 109*  BUN 23 QUESTIONABLE RESULTS, RECOMMEND RECOLLECT TO VERIFY 15 11 14 18   CREATININE 0.80 QUESTIONABLE RESULTS, RECOMMEND RECOLLECT TO VERIFY 0.60 0.59 0.61 0.72  CALCIUM  --  QUESTIONABLE RESULTS, RECOMMEND RECOLLECT TO VERIFY 7.6* 7.2* 8.1* 8.4  MG  --  QUESTIONABLE RESULTS, RECOMMEND RECOLLECT TO VERIFY 1.9  --   --  2.4  PHOS  --  QUESTIONABLE RESULTS, RECOMMEND RECOLLECT TO VERIFY 3.0  --   --  2.6   GFR Estimated Creatinine Clearance: 77.2 ml/min (by C-G formula based on Cr of 0.72). Liver Function Tests:  Recent Labs Lab 02/11/14 1945 02/11/14 2109  AST QUESTIONABLE RESULTS, RECOMMEND RECOLLECT TO VERIFY 19  ALT QUESTIONABLE RESULTS, RECOMMEND RECOLLECT TO VERIFY 29  ALKPHOS QUESTIONABLE RESULTS, RECOMMEND RECOLLECT TO VERIFY 96  BILITOT QUESTIONABLE RESULTS, RECOMMEND RECOLLECT TO VERIFY 1.7*  PROT QUESTIONABLE RESULTS, RECOMMEND RECOLLECT TO VERIFY 5.1*  ALBUMIN QUESTIONABLE RESULTS, RECOMMEND RECOLLECT TO VERIFY 2.0*   No results found for this basename: LIPASE, AMYLASE,  in the last 168 hours No results found for this basename: AMMONIA,  in the  last 168 hours Coagulation profile No results found for this basename: INR, PROTIME,  in the last 168 hours  CBC:  Recent Labs Lab 02/11/14 0022  02/11/14 1945 02/11/14 2109 02/12/14 0430 02/13/14 0333 02/14/14 0317  WBC 9.2  --  3.0* 2.9* 6.0 4.9 5.9  NEUTROABS 8.1*  --   --   --   --  3.5  --   HGB 10.2*  < > 7.6* 8.4* 7.8* 7.7* 7.2*  HCT 31.0*  < > 23.4* 25.5* 24.2* 23.1* 22.2*  MCV 89.1  --  93.2 90.4 91.3 90.2 90.2  PLT 255  --  171 208 265 247 243  < > = values in this interval not displayed. Cardiac Enzymes:  Recent Labs Lab 02/11/14 1945 02/11/14 2109 02/12/14 0430 02/12/14 1126  TROPONINI QUESTIONABLE  RESULTS, RECOMMEND RECOLLECT TO VERIFY <0.30 <0.30 <0.30   BNP: No components found with this basename: POCBNP,  CBG:  Recent Labs Lab 02/13/14 1647 02/13/14 1956 02/13/14 2337 02/14/14 0355 02/14/14 0736  GLUCAP 95 110* 118* 101* 101*   D-Dimer No results found for this basename: DDIMER,  in the last 72 hours Hgb A1c No results found for this basename: HGBA1C,  in the last 72 hours Lipid Profile No results found for this basename: CHOL, HDL, LDLCALC, TRIG, CHOLHDL, LDLDIRECT,  in the last 72 hours Thyroid function studies No results found for this basename: TSH, T4TOTAL, FREET3, T3FREE, THYROIDAB,  in the last 72 hours Anemia work up No results found for this basename: VITAMINB12, FOLATE, FERRITIN, TIBC, IRON, RETICCTPCT,  in the last 72 hours Microbiology Recent Results (from the past 240 hour(s))  ANAEROBIC CULTURE     Status: None   Collection Time    02/11/14  8:19 AM      Result Value Ref Range Status   Specimen Description ABSCESS LEFT COLON   Final   Special Requests NONE   Final   Gram Stain     Final   Value: ABUNDANT WBC PRESENT,BOTH PMN AND MONONUCLEAR     NO SQUAMOUS EPITHELIAL CELLS SEEN     MODERATE GRAM POSITIVE COCCI     IN PAIRS     Performed at [facility]   Culture     Final   Value: NO ANAEROBES ISOLATED; CULTURE IN PROGRESS FOR 5 DAYS     Performed at [facility]   Report Status PENDING   Incomplete  CULTURE, ROUTINE-ABSCESS     Status: None   Collection Time    02/11/14  8:19 AM      Result Value Ref Range Status   Specimen Description ABSCESS LEFT COLON   Final   Special Requests NONE   Final   Gram Stain     Final   Value: ABUNDANT WBC PRESENT, PREDOMINANTLY PMN     NO SQUAMOUS EPITHELIAL CELLS SEEN     MODERATE GRAM POSITIVE COCCI     IN PAIRS     Performed at [facility]   Culture     Final   Value: MULTIPLE ORGANISMS PRESENT, NONE PREDOMINANT     Performed at [facility]   Report Status PENDING    Incomplete  CULTURE, BLOOD (ROUTINE X 2)     Status: None   Collection Time    02/11/14  9:10 PM      Result Value Ref Range Status   Specimen Description BLOOD LEFT FOREARM   Final   Special Requests BOTTLES DRAWN AEROBIC ONLY 3CC  Final   Culture  Setup Time     Final   Value: 02/12/2014 03:02     Performed at [facility]   Culture     Final   Value:        BLOOD CULTURE RECEIVED NO GROWTH TO DATE CULTURE WILL BE HELD FOR 5 DAYS BEFORE ISSUING A FINAL NEGATIVE REPORT     Performed at [facility]   Report Status PENDING   Incomplete  CULTURE, BLOOD (ROUTINE X 2)     Status: None   Collection Time    02/11/14  9:15 PM      Result Value Ref Range Status   Specimen Description BLOOD RIGHT HAND   Final   Special Requests BOTTLES DRAWN AEROBIC ONLY 3CC   Final   Culture  Setup Time     Final   Value: 02/12/2014 03:02     Performed at [facility]   Culture     Final   Value:        BLOOD CULTURE RECEIVED NO GROWTH TO DATE CULTURE WILL BE HELD FOR 5 DAYS BEFORE ISSUING A FINAL NEGATIVE REPORT     Performed at [facility]   Report Status PENDING   Incomplete      Studies:  No results found.  Assessment/Plan: 68 y.o. [place], [place] woman with  1. 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  The patient has received 2 cycles of adjuvant Docetaxel and Cyclophosphamide.  Due to her recent complications with a bowel perforation, she will not receive any further chemotherapy at this point.  I spent a long time discussing her breast cancer and the purpose of chemotherapy, and that she really needed to recover from her complications.    2. Bowel perforation:  Being managed by surgery.  Her wounds would need to be completely healed if she were to receive chemotherapy again, however it would be a treatment that did not require premedication with steroids.  What the patient really needs is to recover from this and rest,  regain her strength and see [place] back in clinic to discuss future treatment and possibilities.  We will give her a long break and see her back in clinic after she is discharged.   Other medical problems as per the primary team.    The patient will follow up with [place] after discharge and we can discuss this with her further.    Minette Headland, [place] [phone]  02/14/2014

## 2014-02-14 NOTE — Progress Notes (Signed)
Patient is a 68 year old female who was present with her husband of 1 year, though the relationship goes back about 12 years or more.  She came in Friday.  She is a very pleasant and positive person, both of them being unattached to any specific denomination or congregation.  The visit was suggested by the staff, likely based on what they have been through.  She related that it was close on Saturday.  She had an abscess break in her colon area and flooded her with the resulting toxins, and she is still on anti-biotic treatment.  However, at this juncture, the patient relates that she may be moved to the fifth floor.  She will likely not be resuming her chemo for a while, but she speaks of possibly getting radiation.   For the time spent (a bit over an hour) we spoke of many things, their relationship, religious influences and background, old friends, parents, personal situations, diversity issues and staying positive along with what power and sustenance that can bring.  The husband requested this Chaplain return to visit when he is back in the hospital, and the Chaplain said he will find her in "the files" and visit accordingly.  Loann Quill, Chaplain Pager: [phone]

## 2014-02-14 NOTE — Progress Notes (Signed)
General Surgery Note  LOS: 4 days  POD -  3 Days Post-Op  Assessment/Plan: 1.  EXPLORATORY LAPAROTOMY, Left COLON RESECTION, COLOSTOMY - 02/11/2014 - D. Newman  For perforation of prox left colon - question diverticular  On Zosyn/Vanc/Micafungin - 5/16 >>> Cont for now given immunosuppression  BP still a bit soft, off Neo.  Appreciate CCM assistance.  Will monitor today.    1A.  Wound open   BID dressing changes  2. Right breast cancer, T1, Nx   Invasive ductal ca (metaplastic), 0.8 cm, Grade II-III, ER 0%, PR 0%, Ki-67 - 84%, HER-2/neu negative.   Has had 2 cycles of Docetaxel and Cyclophosphamide - Dr. Katherina Mires Cornetto   Right partial mastectomy - 11/2013 - T. Gerkin   Sees Dr. Pablo Ledger for rad tx. She is interested in getting her rad tx in [place].  3. HTN  4. GERD  5. Left subclavian power port - April 2015 - Gerkin   6.  DVT prophylaxis - SQ Heparin 7.  Anemia  Hgb - 7.8 - 02/12/2014  Combination of blood loss, chemotx, and resuscitation 8.  Foley  Will continue to strictly monitor UOP while on pressors, hopefully can remove later today or tom 9.  Malnutrition  Albumin - 2.0 - 02/11/2014 10.  Steroid coverage   Active Problems:   Perforated sigmoid colon   Septic shock(785.52)  Subjective:  Looks okay.  Up in a chair.  Foley out.  Husband at bedside.  Pain well controlled.  Objective:   Filed Vitals:   02/14/14 0700  BP: 110/40  Pulse: 50  Temp:   Resp: 16     Intake/Output from previous day:  05/18 0701 - 05/19 0700 In: 911.3 [I.V.:261.3; IV Piggyback:650] Out: 1200 [Urine:1200]  Intake/Output this shift:      Physical Exam:   General: WN WF who is alert and oriented.    HEENT: Normal. Pupils equal. .   Lungs: Clear.  .   Abdomen: Soft.  Ostomy in LUQ is pink, no air or stool in bag.   Wound: Clean.   Neurologic:  Grossly intact to motor and sensory function.   Psychiatric: Has normal mood and affect.   Lab Results:     Recent Labs  02/13/14 0333 02/14/14 0317  WBC 4.9 5.9  HGB 7.7* 7.2*  HCT 23.1* 22.2*  PLT 247 243    BMET    Recent Labs  02/13/14 0333 02/14/14 0317  NA 140 140  K 3.7 3.3*  CL 104 104  CO2 26 26  GLUCOSE 114* 109*  BUN 14 18  CREATININE 0.61 0.72  CALCIUM 8.1* 8.4    PT/INR  No results found for this basename: LABPROT, INR,  in the last 72 hours  ABG  No results found for this basename: PHART, PCO2, PO2, HCO3,  in the last 72 hours   Studies/Results:  No results found.   Anti-infectives:   Anti-infectives   Start     Dose/Rate Route Frequency Ordered Stop   02/12/14 1000  vancomycin (VANCOCIN) IVPB 1000 mg/200 mL premix     1,000 mg 200 mL/hr over 60 Minutes Intravenous Every 12 hours 02/12/14 0537     02/11/14 2130  vancomycin (VANCOCIN) 1,250 mg in sodium chloride 0.9 % 250 mL IVPB     1,250 mg 166.7 mL/hr over 90 Minutes Intravenous  Once 02/11/14 2117 02/11/14 2310   02/11/14 2030  micafungin (MYCAMINE) 100 mg in sodium chloride 0.9 % 100 mL IVPB  100 mg 100 mL/hr over 1 Hours Intravenous Daily at bedtime 02/11/14 1939     02/11/14 1800  piperacillin-tazobactam (ZOSYN) IVPB 3.375 g     3.375 g 12.5 mL/hr over 240 Minutes Intravenous Every 8 hours 02/11/14 1150     02/11/14 0330  piperacillin-tazobactam (ZOSYN) IVPB 3.375 g  Status:  Discontinued     3.375 g 12.5 mL/hr over 240 Minutes Intravenous  Once 02/11/14 0318 02/11/14 0320   02/11/14 0330  piperacillin-tazobactam (ZOSYN) IVPB 3.375 g     3.375 g 100 mL/hr over 30 Minutes Intravenous  Once 02/11/14 0321 88/71/95 [id number]      Bemnet Trovato C Aleynah Rocchio, MD  Colorectal and [facility] Surgery   02/14/2014

## 2014-02-15 LAB — GLUCOSE, CAPILLARY
GLUCOSE-CAPILLARY: 128 mg/dL — AB (ref 70–99)
Glucose-Capillary: 97 mg/dL (ref 70–99)
Glucose-Capillary: 87 mg/dL (ref 70–99)
Glucose-Capillary: 80 mg/dL (ref 70–99)
Glucose-Capillary: 108 mg/dL — ABNORMAL HIGH (ref 70–99)
Glucose-Capillary: 129 mg/dL — ABNORMAL HIGH (ref 70–99)
Glucose-Capillary: 117 mg/dL — ABNORMAL HIGH (ref 70–99)

## 2014-02-15 LAB — COMPREHENSIVE METABOLIC PANEL
ALT: 21 U/L (ref 0–35)
AST: 19 U/L (ref 0–37)
Albumin: 2.2 g/dL — ABNORMAL LOW (ref 3.5–5.2)
Alkaline Phosphatase: 107 U/L (ref 39–117)
BUN: 22 mg/dL (ref 6–23)
CALCIUM: 8.7 mg/dL (ref 8.4–10.5)
CO2: 30 meq/L (ref 19–32)
CREATININE: 1.05 mg/dL (ref 0.50–1.10)
Chloride: 105 mEq/L (ref 96–112)
GFR calc Af Amer: 63 mL/min — ABNORMAL LOW (ref >90)
GFR calc non Af Amer: 54 mL/min — ABNORMAL LOW (ref >90)
Glucose, Bld: 87 mg/dL (ref 70–99)
Potassium: 3.2 mEq/L — ABNORMAL LOW (ref 3.7–5.3)
Sodium: 144 mEq/L (ref 137–147)
Total Bilirubin: 0.6 mg/dL (ref 0.3–1.2)
Total Protein: 5.5 g/dL — ABNORMAL LOW (ref 6.0–8.3)

## 2014-02-15 LAB — CULTURE, ROUTINE-ABSCESS

## 2014-02-15 LAB — CBC
HEMATOCRIT: 27 % — AB (ref 36.0–46.0)
Hemoglobin: 8.6 g/dL — ABNORMAL LOW (ref 12.0–15.0)
MCH: 28.8 pg (ref 26.0–34.0)
MCHC: 31.9 g/dL (ref 30.0–36.0)
MCV: 90.3 fL (ref 78.0–100.0)
PLATELETS: 332 10*3/uL (ref 150–400)
RBC: 2.99 MIL/uL — AB (ref 3.87–5.11)
RDW: 13.2 % (ref 11.5–15.5)
WBC: 11.6 10*3/uL — AB (ref 4.0–10.5)

## 2014-02-15 MED ORDER — MAGNESIUM SULFATE 40 MG/ML IJ SOLN: 2.0000 g | Freq: Once | INTRAMUSCULAR | Status: DC

## 2014-02-15 MED ORDER — INSULIN ASPART 100 UNIT/ML ~~LOC~~ SOLN
0.0000 [IU] | Freq: Three times a day (TID) | SUBCUTANEOUS | Status: DC
  Administered 2014-02-16 – 2014-02-17 (×2): 3 [IU] via SUBCUTANEOUS
  Administered 2014-02-18 – 2014-02-19 (×3): 2 [IU] via SUBCUTANEOUS

## 2014-02-15 MED ORDER — POTASSIUM CHLORIDE 10 MEQ/100ML IV SOLN
10.0000 meq | INTRAVENOUS | Status: AC
  Administered 2014-02-15 (×4): 10 meq via INTRAVENOUS
  Filled 2014-02-15 (×5): qty 100

## 2014-02-15 MED ORDER — HYDROCORTISONE NA SUCCINATE PF 100 MG IJ SOLR
25.0000 mg | Freq: Every day | INTRAMUSCULAR | Status: AC
  Administered 2014-02-17: 25 mg via INTRAVENOUS
  Filled 2014-02-15: qty 0.5

## 2014-02-15 MED ORDER — HYDROCORTISONE NA SUCCINATE PF 100 MG IJ SOLR
12.5000 mg | Freq: Every day | INTRAMUSCULAR | Status: AC
  Administered 2014-02-18: 12.5 mg via INTRAVENOUS
  Filled 2014-02-15: qty 0.25

## 2014-02-15 MED ORDER — POTASSIUM CHLORIDE 10 MEQ/50ML IV SOLN: 10.0000 meq | INTRAVENOUS | Status: DC

## 2014-02-15 MED ORDER — HYDROCORTISONE NA SUCCINATE PF 100 MG IJ SOLR
50.0000 mg | Freq: Every day | INTRAMUSCULAR | Status: AC
  Administered 2014-02-16: 50 mg via INTRAVENOUS
  Filled 2014-02-15: qty 1

## 2014-02-15 NOTE — Progress Notes (Signed)
4 Days Post-Op  Subjective: She is comfortable in bed.  She has a wound vac on.  She got up and walked some yesterday.  Nothing in ostomy bag aside from some gas.  Over all she looks comfortable and recovering well.   Objective: Vital signs in last 24 hours: Temp:  [97.8 F (36.6 C)-98.4 F (36.9 C)] 98 F (36.7 C) (05/20 0000) Pulse Rate:  [35-68] 64 (05/20 0630) Resp:  [15-25] 18 (05/20 0630) BP: (107-134)/(40-92) 114/61 mmHg (05/20 0630) SpO2:  [94 %-100 %] 95 % (05/20 0630) FiO2 (%):  [95 %] 95 % (05/20 0400)  NPO Started on Zosyn PM 02/10/14 - Starting day 6 (multiple organisms on culture) NG on blood cultures Afebrile, VSS K+ 3.2 WBC 11.6 Intake/Output from previous day: 05/19 0701 - 05/20 0700 In: 1242.5 [I.V.:180; IV Piggyback:1062.5] Out: 1350 [Urine:1350] Intake/Output this shift:    General appearance: alert, cooperative and no distress Resp: clear to auscultation bilaterally Cardio: regular rate and rhythm, S1, S2 normal, no murmur, click, rub or gallop GI: +BS, soft, sore, says she doesn't use PCA much.  Wound vac in place, some gas on ostomy bag.  Lab Results:   Recent Labs  02/14/14 0317 02/15/14 0329  WBC 5.9 11.6*  HGB 7.2* 8.6*  HCT 22.2* 27.0*  PLT 243 332    BMET  Recent Labs  02/14/14 0317 02/15/14 0329  NA 140 144  K 3.3* 3.2*  CL 104 105  CO2 26 30  GLUCOSE 109* 87  BUN 18 22  CREATININE 0.72 1.05  CALCIUM 8.4 8.7   PT/INR No results found for this basename: LABPROT, INR,  in the last 72 hours   Recent Labs Lab 02/11/14 1945 02/11/14 2109 02/15/14 0329  AST QUESTIONABLE RESULTS, RECOMMEND RECOLLECT TO VERIFY 19 19  ALT QUESTIONABLE RESULTS, RECOMMEND RECOLLECT TO VERIFY 29 21  ALKPHOS QUESTIONABLE RESULTS, RECOMMEND RECOLLECT TO VERIFY 96 107  BILITOT QUESTIONABLE RESULTS, RECOMMEND RECOLLECT TO VERIFY 1.7* 0.6  PROT QUESTIONABLE RESULTS, RECOMMEND RECOLLECT TO VERIFY 5.1* 5.5*  ALBUMIN QUESTIONABLE RESULTS, RECOMMEND  RECOLLECT TO VERIFY 2.0* 2.2*     Lipase  No results found for this basename: lipase     Studies/Results: No results found.  Medications: . heparin subcutaneous  5,000 Units Subcutaneous Q8H  . hydrocortisone sodium succinate  50 mg Intravenous Q12H  . insulin aspart  0-15 Units Subcutaneous 6 times per day  . micafungin (MYCAMINE) IV  100 mg Intravenous QHS  . morphine   Intravenous 6 times per day  . pantoprazole (PROTONIX) IV  40 mg Intravenous QHS  . piperacillin-tazobactam (ZOSYN)  IV  3.375 g Intravenous Q8H  . potassium chloride  10 mEq Intravenous Q1 Hr x 4  . vancomycin  1,000 mg Intravenous Q12H   . sodium chloride 10 mL/hr at 02/14/14 1659   Prior to Admission medications   Medication Sig Start Date End Date Taking? Authorizing Provider  calcium carbonate (TUMS - DOSED IN MG ELEMENTAL CALCIUM) 500 MG chewable tablet Chew 1 tablet by mouth daily. As needed   Yes Historical Provider, MD  calcium-vitamin D (OSCAL WITH D) 500-200 MG-UNIT per tablet Take 1 tablet by mouth daily.    Yes Historical Provider, MD  dexamethasone (DECADRON) 4 MG tablet Take 2 tablets (8 mg total) by mouth 2 (two) times daily. Start the day before Taxotere. Then again the day after chemo for 3 days. 12/21/13  Yes [name], MD  lidocaine-prilocaine (EMLA) cream Apply topically as needed. 12/21/13  Yes [name], MD  loratadine (CLARITIN) 10 MG tablet Take 10 mg by mouth daily. lst dose 12/11/13 12/21/13  Yes Historical Provider, MD  LORazepam (ATIVAN) 0.5 MG tablet Take 1 tablet (0.5 mg total) by mouth every 6 (six) hours as needed (Nausea or vomiting). 12/21/13  Yes [name], MD  omeprazole (PRILOSEC) 20 MG capsule Take 20 mg by mouth daily.   Yes Historical Provider, MD  ondansetron (ZOFRAN) 8 MG tablet Take 1 tablet (8 mg total) by mouth 2 (two) times daily. Start the day after chemo for 3 days. Then take as needed for nausea or vomiting. 12/21/13  Yes [name], MD  polyethylene  glycol (MIRALAX / GLYCOLAX) packet Take 17 g by mouth daily as needed for mild constipation.   Yes Historical Provider, MD  prochlorperazine (COMPAZINE) 10 MG tablet Take 1 tablet (10 mg total) by mouth every 6 (six) hours as needed (Nausea or vomiting). 12/21/13  Yes [name], MD  sennosides-docusate sodium (SENOKOT-S) 8.6-50 MG tablet Take 1 tablet by mouth daily.   Yes Historical Provider, MD  telmisartan (MICARDIS) 40 MG tablet Take 40 mg by mouth daily.   Yes Historical Provider, MD     Assessment/Plan 1.  Perforated proximal left colon with abscess along he left colonic gutter,  Exploratory laparotomy with removal of left colon and splenic flexure (mobilization of the splenic flexure),  Hartmann pouch, and left transverse colon end colostomy. SURGEON: Fenton Malling. Lucia Gaskins, M.D. 02/11/14. 2.  Right breast cancer T1Nx with chemotherapy and right partial mastectomy 12/12/13, Dr. Armandina Gemma.  Right subclavian Porta cath 01/02/14, Dr. Harlow Asa  Has had 2 cycles of Docetaxel and Cyclophosphamide - Dr. Katherina Mires Cornetto   Right partial mastectomy - 11/2013 - T. Gerkin   Sees Dr. Pablo Ledger for rad tx. She is interested in getting her rad tx in [place].   3.  Sepsis with hypotension requiring Pressor support.  (off pressor support 02/13/14) 4.  Post op anemia (multifactorial) 5.  Post op pulmonary edema  (2D Echo 5/18, EF 65%) 6.  Hx of hypertension 7.  Hx of GERD 8.  Adrenal insufficiency - on Decadron Outpatient chemotherapy 9.  Heparin for DVT   Plan:  She can go to floor when OK with CCM, she has PT ordered, Wound vac change tomorrow.  We will need recommendations on steroids. Foley is out.    LOS: 5 days    Earnstine Regal 02/15/2014

## 2014-02-15 NOTE — Progress Notes (Signed)
ATTENDING ADDENDUM:  I personally reviewed patient's record, examined the patient, and formulated the following assessment and plan:  Doing well.  Will get recommendations for steroids from CCM and then transfer to the floor.  Air in colostomy bag.  Will start clears.

## 2014-02-15 NOTE — Progress Notes (Signed)
Have placed steroid taper in orders.   Marni Griffon ACNP-BC [place] Pager # [phone] OR # [phone] if no answer

## 2014-02-15 NOTE — Progress Notes (Signed)
[facility] ICU Electrolyte Replacement Protocol  Patient Name: Martha Evans DOB: 10-02-1945 MRN: [id number]  Date of Service  02/15/2014   HPI/Events of Note    Recent Labs Lab 02/11/14 0030  02/11/14 1945 02/11/14 2109 02/12/14 0430 02/13/14 0333 02/14/14 0317 02/15/14 0329  NA 138  --  QUESTIONABLE RESULTS, RECOMMEND RECOLLECT TO VERIFY 136* 130* 140 140 144  K 3.7  --  QUESTIONABLE RESULTS, RECOMMEND RECOLLECT TO VERIFY 4.1 3.6* 3.7 3.3* 3.2*  CL 100  --  QUESTIONABLE RESULTS, RECOMMEND RECOLLECT TO VERIFY 100 97 104 104 105  CO2  --   < > QUESTIONABLE RESULTS, RECOMMEND RECOLLECT TO VERIFY 26 23 26 26 30   GLUCOSE 109*  --  QUESTIONABLE RESULTS, RECOMMEND RECOLLECT TO VERIFY 165* 390* 114* 109* 87  BUN 23  --  QUESTIONABLE RESULTS, RECOMMEND RECOLLECT TO VERIFY 15 11 14 18 22   CREATININE 0.80  --  QUESTIONABLE RESULTS, RECOMMEND RECOLLECT TO VERIFY 0.60 0.59 0.61 0.72 1.05  CALCIUM  --   < > QUESTIONABLE RESULTS, RECOMMEND RECOLLECT TO VERIFY 7.6* 7.2* 8.1* 8.4 8.7  MG  --   --  QUESTIONABLE RESULTS, RECOMMEND RECOLLECT TO VERIFY 1.9  --   --  2.4  --   PHOS  --   --  QUESTIONABLE RESULTS, RECOMMEND RECOLLECT TO VERIFY 3.0  --   --  2.6  --   < > = values in this interval not displayed.  Estimated Creatinine Clearance: 58.8 ml/min (by C-G formula based on Cr of 1.05).  Intake/Output     05/19 0701 - 05/20 0700   I.V. (mL/kg) 160 (1.8)   IV Piggyback 962.5   Total Intake(mL/kg) 1122.5 (12.8)   Urine (mL/kg/hr) 1350 (0.6)   Total Output 1350   Net -227.5        - I/O DETAILED x24h    Total I/O In: 420 [I.V.:70; IV Piggyback:350] Out: 925 [Urine:925] - I/O THIS SHIFT    ASSESSMENT   eICURN Interventions  K+ 3.6 Mg 1.6 ICU Electrolyte Replacement Protocol criteria met.Labs Replaced per protocol.MD notified    ASSESSMENT: MAJOR ELECTROLYTE    Lorene Dy 02/15/2014, 6:14 AM

## 2014-02-15 NOTE — Evaluation (Signed)
Physical Therapy Evaluation Patient Details Name: Martha Evans MRN: [id number] DOB: July 27, 1946 Today's Date: 02/15/2014   History of Present Illness  pt admitted 02/10/14 with perforated colon with abcess, s/p exploratory lap, colostomy, wound VAC,postop setic shock, hypotension. Pt has recent h/p of R breast cancer/mastectomy in 3/15.  Clinical Impression  Pt very motivated to participate. Tolerated  incresed distance ambulating this session. Pt will benefit from PT to address problems listed below.     Follow Up Recommendations Home health PT;Supervision/Assistance - 24 hour    Equipment Recommendations  Rolling walker with 5" wheels    Recommendations for Other Services       Precautions / Restrictions Precautions Precautions: Fall Precaution Comments: VAC abdomen      Mobility  Bed Mobility Overal bed mobility: Needs Assistance Bed Mobility: Sidelying to Sit;Rolling;Sit to Sidelying Rolling: Min assist Sidelying to sit: Min assist;HOB elevated     Sit to sidelying: Min assist;HOB elevated General bed mobility comments: assistance to get into upright position to decrese abdominal use,  Transfers Overall transfer level: Needs assistance Equipment used: Rolling walker (2 wheeled) Transfers: Sit to/from Stand Sit to Stand: Min guard         General transfer comment: cues for safety and hand placement.  Ambulation/Gait Ambulation/Gait assistance: Min assist;+2 safety/equipment Ambulation Distance (Feet): 25 Feet Assistive device: Rolling walker (2 wheeled) Gait Pattern/deviations: Step-through pattern Gait velocity: decreased   General Gait Details: pt reports RW provides more stability  Stairs            Wheelchair Mobility    Modified Rankin (Stroke Patients Only)       Balance Overall balance assessment: Needs assistance Sitting-balance support: No upper extremity supported;Feet supported Sitting balance-Leahy Scale: Fair     Standing  balance support: No upper extremity supported;During functional activity Standing balance-Leahy Scale: Fair                               Pertinent Vitals/Pain 2-3, abdomen, declined PCA.    Home Living Family/patient expects to be discharged to:: Private residence Living Arrangements: Spouse/significant other Available Help at Discharge: Family Type of Home: House Home Access: Stairs to enter Entrance Stairs-Rails: Right Entrance Stairs-Number of Steps: 5 Home Layout: One level Home Equipment: None      Prior Function Level of Independence: Independent               Hand Dominance        Extremity/Trunk Assessment   Upper Extremity Assessment: Overall WFL for tasks assessed           Lower Extremity Assessment: Overall WFL for tasks assessed      Cervical / Trunk Assessment: Normal  Communication   Communication: No difficulties  Cognition Arousal/Alertness: Awake/alert Behavior During Therapy: WFL for tasks assessed/performed Overall Cognitive Status: Within Functional Limits for tasks assessed                      General Comments      Exercises        Assessment/Plan    PT Assessment Patient needs continued PT services  PT Diagnosis Difficulty walking;Acute pain   PT Problem List Decreased strength;Decreased activity tolerance;Decreased mobility;Decreased knowledge of precautions;Decreased safety awareness;Decreased knowledge of use of DME;Pain  PT Treatment Interventions DME instruction;Gait training;Stair training;Functional mobility training;Therapeutic activities;Patient/family education   PT Goals (Current goals can be found in the Care Plan section) Acute Rehab  PT Goals Patient Stated Goal: I want to walk as much as I can. PT Goal Formulation: With patient/family Time For Goal Achievement: 03/01/14 Potential to Achieve Goals: Good    Frequency Min 3X/week   Barriers to discharge        Co-evaluation                End of Session   Activity Tolerance: Patient tolerated treatment well Patient left: in bed;with call bell/phone within reach;with nursing/sitter in room;with family/visitor present Nurse Communication: Mobility status         Time: 0925-0949 PT Time Calculation (min): 24 min   Charges:   PT Evaluation $Initial PT Evaluation Tier I: 1 Procedure PT Treatments $Gait Training: 23-37 mins   PT G Codes:          Claretha Cooper 02/15/2014, 10:46 AM Tresa Endo PT [phone]

## 2014-02-16 DIAGNOSIS — E876 Hypokalemia: Secondary | ICD-10-CM

## 2014-02-16 LAB — BASIC METABOLIC PANEL
BUN: 17 mg/dL (ref 6–23)
CALCIUM: 8.4 mg/dL (ref 8.4–10.5)
CO2: 31 mEq/L (ref 19–32)
CREATININE: 0.98 mg/dL (ref 0.50–1.10)
Chloride: 104 mEq/L (ref 96–112)
GFR calc non Af Amer: 59 mL/min — ABNORMAL LOW (ref >90)
GFR, EST AFRICAN AMERICAN: 68 mL/min — AB (ref >90)
Glucose, Bld: 100 mg/dL — ABNORMAL HIGH (ref 70–99)
Potassium: 3 mEq/L — ABNORMAL LOW (ref 3.7–5.3)
Sodium: 145 mEq/L (ref 137–147)

## 2014-02-16 LAB — GLUCOSE, CAPILLARY
GLUCOSE-CAPILLARY: 102 mg/dL — AB (ref 70–99)
GLUCOSE-CAPILLARY: 98 mg/dL (ref 70–99)
Glucose-Capillary: 121 mg/dL — ABNORMAL HIGH (ref 70–99)
Glucose-Capillary: 132 mg/dL — ABNORMAL HIGH (ref 70–99)
Glucose-Capillary: 91 mg/dL (ref 70–99)
Glucose-Capillary: 99 mg/dL (ref 70–99)

## 2014-02-16 LAB — MAGNESIUM: MAGNESIUM: 2.2 mg/dL (ref 1.5–2.5)

## 2014-02-16 MED ORDER — MORPHINE SULFATE 2 MG/ML IJ SOLN
1.0000 mg | INTRAMUSCULAR | Status: DC | PRN
  Administered 2014-02-16 – 2014-02-18 (×2): 2 mg via INTRAVENOUS
  Filled 2014-02-16 (×3): qty 1

## 2014-02-16 MED ORDER — MORPHINE SULFATE 15 MG PO TABS: 7.5000 mg | ORAL_TABLET | ORAL | Status: DC | PRN

## 2014-02-16 MED ORDER — POTASSIUM CHLORIDE 10 MEQ/50ML IV SOLN
10.0000 meq | INTRAVENOUS | Status: AC
  Administered 2014-02-16 (×5): 10 meq via INTRAVENOUS
  Filled 2014-02-16 (×6): qty 50

## 2014-02-16 MED ORDER — NAPROXEN 500 MG PO TABS
500.0000 mg | ORAL_TABLET | Freq: Two times a day (BID) | ORAL | Status: DC
  Administered 2014-02-16 – 2014-02-17 (×3): 500 mg via ORAL
  Filled 2014-02-16 (×5): qty 1

## 2014-02-16 NOTE — Consult Note (Addendum)
WOC ostomy follow up Stoma type/location: LLQ Colostomy Stomal assessment/size: 1 and 5/8 inches round, moist. Os at center Peristomal assessment: intact, clear Treatment options for stomal/peristomal skin: none indicated Output soft brown stool Ostomy pouching: 2pc. 2 and 1/4 inches  Education provided: demonstration of pouch resizing, pouch removal and application.  Plan to teach emptying tomorrow Enrolled patient in [place] Start Discharge program: Yes  WOC wound consult note Reason for Consult: Wound type:surgical Pressure Ulcer POA: No Measurement:21cm x 5 x 3cm Wound QTM:AUQJF, pink, dry Drainage (amount, consistency, odor)  Periwound:intact Dressing procedure/placement/frequency: NPWT dressing placed using 1 piece of black foam and minimal drape.  Suction of 116mmHg continuous pressure achieved without difficulty and tolerated well. Plan to transition from T-TH-Sat to M-W-Friday next week.  [facility] nursing team will follow, will remain available to this patient, the nursing and medical team.   Thanks, Maudie Flakes, MSN, RN, [place], [place], [place] [phone])

## 2014-02-16 NOTE — Progress Notes (Signed)
Wasted 51ml of dilaudid from PCA tubing that was d/c'd with Sigurd Sos, RN Birdie Hopes

## 2014-02-16 NOTE — ED Provider Notes (Signed)
Medical screening examination/treatment/procedure(s) were performed by non-physician practitioner and as supervising physician I was immediately available for consultation/collaboration.   EKG Interpretation None       Teressa Lower, MD 02/16/14 2303

## 2014-02-16 NOTE — Progress Notes (Signed)
Awaiting for on-call Dr. to report abnormal result of potassium

## 2014-02-16 NOTE — Progress Notes (Signed)
5 Days Post-Op  Subjective: She feels better she has had two meals with clears and did well. She has some stool in her ostomy bag, and was up in the chair and walked yesterday.  She says she has issues with hydrocodone, oxycodone, tramadol and codeine.  She take aleve at home.  Objective: Vital signs in last 24 hours: Temp:  [97.6 F (36.4 C)-98.1 F (36.7 C)] 97.7 F (36.5 C) (05/21 0513) Pulse Rate:  [56-73] 60 (05/21 0513) Resp:  [13-24] 15 (05/21 0513) BP: (102-134)/(64-79) 117/73 mmHg (05/21 0513) SpO2:  [92 %-98 %] 92 % (05/21 0513) FiO2 (%):  [34 %-42 %] 36 % (05/21 0432)  Nothing PO recorded.  Clears ordered Nothing from ostomy recorded Afebrile, VSS, K+ 3.0  She is getting fluids thru the portacath Intake/Output from previous day: 05/20 0701 - 05/21 0700 In: 683 [I.V.:233; IV Piggyback:450] Out: 1975 [NIDPO:2423] Intake/Output this shift:    General appearance: alert, cooperative and no distress Resp: she has some wheezing bilaterally Cardiac;  Regular rhythm GI:  Soft, +BS, soft liquid stool and gas in ostomy bag, Wound vac in place Lab Results:   Recent Labs  02/14/14 0317 02/15/14 0329  WBC 5.9 11.6*  HGB 7.2* 8.6*  HCT 22.2* 27.0*  PLT 243 332    BMET  Recent Labs  02/15/14 0329 02/16/14 0455  NA 144 145  K 3.2* 3.0*  CL 105 104  CO2 30 31  GLUCOSE 87 100*  BUN 22 17  CREATININE 1.05 0.98  CALCIUM 8.7 8.4   PT/INR No results found for this basename: LABPROT, INR,  in the last 72 hours   Recent Labs Lab 02/11/14 1945 02/11/14 2109 02/15/14 0329  AST QUESTIONABLE RESULTS, RECOMMEND RECOLLECT TO VERIFY 19 19  ALT QUESTIONABLE RESULTS, RECOMMEND RECOLLECT TO VERIFY 29 21  ALKPHOS QUESTIONABLE RESULTS, RECOMMEND RECOLLECT TO VERIFY 96 107  BILITOT QUESTIONABLE RESULTS, RECOMMEND RECOLLECT TO VERIFY 1.7* 0.6  PROT QUESTIONABLE RESULTS, RECOMMEND RECOLLECT TO VERIFY 5.1* 5.5*  ALBUMIN QUESTIONABLE RESULTS, RECOMMEND RECOLLECT TO VERIFY 2.0*  2.2*     Lipase  No results found for this basename: lipase     Studies/Results: No results found.  Medications: . heparin subcutaneous  5,000 Units Subcutaneous Q8H  . hydrocortisone sod succinate (SOLU-CORTEF) inj  50 mg Intravenous Daily   Followed by  . [START ON 02/17/2014] hydrocortisone sod succinate (SOLU-CORTEF) inj  25 mg Intravenous Daily   Followed by  . [START ON 02/18/2014] hydrocortisone sod succinate (SOLU-CORTEF) inj  12.5 mg Intravenous Daily  . insulin aspart  0-15 Units Subcutaneous TID WC  . morphine   Intravenous 6 times per day  . pantoprazole (PROTONIX) IV  40 mg Intravenous QHS  . piperacillin-tazobactam (ZOSYN)  IV  3.375 g Intravenous Q8H  . vancomycin  1,000 mg Intravenous Q12H   . sodium chloride 10 mL/hr at 02/14/14 1659    Assessment/Plan 1. Perforated proximal left colon with abscess along he left colonic gutter,  Exploratory laparotomy with removal of left colon and splenic flexure (mobilization of the splenic flexure), Hartmann pouch, and left transverse colon end colostomy. SURGEON: Fenton Malling. Lucia Gaskins, M.D. 02/11/14.  2. Right breast cancer T1Nx with chemotherapy and right partial mastectomy 12/12/13, Dr. Armandina Gemma. Right subclavian Porta cath 01/02/14, Dr. Harlow Asa  Has had 2 cycles of Docetaxel and Cyclophosphamide - Dr. Katherina Mires Cornetto  Right partial mastectomy - 11/2013 - T. Gerkin  Sees Dr. Pablo Ledger for rad tx. She is interested in getting her rad tx in  [place].  3. Sepsis with hypotension requiring Pressor support. (off pressor support 02/13/14)  4. Post op anemia (multifactorial)  5. Post op pulmonary edema (2D Echo 5/18, EF 65%)  6. Hx of hypertension  7. Hx of GERD  8. Adrenal insufficiency - on Decadron Outpatient chemotherapy  9. Heparin for DVT 10.  Hypokalemia  Plan:  Replace K+, check mag, stop the Morphine PCA, I will give her oral and IV MS along with Aleve she says she tolerates well.  Advance to a full liquid diet.  She is  on Solu-cortef and this is being weaned.  Continue to mobilize. Day 6 of antibiotics, Currently on Zosyn, Vancomycin and Micafungin.  LOS: 6 days    Earnstine Regal 02/16/2014

## 2014-02-16 NOTE — Progress Notes (Signed)
ATTENDING ADDENDUM:  I personally reviewed patient's record, examined the patient, and formulated the following assessment and plan:  Seems to be doing well.  Having bowel function.  Will advance diet.

## 2014-02-17 ENCOUNTER — Inpatient Hospital Stay (HOSPITAL_COMMUNITY): Payer: Medicare Other

## 2014-02-17 DIAGNOSIS — D72829 Elevated white blood cell count, unspecified: Secondary | ICD-10-CM

## 2014-02-17 DIAGNOSIS — N289 Disorder of kidney and ureter, unspecified: Secondary | ICD-10-CM

## 2014-02-17 DIAGNOSIS — N179 Acute kidney failure, unspecified: Secondary | ICD-10-CM | POA: Insufficient documentation

## 2014-02-17 LAB — BASIC METABOLIC PANEL
BUN: 18 mg/dL (ref 6–23)
BUN: 20 mg/dL (ref 6–23)
CALCIUM: 8.6 mg/dL (ref 8.4–10.5)
CALCIUM: 8.8 mg/dL (ref 8.4–10.5)
CO2: 27 mEq/L (ref 19–32)
CO2: 27 meq/L (ref 19–32)
CREATININE: 2.11 mg/dL — AB (ref 0.50–1.10)
CREATININE: 2.54 mg/dL — AB (ref 0.50–1.10)
Chloride: 103 mEq/L (ref 96–112)
Chloride: 99 mEq/L (ref 96–112)
GFR calc Af Amer: 28 mL/min — ABNORMAL LOW (ref >90)
GFR calc non Af Amer: 24 mL/min — ABNORMAL LOW (ref >90)
GFR calc non Af Amer: 19 mL/min — ABNORMAL LOW (ref >90)
GFR, EST AFRICAN AMERICAN: 22 mL/min — AB (ref >90)
GLUCOSE: 97 mg/dL (ref 70–99)
Glucose, Bld: 154 mg/dL — ABNORMAL HIGH (ref 70–99)
Potassium: 3.1 mEq/L — ABNORMAL LOW (ref 3.7–5.3)
Potassium: 3.4 mEq/L — ABNORMAL LOW (ref 3.7–5.3)
Sodium: 140 mEq/L (ref 137–147)
Sodium: 140 mEq/L (ref 137–147)

## 2014-02-17 LAB — URINE MICROSCOPIC-ADD ON

## 2014-02-17 LAB — URINALYSIS, ROUTINE W REFLEX MICROSCOPIC
BILIRUBIN URINE: NEGATIVE
Glucose, UA: NEGATIVE mg/dL
HGB URINE DIPSTICK: NEGATIVE
Ketones, ur: NEGATIVE mg/dL
Leukocytes, UA: NEGATIVE
Nitrite: NEGATIVE
Protein, ur: 30 mg/dL — AB
SPECIFIC GRAVITY, URINE: 1.016 (ref 1.005–1.030)
Urobilinogen, UA: 1 mg/dL (ref 0.0–1.0)
pH: 5 (ref 5.0–8.0)

## 2014-02-17 LAB — CBC
HCT: 25.7 % — ABNORMAL LOW (ref 36.0–46.0)
Hemoglobin: 8.4 g/dL — ABNORMAL LOW (ref 12.0–15.0)
MCH: 29.6 pg (ref 26.0–34.0)
MCHC: 32.7 g/dL (ref 30.0–36.0)
MCV: 90.5 fL (ref 78.0–100.0)
Platelets: 318 10*3/uL (ref 150–400)
RBC: 2.84 MIL/uL — ABNORMAL LOW (ref 3.87–5.11)
RDW: 14.1 % (ref 11.5–15.5)
WBC: 20.1 10*3/uL — ABNORMAL HIGH (ref 4.0–10.5)

## 2014-02-17 LAB — GLUCOSE, CAPILLARY
Glucose-Capillary: 94 mg/dL (ref 70–99)
Glucose-Capillary: 182 mg/dL — ABNORMAL HIGH (ref 70–99)
Glucose-Capillary: 123 mg/dL — ABNORMAL HIGH (ref 70–99)

## 2014-02-17 LAB — VANCOMYCIN, RANDOM: VANCOMYCIN RM: 33.6 ug/mL

## 2014-02-17 MED ORDER — ACETAMINOPHEN 325 MG PO TABS: 650.0000 mg | ORAL_TABLET | Freq: Four times a day (QID) | ORAL | Status: DC | PRN

## 2014-02-17 MED ORDER — POTASSIUM CHLORIDE CRYS ER 20 MEQ PO TBCR
40.0000 meq | EXTENDED_RELEASE_TABLET | Freq: Two times a day (BID) | ORAL | Status: DC
  Filled 2014-02-17 (×2): qty 2

## 2014-02-17 MED ORDER — ENSURE COMPLETE PO LIQD
237.0000 mL | Freq: Two times a day (BID) | ORAL | Status: DC
  Administered 2014-02-17 – 2014-02-23 (×7): 237 mL via ORAL

## 2014-02-17 MED ORDER — POTASSIUM CHLORIDE CRYS ER 20 MEQ PO TBCR
40.0000 meq | EXTENDED_RELEASE_TABLET | Freq: Once | ORAL | Status: AC
  Administered 2014-02-17: 40 meq via ORAL
  Filled 2014-02-17: qty 2

## 2014-02-17 MED ORDER — ENSURE PUDDING PO PUDG
1.0000 | Freq: Three times a day (TID) | ORAL | Status: DC
  Administered 2014-02-17 – 2014-02-20 (×5): 1 via ORAL
  Filled 2014-02-17 (×15): qty 1

## 2014-02-17 MED ORDER — SODIUM CHLORIDE 0.9 % IV SOLN
INTRAVENOUS | Status: DC
  Administered 2014-02-17 – 2014-02-21 (×9): via INTRAVENOUS
  Administered 2014-02-23: 50 mL via INTRAVENOUS

## 2014-02-17 NOTE — Progress Notes (Signed)
NUTRITION FOLLOW UP  Intervention:   - Continue Ensure Complete BID and Ensure pudding TID - Assisted pt with ordering dinner - Pt aware of importance of low fiber diet for new colostomy - RD to continue to monitor  Nutrition Dx:   Inadequate oral intake related to inability to eat as evidenced by NPO - resolved, diet advanced, pt eating 100% of meals  New nutrition dx: Increased nutrient needs related to GI surgery as evidenced by MD notes.    Goal:   Advance diet as tolerated to low fiber diet - met  New goal: Pt to consume >90% of meals/supplements   Monitor:   Weights, labs, intake  Assessment:   Pt admitted with abdominal pain, found to have pnuemoperitoneum/abscesses along left colon gutter/sigmoid colon likely secondary to perforated colon. Has right breast CA, invasive ductal CA and is s/p 2 cycles of chemotherapy.   5/18: -Pt reports having chemotherapy last Monday and was eating light due to treatment  -Was eating things every 2-3 hours like soup or 1/2 sandwich  -Reports her weight fluctuates up and down depending on treatment and how well she eats, but normally stays around 186 pounds  -Had exploratory laparotomy with removal of left colon and splenic fixture, Hartmann pouch, and left transverse colon end colostomy 5/16  -NPO currently except ice chips  5/22: -Diet advanced to soft today -Pt reports eating all of her meals today, 100% of french toast for breakfast and 100% of grilled cheese and mashed potatoes -Drank 100% of 1 Ensure Complete so far today -Denies any nausea   Potassium low - getting oral replacement    Height: Ht Readings from Last 1 Encounters:  02/11/14 5' 6" (1.676 m)    Weight Status:   Wt Readings from Last 1 Encounters:  02/14/14 193 lb 9 oz (87.8 kg)    Re-estimated needs:  Kcal: 1500-1700  Protein: 70-85g  Fluid: 1.5-1.7L/day   Skin: +1 RLE, LLE edema, abdominal and right breast incision   Diet Order:  Criss Rosales   Intake/Output Summary (Last 24 hours) at 02/17/14 1504 Last data filed at 02/17/14 1503  Gross per 24 hour  Intake 1130.67 ml  Output   1475 ml  Net -344.33 ml    Last BM: PTA   Labs:   Recent Labs Lab 02/11/14 1945 02/11/14 2109  02/14/14 0317  02/16/14 0455 02/17/14 0447 02/17/14 1350  NA QUESTIONABLE RESULTS, RECOMMEND RECOLLECT TO VERIFY 136*  < > 140  < > 145 140 140  K QUESTIONABLE RESULTS, RECOMMEND RECOLLECT TO VERIFY 4.1  < > 3.3*  < > 3.0* 3.1* 3.4*  CL QUESTIONABLE RESULTS, RECOMMEND RECOLLECT TO VERIFY 100  < > 104  < > 104 103 99  CO2 QUESTIONABLE RESULTS, RECOMMEND RECOLLECT TO VERIFY 26  < > 26  < > _0 BUN QUESTIONABLE RESULTS, RECOMMEND RECOLLECT TO VERIFY 15  < > 18  < > _1 CREATININE QUESTIONABLE RESULTS, RECOMMEND RECOLLECT TO VERIFY 0.60  < > 0.72  < > 0.98 2.11* 2.54*  CALCIUM QUESTIONABLE RESULTS, RECOMMEND RECOLLECT TO VERIFY 7.6*  < > 8.4  < > 8.4 8.6 8.8  MG QUESTIONABLE RESULTS, RECOMMEND RECOLLECT TO VERIFY 1.9  --  2.4  --  2.2  --   --   PHOS QUESTIONABLE RESULTS, RECOMMEND RECOLLECT TO VERIFY 3.0  --  2.6  --   --   --   --   GLUCOSE QUESTIONABLE RESULTS, RECOMMEND RECOLLECT TO VERIFY 165*  < >  109*  < > 100* 97 154*  < > = values in this interval not displayed.  CBG (last 3)   Recent Labs  02/16/14 1651 02/16/14 2231 02/17/14 0730  GLUCAP 132* 91 94    Scheduled Meds: . feeding supplement (ENSURE COMPLETE)  237 mL Oral BID BM  . feeding supplement (ENSURE)  1 Container Oral TID BM  . heparin subcutaneous  5,000 Units Subcutaneous Q8H  . [START ON 02/18/2014] hydrocortisone sod succinate (SOLU-CORTEF) inj  12.5 mg Intravenous Daily  . insulin aspart  0-15 Units Subcutaneous TID WC  . pantoprazole (PROTONIX) IV  40 mg Intravenous QHS  . piperacillin-tazobactam (ZOSYN)  IV  3.375 g Intravenous Q8H  . potassium chloride  40 mEq Oral Once    Continuous Infusions: . sodium chloride 10 mL/hr at 02/16/14 1853  .  sodium chloride 100 mL/hr at 02/17/14 0939     Heather Baron MS, RD, LDN [phone] Pager [phone] After Hours Pager  

## 2014-02-17 NOTE — Progress Notes (Signed)
ATTENDING ADDENDUM:  I personally reviewed patient's record, examined the patient, and formulated the following assessment and plan:  Feeling better, just tired.  WBC elevated.  No signs of infection that I can see.  GI system functioning well.  No fevers, No cough, No dysuria.  Possibly due to steroids??  Cr elevated as well.  Will have pharmacy check Vanc level and will d/c until that is evaluated.  D/C nephrotoxic meds. Hypokalemia- I am reluctant to replace this aggressively due to elevated Cr and decreased GFR.   Will have Medicine see pt for further evaluation.

## 2014-02-17 NOTE — Progress Notes (Signed)
6 Days Post-Op  Subjective: She is feeling better, she did notice her urine output is less frequent the last day or so.  Abdomen is doing fine, dressing change from wound care nurse yesterday reported it looked very good.  Objective: Vital signs in last 24 hours: Temp:  [97.6 F (36.4 C)-97.8 F (36.6 C)] 97.6 F (36.4 C) (05/22 0600) Pulse Rate:  [67-84] 67 (05/22 0600) Resp:  [16] 16 (05/22 0600) BP: (122-139)/(67-78) 128/78 mmHg (05/22 0600) SpO2:  [92 %-95 %] 92 % (05/22 0600)    I/O down last couple days, but total fluid balance is positive 480 PO 450 stool recorded. Full liquids Afebrile, VSS Creatinine went from .98 up to 2.11 this AM.  Vancomycin and naproxen both discontinued.  Medicine consult called. WBC rising  She has had 6 days of Vancomycin,  7 days of Zosyn, and 5 days of micafungin. No films Intake/Output from previous day: 05/21 0701 - 05/22 0700 In: 890.7 [P.O.:480; I.V.:110.7; IV Piggyback:300] Out: 5852 [Urine:1200; Stool:450] Intake/Output this shift:    General appearance: alert, cooperative and no distress Resp: clear to auscultation bilaterally and no wheezing this AM GI: soft, + BS, ostomy working, wound vac in place.  Lab Results:   Recent Labs  02/15/14 0329 02/17/14 0447  WBC 11.6* 20.1*  HGB 8.6* 8.4*  HCT 27.0* 25.7*  PLT 332 318    BMET  Recent Labs  02/16/14 0455 02/17/14 0447  NA 145 140  K 3.0* 3.1*  CL 104 103  CO2 31 27  GLUCOSE 100* 97  BUN 17 18  CREATININE 0.98 2.11*  CALCIUM 8.4 8.6   PT/INR No results found for this basename: LABPROT, INR,  in the last 72 hours   Recent Labs Lab 02/11/14 1945 02/11/14 2109 02/15/14 0329  AST QUESTIONABLE RESULTS, RECOMMEND RECOLLECT TO VERIFY 19 19  ALT QUESTIONABLE RESULTS, RECOMMEND RECOLLECT TO VERIFY 29 21  ALKPHOS QUESTIONABLE RESULTS, RECOMMEND RECOLLECT TO VERIFY 96 107  BILITOT QUESTIONABLE RESULTS, RECOMMEND RECOLLECT TO VERIFY 1.7* 0.6  PROT QUESTIONABLE  RESULTS, RECOMMEND RECOLLECT TO VERIFY 5.1* 5.5*  ALBUMIN QUESTIONABLE RESULTS, RECOMMEND RECOLLECT TO VERIFY 2.0* 2.2*     Lipase  No results found for this basename: lipase     Studies/Results: No results found.  Medications: . feeding supplement (ENSURE COMPLETE)  237 mL Oral BID BM  . feeding supplement (ENSURE)  1 Container Oral TID BM  . heparin subcutaneous  5,000 Units Subcutaneous Q8H  . hydrocortisone sod succinate (SOLU-CORTEF) inj  25 mg Intravenous Daily   Followed by  . [START ON 02/18/2014] hydrocortisone sod succinate (SOLU-CORTEF) inj  12.5 mg Intravenous Daily  . insulin aspart  0-15 Units Subcutaneous TID WC  . naproxen  500 mg Oral BID WC  . pantoprazole (PROTONIX) IV  40 mg Intravenous QHS  . piperacillin-tazobactam (ZOSYN)  IV  3.375 g Intravenous Q8H   . sodium chloride 10 mL/hr at 02/16/14 1853    Assessment/Plan 1. Perforated proximal left colon with abscess along he left colonic gutter,  Exploratory laparotomy with removal of left colon and splenic flexure (mobilization of the splenic flexure), Hartmann pouch, and left transverse colon end colostomy. SURGEON: Fenton Malling. Lucia Gaskins, M.D. 02/11/14.  2. Right breast cancer T1Nx with chemotherapy and right partial mastectomy 12/12/13, Dr. Armandina Gemma. Right subclavian Porta cath 01/02/14, Dr. Harlow Asa  Has had 2 cycles of Docetaxel and Cyclophosphamide - Dr. Katherina Mires Cornetto  Right partial mastectomy - 11/2013 - T. Gerkin  Sees Dr. Pablo Ledger for rad  tx. She is interested in getting her rad tx in [place].  3. Sepsis with hypotension requiring Pressor support. (off pressor support 02/13/14)  4. Post op anemia (multifactorial)  5. Post op pulmonary edema (2D Echo 5/18, EF 65%)  6. Hx of hypertension  7. Hx of GERD  8. Adrenal insufficiency - on Decadron Outpatient chemotherapy  9. Heparin for DVT  10. Hypokalemia 11.  New acute renal insuffiencey,  12.  Leukocytosis     Plan:  Advance to a low fiber diet,  hydrate, stop vancomycin and naprosyn, ask medicine to see.  Continue Zosyn with elevated WBC.  Daily BMP and CBC again tomorrow.  Home with wound vac, hopefully early next week.  LOS: 7 days    Earnstine Regal 02/17/2014

## 2014-02-17 NOTE — H&P (Signed)
PCP:   [name], MD   Chief Complaint:  Acute Kidney injury  HPI:  68 y/o female who   has a past medical history of Hypertension; Allergy; Wears hearing aid; GERD (gastroesophageal reflux disease); Complication of anesthesia; Difficult intubation (1999); Dysrhythmia; and Cancer. Patient has history of breast cancer stage 1A invasive  ductal carcinoma, status post 2 cycles of chemotherapy adjuvant Docetaxel and Cyclophosphamide. Patient was admitted over the weekend due to perforated sigmoid colon. Patient underwent: Resection with colostomy, and was started on IV vancomycin and Zosyn. Patient also has a history of relative adrenal insufficiency. Has been on Decadron as outpatient as part of q. management. She was started on Solu-Cortef by PCCM. This morning, patient found to have elevated creatinine and leukocytosis. Hospitalist consulted for medical management. Patient admits to having dizziness last night, especially when she should up from sitting position. She denies dizziness at this time. Last night patient's systolic blood pressure was recorded 103 at 11 PM. She denies passing out. She denies chest pain shortness of breath, no nausea vomiting. Patient has colostomy bag in place. Today patient's white count was elevated to 20,000. She has been afebrile. Patient denies dysuria but has noticed reduced frequency of urination.    All 8 ergies:   Allergies  Allergen Reactions  . Codeine Sulfate Nausea And Vomiting  . Tramadol Nausea Only    Nausea       Past Medical History  Diagnosis Date  . Hypertension   . Allergy   . Wears hearing aid     right  . GERD (gastroesophageal reflux disease)   . Complication of anesthesia     was hard to intubate with thyroid surgery-99  . Difficult intubation 1999    when had thyroid lobectomy  . Dysrhythmia     irregular reuglar heart beat   . Cancer     breast cancer     Past Surgical History  Procedure Laterality Date  .  Thyroid lobectomy  1999    right  . Rotator cuff repair  2006    right shoulder  . Breast reduction surgery Bilateral     1980's  . Abdominal hysterectomy  1999  . Tonsillectomy      as child  . Appendectomy      as child  . Colonoscopy    . Partial mastectomy with needle localization and axillary sentinel lymph node bx Right 12/12/2013    Procedure: PARTIAL MASTECTOMY WITH NEEDLE LOCALIZATION AND AXILLARY SENTINEL LYMPH NODE BX;  Surgeon: Earnstine Regal, MD;  Location: [facility];  Service: General;  Laterality: Right;  . Portacath placement Left 01/02/2014    Procedure: INSERTION PORT-A-CATH;  Surgeon: Earnstine Regal, MD;  Location: [facility];  Service: General;  Laterality: Left;  . Laparotomy N/A 02/11/2014    Procedure: EXPLORATORY LAPAROTOMY, COLON RESECTION, COLOSTOMY;  Surgeon: Shann Medal, MD;  Location: [facility];  Service: General;  Laterality: N/A;    Prior to Admission medications   Medication Sig Start Date End Date Taking? Authorizing Provider  calcium carbonate (TUMS - DOSED IN MG ELEMENTAL CALCIUM) 500 MG chewable tablet Chew 1 tablet by mouth daily. As needed   Yes Historical Provider, MD  calcium-vitamin D (OSCAL WITH D) 500-200 MG-UNIT per tablet Take 1 tablet by mouth daily.    Yes Historical Provider, MD  dexamethasone (DECADRON) 4 MG tablet Take 2 tablets (8 mg total) by mouth 2 (two) times daily. Start the day before Taxotere. Then  again the day after chemo for 3 days. 12/21/13  Yes [name], MD  lidocaine-prilocaine (EMLA) cream Apply topically as needed. 12/21/13  Yes [name], MD  loratadine (CLARITIN) 10 MG tablet Take 10 mg by mouth daily. lst dose 12/11/13 12/21/13  Yes Historical Provider, MD  LORazepam (ATIVAN) 0.5 MG tablet Take 1 tablet (0.5 mg total) by mouth every 6 (six) hours as needed (Nausea or vomiting). 12/21/13  Yes [name], MD  omeprazole (PRILOSEC) 20 MG capsule Take 20 mg by mouth daily.   Yes Historical Provider, MD    ondansetron (ZOFRAN) 8 MG tablet Take 1 tablet (8 mg total) by mouth 2 (two) times daily. Start the day after chemo for 3 days. Then take as needed for nausea or vomiting. 12/21/13  Yes [name], MD  polyethylene glycol (MIRALAX / GLYCOLAX) packet Take 17 g by mouth daily as needed for mild constipation.   Yes Historical Provider, MD  prochlorperazine (COMPAZINE) 10 MG tablet Take 1 tablet (10 mg total) by mouth every 6 (six) hours as needed (Nausea or vomiting). 12/21/13  Yes [name], MD  sennosides-docusate sodium (SENOKOT-S) 8.6-50 MG tablet Take 1 tablet by mouth daily.   Yes Historical Provider, MD  telmisartan (MICARDIS) 40 MG tablet Take 40 mg by mouth daily.   Yes Historical Provider, MD    Social History:  reports that she has never smoked. She has never used smokeless tobacco. She reports that she drinks alcohol. She reports that she does not use illicit drugs.  Family History  Problem Relation Age of Onset  . Cancer Father     bladder & pancreatic     All the positives are listed in BOLD  Review of Systems:  HEENT: Headache, blurred vision, runny nose, sore throat, dizziness Neck: Hypothyroidism, hyperthyroidism,,lymphadenopathy Chest : Shortness of breath, history of COPD, Asthma Heart : Chest pain, history of coronary arterey disease GI:  Nausea, vomiting, diarrhea,  GU: Dysuria, urgency, frequency of urination, hematuria Neuro: Stroke, seizures, syncope Psych: Depression, anxiety, hallucinations   Physical Exam: Blood pressure 128/78, pulse 67, temperature 97.6 F (36.4 C), temperature source Oral, resp. rate 16, height 5' 6"  (1.676 m), weight 87.8 kg (193 lb 9 oz), SpO2 92.00%. Constitutional:   Patient is a well-developed and well-nourished female  in no acute distress and cooperative with exam. Head: Normocephalic and atraumatic Mouth: Mucus membranes moist Eyes: PERRL, EOMI, conjunctivae normal Neck: Supple, No Thyromegaly Cardiovascular: RRR, S1  normal, S2 normal Pulmonary/Chest: CTAB, no wheezes, rales, or rhonchi Abdominal: Soft, colostomy bag in place,  midline scar  Neurological: A&O x3, Strenght is normal and symmetric bilaterally, cranial nerve II-XII are grossly intact, no focal motor deficit, sensory intact to light touch bilaterally.  Extremities : No Cyanosis, Clubbing or Edema  Labs on Admission:  Basic Metabolic Panel:  Recent Labs Lab 02/11/14 0030  02/11/14 1945 02/11/14 2109  02/13/14 0333 02/14/14 0317 02/15/14 0329 02/16/14 0455 02/17/14 0447  NA 138  --  QUESTIONABLE RESULTS, RECOMMEND RECOLLECT TO VERIFY 136*  < > 140 140 144 145 140  K 3.7  --  QUESTIONABLE RESULTS, RECOMMEND RECOLLECT TO VERIFY 4.1  < > 3.7 3.3* 3.2* 3.0* 3.1*  CL 100  --  QUESTIONABLE RESULTS, RECOMMEND RECOLLECT TO VERIFY 100  < > 104 104 105 104 103  CO2  --   < > QUESTIONABLE RESULTS, RECOMMEND RECOLLECT TO VERIFY 26  < > 26 26 30 31 27   GLUCOSE 109*  --  QUESTIONABLE RESULTS, RECOMMEND RECOLLECT TO VERIFY 165*  < > 114* 109* 87 100* 97  BUN 23  --  QUESTIONABLE RESULTS, RECOMMEND RECOLLECT TO VERIFY 15  < > 14 18 22 17 18   CREATININE 0.80  --  QUESTIONABLE RESULTS, RECOMMEND RECOLLECT TO VERIFY 0.60  < > 0.61 0.72 1.05 0.98 2.11*  CALCIUM  --   < > QUESTIONABLE RESULTS, RECOMMEND RECOLLECT TO VERIFY 7.6*  < > 8.1* 8.4 8.7 8.4 8.6  MG  --   --  QUESTIONABLE RESULTS, RECOMMEND RECOLLECT TO VERIFY 1.9  --   --  2.4  --  2.2  --   PHOS  --   --  QUESTIONABLE RESULTS, RECOMMEND RECOLLECT TO VERIFY 3.0  --   --  2.6  --   --   --   < > = values in this interval not displayed. Liver Function Tests:  Recent Labs Lab 02/11/14 1945 02/11/14 2109 02/15/14 0329  AST QUESTIONABLE RESULTS, RECOMMEND RECOLLECT TO VERIFY 19 19  ALT QUESTIONABLE RESULTS, RECOMMEND RECOLLECT TO VERIFY 29 21  ALKPHOS QUESTIONABLE RESULTS, RECOMMEND RECOLLECT TO VERIFY 96 107  BILITOT QUESTIONABLE RESULTS, RECOMMEND RECOLLECT TO VERIFY 1.7* 0.6  PROT  QUESTIONABLE RESULTS, RECOMMEND RECOLLECT TO VERIFY 5.1* 5.5*  ALBUMIN QUESTIONABLE RESULTS, RECOMMEND RECOLLECT TO VERIFY 2.0* 2.2*   No results found for this basename: LIPASE, AMYLASE,  in the last 168 hours No results found for this basename: AMMONIA,  in the last 168 hours CBC:  Recent Labs Lab 02/11/14 0022  02/12/14 0430 02/13/14 0333 02/14/14 0317 02/15/14 0329 02/17/14 0447  WBC 9.2  < > 6.0 4.9 5.9 11.6* 20.1*  NEUTROABS 8.1*  --   --  3.5  --   --   --   HGB 10.2*  < > 7.8* 7.7* 7.2* 8.6* 8.4*  HCT 31.0*  < > 24.2* 23.1* 22.2* 27.0* 25.7*  MCV 89.1  < > 91.3 90.2 90.2 90.3 90.5  PLT 255  < > 265 247 243 332 318  < > = values in this interval not displayed. Cardiac Enzymes:  Recent Labs Lab 02/11/14 1945 02/11/14 2109 02/12/14 0430 02/12/14 1126  TROPONINI QUESTIONABLE RESULTS, RECOMMEND RECOLLECT TO VERIFY <0.30 <0.30 <0.30    BNP (last 3 results) No results found for this basename: PROBNP,  in the last 8760 hours CBG:  Recent Labs Lab 02/16/14 0723 02/16/14 1211 02/16/14 1651 02/16/14 2231 02/17/14 0730  GLUCAP 98 121* 132* 91 94    Radiological Exams on Admission: No results found.  EKG: Independently reviewed- T wave abnormality   Assessment/Plan Active Problems:   Perforated sigmoid colon   Septic shock(785.52)   AKI   Leukocytosis  Acute kidney Injury Likely due to hypotension, agree with starting IV fluids at 100 ml/hr. Will check renal ultrasound, urine creatinine, urine sodium. Will check BMP in am. Vancomycin on hold.  Leukocytosis ? Cause, likely due to steroids. Will check CXR, UA. She is afebrile. Vancomycin is on hold due to renal failure.  Hypokalemia Will give one dose of Kdur 40 meq po x 1. Will also check magnesium level.   Time Spent on Admission: 65 min  [facility] Pager: [phone] 02/17/2014, 1:00 PM  If 7PM-7AM, please contact night-coverage  [email or web address]  Password [email or web address]

## 2014-02-17 NOTE — Progress Notes (Signed)
ANTIBIOTIC CONSULT NOTE - Follow up  Pharmacy Consult for Vancomycin Indication: rule out sepsis  Allergies  Allergen Reactions  . Codeine Sulfate Nausea And Vomiting  . Tramadol Nausea Only    Nausea     Patient Measurements: Height: 5\' 6"  (167.6 cm) Weight: 193 lb 9 oz (87.8 kg) IBW/kg (Calculated) : 59.3  Vital Signs: Temp: 97.6 F (36.4 C) (05/22 0600) Temp src: Oral (05/22 0600) BP: 128/78 mmHg (05/22 0600) Pulse Rate: 67 (05/22 0600) Intake/Output from previous day: 05/21 0701 - 05/22 0700 In: 890.7 [P.O.:480; I.V.:110.7; IV Piggyback:300] Out: 6834 [Urine:1200; Stool:450] Intake/Output from this shift: Total I/O In: 240 [P.O.:240] Out: 350 [Urine:350]  Labs:  Recent Labs  02/15/14 0329 02/16/14 0455 02/17/14 0447  WBC 11.6*  --  20.1*  HGB 8.6*  --  8.4*  PLT 332  --  318  CREATININE 1.05 0.98 2.11*   Estimated Creatinine Clearance: 29.3 ml/min (by C-G formula based on Cr of 2.11).  Recent Labs  02/14/14 2107 02/17/14 0955  VANCOTROUGH 16.2  --   VANCORANDOM  --  33.6     Microbiology: Recent Results (from the past 720 hour(s))  ANAEROBIC CULTURE     Status: None   Collection Time    02/11/14  8:19 AM      Result Value Ref Range Status   Specimen Description ABSCESS LEFT COLON   Final   Special Requests NONE   Final   Gram Stain     Final   Value: ABUNDANT WBC PRESENT,BOTH PMN AND MONONUCLEAR     NO SQUAMOUS EPITHELIAL CELLS SEEN     MODERATE GRAM POSITIVE COCCI     IN PAIRS     Performed at [facility]   Culture     Final   Value: NO ANAEROBES ISOLATED; CULTURE IN PROGRESS FOR 5 DAYS     Performed at [facility]   Report Status PENDING   Incomplete  CULTURE, ROUTINE-ABSCESS     Status: None   Collection Time    02/11/14  8:19 AM      Result Value Ref Range Status   Specimen Description ABSCESS LEFT COLON   Final   Special Requests NONE   Final   Gram Stain     Final   Value: ABUNDANT WBC PRESENT, PREDOMINANTLY  PMN     NO SQUAMOUS EPITHELIAL CELLS SEEN     MODERATE GRAM POSITIVE COCCI     IN PAIRS     Performed at [facility]   Culture     Final   Value: MULTIPLE ORGANISMS PRESENT, NONE PREDOMINANT     Note: NO STAPHYLOCOCCUS AUREUS ISOLATED NO GROUP A STREP (S.PYOGENES) ISOLATED     Performed at [facility]   Report Status 02/15/2014 FINAL   Final  CULTURE, BLOOD (ROUTINE X 2)     Status: None   Collection Time    02/11/14  9:10 PM      Result Value Ref Range Status   Specimen Description BLOOD LEFT FOREARM   Final   Special Requests BOTTLES DRAWN AEROBIC ONLY 3CC   Final   Culture  Setup Time     Final   Value: 02/12/2014 03:02     Performed at [facility]   Culture     Final   Value:        BLOOD CULTURE RECEIVED NO GROWTH TO DATE CULTURE WILL BE HELD FOR 5 DAYS BEFORE ISSUING A FINAL NEGATIVE REPORT  Performed at [facility]   Report Status PENDING   Incomplete  CULTURE, BLOOD (ROUTINE X 2)     Status: None   Collection Time    02/11/14  9:15 PM      Result Value Ref Range Status   Specimen Description BLOOD RIGHT HAND   Final   Special Requests BOTTLES DRAWN AEROBIC ONLY 3CC   Final   Culture  Setup Time     Final   Value: 02/12/2014 03:02     Performed at [facility]   Culture     Final   Value:        BLOOD CULTURE RECEIVED NO GROWTH TO DATE CULTURE WILL BE HELD FOR 5 DAYS BEFORE ISSUING A FINAL NEGATIVE REPORT     Performed at [facility]   Report Status PENDING   Incomplete    Assessment: 68 y/o woman with T1Nx R breast CA s/p chemo and R partial mastectomy (3/15) admitted with pneumoperitoneum due to perforated colonic divertculi with surrounding abscess. Underwent emergent laparotomy and colon resection with colostomy placement on 02/11/14  5/16 >> Micafungin >> 5/21 5/16 >> Vanc >> 5/22 5/16 >> Zosyn >>  Tmax: AF WBCs: increased to 20.1 (neulasta 5/12, steroids) Renal: SCr 0.98->2.11 over 24hrs, 29CG.     5/16 blood x2: ngtd 5/16 UA: neg 5/16 abscess: mult organisms, none predominant (no Staph aureus, no Group A Strep); no anaerobes isolated (IP)  Dose changes/drug level info:  5/19 2100 VT: 16.2 mcg/ml on 1g q12h 5/22 Vanc stopped by MD d/t elev SCr. Random Vanc: 33.6 drawn ~11 hours after dose.  5/23 Random Vanc:   Goal of Therapy:  Vancomycin trough level 15-20 mcg/ml  Plan:   No Vanc. It's not clear if is permanently dc'd or not. (Please dc Vanc consult if it won't be resumed.)  Will check Vanc level in the am to assess clearance.  Remains on Zosyn 3.375g IV Q8H infused over 4hrs. Will need to be adjusted if SCr rises further. We will follow.  Romeo Rabon, PharmD, pager [phone]. 02/17/2014,1:10 PM.

## 2014-02-17 NOTE — Progress Notes (Signed)
Physical Therapy Treatment Patient Details Name: Martha Evans MRN: [id number] DOB: March 20, 1946 Today's Date: 02/17/2014    History of Present Illness pt admitted 02/10/14 with perforated colon with abcess, s/p exploratory lap, colostomy, wound VAC,postop setic shock, hypotension. Pt has recnt h/p of R breast cancer/mastectomy in 3/15.    PT Comments    Pt OOB in recliner having had just returned from Radiology for an X Ray, she stated she was too fatigued and dizzy to attempt walking but did agree to transfer from recliner to and from [facility].  Pt required increased time and assist for safety.  Follow Up Recommendations        Equipment Recommendations       Recommendations for Other Services       Precautions / Restrictions Precautions Precautions: Fall Precaution Comments: VAC abdomen    Mobility  Bed Mobility               General bed mobility comments: Pt OOB in recliner  Transfers Overall transfer level: Needs assistance Equipment used: None Transfers: Sit to/from Stand;Stand Pivot Transfers Sit to Stand: Min guard Stand pivot transfers: Min guard       General transfer comment: to and from [facility].  Caution to lines/tubes  Ambulation/Gait             General Gait Details: Pt was unable to tolerate at this time having just returned from Radiaology   Stairs            Wheelchair Mobility    Modified Rankin (Stroke Patients Only)       Balance                                    Cognition                            Exercises      General Comments        Pertinent Vitals/Pain     Home Living                      Prior Function            PT Goals (current goals can now be found in the care plan section) Progress towards PT goals: Progressing toward goals    Frequency       PT Plan      Co-evaluation             End of Session           Time: 9242-6834 PT Time Calculation  (min): 10 min  Charges:  $Therapeutic Activity: 8-22 mins                    G Codes:       Rica Koyanagi  PTA [facility]  Acute  Rehab Pager      [phone]

## 2014-02-17 NOTE — Care Management Note (Signed)
    Page 1 of 2   02/24/2014     10:41:40 AM CARE MANAGEMENT NOTE 02/24/2014  Patient:  Martha Evans, Martha Evans   Account Number:  [id number]  Date Initiated:  02/14/2014  Documentation initiated by:  DAVIS,RHONDA  Subjective/Objective Assessment:   pod 3, patient transferred to icu due to post op hypotension and required iv neo drip     Action/Plan:   Home when stable/   Anticipated DC Date:  02/24/2014   Anticipated DC Plan:  [place]  In-house referral  NA      DC Planning Services  CM consult      [facility] Choice  [place]   Choice offered to / List presented to:  C-1 Patient   DME arranged  [facility]      DME agency  [facility]     [place] arranged  HH-1 RN  [place].   Status of service:  Completed, signed off Medicare Important Message given?  YES (If response is "NO", the following Medicare IM given date fields will be blank) Date Medicare IM given:  02/22/2014 Date Additional Medicare IM given:    Discharge Disposition:  [place]  Per UR Regulation:  Reviewed for med. necessity/level of care/duration of stay  If discussed at [place] of Stay Meetings, dates discussed:   02/16/2014  02/23/2014    Comments:  02-24-14 [place] Patient chose [facility] for [facility] services, wound vac at d/c and equipment delivered and in room. [facility] aware of d/c and need for labs.  02-17-14 Sunday Spillers RN CM 1500 Spoke with patient at bedside. Discussed d/c plans and need for [facility] services. Provided patient with list of [place] agencies for choice, also provided private duty list. Discussed difference between skilled and custodial care. Patient indicated that she had long term care insurance and interested in some assistance with home care. Will f/u with patient at the beginning of next week.  [place], RN, BSN, CCM [phone] Chart Reviewed for discharge and  hospital needs. Discharge needs at time of review: None present will follow for needs. Review of patient progress due on [id number].

## 2014-02-18 ENCOUNTER — Inpatient Hospital Stay (HOSPITAL_COMMUNITY): Payer: Medicare Other

## 2014-02-18 LAB — BASIC METABOLIC PANEL
BUN: 22 mg/dL (ref 6–23)
CO2: 26 meq/L (ref 19–32)
CREATININE: 3 mg/dL — AB (ref 0.50–1.10)
Calcium: 8.3 mg/dL — ABNORMAL LOW (ref 8.4–10.5)
Chloride: 104 mEq/L (ref 96–112)
GFR calc Af Amer: 18 mL/min — ABNORMAL LOW (ref >90)
GFR, EST NON AFRICAN AMERICAN: 15 mL/min — AB (ref >90)
GLUCOSE: 99 mg/dL (ref 70–99)
Potassium: 3.6 mEq/L — ABNORMAL LOW (ref 3.7–5.3)
Sodium: 142 mEq/L (ref 137–147)

## 2014-02-18 LAB — ANAEROBIC CULTURE

## 2014-02-18 LAB — CBC
HCT: 24.4 % — ABNORMAL LOW (ref 36.0–46.0)
Hemoglobin: 8.1 g/dL — ABNORMAL LOW (ref 12.0–15.0)
MCH: 30.2 pg (ref 26.0–34.0)
MCHC: 33.2 g/dL (ref 30.0–36.0)
MCV: 91 fL (ref 78.0–100.0)
PLATELETS: 291 10*3/uL (ref 150–400)
RBC: 2.68 MIL/uL — AB (ref 3.87–5.11)
RDW: 14.3 % (ref 11.5–15.5)
WBC: 23 10*3/uL — AB (ref 4.0–10.5)

## 2014-02-18 LAB — CULTURE, BLOOD (ROUTINE X 2)
Culture: NO GROWTH
Culture: NO GROWTH

## 2014-02-18 LAB — VANCOMYCIN, RANDOM: VANCOMYCIN RM: 23.8 ug/mL

## 2014-02-18 LAB — MAGNESIUM: Magnesium: 2.2 mg/dL (ref 1.5–2.5)

## 2014-02-18 LAB — GLUCOSE, CAPILLARY
GLUCOSE-CAPILLARY: 109 mg/dL — AB (ref 70–99)
Glucose-Capillary: 90 mg/dL (ref 70–99)
Glucose-Capillary: 134 mg/dL — ABNORMAL HIGH (ref 70–99)
Glucose-Capillary: 125 mg/dL — ABNORMAL HIGH (ref 70–99)

## 2014-02-18 LAB — URINE CULTURE
Colony Count: NO GROWTH
Culture: NO GROWTH

## 2014-02-18 MED ORDER — PIPERACILLIN-TAZOBACTAM IN DEX 2-0.25 GM/50ML IV SOLN
2.2500 g | Freq: Three times a day (TID) | INTRAVENOUS | Status: DC
  Administered 2014-02-18 – 2014-02-21 (×10): 2.25 g via INTRAVENOUS
  Filled 2014-02-18 (×10): qty 50

## 2014-02-18 MED ORDER — LINEZOLID 2 MG/ML IV SOLN
600.0000 mg | Freq: Two times a day (BID) | INTRAVENOUS | Status: DC
  Administered 2014-02-18 – 2014-02-22 (×10): 600 mg via INTRAVENOUS
  Filled 2014-02-18 (×14): qty 300

## 2014-02-18 MED ORDER — SODIUM CHLORIDE 0.9 % IJ SOLN
10.0000 mL | INTRAMUSCULAR | Status: DC | PRN
  Administered 2014-02-18 – 2014-02-20 (×6): 10 mL
  Administered 2014-02-21: 20 mL
  Administered 2014-02-22 – 2014-02-24 (×2): 10 mL

## 2014-02-18 NOTE — Progress Notes (Signed)
Drainage in wound vac pink. Wound vac removed for change. Upper half at and above retention sutures of wound in skin fold oozing blood. Wet to dry dressing applied. Dr Lucia Gaskins paged and notified

## 2014-02-18 NOTE — Progress Notes (Signed)
Patient was sitting in the chair and in the same good spirits, leastwise on the outside.  Husband was present, appearing stressed.  Patient said she had a setback today, and stated "two steps forward, one step back."  Her outlook seems to remain positive, but she was somewhat discouraged.  Chaplain made a point of noticing her in the chair and still able to smile.  Husband asked me to walk with him.  He was concerned because it appears her kidneys are not functioning well.  He stated they will attempt to flush them to open them up, and hopefully that is all there is.  He was feeling really negative about the situation, although he was apparently well-informed of this occurring in his wife's situation.  He has been very concerned since the first meeting, and likely upon his wife's entrance into the hospital, though he won't admit to being anxious.  The word to him implies lack of control and/or weakness.  The Chaplain was real with the husband, speaking of both the worst and the best, and additionally reminding him that she may still not be cancer free, even though the entire tumor was extracted.  He was comfortable with the conversation, encouraged with the Chaplain's presence, especially unasked, and looked forward to the Chaplain coming again.    This patient should be followed by the other Chaplains on staff at [facility], as the husband does need to be able to give voice to what's happening from his perspective, and will likely be here after this Chaplain's week long shift is over.  The wife (patient) also needs encouragement, affirmation and support.  Loann Quill, Chaplain Pager: [phone]

## 2014-02-18 NOTE — Progress Notes (Signed)
ANTIBIOTIC CONSULT NOTE - INITIAL  Pharmacy Consult for Zosyn Indication: abdominal infection  Allergies  Allergen Reactions  . Codeine Sulfate Nausea And Vomiting  . Tramadol Nausea Only    Nausea   . Hydrocodone Nausea Only  . Oxycodone Nausea Only    Patient Measurements: Height: 5\' 6"  (167.6 cm) Weight: 193 lb 9 oz (87.8 kg) IBW/kg (Calculated) : 59.3 Adjusted Body Weight: 70.7kg   Vital Signs: Temp: 97.8 F (36.6 C) (05/23 0616) Temp src: Oral (05/23 0616) BP: 126/76 mmHg (05/23 0616) Pulse Rate: 73 (05/23 0616) Intake/Output from previous day: 05/22 0701 - 05/23 0700 In: 2963.3 [P.O.:960; I.V.:2003.3] Out: 1100 [Urine:975; Stool:125]  Labs:  Recent Labs  02/17/14 0447 02/17/14 1350 02/18/14 0505  WBC 20.1*  --  23.0*  HGB 8.4*  --  8.1*  PLT 318  --  291  CREATININE 2.11* 2.54* 3.00*   Estimated Creatinine Clearance: 20.6 ml/min (by C-G formula based on Cr of 3).  Recent Labs  02/17/14 0955  VANCORANDOM 33.6    Medical History: Past Medical History  Diagnosis Date  . Hypertension   . Allergy   . Wears hearing aid     right  . GERD (gastroesophageal reflux disease)   . Complication of anesthesia     was hard to intubate with thyroid surgery-99  . Difficult intubation 1999    when had thyroid lobectomy  . Dysrhythmia     irregular reuglar heart beat   . Cancer     breast cancer     Medications:  Scheduled:  . feeding supplement (ENSURE COMPLETE)  237 mL Oral BID BM  . feeding supplement (ENSURE)  1 Container Oral TID BM  . heparin subcutaneous  5,000 Units Subcutaneous Q8H  . hydrocortisone sod succinate (SOLU-CORTEF) inj  12.5 mg Intravenous Daily  . insulin aspart  0-15 Units Subcutaneous TID WC  . pantoprazole (PROTONIX) IV  40 mg Intravenous QHS  . piperacillin-tazobactam (ZOSYN)  IV  3.375 g Intravenous Q8H   Infusions:  . sodium chloride 10 mL/hr at 02/16/14 1853  . sodium chloride 100 mL/hr at 02/18/14 0541   Assessment: 67  y/oF admitted 5/15 with pneumoperitoneum due to perforated colonic divertculi with surrounding abscess. Underwent emergent laparotomy and colon resection with colostomy placement on 02/11/14. Hx breast CA s/p chemo and R partial mastectomy (3/15). Patient well-known to pharmacy for vancomycin dosing, now being consulted to dose Zosyn due to acute renal failure.   Patient is s/p 7 days of Zosyn per MD, 5 days micafungin per MD, and 7 days vancomycin per pharmacy  Last dose Zosyn given at 0229  SCR rising suddenly but patient still making urine  Antiinfectives 5/16 >> Micafungin >> 5/20 5/16 >> Vanc >> 5/22 5/16 >> Zosyn >>  Labs / vitals Tmax: remains afebrile WBCs: increased to 23 (neulasta 5/12, steroids) Renal: ARF, SCr up to 3.0 (0.8 on admit), 20CG, UOP 0.5/kg/hr  Microbiology 5/16 blood x2: ngtd 5/16 UA: neg 5/16 abscess: mult organisms, none predominant (no Staph aureus, no Group A Strep); no anaerobes isolated (IP) 5/22 urine: sent  Dose changes/drug level info:  5/19 2100 VT: 16.2 mcg/ml on 1g q12h 5/22 Vanc stopped by MD d/t elev SCr. Random Vanc: 33.6 drawn ~11 hours after dose.  5/23 0500 Random Vanc: in process  Goal of Therapy:  Zosyn per indication and renal function  Plan:  - decrease Zosyn to 2.25g IV q8h with decreased renal function  - follow- up renal function for dose adjustment,  bmet in AM - follow-up clinical status, culture results  Thank you for the consult.  Johny Drilling, PharmD, BCPS Pager: [phone] Pharmacy: [phone] 02/18/2014 7:56 AM

## 2014-02-18 NOTE — Progress Notes (Signed)
Patient was in bed and her husband was present.  No "news" from them.  Chaplain engaged in some general conversation.  It was good for the patient to take a break and be involved in the world still going on.  We joked about the husband eating Doritos, which have a strong odor.  The patient was a bit sleepy.  The Chaplain again affirmed the patient positive and accepting outlook.  He affirmed the husband in his place as husband.  He seemed more relaxed tonight than ever before on previous meetings.  Patient is still likely to be here at least till Tuesday, from what is in the files.  The SW is assisting with home assistance for her when she goes home.  Husband is appreciative of the repeat visits of the Chaplain.  Loann Quill, Chaplain Pager: [phone]

## 2014-02-18 NOTE — Progress Notes (Signed)
[facility] PROGRESS NOTE  Martha Evans HQI:[id number] DOB: 1946/02/03 DOA: 02/10/2014 PCP: [name], MD  Assessment/Plan: Leukocytosis/HCAP -CXR with new LLL patchy infiltrate. -Suspicious for PNA. -Patient describes a mild cough and some SOB. -Will start treatment for HCAP. -Already on zosyn will add zyvox (trying to avoid Vanc given concomitant ARF). Discussed with Dr. Johnnye Sima. -If WBCs continue to rise, will need to consider a CT scan looking for an intraabdominal abscess.  ARF -Cr continues to rise despite IVF. -Renal US ordered to look for obstruction/hydronephrosis. -Hopefully will begin to plateau soon; if not will need to consider a renal consultation. -Continue IVF. -Not on nephrotoxic meds.  Colon Perforation -S/p colectomy with ostomy. -Management as per surgery.  Code Status: Full Code Family Communication: Patient only  Disposition Plan: To be dtermined     Antibiotics:  Zosyn  Zyvox   Subjective: No complaints; mild SOB and cough.  Objective: Filed Vitals:   02/17/14 0600 02/17/14 1420 02/17/14 2150 02/18/14 0616  BP: 128/78 125/75 136/76 126/76  Pulse: 67 78  73  Temp: 97.6 F (36.4 C) 98 F (36.7 C) 98.5 F (36.9 C) 97.8 F (36.6 C)  TempSrc: Oral Oral Oral Oral  Resp: 16 17 18 16   Height:      Weight:      SpO2: 92% 95% 95% 94%    Intake/Output Summary (Last 24 hours) at 02/18/14 1400 Last data filed at 02/18/14 0828  Gross per 24 hour  Intake 2723.33 ml  Output   1050 ml  Net 1673.33 ml   Filed Weights   02/11/14 2115 02/13/14 0002 02/14/14 0400  Weight: 83.462 kg (184 lb) 90.7 kg (199 lb 15.3 oz) 87.8 kg (193 lb 9 oz)    Exam:   General:  AA Ox3  Cardiovascular: RRR  Respiratory: CTA B  Abdomen: S/NT/ND/+BS  Extremities: 1+edema bilaterally   Neurologic:  Non-focal  Data Reviewed: Basic Metabolic Panel:  Recent Labs Lab 02/11/14 1945 02/11/14 2109  02/14/14 0317 02/15/14 0329  02/16/14 0455 02/17/14 0447 02/17/14 1350 02/18/14 0020 02/18/14 0505  NA QUESTIONABLE RESULTS, RECOMMEND RECOLLECT TO VERIFY 136*  < > 140 144 145 140 140  --  142  K QUESTIONABLE RESULTS, RECOMMEND RECOLLECT TO VERIFY 4.1  < > 3.3* 3.2* 3.0* 3.1* 3.4*  --  3.6*  CL QUESTIONABLE RESULTS, RECOMMEND RECOLLECT TO VERIFY 100  < > 104 105 104 103 99  --  104  CO2 QUESTIONABLE RESULTS, RECOMMEND RECOLLECT TO VERIFY 26  < > 26 30 31 27 27   --  26  GLUCOSE QUESTIONABLE RESULTS, RECOMMEND RECOLLECT TO VERIFY 165*  < > 109* 87 100* 97 154*  --  99  BUN QUESTIONABLE RESULTS, RECOMMEND RECOLLECT TO VERIFY 15  < > 18 22 17 18 20   --  22  CREATININE QUESTIONABLE RESULTS, RECOMMEND RECOLLECT TO VERIFY 0.60  < > 0.72 1.05 0.98 2.11* 2.54*  --  3.00*  CALCIUM QUESTIONABLE RESULTS, RECOMMEND RECOLLECT TO VERIFY 7.6*  < > 8.4 8.7 8.4 8.6 8.8  --  8.3*  MG QUESTIONABLE RESULTS, RECOMMEND RECOLLECT TO VERIFY 1.9  --  2.4  --  2.2  --   --  2.2  --   PHOS QUESTIONABLE RESULTS, RECOMMEND RECOLLECT TO VERIFY 3.0  --  2.6  --   --   --   --   --   --   < > = values in this interval not displayed. Liver Function Tests:  Recent Labs Lab  02/11/14 1945 02/11/14 2109 02/15/14 0329  AST QUESTIONABLE RESULTS, RECOMMEND RECOLLECT TO VERIFY 19 19  ALT QUESTIONABLE RESULTS, RECOMMEND RECOLLECT TO VERIFY 29 21  ALKPHOS QUESTIONABLE RESULTS, RECOMMEND RECOLLECT TO VERIFY 96 107  BILITOT QUESTIONABLE RESULTS, RECOMMEND RECOLLECT TO VERIFY 1.7* 0.6  PROT QUESTIONABLE RESULTS, RECOMMEND RECOLLECT TO VERIFY 5.1* 5.5*  ALBUMIN QUESTIONABLE RESULTS, RECOMMEND RECOLLECT TO VERIFY 2.0* 2.2*   No results found for this basename: LIPASE, AMYLASE,  in the last 168 hours No results found for this basename: AMMONIA,  in the last 168 hours CBC:  Recent Labs Lab 02/13/14 0333 02/14/14 0317 02/15/14 0329 02/17/14 0447 02/18/14 0505  WBC 4.9 5.9 11.6* 20.1* 23.0*  NEUTROABS 3.5  --   --   --   --   HGB 7.7* 7.2* 8.6* 8.4*  8.1*  HCT 23.1* 22.2* 27.0* 25.7* 24.4*  MCV 90.2 90.2 90.3 90.5 91.0  PLT 247 243 332 318 291   Cardiac Enzymes:  Recent Labs Lab 02/11/14 1945 02/11/14 2109 02/12/14 0430 02/12/14 1126  TROPONINI QUESTIONABLE RESULTS, RECOMMEND RECOLLECT TO VERIFY <0.30 <0.30 <0.30   BNP (last 3 results) No results found for this basename: PROBNP,  in the last 8760 hours CBG:  Recent Labs Lab 02/17/14 0730 02/17/14 1749 02/17/14 2220 02/18/14 0741 02/18/14 1223  GLUCAP 94 182* 123* 90 134*    Recent Results (from the past 240 hour(s))  ANAEROBIC CULTURE     Status: None   Collection Time    02/11/14  8:19 AM      Result Value Ref Range Status   Specimen Description ABSCESS LEFT COLON   Final   Special Requests NONE   Final   Gram Stain     Final   Value: ABUNDANT WBC PRESENT,BOTH PMN AND MONONUCLEAR     NO SQUAMOUS EPITHELIAL CELLS SEEN     MODERATE GRAM POSITIVE COCCI     IN PAIRS     Performed at [facility]   Culture     Final   Value: NO ANAEROBES ISOLATED; CULTURE IN PROGRESS FOR 5 DAYS     Performed at [facility]   Report Status PENDING   Incomplete  CULTURE, ROUTINE-ABSCESS     Status: None   Collection Time    02/11/14  8:19 AM      Result Value Ref Range Status   Specimen Description ABSCESS LEFT COLON   Final   Special Requests NONE   Final   Gram Stain     Final   Value: ABUNDANT WBC PRESENT, PREDOMINANTLY PMN     NO SQUAMOUS EPITHELIAL CELLS SEEN     MODERATE GRAM POSITIVE COCCI     IN PAIRS     Performed at [facility]   Culture     Final   Value: MULTIPLE ORGANISMS PRESENT, NONE PREDOMINANT     Note: NO STAPHYLOCOCCUS AUREUS ISOLATED NO GROUP A STREP (S.PYOGENES) ISOLATED     Performed at [facility]   Report Status 02/15/2014 FINAL   Final  CULTURE, BLOOD (ROUTINE X 2)     Status: None   Collection Time    02/11/14  9:10 PM      Result Value Ref Range Status   Specimen Description BLOOD LEFT FOREARM   Final    Special Requests BOTTLES DRAWN AEROBIC ONLY 3CC   Final   Culture  Setup Time     Final   Value: 02/12/2014 03:02     Performed at [facility]   Culture     Final   Value: NO GROWTH 5 DAYS     Performed at [facility]   Report Status 02/18/2014 FINAL   Final  CULTURE, BLOOD (ROUTINE X 2)     Status: None   Collection Time    02/11/14  9:15 PM      Result Value Ref Range Status   Specimen Description BLOOD RIGHT HAND   Final   Special Requests BOTTLES DRAWN AEROBIC ONLY 3CC   Final   Culture  Setup Time     Final   Value: 02/12/2014 03:02     Performed at [facility]   Culture     Final   Value: NO GROWTH 5 DAYS     Performed at [facility]   Report Status 02/18/2014 FINAL   Final     Studies: Dg Chest 2 View  02/17/2014   CLINICAL DATA:  Dyspnea with exertion ; leukocytosis  EXAM: CHEST  2 VIEW  COMPARISON:  Feb 12, 2014  FINDINGS: Port-A-Cath tip is in the superior vena cava. No pneumothorax. There is patchy infiltrate in the left base. Lungs elsewhere clear. Heart is upper normal in size with normal pulmonary vascularity. No adenopathy. There is postoperative change in the right neck region.  IMPRESSION: Patchy infiltrate left base. Lungs elsewhere clear. No pneumothorax.   Electronically Signed   By: Lowella Grip M.D.   On: 02/17/2014 14:18    Scheduled Meds: . feeding supplement (ENSURE COMPLETE)  237 mL Oral BID BM  . feeding supplement (ENSURE)  1 Container Oral TID BM  . heparin subcutaneous  5,000 Units Subcutaneous Q8H  . insulin aspart  0-15 Units Subcutaneous TID WC  . linezolid  600 mg Intravenous Q12H  . pantoprazole (PROTONIX) IV  40 mg Intravenous QHS  . piperacillin-tazobactam (ZOSYN)  IV  2.25 g Intravenous Q8H   Continuous Infusions: . sodium chloride 10 mL/hr at 02/16/14 1853  . sodium chloride 100 mL/hr at 02/18/14 0541    Active Problems:   Perforated sigmoid colon   Septic shock(785.52)   AKI (acute kidney  injury)   Leukocytosis, unspecified    Time spent: 35 minutes. Greater than 50% of this time was spent in direct contact with the patient coordinating care.    [facility] Pager [phone]  If 7PM-7AM, please contact night-coverage at [email or web address], password [facility] 02/18/2014, 2:00 PM  LOS: 8 days

## 2014-02-18 NOTE — Progress Notes (Signed)
7 Days Post-Op  Subjective: No complaints. Feels a little better.  Objective: Vital signs in last 24 hours: Temp:  [97.8 F (36.6 C)-98.5 F (36.9 C)] 97.8 F (36.6 C) (05/23 0616) Pulse Rate:  [73-78] 73 (05/23 0616) Resp:  [16-18] 16 (05/23 0616) BP: (125-136)/(75-76) 126/76 mmHg (05/23 0616) SpO2:  [94 %-95 %] 94 % (05/23 0616)    Intake/Output from previous day: 05/22 0701 - 05/23 0700 In: 2963.3 [P.O.:960; I.V.:2003.3] Out: 1100 [Urine:975; Stool:125] Intake/Output this shift:    Resp: clear to auscultation bilaterally Cardio: regular rate and rhythm GI: soft, nontender. ostomy productive. wound vac intact and clean  Lab Results:   Recent Labs  02/17/14 0447 02/18/14 0505  WBC 20.1* 23.0*  HGB 8.4* 8.1*  HCT 25.7* 24.4*  PLT 318 291   BMET  Recent Labs  02/17/14 1350 02/18/14 0505  NA 140 142  K 3.4* 3.6*  CL 99 104  CO2 27 26  GLUCOSE 154* 99  BUN 20 22  CREATININE 2.54* 3.00*  CALCIUM 8.8 8.3*   PT/INR No results found for this basename: LABPROT, INR,  in the last 72 hours ABG No results found for this basename: PHART, PCO2, PO2, HCO3,  in the last 72 hours  Studies/Results: Dg Chest 2 View  02/17/2014   CLINICAL DATA:  Dyspnea with exertion ; leukocytosis  EXAM: CHEST  2 VIEW  COMPARISON:  Feb 12, 2014  FINDINGS: Port-A-Cath tip is in the superior vena cava. No pneumothorax. There is patchy infiltrate in the left base. Lungs elsewhere clear. Heart is upper normal in size with normal pulmonary vascularity. No adenopathy. There is postoperative change in the right neck region.  IMPRESSION: Patchy infiltrate left base. Lungs elsewhere clear. No pneumothorax.   Electronically Signed   By: Lowella Grip M.D.   On: 02/17/2014 14:18    Anti-infectives: Anti-infectives   Start     Dose/Rate Route Frequency Ordered Stop   02/12/14 1000  vancomycin (VANCOCIN) IVPB 1000 mg/200 mL premix  Status:  Discontinued     1,000 mg 200 mL/hr over 60 Minutes  Intravenous Every 12 hours 02/12/14 0537 02/17/14 0633   02/11/14 2130  vancomycin (VANCOCIN) 1,250 mg in sodium chloride 0.9 % 250 mL IVPB     1,250 mg 166.7 mL/hr over 90 Minutes Intravenous  Once 02/11/14 2117 02/11/14 2310   02/11/14 2030  micafungin (MYCAMINE) 100 mg in sodium chloride 0.9 % 100 mL IVPB  Status:  Discontinued     100 mg 100 mL/hr over 1 Hours Intravenous Daily at bedtime 02/11/14 1939 02/16/14 0805   02/11/14 1800  piperacillin-tazobactam (ZOSYN) IVPB 3.375 g     3.375 g 12.5 mL/hr over 240 Minutes Intravenous Every 8 hours 02/11/14 1150     02/11/14 0330  piperacillin-tazobactam (ZOSYN) IVPB 3.375 g  Status:  Discontinued     3.375 g 12.5 mL/hr over 240 Minutes Intravenous  Once 02/11/14 0318 02/11/14 0320   02/11/14 0330  piperacillin-tazobactam (ZOSYN) IVPB 3.375 g     3.375 g 100 mL/hr over 30 Minutes Intravenous  Once 02/11/14 0321 02/11/14 0410      Assessment/Plan: s/p Procedure(s): EXPLORATORY LAPAROTOMY, COLON RESECTION, COLOSTOMY (N/A) continue with fulls for now Renal u/s this am. Cr still rising although she is still making urine. Could be secondary to vanc or hypotension when she presented. Medicine evaluating WBC still up. May need CT abd/pelvis if this continues to rule out abscess  LOS: 8 days    Luella Cook III  02/18/2014  

## 2014-02-19 ENCOUNTER — Inpatient Hospital Stay (HOSPITAL_COMMUNITY): Payer: Medicare Other

## 2014-02-19 LAB — CBC WITH DIFFERENTIAL/PLATELET
BASOS ABS: 0.3 10*3/uL — AB (ref 0.0–0.1)
Basophils Relative: 1 % (ref 0–1)
EOS ABS: 0 10*3/uL (ref 0.0–0.7)
Eosinophils Relative: 0 % (ref 0–5)
HCT: 25.6 % — ABNORMAL LOW (ref 36.0–46.0)
Hemoglobin: 8.3 g/dL — ABNORMAL LOW (ref 12.0–15.0)
LYMPHS PCT: 6 % — AB (ref 12–46)
Lymphs Abs: 1.5 10*3/uL (ref 0.7–4.0)
MCH: 29.6 pg (ref 26.0–34.0)
MCHC: 32.4 g/dL (ref 30.0–36.0)
MCV: 91.4 fL (ref 78.0–100.0)
MONOS PCT: 5 % (ref 3–12)
Monocytes Absolute: 1.3 10*3/uL — ABNORMAL HIGH (ref 0.1–1.0)
NEUTROS ABS: 22.5 10*3/uL — AB (ref 1.7–7.7)
NEUTROS PCT: 88 % — AB (ref 43–77)
Platelets: 272 10*3/uL (ref 150–400)
RBC: 2.8 MIL/uL — ABNORMAL LOW (ref 3.87–5.11)
RDW: 14.7 % (ref 11.5–15.5)
WBC: 25.6 10*3/uL — AB (ref 4.0–10.5)

## 2014-02-19 LAB — BASIC METABOLIC PANEL
BUN: 19 mg/dL (ref 6–23)
CO2: 26 mEq/L (ref 19–32)
Calcium: 8.5 mg/dL (ref 8.4–10.5)
Chloride: 104 mEq/L (ref 96–112)
Creatinine, Ser: 3.02 mg/dL — ABNORMAL HIGH (ref 0.50–1.10)
GFR calc non Af Amer: 15 mL/min — ABNORMAL LOW (ref >90)
GFR, EST AFRICAN AMERICAN: 17 mL/min — AB (ref >90)
Glucose, Bld: 89 mg/dL (ref 70–99)
POTASSIUM: 3.6 meq/L — AB (ref 3.7–5.3)
SODIUM: 141 meq/L (ref 137–147)

## 2014-02-19 LAB — GLUCOSE, CAPILLARY
GLUCOSE-CAPILLARY: 110 mg/dL — AB (ref 70–99)
GLUCOSE-CAPILLARY: 130 mg/dL — AB (ref 70–99)
Glucose-Capillary: 97 mg/dL (ref 70–99)

## 2014-02-19 MED ORDER — IOHEXOL 300 MG/ML  SOLN
50.0000 mL | Freq: Once | INTRAMUSCULAR | Status: AC | PRN
  Administered 2014-02-19: 50 mL via ORAL

## 2014-02-19 NOTE — Plan of Care (Signed)
Problem: Phase I Progression Outcomes Goal: If new ostomy, consult WOC nurse Outcome: Completed/Met Date Met:  02/19/14 To see pt 5/25

## 2014-02-19 NOTE — Progress Notes (Signed)
Patient was in her chair.  Her husband was not present this visit.  We talked about her road to whatever level of health she can achieve, her experiences with some great shows of friendship loyalty, all the drugs she taking, her nausea, her emotions and her acceptance of where she's at.  When her lunch came, somewhat later than usual, she chatted with the person who brought it up, and the Chaplain said his good-byes.  Loann Quill, Chaplain Pager: [phone]

## 2014-02-19 NOTE — Progress Notes (Signed)
8 Days Post-Op  Subjective: No complaints. Feels better today  Objective: Vital signs in last 24 hours: Temp:  [97.8 F (36.6 C)-98 F (36.7 C)] 97.8 F (36.6 C) (05/24 0541) Pulse Rate:  [59-65] 59 (05/24 0541) Resp:  [18-20] 20 (05/24 0541) BP: (117-128)/(75-77) 117/77 mmHg (05/24 0541) SpO2:  [91 %-97 %] 91 % (05/24 0541)    Intake/Output from previous day: 05/23 0701 - 05/24 0700 In: 3383.3 [P.O.:600; I.V.:2083.3; IV Piggyback:700] Out: 1300 [Urine:800; Stool:500] Intake/Output this shift:    Resp: clear to auscultation bilaterally Cardio: regular rate and rhythm GI: soft, nontender. open wound clean. ostomy productive  Lab Results:   Recent Labs  02/17/14 0447 02/18/14 0505  WBC 20.1* 23.0*  HGB 8.4* 8.1*  HCT 25.7* 24.4*  PLT 318 291   BMET  Recent Labs  02/18/14 0505 02/19/14 0448  NA 142 141  K 3.6* 3.6*  CL 104 104  CO2 26 26  GLUCOSE 99 89  BUN 22 19  CREATININE 3.00* 3.02*  CALCIUM 8.3* 8.5   PT/INR No results found for this basename: LABPROT, INR,  in the last 72 hours ABG No results found for this basename: PHART, PCO2, PO2, HCO3,  in the last 72 hours  Studies/Results: Dg Chest 2 View  02/17/2014   CLINICAL DATA:  Dyspnea with exertion ; leukocytosis  EXAM: CHEST  2 VIEW  COMPARISON:  Feb 12, 2014  FINDINGS: Port-A-Cath tip is in the superior vena cava. No pneumothorax. There is patchy infiltrate in the left base. Lungs elsewhere clear. Heart is upper normal in size with normal pulmonary vascularity. No adenopathy. There is postoperative change in the right neck region.  IMPRESSION: Patchy infiltrate left base. Lungs elsewhere clear. No pneumothorax.   Electronically Signed   By: Lowella Grip M.D.   On: 02/17/2014 14:18   US Renal  02/18/2014   CLINICAL DATA:  Acute renal failure. History of hysterectomy, hypertension, appendectomy and breast cancer.  EXAM: RENAL/URINARY TRACT ULTRASOUND COMPLETE  COMPARISON:  Abdominal CT 02/11/2014.   FINDINGS: Examination is mildly limited by body habitus.  Right Kidney:  Length: 13.3 cm. There is a 2.3 x 2.5 x 2.1 cm cyst in the lower pole. No hydronephrosis.  Left Kidney:  Length: 12.4 cm. Echogenicity within normal limits. No mass or hydronephrosis visualized.  Bladder:  Appears normal for degree of bladder distention.  Other: Gallstones are noted incidentally.  IMPRESSION: No hydronephrosis or acute findings demonstrated. Right renal cyst and gallstones noted.   Electronically Signed   By: Camie Patience M.D.   On: 02/18/2014 16:51    Anti-infectives: Anti-infectives   Start     Dose/Rate Route Frequency Ordered Stop   02/18/14 1330  linezolid (ZYVOX) IVPB 600 mg     600 mg 300 mL/hr over 60 Minutes Intravenous Every 12 hours 02/18/14 1217     02/18/14 1000  piperacillin-tazobactam (ZOSYN) IVPB 2.25 g     2.25 g 100 mL/hr over 30 Minutes Intravenous Every 8 hours 02/18/14 0759     02/12/14 1000  vancomycin (VANCOCIN) IVPB 1000 mg/200 mL premix  Status:  Discontinued     1,000 mg 200 mL/hr over 60 Minutes Intravenous Every 12 hours 02/12/14 0537 02/17/14 0633   02/11/14 2130  vancomycin (VANCOCIN) 1,250 mg in sodium chloride 0.9 % 250 mL IVPB     1,250 mg 166.7 mL/hr over 90 Minutes Intravenous  Once 02/11/14 2117 02/11/14 2310   02/11/14 2030  micafungin (MYCAMINE) 100 mg in sodium chloride 0.9 %  100 mL IVPB  Status:  Discontinued     100 mg 100 mL/hr over 1 Hours Intravenous Daily at bedtime 02/11/14 1939 02/16/14 0805   02/11/14 1800  piperacillin-tazobactam (ZOSYN) IVPB 3.375 g  Status:  Discontinued     3.375 g 12.5 mL/hr over 240 Minutes Intravenous Every 8 hours 02/11/14 1150 02/18/14 0759   02/11/14 0330  piperacillin-tazobactam (ZOSYN) IVPB 3.375 g  Status:  Discontinued     3.375 g 12.5 mL/hr over 240 Minutes Intravenous  Once 02/11/14 0318 02/11/14 0320   02/11/14 0330  piperacillin-tazobactam (ZOSYN) IVPB 3.375 g     3.375 g 100 mL/hr over 30 Minutes Intravenous  Once  02/11/14 0321 02/11/14 0410      Assessment/Plan: s/p Procedure(s): EXPLORATORY LAPAROTOMY, COLON RESECTION, COLOSTOMY (N/A) Zyvox added to Zosyn for possible pneumonia Continue dressing changes Recheck wbc today. If still elevating then plan for noncontrasted CT of abd Cr stable from yesterday. U/S neg. Hopefully with start to trend down tomorrow  LOS: 9 days    Martha Evans 02/19/2014

## 2014-02-19 NOTE — Progress Notes (Signed)
[facility] PROGRESS NOTE  Martha Evans:[id number] DOB: Oct 13, 1945 DOA: 02/10/2014 PCP: [name], MD  Assessment/Plan: Leukocytosis/HCAP -CXR with LLL patchy infiltrate. -Suspicious for PNA. -Patient describes a mild cough and some SOB. -Treatment for HCAP has been started with zosyn/zyvox. -WBCs increasing. Recommend CT scan of the abdomen.  ARF -Cr seems to be plateauing today. -Hopeful for improvement soon. -Renal US without obstruction/hydronephrosis. -Continue IVF. -Not on nephrotoxic meds.  Colon Perforation -S/p colectomy with ostomy. -Management as per surgery.  Code Status: Full Code Family Communication: Patient only  Disposition Plan: To be determined     Antibiotics:  Zosyn  Zyvox   Subjective: No complaints; mild SOB and cough.  Objective: Filed Vitals:   02/17/14 2150 02/18/14 0616 02/18/14 2133 02/19/14 0541  BP: 136/76 126/76 128/75 117/77  Pulse:  73 65 59  Temp: 98.5 F (36.9 C) 97.8 F (36.6 C) 98 F (36.7 C) 97.8 F (36.6 C)  TempSrc: Oral Oral Oral Oral  Resp: 18 16 18 20   Height:      Weight:      SpO2: 95% 94% 97% 91%    Intake/Output Summary (Last 24 hours) at 02/19/14 1242 Last data filed at 02/19/14 0917  Gross per 24 hour  Intake   3715 ml  Output   1650 ml  Net   2065 ml   Filed Weights   02/11/14 2115 02/13/14 0002 02/14/14 0400  Weight: 83.462 kg (184 lb) 90.7 kg (199 lb 15.3 oz) 87.8 kg (193 lb 9 oz)    Exam:   General:  AA Ox3  Cardiovascular: RRR  Respiratory: CTA B  Abdomen: S/NT/ND/+BS  Extremities: 1+edema bilaterally   Neurologic:  Non-focal  Data Reviewed: Basic Metabolic Panel:  Recent Labs Lab 02/14/14 0317  02/16/14 0455 02/17/14 0447 02/17/14 1350 02/18/14 0020 02/18/14 0505 02/19/14 0448  NA 140  < > 145 140 140  --  142 141  K 3.3*  < > 3.0* 3.1* 3.4*  --  3.6* 3.6*  CL 104  < > 104 103 99  --  104 104  CO2 26  < > 31 27 27   --  26 26  GLUCOSE  109*  < > 100* 97 154*  --  99 89  BUN 18  < > 17 18 20   --  22 19  CREATININE 0.72  < > 0.98 2.11* 2.54*  --  3.00* 3.02*  CALCIUM 8.4  < > 8.4 8.6 8.8  --  8.3* 8.5  MG 2.4  --  2.2  --   --  2.2  --   --   PHOS 2.6  --   --   --   --   --   --   --   < > = values in this interval not displayed. Liver Function Tests:  Recent Labs Lab 02/15/14 0329  AST 19  ALT 21  ALKPHOS 107  BILITOT 0.6  PROT 5.5*  ALBUMIN 2.2*   No results found for this basename: LIPASE, AMYLASE,  in the last 168 hours No results found for this basename: AMMONIA,  in the last 168 hours CBC:  Recent Labs Lab 02/13/14 0333 02/14/14 0317 02/15/14 0329 02/17/14 0447 02/18/14 0505 02/19/14 0915  WBC 4.9 5.9 11.6* 20.1* 23.0* 25.6*  NEUTROABS 3.5  --   --   --   --  22.5*  HGB 7.7* 7.2* 8.6* 8.4* 8.1* 8.3*  HCT 23.1* 22.2* 27.0* 25.7* 24.4* 25.6*  MCV  90.2 90.2 90.3 90.5 91.0 91.4  PLT 247 243 332 318 291 272   Cardiac Enzymes: No results found for this basename: CKTOTAL, CKMB, CKMBINDEX, TROPONINI,  in the last 168 hours BNP (last 3 results) No results found for this basename: PROBNP,  in the last 8760 hours CBG:  Recent Labs Lab 02/18/14 1223 02/18/14 1747 02/18/14 2129 02/19/14 0740 02/19/14 1200  GLUCAP 134* 125* 109* 97 110*    Recent Results (from the past 240 hour(s))  ANAEROBIC CULTURE     Status: None   Collection Time    02/11/14  8:19 AM      Result Value Ref Range Status   Specimen Description ABSCESS LEFT COLON   Final   Special Requests NONE   Final   Gram Stain     Final   Value: ABUNDANT WBC PRESENT,BOTH PMN AND MONONUCLEAR     NO SQUAMOUS EPITHELIAL CELLS SEEN     MODERATE GRAM POSITIVE COCCI     IN PAIRS     Performed at [facility]   Culture     Final   Value: BACTEROIDES THETAIOTAOMICRON     Note: BETA LACTAMASE POSITIVE     Performed at [facility]   Report Status 02/18/2014 FINAL   Final  CULTURE, ROUTINE-ABSCESS     Status: None    Collection Time    02/11/14  8:19 AM      Result Value Ref Range Status   Specimen Description ABSCESS LEFT COLON   Final   Special Requests NONE   Final   Gram Stain     Final   Value: ABUNDANT WBC PRESENT, PREDOMINANTLY PMN     NO SQUAMOUS EPITHELIAL CELLS SEEN     MODERATE GRAM POSITIVE COCCI     IN PAIRS     Performed at [facility]   Culture     Final   Value: MULTIPLE ORGANISMS PRESENT, NONE PREDOMINANT     Note: NO STAPHYLOCOCCUS AUREUS ISOLATED NO GROUP A STREP (S.PYOGENES) ISOLATED     Performed at [facility]   Report Status 02/15/2014 FINAL   Final  CULTURE, BLOOD (ROUTINE X 2)     Status: None   Collection Time    02/11/14  9:10 PM      Result Value Ref Range Status   Specimen Description BLOOD LEFT FOREARM   Final   Special Requests BOTTLES DRAWN AEROBIC ONLY 3CC   Final   Culture  Setup Time     Final   Value: 02/12/2014 03:02     Performed at [facility]   Culture     Final   Value: NO GROWTH 5 DAYS     Performed at [facility]   Report Status 02/18/2014 FINAL   Final  CULTURE, BLOOD (ROUTINE X 2)     Status: None   Collection Time    02/11/14  9:15 PM      Result Value Ref Range Status   Specimen Description BLOOD RIGHT HAND   Final   Special Requests BOTTLES DRAWN AEROBIC ONLY 3CC   Final   Culture  Setup Time     Final   Value: 02/12/2014 03:02     Performed at [facility]   Culture     Final   Value: NO GROWTH 5 DAYS     Performed at [facility]   Report Status 02/18/2014 FINAL   Final  URINE CULTURE  Status: None   Collection Time    02/17/14  4:39 PM      Result Value Ref Range Status   Specimen Description URINE, RANDOM   Final   Special Requests NONE   Final   Culture  Setup Time     Final   Value: 02/17/2014 23:38     Performed at [place]     Final   Value: NO GROWTH     Performed at [facility]   Culture     Final   Value: NO GROWTH      Performed at [facility]   Report Status 02/18/2014 FINAL   Final     Studies: Dg Chest 2 View  02/17/2014   CLINICAL DATA:  Dyspnea with exertion ; leukocytosis  EXAM: CHEST  2 VIEW  COMPARISON:  Feb 12, 2014  FINDINGS: Port-A-Cath tip is in the superior vena cava. No pneumothorax. There is patchy infiltrate in the left base. Lungs elsewhere clear. Heart is upper normal in size with normal pulmonary vascularity. No adenopathy. There is postoperative change in the right neck region.  IMPRESSION: Patchy infiltrate left base. Lungs elsewhere clear. No pneumothorax.   Electronically Signed   By: Lowella Grip M.D.   On: 02/17/2014 14:18   US Renal  02/18/2014   CLINICAL DATA:  Acute renal failure. History of hysterectomy, hypertension, appendectomy and breast cancer.  EXAM: RENAL/URINARY TRACT ULTRASOUND COMPLETE  COMPARISON:  Abdominal CT 02/11/2014.  FINDINGS: Examination is mildly limited by body habitus.  Right Kidney:  Length: 13.3 cm. There is a 2.3 x 2.5 x 2.1 cm cyst in the lower pole. No hydronephrosis.  Left Kidney:  Length: 12.4 cm. Echogenicity within normal limits. No mass or hydronephrosis visualized.  Bladder:  Appears normal for degree of bladder distention.  Other: Gallstones are noted incidentally.  IMPRESSION: No hydronephrosis or acute findings demonstrated. Right renal cyst and gallstones noted.   Electronically Signed   By: Camie Patience M.D.   On: 02/18/2014 16:51    Scheduled Meds: . feeding supplement (ENSURE COMPLETE)  237 mL Oral BID BM  . feeding supplement (ENSURE)  1 Container Oral TID BM  . heparin subcutaneous  5,000 Units Subcutaneous Q8H  . insulin aspart  0-15 Units Subcutaneous TID WC  . linezolid  600 mg Intravenous Q12H  . pantoprazole (PROTONIX) IV  40 mg Intravenous QHS  . piperacillin-tazobactam (ZOSYN)  IV  2.25 g Intravenous Q8H   Continuous Infusions: . sodium chloride 10 mL/hr at 02/16/14 1853  . sodium chloride 100 mL/hr at 02/19/14 0544      Principal Problem:   Perforated sigmoid colon Active Problems:   Septic shock(785.52)   ARF (acute renal failure)   Leukocytosis, unspecified    Time spent: 25 minutes. Greater than 50% of this time was spent in direct contact with the patient coordinating care.    [facility] Pager [phone]  If 7PM-7AM, please contact night-coverage at [email or web address], password [facility] 02/19/2014, 12:42 PM  LOS: 9 days

## 2014-02-20 LAB — BASIC METABOLIC PANEL
BUN: 18 mg/dL (ref 6–23)
CO2: 25 meq/L (ref 19–32)
CREATININE: 2.95 mg/dL — AB (ref 0.50–1.10)
Calcium: 8.4 mg/dL (ref 8.4–10.5)
Chloride: 104 mEq/L (ref 96–112)
GFR calc Af Amer: 18 mL/min — ABNORMAL LOW (ref >90)
GFR, EST NON AFRICAN AMERICAN: 16 mL/min — AB (ref >90)
Glucose, Bld: 101 mg/dL — ABNORMAL HIGH (ref 70–99)
Potassium: 3.7 mEq/L (ref 3.7–5.3)
Sodium: 140 mEq/L (ref 137–147)

## 2014-02-20 LAB — GLUCOSE, CAPILLARY
Glucose-Capillary: 127 mg/dL — ABNORMAL HIGH (ref 70–99)
Glucose-Capillary: 94 mg/dL (ref 70–99)
Glucose-Capillary: 137 mg/dL — ABNORMAL HIGH (ref 70–99)
Glucose-Capillary: 114 mg/dL — ABNORMAL HIGH (ref 70–99)
Glucose-Capillary: 128 mg/dL — ABNORMAL HIGH (ref 70–99)

## 2014-02-20 LAB — CBC
HCT: 24.1 % — ABNORMAL LOW (ref 36.0–46.0)
Hemoglobin: 7.9 g/dL — ABNORMAL LOW (ref 12.0–15.0)
MCH: 29.8 pg (ref 26.0–34.0)
MCHC: 32.8 g/dL (ref 30.0–36.0)
MCV: 90.9 fL (ref 78.0–100.0)
PLATELETS: 207 10*3/uL (ref 150–400)
RBC: 2.65 MIL/uL — ABNORMAL LOW (ref 3.87–5.11)
RDW: 14.5 % (ref 11.5–15.5)
WBC: 23.1 10*3/uL — ABNORMAL HIGH (ref 4.0–10.5)

## 2014-02-20 MED ORDER — PANTOPRAZOLE SODIUM 40 MG PO TBEC
40.0000 mg | DELAYED_RELEASE_TABLET | Freq: Every day | ORAL | Status: DC
  Administered 2014-02-21 – 2014-02-24 (×4): 40 mg via ORAL
  Filled 2014-02-20 (×4): qty 1

## 2014-02-20 NOTE — Progress Notes (Signed)
9 Days Post-Op  Subjective: No complaints. Feels a little better every day  Objective: Vital signs in last 24 hours: Temp:  [97.4 F (36.3 C)-98.1 F (36.7 C)] 97.4 F (36.3 C) (05/25 0611) Pulse Rate:  [54-62] 54 (05/25 0611) Resp:  [16-18] 16 (05/25 0611) BP: (120-147)/(76-83) 139/76 mmHg (05/25 0611) SpO2:  [92 %-96 %] 93 % (05/25 0611) Last BM Date:  (colostomy working)  Intake/Output from previous day: 05/24 0701 - 05/25 0700 In: 2993.3 [P.O.:240; I.V.:1953.3; IV Piggyback:800] Out: 3050 [Urine:2700; Stool:350] Intake/Output this shift:    Resp: clear to auscultation bilaterally Cardio: regular rate and rhythm GI: soft, nontender. wound clean. ostomy productive  Lab Results:   Recent Labs  02/19/14 0915 02/20/14 0416  WBC 25.6* 23.1*  HGB 8.3* 7.9*  HCT 25.6* 24.1*  PLT 272 207   BMET  Recent Labs  02/19/14 0448 02/20/14 0416  NA 141 140  K 3.6* 3.7  CL 104 104  CO2 26 25  GLUCOSE 89 101*  BUN 19 18  CREATININE 3.02* 2.95*  CALCIUM 8.5 8.4   PT/INR No results found for this basename: LABPROT, INR,  in the last 72 hours ABG No results found for this basename: PHART, PCO2, PO2, HCO3,  in the last 72 hours  Studies/Results: Ct Abdomen Pelvis Wo Contrast  02/19/2014   CLINICAL DATA:  68 year old female with recent partial colectomy and left lower quadrant ostomy for perforated diverticulitis and abscess. Continued elevated white count.  EXAM: CT ABDOMEN AND PELVIS WITHOUT CONTRAST  TECHNIQUE: Multidetector CT imaging of the abdomen and pelvis was performed following the standard protocol without IV contrast.  COMPARISON:  02/11/2014  FINDINGS: Small bilateral pleural effusions and mild to moderate left basilar atelectasis noted.  Postoperative changes/collection within the right breast again noted.  There has been interval abdominal surgery/ partial left colectomy with left lower quadrant ostomy.  Postoperative changes within the left abdomen are noted. A  1.1 x 1.2 cm faint collection along the sigmoid colon has decreased in size, previously measuring 1.7 x 2.3 cm.  A small amount of extraperitoneal gas anterior to the lower left bladder is likely related to recent surgery.  A small amount of gas within the bladder is likely related recent catheterization versus infection.  Small amount of free fluid within the pelvis is noted.  There is no evidence of pneumoperitoneum or bowel obstruction.  Fatty infiltration of the liver is again noted.  The spleen, gallbladder, adrenal glands, pancreas and kidneys are unremarkable except for a right renal cyst.  Please note that parenchymal abnormalities may be missed without intravenous contrast.  There is no evidence of enlarged lymph nodes, biliary dilatation or abdominal aortic aneurysm.  Diffuse subcutaneous edema is noted.  IMPRESSION: Postoperative/ partial colectomy changes within the left abdomen and pelvis without new abscess identified. Decreasing size of perisigmoid abscess, now measuring 1.1 x 1.2 cm, previously 1.7 x 2.3 cm.  Small amount of extraperitoneal gas along the anterior left bladder - suspect postoperative.  Small amount gas within the bladder - question recent catheterization versus infection.  Small amount of free pelvic fluid which may be postoperative.  Small bilateral pleural effusions with mild to moderate left basilar atelectasis. Mild diffuse subcutaneous edema.  Fatty infiltration of the liver.   Electronically Signed   By: Hassan Rowan M.D.   On: 02/19/2014 13:16   US Renal  02/18/2014   CLINICAL DATA:  Acute renal failure. History of hysterectomy, hypertension, appendectomy and breast cancer.  EXAM: RENAL/URINARY  TRACT ULTRASOUND COMPLETE  COMPARISON:  Abdominal CT 02/11/2014.  FINDINGS: Examination is mildly limited by body habitus.  Right Kidney:  Length: 13.3 cm. There is a 2.3 x 2.5 x 2.1 cm cyst in the lower pole. No hydronephrosis.  Left Kidney:  Length: 12.4 cm. Echogenicity within normal  limits. No mass or hydronephrosis visualized.  Bladder:  Appears normal for degree of bladder distention.  Other: Gallstones are noted incidentally.  IMPRESSION: No hydronephrosis or acute findings demonstrated. Right renal cyst and gallstones noted.   Electronically Signed   By: Camie Patience M.D.   On: 02/18/2014 16:51    Anti-infectives: Anti-infectives   Start     Dose/Rate Route Frequency Ordered Stop   02/18/14 1330  linezolid (ZYVOX) IVPB 600 mg     600 mg 300 mL/hr over 60 Minutes Intravenous Every 12 hours 02/18/14 1217     02/18/14 1000  piperacillin-tazobactam (ZOSYN) IVPB 2.25 g     2.25 g 100 mL/hr over 30 Minutes Intravenous Every 8 hours 02/18/14 0759     02/12/14 1000  vancomycin (VANCOCIN) IVPB 1000 mg/200 mL premix  Status:  Discontinued     1,000 mg 200 mL/hr over 60 Minutes Intravenous Every 12 hours 02/12/14 0537 02/17/14 0633   02/11/14 2130  vancomycin (VANCOCIN) 1,250 mg in sodium chloride 0.9 % 250 mL IVPB     1,250 mg 166.7 mL/hr over 90 Minutes Intravenous  Once 02/11/14 2117 02/11/14 2310   02/11/14 2030  micafungin (MYCAMINE) 100 mg in sodium chloride 0.9 % 100 mL IVPB  Status:  Discontinued     100 mg 100 mL/hr over 1 Hours Intravenous Daily at bedtime 02/11/14 1939 02/16/14 0805   02/11/14 1800  piperacillin-tazobactam (ZOSYN) IVPB 3.375 g  Status:  Discontinued     3.375 g 12.5 mL/hr over 240 Minutes Intravenous Every 8 hours 02/11/14 1150 02/18/14 0759   02/11/14 0330  piperacillin-tazobactam (ZOSYN) IVPB 3.375 g  Status:  Discontinued     3.375 g 12.5 mL/hr over 240 Minutes Intravenous  Once 02/11/14 0318 02/11/14 0320   02/11/14 0330  piperacillin-tazobactam (ZOSYN) IVPB 3.375 g     3.375 g 100 mL/hr over 30 Minutes Intravenous  Once 02/11/14 0321 02/11/14 0410      Assessment/Plan: s/p Procedure(s): EXPLORATORY LAPAROTOMY, COLON RESECTION, COLOSTOMY (N/A) Advance diet to regular Continue dressing changes Continue zosyn and zyvox for possible  pneumonia CT neg ambulate  LOS: 10 days    Luella Cook III 02/20/2014

## 2014-02-20 NOTE — Consult Note (Signed)
WOC ostomy follow up Stoma type/location: LLQ Colostomy Stomal assessment/size: 1 and 5/8 inches round, moist, budded.  Os at center. Peristomal assessment: Intact, clear Treatment options for stomal/peristomal skin: None indicated Output Light brown stool Ostomy pouching: 2pc. 2 and 1/4 inches Education provided: Extended session to teach patient ostomy pouch emptying.  Also changed pouch and discussed pouching supplies, resizing, change and emptying frequency, diet and resumption of lifestyle activities, such as intimacy and swimming in her pool.  Different products discussed such as deodorizing lubricant, opaque pouches with integrated gas filters and closed end pouches.  Patient engaged and asking appropriate questions.  Desires independence as her husband is a little  reluctant to assist with ostomy care. Reminded that [facility] will be there to assist as she continues to practice with her ostomy pouch changes. Enrolled patient in [place] Discharge program: Yes  WOC wound follow up Wound type:Surgical Measurement: 20 x 4 x 3cm Wound CWU:GQBV pink, dry, free of necrotic tissue Drainage (amount, consistency, odor) scant serous Periwound:intact Dressing procedure/placement/frequency: I placed a layer of silicone as a wound contact layer today to avoid any possible trauma to the wound bed (Mepitel, Kellie Simmering [phone]).  Over that is placed 1 piece of black GranuFoam.  It is secured with drape and attached to negative pressure; seal is achieved immediately and patient tolerated procedure well.  139mmHg continuous pressure in place. [place] nursing team will follow, and will remain available to this patient, the nursing, surgical and medical teams.   Thanks, Maudie Flakes, MSN, RN, [place], [place], [place] [phone])

## 2014-02-20 NOTE — Progress Notes (Signed)
Physical Therapy Treatment Patient Details Name: Martha Evans MRN: [id number] DOB: 1946-05-05 Today's Date: 03-19-2014    History of Present Illness pt admitted 02/10/14 with perforated colon with abcess, s/p exploratory lap, colostomy, wound VAC,postop setic shock, hypotension. Pt has recnt h/p of R breast cancer/mastectomy in 3/15.    PT Comments    Progressing well; may want to try cane next visit  Follow Up Recommendations  Home health PT;Supervision/Assistance - 24 hour     Equipment Recommendations  None recommended by PT;Other (comment) (? cane)    Recommendations for Other Services       Precautions / Restrictions Precautions Precaution Comments: VAC abdomen    Mobility  Bed Mobility Overal bed mobility: Needs Assistance Bed Mobility: Rolling;Sidelying to Sit Rolling: Supervision Sidelying to sit: Supervision       General bed mobility comments: for safety and line management  Transfers Overall transfer level: Needs assistance Equipment used: None Transfers: Sit to/from Stand;Stand Pivot Transfers Sit to Stand: Supervision Stand pivot transfers: Supervision       General transfer comment: for safety and line management  Ambulation/Gait Ambulation/Gait assistance: Supervision;Min guard Ambulation Distance (Feet): 500 Feet Assistive device: None (or IV push) Gait Pattern/deviations: Step-through pattern     General Gait Details: pt iwth better gait stability this session but still feels better with IV pole or touching rail   Stairs            Wheelchair Mobility    Modified Rankin (Stroke Patients Only)       Balance                                    Cognition Arousal/Alertness: Awake/alert Behavior During Therapy: WFL for tasks assessed/performed Overall Cognitive Status: Within Functional Limits for tasks assessed                      Exercises Total Joint Exercises Knee Flexion: Strengthening;Both;15  reps;Standing General Exercises - Lower Extremity Heel Raises: Strengthening;Both;15 reps;Standing Mini-Sqauts: Strengthening;Both;Standing;10 reps    General Comments        Pertinent Vitals/Pain Pain controlled    Home Living                      Prior Function            PT Goals (current goals can now be found in the care plan section) Acute Rehab PT Goals Patient Stated Goal: I ned to walk Time For Goal Achievement: 03/01/14 Potential to Achieve Goals: Good Progress towards PT goals: Progressing toward goals    Frequency  Min 3X/week    PT Plan Current plan remains appropriate    Co-evaluation             End of Session   Activity Tolerance: Patient tolerated treatment well Patient left: in chair;with call bell/phone within reach     Time: 8242-3536 PT Time Calculation (min): 18 min  Charges:  $Gait Training: 8-22 mins                    G Codes:      Neil Crouch 2014-03-19, 1:49 PM

## 2014-02-20 NOTE — Progress Notes (Signed)
[facility] PROGRESS NOTE  Martha Evans HEN:[id number] DOB: 1946-02-24 DOA: 02/10/2014 PCP: [name], MD  Assessment/Plan: Leukocytosis/HCAP -CXR with LLL patchy infiltrate. -Suspicious for PNA. -Patient describes a mild cough and some SOB. -Treatment for HCAP has been started with zosyn/zyvox.  ARF -Cr down for the first time today. -Expect continued improvement. -Renal US without obstruction/hydronephrosis. -Continue IVF. -Not on nephrotoxic meds.  Colon Perforation -S/p colectomy with ostomy. -Management as per surgery.  Code Status: Full Code Family Communication: Patient only  Disposition Plan: To be determined     Antibiotics:  Zosyn  Zyvox   Subjective: No complaints; mild SOB and cough.  Objective: Filed Vitals:   02/19/14 0541 02/19/14 1400 02/19/14 2143 02/20/14 0611  BP: 117/77 120/80 147/83 139/76  Pulse: 59 60 62 54  Temp: 97.8 F (36.6 C) 98.1 F (36.7 C) 97.8 F (36.6 C) 97.4 F (36.3 C)  TempSrc: Oral Oral Oral Oral  Resp: 20 18 18 16   Height:      Weight:      SpO2: 91% 92% 96% 93%    Intake/Output Summary (Last 24 hours) at 02/20/14 1252 Last data filed at 02/20/14 0946  Gross per 24 hour  Intake 2201.67 ml  Output   2500 ml  Net -298.33 ml   Filed Weights   02/11/14 2115 02/13/14 0002 02/14/14 0400  Weight: 83.462 kg (184 lb) 90.7 kg (199 lb 15.3 oz) 87.8 kg (193 lb 9 oz)    Exam:   General:  AA Ox3  Cardiovascular: RRR  Respiratory: CTA B  Abdomen: S/NT/ND/+BS  Extremities: 1+edema bilaterally   Neurologic:  Non-focal  Data Reviewed: Basic Metabolic Panel:  Recent Labs Lab 02/14/14 0317  02/16/14 0455 02/17/14 0447 02/17/14 1350 02/18/14 0020 02/18/14 0505 02/19/14 0448 02/20/14 0416  NA 140  < > 145 140 140  --  142 141 140  K 3.3*  < > 3.0* 3.1* 3.4*  --  3.6* 3.6* 3.7  CL 104  < > 104 103 99  --  104 104 104  CO2 26  < > 31 27 27   --  26 26 25   GLUCOSE 109*  < > 100* 97  154*  --  99 89 101*  BUN 18  < > 17 18 20   --  22 19 18   CREATININE 0.72  < > 0.98 2.11* 2.54*  --  3.00* 3.02* 2.95*  CALCIUM 8.4  < > 8.4 8.6 8.8  --  8.3* 8.5 8.4  MG 2.4  --  2.2  --   --  2.2  --   --   --   PHOS 2.6  --   --   --   --   --   --   --   --   < > = values in this interval not displayed. Liver Function Tests:  Recent Labs Lab 02/15/14 0329  AST 19  ALT 21  ALKPHOS 107  BILITOT 0.6  PROT 5.5*  ALBUMIN 2.2*   No results found for this basename: LIPASE, AMYLASE,  in the last 168 hours No results found for this basename: AMMONIA,  in the last 168 hours CBC:  Recent Labs Lab 02/15/14 0329 02/17/14 0447 02/18/14 0505 02/19/14 0915 02/20/14 0416  WBC 11.6* 20.1* 23.0* 25.6* 23.1*  NEUTROABS  --   --   --  22.5*  --   HGB 8.6* 8.4* 8.1* 8.3* 7.9*  HCT 27.0* 25.7* 24.4* 25.6* 24.1*  MCV 90.3 90.5  91.0 91.4 90.9  PLT 332 318 291 272 207   Cardiac Enzymes: No results found for this basename: CKTOTAL, CKMB, CKMBINDEX, TROPONINI,  in the last 168 hours BNP (last 3 results) No results found for this basename: PROBNP,  in the last 8760 hours CBG:  Recent Labs Lab 02/19/14 1200 02/19/14 1732 02/19/14 2139 02/20/14 0735 02/20/14 1201  GLUCAP 110* 130* 127* 94 137*    Recent Results (from the past 240 hour(s))  ANAEROBIC CULTURE     Status: None   Collection Time    02/11/14  8:19 AM      Result Value Ref Range Status   Specimen Description ABSCESS LEFT COLON   Final   Special Requests NONE   Final   Gram Stain     Final   Value: ABUNDANT WBC PRESENT,BOTH PMN AND MONONUCLEAR     NO SQUAMOUS EPITHELIAL CELLS SEEN     MODERATE GRAM POSITIVE COCCI     IN PAIRS     Performed at [facility]   Culture     Final   Value: BACTEROIDES THETAIOTAOMICRON     Note: BETA LACTAMASE POSITIVE     Performed at [facility]   Report Status 02/18/2014 FINAL   Final  CULTURE, ROUTINE-ABSCESS     Status: None   Collection Time    02/11/14  8:19 AM       Result Value Ref Range Status   Specimen Description ABSCESS LEFT COLON   Final   Special Requests NONE   Final   Gram Stain     Final   Value: ABUNDANT WBC PRESENT, PREDOMINANTLY PMN     NO SQUAMOUS EPITHELIAL CELLS SEEN     MODERATE GRAM POSITIVE COCCI     IN PAIRS     Performed at [facility]   Culture     Final   Value: MULTIPLE ORGANISMS PRESENT, NONE PREDOMINANT     Note: NO STAPHYLOCOCCUS AUREUS ISOLATED NO GROUP A STREP (S.PYOGENES) ISOLATED     Performed at [facility]   Report Status 02/15/2014 FINAL   Final  CULTURE, BLOOD (ROUTINE X 2)     Status: None   Collection Time    02/11/14  9:10 PM      Result Value Ref Range Status   Specimen Description BLOOD LEFT FOREARM   Final   Special Requests BOTTLES DRAWN AEROBIC ONLY 3CC   Final   Culture  Setup Time     Final   Value: 02/12/2014 03:02     Performed at [facility]   Culture     Final   Value: NO GROWTH 5 DAYS     Performed at [facility]   Report Status 02/18/2014 FINAL   Final  CULTURE, BLOOD (ROUTINE X 2)     Status: None   Collection Time    02/11/14  9:15 PM      Result Value Ref Range Status   Specimen Description BLOOD RIGHT HAND   Final   Special Requests BOTTLES DRAWN AEROBIC ONLY 3CC   Final   Culture  Setup Time     Final   Value: 02/12/2014 03:02     Performed at [facility]   Culture     Final   Value: NO GROWTH 5 DAYS     Performed at [facility]   Report Status 02/18/2014 FINAL   Final  URINE CULTURE     Status: None  Collection Time    02/17/14  4:39 PM      Result Value Ref Range Status   Specimen Description URINE, RANDOM   Final   Special Requests NONE   Final   Culture  Setup Time     Final   Value: 02/17/2014 23:38     Performed at [facility]     Final   Value: NO GROWTH     Performed at [facility]   Culture     Final   Value: NO GROWTH     Performed at [facility]   Report  Status 02/18/2014 FINAL   Final     Studies: Ct Abdomen Pelvis Wo Contrast  02/19/2014   CLINICAL DATA:  68 year old female with recent partial colectomy and left lower quadrant ostomy for perforated diverticulitis and abscess. Continued elevated white count.  EXAM: CT ABDOMEN AND PELVIS WITHOUT CONTRAST  TECHNIQUE: Multidetector CT imaging of the abdomen and pelvis was performed following the standard protocol without IV contrast.  COMPARISON:  02/11/2014  FINDINGS: Small bilateral pleural effusions and mild to moderate left basilar atelectasis noted.  Postoperative changes/collection within the right breast again noted.  There has been interval abdominal surgery/ partial left colectomy with left lower quadrant ostomy.  Postoperative changes within the left abdomen are noted. A 1.1 x 1.2 cm faint collection along the sigmoid colon has decreased in size, previously measuring 1.7 x 2.3 cm.  A small amount of extraperitoneal gas anterior to the lower left bladder is likely related to recent surgery.  A small amount of gas within the bladder is likely related recent catheterization versus infection.  Small amount of free fluid within the pelvis is noted.  There is no evidence of pneumoperitoneum or bowel obstruction.  Fatty infiltration of the liver is again noted.  The spleen, gallbladder, adrenal glands, pancreas and kidneys are unremarkable except for a right renal cyst.  Please note that parenchymal abnormalities may be missed without intravenous contrast.  There is no evidence of enlarged lymph nodes, biliary dilatation or abdominal aortic aneurysm.  Diffuse subcutaneous edema is noted.  IMPRESSION: Postoperative/ partial colectomy changes within the left abdomen and pelvis without new abscess identified. Decreasing size of perisigmoid abscess, now measuring 1.1 x 1.2 cm, previously 1.7 x 2.3 cm.  Small amount of extraperitoneal gas along the anterior left bladder - suspect postoperative.  Small amount gas  within the bladder - question recent catheterization versus infection.  Small amount of free pelvic fluid which may be postoperative.  Small bilateral pleural effusions with mild to moderate left basilar atelectasis. Mild diffuse subcutaneous edema.  Fatty infiltration of the liver.   Electronically Signed   By: Hassan Rowan M.D.   On: 02/19/2014 13:16   US Renal  02/18/2014   CLINICAL DATA:  Acute renal failure. History of hysterectomy, hypertension, appendectomy and breast cancer.  EXAM: RENAL/URINARY TRACT ULTRASOUND COMPLETE  COMPARISON:  Abdominal CT 02/11/2014.  FINDINGS: Examination is mildly limited by body habitus.  Right Kidney:  Length: 13.3 cm. There is a 2.3 x 2.5 x 2.1 cm cyst in the lower pole. No hydronephrosis.  Left Kidney:  Length: 12.4 cm. Echogenicity within normal limits. No mass or hydronephrosis visualized.  Bladder:  Appears normal for degree of bladder distention.  Other: Gallstones are noted incidentally.  IMPRESSION: No hydronephrosis or acute findings demonstrated. Right renal cyst and gallstones noted.   Electronically Signed   By: Modesta Messing.D.  On: 02/18/2014 16:51    Scheduled Meds: . feeding supplement (ENSURE COMPLETE)  237 mL Oral BID BM  . feeding supplement (ENSURE)  1 Container Oral TID BM  . heparin subcutaneous  5,000 Units Subcutaneous Q8H  . linezolid  600 mg Intravenous Q12H  . pantoprazole (PROTONIX) IV  40 mg Intravenous QHS  . piperacillin-tazobactam (ZOSYN)  IV  2.25 g Intravenous Q8H   Continuous Infusions: . sodium chloride 10 mL/hr at 02/16/14 1853  . sodium chloride 100 mL/hr at 02/20/14 4696    Principal Problem:   Perforated sigmoid colon Active Problems:   Septic shock(785.52)   ARF (acute renal failure)   Leukocytosis, unspecified    Time spent: 25 minutes. Greater than 50% of this time was spent in direct contact with the patient coordinating care.    [facility] Pager [phone]  If 7PM-7AM,  please contact night-coverage at [email or web address], password [facility] 02/20/2014, 12:52 PM  LOS: 10 days

## 2014-02-21 LAB — GLUCOSE, CAPILLARY
Glucose-Capillary: 90 mg/dL (ref 70–99)
Glucose-Capillary: 137 mg/dL — ABNORMAL HIGH (ref 70–99)
Glucose-Capillary: 98 mg/dL (ref 70–99)
Glucose-Capillary: 115 mg/dL — ABNORMAL HIGH (ref 70–99)

## 2014-02-21 LAB — BASIC METABOLIC PANEL
BUN: 16 mg/dL (ref 6–23)
CHLORIDE: 106 meq/L (ref 96–112)
CO2: 24 meq/L (ref 19–32)
Calcium: 8.5 mg/dL (ref 8.4–10.5)
Creatinine, Ser: 2.88 mg/dL — ABNORMAL HIGH (ref 0.50–1.10)
GFR calc Af Amer: 19 mL/min — ABNORMAL LOW (ref >90)
GFR calc non Af Amer: 16 mL/min — ABNORMAL LOW (ref >90)
GLUCOSE: 103 mg/dL — AB (ref 70–99)
POTASSIUM: 3.7 meq/L (ref 3.7–5.3)
SODIUM: 140 meq/L (ref 137–147)

## 2014-02-21 LAB — CBC
HEMATOCRIT: 24.9 % — AB (ref 36.0–46.0)
Hemoglobin: 8.1 g/dL — ABNORMAL LOW (ref 12.0–15.0)
MCH: 29.5 pg (ref 26.0–34.0)
MCHC: 32.5 g/dL (ref 30.0–36.0)
MCV: 90.5 fL (ref 78.0–100.0)
PLATELETS: 191 10*3/uL (ref 150–400)
RBC: 2.75 MIL/uL — ABNORMAL LOW (ref 3.87–5.11)
RDW: 14.7 % (ref 11.5–15.5)
WBC: 21 10*3/uL — ABNORMAL HIGH (ref 4.0–10.5)

## 2014-02-21 MED ORDER — CALCIUM CARBONATE ANTACID 500 MG PO CHEW: 1.0000 | CHEWABLE_TABLET | ORAL | Status: DC | PRN

## 2014-02-21 MED ORDER — FAMOTIDINE 20 MG PO TABS
20.0000 mg | ORAL_TABLET | Freq: Two times a day (BID) | ORAL | Status: DC | PRN
  Filled 2014-02-21: qty 1

## 2014-02-21 MED ORDER — PIPERACILLIN-TAZOBACTAM 3.375 G IVPB
3.3750 g | Freq: Three times a day (TID) | INTRAVENOUS | Status: DC
  Administered 2014-02-21 – 2014-02-23 (×6): 3.375 g via INTRAVENOUS
  Filled 2014-02-21 (×7): qty 50

## 2014-02-21 NOTE — Progress Notes (Signed)
ANTIBIOTIC CONSULT NOTE - Follow Up  Pharmacy Consult for Zosyn Indication: abdominal infection  Allergies  Allergen Reactions  . Codeine Sulfate Nausea And Vomiting  . Tramadol Nausea Only    Nausea   . Hydrocodone Nausea Only  . Oxycodone Nausea Only    Patient Measurements: Height: 5\' 6"  (167.6 cm) Weight: 193 lb 9 oz (87.8 kg) IBW/kg (Calculated) : 59.3 Adjusted Body Weight: 70.7kg   Vital Signs: Temp: 97.7 F (36.5 C) (05/26 0552) Temp src: Oral (05/26 0552) BP: 132/68 mmHg (05/26 0552) Pulse Rate: 57 (05/26 0552) Intake/Output from previous day: 05/25 0701 - 05/26 0700 In: 3471.7 [P.O.:720; I.V.:2001.7; IV Piggyback:750] Out: 2450 [Urine:2350; Stool:100]  Labs:  Recent Labs  02/19/14 0448 02/19/14 0915 02/20/14 0416 02/21/14 0505  WBC  --  25.6* 23.1*  --   HGB  --  8.3* 7.9*  --   PLT  --  272 207  --   CREATININE 3.02*  --  2.95* 2.88*   Estimated Creatinine Clearance: 21.4 ml/min (by C-G formula based on Cr of 2.88). No results found for this basename: VANCOTROUGH, VANCOPEAK, VANCORANDOM, GENTTROUGH, GENTPEAK, GENTRANDOM, TOBRATROUGH, TOBRAPEAK, TOBRARND, AMIKACINPEAK, AMIKACINTROU, AMIKACIN,  in the last 72 hours    Medications:  Scheduled:  . feeding supplement (ENSURE COMPLETE)  237 mL Oral BID BM  . feeding supplement (ENSURE)  1 Container Oral TID BM  . heparin subcutaneous  5,000 Units Subcutaneous Q8H  . linezolid  600 mg Intravenous Q12H  . pantoprazole  40 mg Oral Daily  . piperacillin-tazobactam (ZOSYN)  IV  2.25 g Intravenous Q8H   Infusions:  . sodium chloride 10 mL/hr at 02/16/14 1853  . sodium chloride 50 mL/hr at 02/21/14 2956   Assessment: 67 y/oF admitted 5/15 with pneumoperitoneum due to perforated colonic divertculi with surrounding abscess. Underwent emergent laparotomy and colon resection with colostomy placement on 02/11/14. Hx breast CA s/p chemo and R partial mastectomy (3/15). Patient well-known to pharmacy for vancomycin  dosing, now being consulted to dose Zosyn due to acute renal failure.   Antiinfectives 5/16 >> Micafungin >> 5/20 5/16 >> Vanc >> 5/22 5/16 >> Zosyn (Rx) >> 5/23 >> Zyvox (MD) >>  Labs / vitals Tmax: remains afebrile WBCs: increased to 23 (neulasta 5/12, steroids) Renal: ARF, SCr slightly improved 2.88 (0.8 on admit), 22CG, UOP 1.1 ml/kg/hr  Microbiology 5/16 blood x2: NGF 5/16 UA: neg 5/16 abscess: mult organisms, none predominant (no Staph aureus, no Group A Strep); no anaerobes isolated (IP) -anaerobic: BACTEROIDES THETAIOTAOMICRON. Note: BETA LACTAMASE POSITIVE 5/22 urine: NGF  Goal of Therapy:  Zosyn per indication and renal function  Plan:   Increase Zosyn to 3.375gm IV q8h (4hr extended infusions) since CrCl now > 20 ml/min  Continue to monitor renal function  Follow up duration of therapy (currently day 11 antibiotics)  Peggyann Juba, PharmD, BCPS Pager: [phone] Pharmacy: [phone] 02/21/2014 9:37 AM

## 2014-02-21 NOTE — Progress Notes (Signed)
Physical Therapy Treatment Patient Details Name: Martha Evans MRN: [id number] DOB: [birth year] Today's Date: 02/21/2014    History of Present Illness pt admitted 02/10/14 with perforated colon with abcess, s/p exploratory lap, colostomy, wound VAC,postop setic shock, hypotension. Pt has recnt h/p of R breast cancer/mastectomy in 3/15.    PT Comments    Pt OOB in recliner feeling "Okay".  Assisted out of recliner to [facility] then amb around unit twice holding to IV pole.  Follow Up Recommendations  Home health PT     Equipment Recommendations       Recommendations for Other Services       Precautions / Restrictions Precautions Precautions: Fall Precaution Comments: VAC abdomen Restrictions Weight Bearing Restrictions: No    Mobility  Bed Mobility               General bed mobility comments: Pt OOB in recliner  Transfers Overall transfer level: Needs assistance Equipment used: None Transfers: Sit to/from Stand;Stand Pivot Transfers Sit to Stand: Supervision Stand pivot transfers: Supervision       General transfer comment: for safety and line management.  Transitioned from recliner to [facility] with good safety cognition and use of hands to steady self.  Ambulation/Gait Ambulation/Gait assistance: Supervision;Min guard Ambulation Distance (Feet): 500 Feet Assistive device: None Gait Pattern/deviations: Step-through pattern Gait velocity: decreased   General Gait Details: good alternating gait   Stairs            Wheelchair Mobility    Modified Rankin (Stroke Patients Only)       Balance                                    Cognition                            Exercises      General Comments        Pertinent Vitals/Pain     Home Living                      Prior Function            PT Goals (current goals can now be found in the care plan section) Progress towards PT goals: Progressing toward goals    Frequency  Min 3X/week    PT Plan      Co-evaluation             End of Session Equipment Utilized During Treatment: Gait belt Activity Tolerance: Patient tolerated treatment well Patient left: in chair;with call bell/phone within reach     Time: 1333-1400 PT Time Calculation (min): 27 min  Charges:  $Gait Training: 8-22 mins $Therapeutic Activity: 8-22 mins                    G Codes:      Rica Koyanagi  PTA [facility]  Acute  Rehab Pager      [phone]

## 2014-02-21 NOTE — Progress Notes (Signed)
Seen and agree  

## 2014-02-21 NOTE — Progress Notes (Signed)
NUTRITION FOLLOW UP  Intervention:   - Continue Ensure Complete BID - D/C Ensure pudding TID - Assisted pt with ordering lunch - Encouraged continued excellent intake - RD to continue to monitor  Nutrition Dx:   Increased nutrient needs related to GI surgery as evidenced by MD notes - ongoing    Goal:   Pt to consume >90% of meals/supplements - not met consistently    Monitor:   Weights, labs, intake  Assessment:   Pt admitted with abdominal pain, found to have pnuemoperitoneum/abscesses along left colon gutter/sigmoid colon likely secondary to perforated colon. Has right breast CA, invasive ductal CA and is s/p 2 cycles of chemotherapy.   5/18: -Pt reports having chemotherapy last Monday and was eating light due to treatment  -Was eating things every 2-3 hours like soup or 1/2 sandwich  -Reports her weight fluctuates up and down depending on treatment and how well she eats, but normally stays around 186 pounds  -Had exploratory laparotomy with removal of left colon and splenic fixture, Hartmann pouch, and left transverse colon end colostomy 5/16  -NPO currently except ice chips  5/22: -Diet advanced to soft today -Pt reports eating all of her meals today, 100% of french toast for breakfast and 100% of grilled cheese and mashed potatoes -Drank 100% of 1 Ensure Complete so far today -Denies any nausea  5/26: -Started on Zyvox 5/24 for possible pneumonia. Noted Zyvox has potential interactions with high tyramine containing foods. Hospitalized diet is not high in tyramine and poses no risk for patient at this time.  -Diet advanced to regular yesterday -Wound vac back on yesterday with plans, per surgery, to leave on until d/c  -Met with pt who reports eating well over the weekend, consistently >50% of meals and at least 1 Ensure Complete daily -Has been consistently refusing some of Ensure pudding, will d/c and continue Ensure Complete   Serum creatinine elevated but trending  down   Height: Ht Readings from Last 1 Encounters:  02/11/14 5' 6"  (1.676 m)    Weight Status:   Wt Readings from Last 1 Encounters:  02/14/14 193 lb 9 oz (87.8 kg)    Re-estimated needs:  Kcal: 1500-1700  Protein: 70-85g  Fluid: 1.5-1.7L/day   Skin: +1 generalized, RLE, LLE edema, abdominal and right breast incision   Diet Order: General   Intake/Output Summary (Last 24 hours) at 02/21/14 1401 Last data filed at 02/21/14 1003  Gross per 24 hour  Intake 2878.33 ml  Output   1650 ml  Net 1228.33 ml    Last BM: 5/25 from colostomy   Labs:   Recent Labs Lab 02/16/14 0455  02/18/14 0020  02/19/14 0448 02/20/14 0416 02/21/14 0505  NA 145  < >  --   < > 141 140 140  K 3.0*  < >  --   < > 3.6* 3.7 3.7  CL 104  < >  --   < > 104 104 106  CO2 31  < >  --   < > 26 25 24   BUN 17  < >  --   < > 19 18 16   CREATININE 0.98  < >  --   < > 3.02* 2.95* 2.88*  CALCIUM 8.4  < >  --   < > 8.5 8.4 8.5  MG 2.2  --  2.2  --   --   --   --   GLUCOSE 100*  < >  --   < >  89 101* 103*  < > = values in this interval not displayed.  CBG (last 3)   Recent Labs  02/20/14 2053 02/21/14 0717 02/21/14 1210  GLUCAP 128* 90 137*    Scheduled Meds: . feeding supplement (ENSURE COMPLETE)  237 mL Oral BID BM  . feeding supplement (ENSURE)  1 Container Oral TID BM  . heparin subcutaneous  5,000 Units Subcutaneous Q8H  . linezolid  600 mg Intravenous Q12H  . pantoprazole  40 mg Oral Daily  . piperacillin-tazobactam (ZOSYN)  IV  3.375 g Intravenous Q8H    Continuous Infusions: . sodium chloride 10 mL/hr at 02/16/14 1853  . sodium chloride 50 mL/hr at 02/21/14 [place], [place], [place] Pager [phone] After Hours Pager

## 2014-02-21 NOTE — Progress Notes (Signed)
[facility] PROGRESS NOTE  Martha Evans PPJ:[id number] DOB: November 19, 1945 DOA: 02/10/2014 PCP: [name], MD  Assessment/Plan: Leukocytosis/HCAP -CXR with LLL patchy infiltrate. -Suspicious for PNA. -Patient describes a mild cough and some SOB. -Treatment for HCAP has been started with zosyn/zyvox. -Would treat for 8-10 days total.  ARF -Cr continue to decrease to 2.88 today. -Expect continued improvement. -Renal US without obstruction/hydronephrosis. -Continue IVF. -Not on nephrotoxic meds.  Colon Perforation -S/p colectomy with ostomy. -Management as per surgery.  Leukocytosis -Continues to trend downwards. -Likely related to HCAP, abdominal infection and G-CSF received after last chemo session 10 days ago.  Code Status: Full Code Family Communication: Patient only  Disposition Plan: To be determined     Antibiotics:  Zosyn  Zyvox   Subjective: Feels some heartburn today.  Objective: Filed Vitals:   02/20/14 0611 02/20/14 1326 02/20/14 2055 02/21/14 0552  BP: 139/76 134/75 136/76 132/68  Pulse: 54 89 57 57  Temp: 97.4 F (36.3 C) 97.9 F (36.6 C) 98.1 F (36.7 C) 97.7 F (36.5 C)  TempSrc: Oral Oral Oral Oral  Resp: 16 16 18 16   Height:      Weight:      SpO2: 93% 97% 96% 95%    Intake/Output Summary (Last 24 hours) at 02/21/14 1341 Last data filed at 02/21/14 1003  Gross per 24 hour  Intake   3680 ml  Output   2450 ml  Net   1230 ml   Filed Weights   02/11/14 2115 02/13/14 0002 02/14/14 0400  Weight: 83.462 kg (184 lb) 90.7 kg (199 lb 15.3 oz) 87.8 kg (193 lb 9 oz)    Exam:   General:  AA Ox3  Cardiovascular: RRR  Respiratory: CTA B  Abdomen: S/NT/ND/+BS  Extremities: 1+edema bilaterally   Neurologic:  Non-focal  Data Reviewed: Basic Metabolic Panel:  Recent Labs Lab 02/16/14 0455  02/17/14 1350 02/18/14 0020 02/18/14 0505 02/19/14 0448 02/20/14 0416 02/21/14 0505  NA 145  < > 140  --  142 141  140 140  K 3.0*  < > 3.4*  --  3.6* 3.6* 3.7 3.7  CL 104  < > 99  --  104 104 104 106  CO2 31  < > 27  --  26 26 25 24   GLUCOSE 100*  < > 154*  --  99 89 101* 103*  BUN 17  < > 20  --  22 19 18 16   CREATININE 0.98  < > 2.54*  --  3.00* 3.02* 2.95* 2.88*  CALCIUM 8.4  < > 8.8  --  8.3* 8.5 8.4 8.5  MG 2.2  --   --  2.2  --   --   --   --   < > = values in this interval not displayed. Liver Function Tests:  Recent Labs Lab 02/15/14 0329  AST 19  ALT 21  ALKPHOS 107  BILITOT 0.6  PROT 5.5*  ALBUMIN 2.2*   No results found for this basename: LIPASE, AMYLASE,  in the last 168 hours No results found for this basename: AMMONIA,  in the last 168 hours CBC:  Recent Labs Lab 02/17/14 0447 02/18/14 0505 02/19/14 0915 02/20/14 0416 02/21/14 1045  WBC 20.1* 23.0* 25.6* 23.1* 21.0*  NEUTROABS  --   --  22.5*  --   --   HGB 8.4* 8.1* 8.3* 7.9* 8.1*  HCT 25.7* 24.4* 25.6* 24.1* 24.9*  MCV 90.5 91.0 91.4 90.9 90.5  PLT 318 291 272  207 191   Cardiac Enzymes: No results found for this basename: CKTOTAL, CKMB, CKMBINDEX, TROPONINI,  in the last 168 hours BNP (last 3 results) No results found for this basename: PROBNP,  in the last 8760 hours CBG:  Recent Labs Lab 02/20/14 1201 02/20/14 1725 02/20/14 2053 02/21/14 0717 02/21/14 1210  GLUCAP 137* 114* 128* 90 137*    Recent Results (from the past 240 hour(s))  CULTURE, BLOOD (ROUTINE X 2)     Status: None   Collection Time    02/11/14  9:10 PM      Result Value Ref Range Status   Specimen Description BLOOD LEFT FOREARM   Final   Special Requests BOTTLES DRAWN AEROBIC ONLY 3CC   Final   Culture  Setup Time     Final   Value: 02/12/2014 03:02     Performed at [facility]   Culture     Final   Value: NO GROWTH 5 DAYS     Performed at [facility]   Report Status 02/18/2014 FINAL   Final  CULTURE, BLOOD (ROUTINE X 2)     Status: None   Collection Time    02/11/14  9:15 PM      Result Value Ref Range  Status   Specimen Description BLOOD RIGHT HAND   Final   Special Requests BOTTLES DRAWN AEROBIC ONLY 3CC   Final   Culture  Setup Time     Final   Value: 02/12/2014 03:02     Performed at [place]     Final   Value: NO GROWTH 5 DAYS     Performed at [facility]   Report Status 02/18/2014 FINAL   Final  URINE CULTURE     Status: None   Collection Time    02/17/14  4:39 PM      Result Value Ref Range Status   Specimen Description URINE, RANDOM   Final   Special Requests NONE   Final   Culture  Setup Time     Final   Value: 02/17/2014 23:38     Performed at [place]     Final   Value: NO GROWTH     Performed at [facility]   Culture     Final   Value: NO GROWTH     Performed at [facility]   Report Status 02/18/2014 FINAL   Final     Studies: No results found.  Scheduled Meds: . feeding supplement (ENSURE COMPLETE)  237 mL Oral BID BM  . feeding supplement (ENSURE)  1 Container Oral TID BM  . heparin subcutaneous  5,000 Units Subcutaneous Q8H  . linezolid  600 mg Intravenous Q12H  . pantoprazole  40 mg Oral Daily  . piperacillin-tazobactam (ZOSYN)  IV  3.375 g Intravenous Q8H   Continuous Infusions: . sodium chloride 10 mL/hr at 02/16/14 1853  . sodium chloride 50 mL/hr at 02/21/14 1128    Principal Problem:   Perforated sigmoid colon Active Problems:   Septic shock(785.52)   ARF (acute renal failure)   Leukocytosis, unspecified    Time spent: 25 minutes. Greater than 50% of this time was spent in direct contact with the patient coordinating care.    [facility] Pager [phone]  If 7PM-7AM, please contact night-coverage at [email or web address], password [facility] 02/21/2014, 1:41 PM  LOS: 11 days

## 2014-02-21 NOTE — Progress Notes (Signed)
10 Days Post-Op  Subjective: Pt doing well today.   Feels good, no N/V, tolerating regular diet.  Ambulating some.  Using IS.  Wound vac back on yesterday.  Objective: Vital signs in last 24 hours: Temp:  [97.7 F (36.5 C)-98.1 F (36.7 C)] 97.7 F (36.5 C) (05/26 0552) Pulse Rate:  [57-89] 57 (05/26 0552) Resp:  [16-18] 16 (05/26 0552) BP: (132-136)/(68-76) 132/68 mmHg (05/26 0552) SpO2:  [95 %-97 %] 95 % (05/26 0552) Last BM Date:  (colostomy)  Intake/Output from previous day: 05/25 0701 - 05/26 0700 In: 3471.7 [P.O.:720; I.V.:2001.7; IV Piggyback:750] Out: 2450 [Urine:2350; Stool:100] Intake/Output this shift:    PE: Gen:  Alert, NAD, pleasant Card:  RRR, no M/G/R heard Pulm:  CTA, no W/R/R Abd: Soft, minimal tenderness, ND, +BS, no HSM, midline wound with wound vac in place, colostomy working with brown stools Ext:  No erythema, or tenderness, b/l +1 LE edema  Lab Results:   Recent Labs  02/19/14 0915 02/20/14 0416  WBC 25.6* 23.1*  HGB 8.3* 7.9*  HCT 25.6* 24.1*  PLT 272 207   BMET  Recent Labs  02/20/14 0416 02/21/14 0505  NA 140 140  K 3.7 3.7  CL 104 106  CO2 25 24  GLUCOSE 101* 103*  BUN 18 16  CREATININE 2.95* 2.88*  CALCIUM 8.4 8.5   PT/INR No results found for this basename: LABPROT, INR,  in the last 72 hours CMP     Component Value Date/Time   NA 140 02/21/2014 0505   NA 140 02/06/2014 1050   K 3.7 02/21/2014 0505   K 3.9 02/06/2014 1050   CL 106 02/21/2014 0505   CO2 24 02/21/2014 0505   CO2 25 02/06/2014 1050   GLUCOSE 103* 02/21/2014 0505   GLUCOSE 108 02/06/2014 1050   BUN 16 02/21/2014 0505   BUN 15.3 02/06/2014 1050   CREATININE 2.88* 02/21/2014 0505   CREATININE 0.8 02/06/2014 1050   CALCIUM 8.5 02/21/2014 0505   CALCIUM 9.5 02/06/2014 1050   PROT 5.5* 02/15/2014 0329   PROT 6.9 02/06/2014 1050   ALBUMIN 2.2* 02/15/2014 0329   ALBUMIN 3.1* 02/06/2014 1050   AST 19 02/15/2014 0329   AST 11 02/06/2014 1050   ALT 21 02/15/2014 0329   ALT  15 02/06/2014 1050   ALKPHOS 107 02/15/2014 0329   ALKPHOS 130 02/06/2014 1050   BILITOT 0.6 02/15/2014 0329   BILITOT 0.34 02/06/2014 1050   GFRNONAA 16* 02/21/2014 0505   GFRAA 19* 02/21/2014 0505   Lipase  No results found for this basename: lipase       Studies/Results: Ct Abdomen Pelvis Wo Contrast  02/19/2014   CLINICAL DATA:  68 year old female with recent partial colectomy and left lower quadrant ostomy for perforated diverticulitis and abscess. Continued elevated white count.  EXAM: CT ABDOMEN AND PELVIS WITHOUT CONTRAST  TECHNIQUE: Multidetector CT imaging of the abdomen and pelvis was performed following the standard protocol without IV contrast.  COMPARISON:  02/11/2014  FINDINGS: Small bilateral pleural effusions and mild to moderate left basilar atelectasis noted.  Postoperative changes/collection within the right breast again noted.  There has been interval abdominal surgery/ partial left colectomy with left lower quadrant ostomy.  Postoperative changes within the left abdomen are noted. A 1.1 x 1.2 cm faint collection along the sigmoid colon has decreased in size, previously measuring 1.7 x 2.3 cm.  A small amount of extraperitoneal gas anterior to the lower left bladder is likely related to recent surgery.  A  small amount of gas within the bladder is likely related recent catheterization versus infection.  Small amount of free fluid within the pelvis is noted.  There is no evidence of pneumoperitoneum or bowel obstruction.  Fatty infiltration of the liver is again noted.  The spleen, gallbladder, adrenal glands, pancreas and kidneys are unremarkable except for a right renal cyst.  Please note that parenchymal abnormalities may be missed without intravenous contrast.  There is no evidence of enlarged lymph nodes, biliary dilatation or abdominal aortic aneurysm.  Diffuse subcutaneous edema is noted.  IMPRESSION: Postoperative/ partial colectomy changes within the left abdomen and pelvis  without new abscess identified. Decreasing size of perisigmoid abscess, now measuring 1.1 x 1.2 cm, previously 1.7 x 2.3 cm.  Small amount of extraperitoneal gas along the anterior left bladder - suspect postoperative.  Small amount gas within the bladder - question recent catheterization versus infection.  Small amount of free pelvic fluid which may be postoperative.  Small bilateral pleural effusions with mild to moderate left basilar atelectasis. Mild diffuse subcutaneous edema.  Fatty infiltration of the liver.   Electronically Signed   By: Hassan Rowan M.D.   On: 02/19/2014 13:16    Anti-infectives: Anti-infectives   Start     Dose/Rate Route Frequency Ordered Stop   02/18/14 1330  linezolid (ZYVOX) IVPB 600 mg     600 mg 300 mL/hr over 60 Minutes Intravenous Every 12 hours 02/18/14 1217     02/18/14 1000  piperacillin-tazobactam (ZOSYN) IVPB 2.25 g     2.25 g 100 mL/hr over 30 Minutes Intravenous Every 8 hours 02/18/14 0759     02/12/14 1000  vancomycin (VANCOCIN) IVPB 1000 mg/200 mL premix  Status:  Discontinued     1,000 mg 200 mL/hr over 60 Minutes Intravenous Every 12 hours 02/12/14 0537 02/17/14 0633   02/11/14 2130  vancomycin (VANCOCIN) 1,250 mg in sodium chloride 0.9 % 250 mL IVPB     1,250 mg 166.7 mL/hr over 90 Minutes Intravenous  Once 02/11/14 2117 02/11/14 2310   02/11/14 2030  micafungin (MYCAMINE) 100 mg in sodium chloride 0.9 % 100 mL IVPB  Status:  Discontinued     100 mg 100 mL/hr over 1 Hours Intravenous Daily at bedtime 02/11/14 1939 02/16/14 0805   02/11/14 1800  piperacillin-tazobactam (ZOSYN) IVPB 3.375 g  Status:  Discontinued     3.375 g 12.5 mL/hr over 240 Minutes Intravenous Every 8 hours 02/11/14 1150 02/18/14 0759   02/11/14 0330  piperacillin-tazobactam (ZOSYN) IVPB 3.375 g  Status:  Discontinued     3.375 g 12.5 mL/hr over 240 Minutes Intravenous  Once 02/11/14 0318 02/11/14 0320   02/11/14 0330  piperacillin-tazobactam (ZOSYN) IVPB 3.375 g     3.375  g 100 mL/hr over 30 Minutes Intravenous  Once 02/11/14 0321 02/11/14 0410       Assessment/Plan Perforated colon/pneumoperitoneum POD #10 s/p EXPLORATORY LAPAROTOMY, COLON RESECTION, COLOSTOMY Pneumonia AKF - Cr. Mildly improved at 2.88  Plan: Tolerating regular diet Continue wound vac until discharge Continue zosyn and zyvox for possible pneumonia, appreciate medicines help CT neg  Ambulate/IS SCD's and heparin for dvt proph Continue gentle IV hydration, but red. IVF to 27mL/hr since she appears to be having some evidence of fluid overload Push oral hydration  Disp: once renal failure has improved   LOS: 11 days    Coralie Keens 02/21/2014, 7:58 AM Pager: [phone]

## 2014-02-22 ENCOUNTER — Inpatient Hospital Stay (HOSPITAL_COMMUNITY): Payer: Medicare Other

## 2014-02-22 ENCOUNTER — Encounter (INDEPENDENT_AMBULATORY_CARE_PROVIDER_SITE_OTHER): Payer: Self-pay

## 2014-02-22 LAB — BASIC METABOLIC PANEL
BUN: 14 mg/dL (ref 6–23)
CALCIUM: 8.7 mg/dL (ref 8.4–10.5)
CHLORIDE: 106 meq/L (ref 96–112)
CO2: 24 meq/L (ref 19–32)
Creatinine, Ser: 2.68 mg/dL — ABNORMAL HIGH (ref 0.50–1.10)
GFR calc Af Amer: 20 mL/min — ABNORMAL LOW (ref >90)
GFR calc non Af Amer: 17 mL/min — ABNORMAL LOW (ref >90)
GLUCOSE: 100 mg/dL — AB (ref 70–99)
Potassium: 3.6 mEq/L — ABNORMAL LOW (ref 3.7–5.3)
SODIUM: 142 meq/L (ref 137–147)

## 2014-02-22 LAB — CBC
HEMATOCRIT: 23.2 % — AB (ref 36.0–46.0)
Hemoglobin: 7.5 g/dL — ABNORMAL LOW (ref 12.0–15.0)
MCH: 29.4 pg (ref 26.0–34.0)
MCHC: 32.3 g/dL (ref 30.0–36.0)
MCV: 91 fL (ref 78.0–100.0)
PLATELETS: 184 10*3/uL (ref 150–400)
RBC: 2.55 MIL/uL — ABNORMAL LOW (ref 3.87–5.11)
RDW: 14.8 % (ref 11.5–15.5)
WBC: 16.8 10*3/uL — AB (ref 4.0–10.5)

## 2014-02-22 LAB — GLUCOSE, CAPILLARY
GLUCOSE-CAPILLARY: 120 mg/dL — AB (ref 70–99)
GLUCOSE-CAPILLARY: 95 mg/dL (ref 70–99)
Glucose-Capillary: 85 mg/dL (ref 70–99)

## 2014-02-22 NOTE — Progress Notes (Signed)
[facility] PROGRESS NOTE  Martha Evans WYO:[id number] DOB: [birth year] DOA: 02/10/2014 PCP: [name], MD  Assessment/Plan: Leukocytosis/HCAP -CXR with LLL patchy infiltrate. -Suspicious for PNA. -Patient describes a mild cough and some SOB. -Treatment for HCAP has been started with zosyn/zyvox.  Repeat CXR today and taper down antibiotics.  -Would treat for 8-10 days total.  ARF -Cr continue to decrease to 2.68 today. -Expect continued improvement. -Renal US without obstruction/hydronephrosis. -Continue IVF. -Not on nephrotoxic meds.  Colon Perforation -S/p colectomy with ostomy. -Management as per surgery.  Leukocytosis -Continues to trend downwards. -Likely related to HCAP, abdominal infection and G-CSF received after last chemo session 10 days ago.  Code Status: Full Code Family Communication: family at bedside.  Disposition Plan: To be determined     Antibiotics:  Zosyn  Zyvox   Subjective: Her leg edema is improving.   Objective: Filed Vitals:   02/21/14 0552 02/21/14 1420 02/21/14 2115 02/22/14 0630  BP: 132/68 150/75 137/71 107/62  Pulse: 57 70 63 69  Temp: 97.7 F (36.5 C) 97.9 F (36.6 C) 98 F (36.7 C) 98.3 F (36.8 C)  TempSrc: Oral Oral Oral Oral  Resp: 16 16 18 18   Height:      Weight:      SpO2: 95% 96% 95% 98%    Intake/Output Summary (Last 24 hours) at 02/22/14 1442 Last data filed at 02/22/14 1000  Gross per 24 hour  Intake    200 ml  Output   1675 ml  Net  -1475 ml   Filed Weights   02/11/14 2115 02/13/14 0002 02/14/14 0400  Weight: 83.462 kg (184 lb) 90.7 kg (199 lb 15.3 oz) 87.8 kg (193 lb 9 oz)    Exam:   General:  AA Ox3  Cardiovascular: RRR  Respiratory: CTA B  Abdomen: S/NT/ND/+BS  Extremities: 1+edema bilaterally   Neurologic:  Non-focal  Data Reviewed: Basic Metabolic Panel:  Recent Labs Lab 02/16/14 0455  02/18/14 0020 02/18/14 0505 02/19/14 0448 02/20/14 0416  02/21/14 0505 02/22/14 0530  NA 145  < >  --  142 141 140 140 142  K 3.0*  < >  --  3.6* 3.6* 3.7 3.7 3.6*  CL 104  < >  --  104 104 104 106 106  CO2 31  < >  --  26 26 25 24 24   GLUCOSE 100*  < >  --  99 89 101* 103* 100*  BUN 17  < >  --  22 19 18 16 14   CREATININE 0.98  < >  --  3.00* 3.02* 2.95* 2.88* 2.68*  CALCIUM 8.4  < >  --  8.3* 8.5 8.4 8.5 8.7  MG 2.2  --  2.2  --   --   --   --   --   < > = values in this interval not displayed. Liver Function Tests: No results found for this basename: AST, ALT, ALKPHOS, BILITOT, PROT, ALBUMIN,  in the last 168 hours No results found for this basename: LIPASE, AMYLASE,  in the last 168 hours No results found for this basename: AMMONIA,  in the last 168 hours CBC:  Recent Labs Lab 02/18/14 0505 02/19/14 0915 02/20/14 0416 02/21/14 1045 02/22/14 0530  WBC 23.0* 25.6* 23.1* 21.0* 16.8*  NEUTROABS  --  22.5*  --   --   --   HGB 8.1* 8.3* 7.9* 8.1* 7.5*  HCT 24.4* 25.6* 24.1* 24.9* 23.2*  MCV 91.0 91.4 90.9 90.5 91.0  PLT 291 272 207 191 184   Cardiac Enzymes: No results found for this basename: CKTOTAL, CKMB, CKMBINDEX, TROPONINI,  in the last 168 hours BNP (last 3 results) No results found for this basename: PROBNP,  in the last 8760 hours CBG:  Recent Labs Lab 02/21/14 1210 02/21/14 1641 02/21/14 2206 02/22/14 0753 02/22/14 1221  GLUCAP 137* 98 115* 85 120*    Recent Results (from the past 240 hour(s))  URINE CULTURE     Status: None   Collection Time    02/17/14  4:39 PM      Result Value Ref Range Status   Specimen Description URINE, RANDOM   Final   Special Requests NONE   Final   Culture  Setup Time     Final   Value: 02/17/2014 23:38     Performed at [facility]     Final   Value: NO GROWTH     Performed at [facility]   Culture     Final   Value: NO GROWTH     Performed at [facility]   Report Status 02/18/2014 FINAL   Final     Studies: No results  found.  Scheduled Meds: . feeding supplement (ENSURE COMPLETE)  237 mL Oral BID BM  . heparin subcutaneous  5,000 Units Subcutaneous Q8H  . linezolid  600 mg Intravenous Q12H  . pantoprazole  40 mg Oral Daily  . piperacillin-tazobactam (ZOSYN)  IV  3.375 g Intravenous Q8H   Continuous Infusions: . sodium chloride 50 mL/hr at 02/21/14 1128    Principal Problem:   Perforated sigmoid colon Active Problems:   Septic shock(785.52)   ARF (acute renal failure)   Leukocytosis, unspecified    Time spent: 25 minutes. Greater than 50% of this time was spent in direct contact with the patient coordinating care.    Hosie Poisson  [facility] Pager [phone]  If 7PM-7AM, please contact night-coverage at [email or web address], password [email or web address] 02/22/2014, 2:42 PM  LOS: 12 days

## 2014-02-22 NOTE — Progress Notes (Signed)
11 Days Post-Op  Subjective: Feels good.  No N/V, having BM's and flatus in bag.  Ambulating OOB.  Tolerating diet.  Wound vac fine.  Awaiting WBC/Cr improvement.  Objective: Vital signs in last 24 hours: Temp:  [97.9 F (36.6 C)-98.3 F (36.8 C)] 98.3 F (36.8 C) (05/27 0630) Pulse Rate:  [63-70] 69 (05/27 0630) Resp:  [16-18] 18 (05/27 0630) BP: (107-150)/(62-75) 107/62 mmHg (05/27 0630) SpO2:  [95 %-98 %] 98 % (05/27 0630) Last BM Date:  (colostomy)  Intake/Output from previous day: 05/26 0701 - 05/27 0700 In: 1385.8 [P.O.:240; I.V.:1145.8] Out: 2325 [Urine:2125; Drains:25; Stool:175] Intake/Output this shift:    PE: Gen:  Alert, NAD, pleasant Abd: Soft, minimal tenderness, ND, +BS, no HSM, midline wound with wound vac in place, serosanguinous drainage in vac canister, colostomy working with brown stools\ LE:  Edema improved   Lab Results:   Recent Labs  02/21/14 1045 02/22/14 0530  WBC 21.0* 16.8*  HGB 8.1* 7.5*  HCT 24.9* 23.2*  PLT 191 184   BMET  Recent Labs  02/21/14 0505 02/22/14 0530  NA 140 142  K 3.7 3.6*  CL 106 106  CO2 24 24  GLUCOSE 103* 100*  BUN 16 14  CREATININE 2.88* 2.68*  CALCIUM 8.5 8.7   PT/INR No results found for this basename: LABPROT, INR,  in the last 72 hours CMP     Component Value Date/Time   NA 142 02/22/2014 0530   NA 140 02/06/2014 1050   K 3.6* 02/22/2014 0530   K 3.9 02/06/2014 1050   CL 106 02/22/2014 0530   CO2 24 02/22/2014 0530   CO2 25 02/06/2014 1050   GLUCOSE 100* 02/22/2014 0530   GLUCOSE 108 02/06/2014 1050   BUN 14 02/22/2014 0530   BUN 15.3 02/06/2014 1050   CREATININE 2.68* 02/22/2014 0530   CREATININE 0.8 02/06/2014 1050   CALCIUM 8.7 02/22/2014 0530   CALCIUM 9.5 02/06/2014 1050   PROT 5.5* 02/15/2014 0329   PROT 6.9 02/06/2014 1050   ALBUMIN 2.2* 02/15/2014 0329   ALBUMIN 3.1* 02/06/2014 1050   AST 19 02/15/2014 0329   AST 11 02/06/2014 1050   ALT 21 02/15/2014 0329   ALT 15 02/06/2014 1050   ALKPHOS 107  02/15/2014 0329   ALKPHOS 130 02/06/2014 1050   BILITOT 0.6 02/15/2014 0329   BILITOT 0.34 02/06/2014 1050   GFRNONAA 17* 02/22/2014 0530   GFRAA 20* 02/22/2014 0530   Lipase  No results found for this basename: lipase       Studies/Results: No results found.  Anti-infectives: Anti-infectives   Start     Dose/Rate Route Frequency Ordered Stop   02/21/14 1600  piperacillin-tazobactam (ZOSYN) IVPB 3.375 g     3.375 g 12.5 mL/hr over 240 Minutes Intravenous Every 8 hours 02/21/14 0943     02/18/14 1330  linezolid (ZYVOX) IVPB 600 mg     600 mg 300 mL/hr over 60 Minutes Intravenous Every 12 hours 02/18/14 1217     02/18/14 1000  piperacillin-tazobactam (ZOSYN) IVPB 2.25 g  Status:  Discontinued     2.25 g 100 mL/hr over 30 Minutes Intravenous Every 8 hours 02/18/14 0759 02/21/14 0942   02/12/14 1000  vancomycin (VANCOCIN) IVPB 1000 mg/200 mL premix  Status:  Discontinued     1,000 mg 200 mL/hr over 60 Minutes Intravenous Every 12 hours 02/12/14 0537 02/17/14 0633   02/11/14 2130  vancomycin (VANCOCIN) 1,250 mg in sodium chloride 0.9 % 250 mL IVPB  1,250 mg 166.7 mL/hr over 90 Minutes Intravenous  Once 02/11/14 2117 02/11/14 2310   02/11/14 2030  micafungin (MYCAMINE) 100 mg in sodium chloride 0.9 % 100 mL IVPB  Status:  Discontinued     100 mg 100 mL/hr over 1 Hours Intravenous Daily at bedtime 02/11/14 1939 02/16/14 0805   02/11/14 1800  piperacillin-tazobactam (ZOSYN) IVPB 3.375 g  Status:  Discontinued     3.375 g 12.5 mL/hr over 240 Minutes Intravenous Every 8 hours 02/11/14 1150 02/18/14 0759   02/11/14 0330  piperacillin-tazobactam (ZOSYN) IVPB 3.375 g  Status:  Discontinued     3.375 g 12.5 mL/hr over 240 Minutes Intravenous  Once 02/11/14 0318 02/11/14 0320   02/11/14 0330  piperacillin-tazobactam (ZOSYN) IVPB 3.375 g     3.375 g 100 mL/hr over 30 Minutes Intravenous  Once 02/11/14 0321 02/11/14 0410       Assessment/Plan Perforated colon/pneumoperitoneum POD #11  s/p EXPLORATORY LAPAROTOMY, COLON RESECTION, COLOSTOMY  Pneumonia  AKF - Cr. improved at 2.68  Leukocytosis - improved to 16.8  Plan:  Tolerating regular diet  Continue wound vac until discharge  Continue zosyn and zyvox for possible pneumonia, appreciate medicines help (how long will she need to continue these?) CT neg  Ambulate/IS  SCD's and heparin for dvt proph  Continue gentle IV hydration 56mL/hr Push oral hydration   Disp: once renal failure has improved, WBC improving     LOS: 12 days    Coralie Keens 02/22/2014, 7:20 AM Pager: [phone]

## 2014-02-22 NOTE — Progress Notes (Signed)
Telephone order from [place] that Pt may be canceled per patient's request Neta Mends RN 02-22-2014 12:36pm

## 2014-02-23 LAB — BASIC METABOLIC PANEL
BUN: 13 mg/dL (ref 6–23)
CALCIUM: 8.6 mg/dL (ref 8.4–10.5)
CHLORIDE: 105 meq/L (ref 96–112)
CO2: 25 meq/L (ref 19–32)
Creatinine, Ser: 2.55 mg/dL — ABNORMAL HIGH (ref 0.50–1.10)
GFR calc Af Amer: 21 mL/min — ABNORMAL LOW (ref >90)
GFR calc non Af Amer: 19 mL/min — ABNORMAL LOW (ref >90)
GLUCOSE: 104 mg/dL — AB (ref 70–99)
Potassium: 3.9 mEq/L (ref 3.7–5.3)
SODIUM: 141 meq/L (ref 137–147)

## 2014-02-23 LAB — CBC
HCT: 22.8 % — ABNORMAL LOW (ref 36.0–46.0)
Hemoglobin: 7.4 g/dL — ABNORMAL LOW (ref 12.0–15.0)
MCH: 29.6 pg (ref 26.0–34.0)
MCHC: 32.5 g/dL (ref 30.0–36.0)
MCV: 91.2 fL (ref 78.0–100.0)
Platelets: 173 10*3/uL (ref 150–400)
RBC: 2.5 MIL/uL — AB (ref 3.87–5.11)
RDW: 14.8 % (ref 11.5–15.5)
WBC: 15.5 10*3/uL — ABNORMAL HIGH (ref 4.0–10.5)

## 2014-02-23 LAB — CREATININE, URINE, RANDOM: Creatinine, Urine: 38 mg/dL

## 2014-02-23 LAB — SODIUM, URINE, RANDOM: Sodium, Ur: 79 mEq/L

## 2014-02-23 MED ORDER — LEVOFLOXACIN 500 MG PO TABS: 500.0000 mg | ORAL_TABLET | ORAL | Status: DC

## 2014-02-23 MED ORDER — LEVOFLOXACIN 750 MG PO TABS
750.0000 mg | ORAL_TABLET | Freq: Every day | ORAL | Status: AC
  Administered 2014-02-23: 750 mg via ORAL
  Filled 2014-02-23: qty 1

## 2014-02-23 NOTE — Progress Notes (Signed)
Seen and agree  

## 2014-02-23 NOTE — Consult Note (Addendum)
Requesting Physician:  [facility] Reason for Consult:  Acute kidney injury HPI: The patient is a 68 y.o. year-old female with PMH significant for HTN, GERD, breast cancer (undergoing chemo with docataxel/CTX/decaddron) Presented to the ED on 5/16 with several days of constipation, abdominal discomfort and fever. Imaging studies revealed pneumoperitoneum and abscesses along the left colon gutter and sigmoid colon. She underwent surgery and was found to have a perforation of the proximal left colon with abscess along the left colonic gutter - had removal of the left colon and splenic flexure, Hartmann pouch and left transverse colon end colostomy by Dr. Excell Seltzer on 02/11/14.   Baseline creatinine was normal, increased on the 6th post op day from 0.98 to 2.11 and subsequently peaked at 3.02 on 5/24.  Has been slowly falling since that time.  She developed hypotension post op (septic shock) requiring neosynephrine 5/16-5/19   She received ATB's that included vancomycin earlier in the admission and had elevated level of 33.6 on 5/22 at which time it was discontinued. Level was till at 23.8 24 hours post discontinuation  She received 3 doses of naprosyn between 5/21 and 5/22 Was on Micardis PTA but has not received any this hospitalization Received IV contrast on the day of admission, but AKI not manifest until POD#6  UOP transiently dropped to 800 cc/24 hours on 5/23 but since that time has had excellent UOP 2-2.5 liters/day Still getting IVF at 50/hour.  Presently she reports nausea that she attributes to the Zyvox, but has had no vomiting and is keeping down fluids well.  She is somewhat dyspneic with exertion and has some residual edema.  Had some blleding from her wound VAC last PM and was concerned about that.  Creatinine trending is as follows:  Creatinine, Ser  Date/Time Value Ref Range Status  02/23/2014  4:36 AM 2.55* 0.50 - 1.10 mg/dL Final  02/22/2014  5:30 AM 2.68* 0.50 - 1.10  mg/dL Final  02/21/2014  5:05 AM 2.88* 0.50 - 1.10 mg/dL Final  02/20/2014  4:16 AM 2.95* 0.50 - 1.10 mg/dL Final  02/19/2014  4:48 AM 3.02* 0.50 - 1.10 mg/dL Final  02/18/2014  5:05 AM 3.00* 0.50 - 1.10 mg/dL Final  02/17/2014  1:50 PM 2.54* (vanco level ~23) 0.50 - 1.10 mg/dL Final  02/17/2014  4:47 AM 2.11* (naprosyn and vanco d/c'd; vanco level ~33) 0.50 - 1.10 mg/dL Final  02/16/2014  4:55 AM 0.98   (rec'd naprosyn) 0.50 - 1.10 mg/dL Final  02/15/2014  3:29 AM 1.05  0.50 - 1.10 mg/dL Final  02/14/2014  3:17 AM 0.72   (phenylephrine stopped) 0.50 - 1.10 mg/dL Final  02/13/2014  3:33 AM 0.61  0.50 - 1.10 mg/dL Final  02/12/2014  4:30 AM 0.59  0.50 - 1.10 mg/dL Final  02/11/2014  9:09 PM 0.60  0.50 - 1.10 mg/dL Final  02/11/2014 12:30 AM 0.80   (iv contrast; surgery; shock; phenylephrine for hypotension) 0.50 - 1.10 mg/dL Final  01/02/2014  5:48 AM 0.81  0.50 - 1.10 mg/dL Final  12/07/2013  3:16 PM 0.99  0.50 - 1.10 mg/dL Final     Past Medical History  Diagnosis Date  . Hypertension   . Allergy   . Wears hearing aid     right  . GERD (gastroesophageal reflux disease)   . Complication of anesthesia     was hard to intubate with thyroid surgery-99  . Difficult intubation 1999    when had thyroid lobectomy  . Dysrhythmia  irregular reuglar heart beat   . Cancer     breast cancer     Past Surgical History  Procedure Laterality Date  . Thyroid lobectomy  1999    right  . Rotator cuff repair  2006    right shoulder  . Breast reduction surgery Bilateral     1980's  . Abdominal hysterectomy  1999  . Tonsillectomy      as child  . Appendectomy      as child  . Colonoscopy    . Partial mastectomy with needle localization and axillary sentinel lymph node bx Right 12/12/2013    Procedure: PARTIAL MASTECTOMY WITH NEEDLE LOCALIZATION AND AXILLARY SENTINEL LYMPH NODE BX;  Surgeon: Earnstine Regal, MD;  Location: [facility];  Service: General;  Laterality: Right;  . Portacath  placement Left 01/02/2014    Procedure: INSERTION PORT-A-CATH;  Surgeon: Earnstine Regal, MD;  Location: [facility];  Service: General;  Laterality: Left;  . Laparotomy N/A 02/11/2014    Procedure: EXPLORATORY LAPAROTOMY, COLON RESECTION, COLOSTOMY;  Surgeon: Shann Medal, MD;  Location: [facility];  Service: General;  Laterality: N/A;    Family History  Problem Relation Age of Onset  . Cancer Father     bladder & pancreatic   Social History:  reports that she has never smoked. She has never used smokeless tobacco. She reports that she drinks alcohol. She reports that she does not use illicit drugs.  Allergies  Allergen Reactions  . Codeine Sulfate Nausea And Vomiting  . Tramadol Nausea Only    Nausea   . Hydrocodone Nausea Only  . Oxycodone Nausea Only    Home medications: Prior to Admission medications   Medication Sig Start Date End Date Taking? Authorizing Provider  calcium carbonate (TUMS - DOSED IN MG ELEMENTAL CALCIUM) 500 MG chewable tablet Chew 1 tablet by mouth daily. As needed   Yes Historical Provider, MD  calcium-vitamin D (OSCAL WITH D) 500-200 MG-UNIT per tablet Take 1 tablet by mouth daily.    Yes Historical Provider, MD  dexamethasone (DECADRON) 4 MG tablet Take 2 tablets (8 mg total) by mouth 2 (two) times daily. Start the day before Taxotere. Then again the day after chemo for 3 days. 12/21/13  Yes [name], MD  lidocaine-prilocaine (EMLA) cream Apply topically as needed. 12/21/13  Yes [name], MD  loratadine (CLARITIN) 10 MG tablet Take 10 mg by mouth daily. lst dose 12/11/13 12/21/13  Yes Historical Provider, MD  LORazepam (ATIVAN) 0.5 MG tablet Take 1 tablet (0.5 mg total) by mouth every 6 (six) hours as needed (Nausea or vomiting). 12/21/13  Yes [name], MD  omeprazole (PRILOSEC) 20 MG capsule Take 20 mg by mouth daily.   Yes Historical Provider, MD  ondansetron (ZOFRAN) 8 MG tablet Take 1 tablet (8 mg total) by mouth 2 (two) times daily. Start the day after  chemo for 3 days. Then take as needed for nausea or vomiting. 12/21/13  Yes [name], MD  polyethylene glycol (MIRALAX / GLYCOLAX) packet Take 17 g by mouth daily as needed for mild constipation.   Yes Historical Provider, MD  prochlorperazine (COMPAZINE) 10 MG tablet Take 1 tablet (10 mg total) by mouth every 6 (six) hours as needed (Nausea or vomiting). 12/21/13  Yes [name], MD  sennosides-docusate sodium (SENOKOT-S) 8.6-50 MG tablet Take 1 tablet by mouth daily.   Yes Historical Provider, MD  telmisartan (MICARDIS) 40 MG tablet Take 40 mg  by mouth daily.   Yes Historical Provider, MD    Inpatient medications: . feeding supplement (ENSURE COMPLETE)  237 mL Oral BID BM  . heparin subcutaneous  5,000 Units Subcutaneous Q8H  . [START ON 02/25/2014] levofloxacin  500 mg Oral Q48H  . pantoprazole  40 mg Oral Daily   . sodium chloride 50 mL (02/23/14 0013)   Review of Systems Gen:  Denies headache, fever, chills, sweats. + alopecia from chemo HEENT:  No visual change, sore throat, difficulty swallowing. Resp:  + DOE.  No cough or hemoptysis. Cardiac:  No chest pain, orthopnea, PND.  + edema since hosp GI:   Appropriate post surgical abdominal discomfort. + nausea, but no vomiting, diarrhea.  Colostomy draining stool. GU:  Denies difficulty or change in voiding.  No change in urine color.     MS:  Denies joint pain or swelling.   Derm:  Denies skin rash or itching.  No chronic skin conditions.  Neuro:   Denies focal weakness, memory problems, hx stroke or TIA.   Psych:  Denies symptoms of depression of anxiety.  No hallucination.     Physical Exam:  Blood pressure 154/82, pulse 64, temperature 98.4 F (36.9 C), temperature source Oral, resp. rate 18, height 5' 6" (1.676 m), weight 87.8 kg (193 lb 9 oz), SpO2 98.00%.  Gen: very nice middle aged WF  Diffuse Alopecia Skin: no rash, cyanosis Neck: no JVD, no bruits or LAN; port in left anterior chest is accessed with fluids  infusing Chest: Clear posteriorly Heart: regular, no rub or gallop Abdomen: Midline wound VAC. Colostomy draining.  Ext: 1+ edema bilat LE's Neuro: alert, Ox3, no focal deficit  Labs:  Recent Labs Lab 02/17/14 1350 02/18/14 0505 02/19/14 0448 02/20/14 0416 02/21/14 0505 02/22/14 0530 02/23/14 0436  NA 140 142 141 140 140 142 141  K 3.4* 3.6* 3.6* 3.7 3.7 3.6* 3.9  CL 99 104 104 104 106 106 105  CO2 _0 GLUCOSE 154* 99 89 101* 103* 100* 104*  BUN _1 CREATININE 2.54* 3.00* 3.02* 2.95* 2.88* 2.68* 2.55*  CALCIUM 8.8 8.3* 8.5 8.4 8.5 8.7 8.6   Recent Labs Lab 02/19/14 0915 02/20/14 0416 02/21/14 1045 02/22/14 0530 02/23/14 0436  WBC 25.6* 23.1* 21.0* 16.8* 15.5*  NEUTROABS 22.5*  --   --   --   --   HGB 8.3* 7.9* 8.1* 7.5* 7.4*  HCT 25.6* 24.1* 24.9* 23.2* 22.8*  MCV 91.4 90.9 90.5 91.0 91.2  PLT 272 207 191 184 173   Recent Labs Lab 02/21/14 1641 02/21/14 2206 02/22/14 0753 02/22/14 1221 02/22/14 1744  GLUCAP 98 115* 85 120* 95   Results for EVANN, KOELZER (MRN [id number]) as of 02/23/2014 15:46  Ref. Range 02/17/2014 16:39  Color, Urine Latest Range: YELLOW  YELLOW  APPearance Latest Range: CLEAR  CLOUDY (A)  Specific Gravity, Urine Latest Range: 1.005-1.030  1.016  pH Latest Range: 5.0-8.0  5.0  Glucose Latest Range: NEGATIVE mg/dL NEGATIVE  Bilirubin Urine Latest Range: NEGATIVE  NEGATIVE  Ketones, ur Latest Range: NEGATIVE mg/dL NEGATIVE  Protein Latest Range: NEGATIVE mg/dL 30 (A)  Urobilinogen, UA Latest Range: 0.0-1.0 mg/dL 1.0  Nitrite Latest Range: NEGATIVE  NEGATIVE  Leukocytes, UA Latest Range: NEGATIVE  NEGATIVE  Hgb urine dipstick Latest Range: NEGATIVE  NEGATIVE  WBC, UA Latest Range: <3 WBC/hpf 0-2  RBC / HPF Latest Range: <3 RBC/hpf 0-2  Squamous Epithelial /  LPF Latest Range: RARE  FEW (A)  Bacteria, UA Latest Range: RARE  FEW (A)   Xrays/Other Studies: Renal ultrasound: no hydro; kidneys normal in  size  Dg Chest 2 View  02/22/2014   CLINICAL DATA:  Evaluate for improving pneumonia.  EXAM: CHEST  2 VIEW  COMPARISON:  02/17/2014  FINDINGS: Left subclavian Port-A-Cath remains in place with tip overlying the mid SVC. Surgical clips are present in the lower right neck. Cardiac silhouette is slightly enlarged, unchanged. There is improved aeration of the left lung base with minimal patchy left lower lobe opacity remaining. There is a persistent, small left pleural effusion. Right lung remains clear. No acute osseous abnormality is identified.  IMPRESSION: Improved aeration of the left lower lobe with minimal patchy opacity remaining. Persistent, small left pleural effusion.   Electronically Signed   By: Logan Bores   On: 02/22/2014 14:41   Impression/Plan  1. Non oliguric AKI - has occured in the setting of bowel perforation, septic shock with a compilation of several insults including hypotension, NSAID's (albeit limited doses), and elevated vanco levels (perhaps as a consequence of rather than sig cause for the AKI). She has remained non-oliguric, has had no issues with potassium or acid-base in the last 5 days, and creatinine is slowly improving daily. I think if her creatinine continues to improve OFF IVF then she could be safely discharged to home from a nephrology [place], but would recommend that pt have labs at least twice a week until renal function has returned to baseline.  Since [facility] will be coming to the home to change wound VAC, labs could be drawn at that time, followed up by surgery. 2. S/p colostomy for repair of perforated colon - wound VAC/management per [facility] 3. Breast cancer - no further chemo until wound completely healed; per Oncology 4. HCAP - Antibiotics per primary service 5. Anemia - surgery _ chemo rec'd past 2 weeks.  Check Fe studies. 6. Leukocytosis - resolving infection    Jamal Maes,  MD [facility] [phone] pager 02/23/2014, 3:49 PM

## 2014-02-23 NOTE — Progress Notes (Signed)
12 Days Post-Op  Subjective: Pt doing well.  Didn't sleep well last night.  Pain minimal.  Wound vac changed yesterday.  Tolerating diet.  Ambulating well.  Colostomy working well.  No N/V.    Objective: Vital signs in last 24 hours: Temp:  [97.6 F (36.4 C)-98.3 F (36.8 C)] 97.6 F (36.4 C) (05/28 0628) Pulse Rate:  [66-76] 76 (05/28 0628) Resp:  [16-18] 16 (05/28 0628) BP: (128-150)/(64-81) 128/81 mmHg (05/28 0628) SpO2:  [94 %-96 %] 96 % (05/28 0628) Last BM Date:  (colostomy)  Intake/Output from previous day: 05/27 0701 - 05/28 0700 In: -  Out: 2500 [Urine:2350; Drains:25; Stool:125] Intake/Output this shift:    PE: Gen:  Alert, NAD, pleasant Abd: Soft, minimal tenderness, ND, +BS, no HSM, midline wound with wound vac in place (wound bed clean after vac change yesterday), sanguinous drainage in vac canister, colostomy working with brown stools LE: Edema improved b/l   Lab Results:   Recent Labs  02/22/14 0530 02/23/14 0436  WBC 16.8* 15.5*  HGB 7.5* 7.4*  HCT 23.2* 22.8*  PLT 184 173   BMET  Recent Labs  02/22/14 0530 02/23/14 0436  NA 142 141  K 3.6* 3.9  CL 106 105  CO2 24 25  GLUCOSE 100* 104*  BUN 14 13  CREATININE 2.68* 2.55*  CALCIUM 8.7 8.6   PT/INR No results found for this basename: LABPROT, INR,  in the last 72 hours CMP     Component Value Date/Time   NA 141 02/23/2014 0436   NA 140 02/06/2014 1050   K 3.9 02/23/2014 0436   K 3.9 02/06/2014 1050   CL 105 02/23/2014 0436   CO2 25 02/23/2014 0436   CO2 25 02/06/2014 1050   GLUCOSE 104* 02/23/2014 0436   GLUCOSE 108 02/06/2014 1050   BUN 13 02/23/2014 0436   BUN 15.3 02/06/2014 1050   CREATININE 2.55* 02/23/2014 0436   CREATININE 0.8 02/06/2014 1050   CALCIUM 8.6 02/23/2014 0436   CALCIUM 9.5 02/06/2014 1050   PROT 5.5* 02/15/2014 0329   PROT 6.9 02/06/2014 1050   ALBUMIN 2.2* 02/15/2014 0329   ALBUMIN 3.1* 02/06/2014 1050   AST 19 02/15/2014 0329   AST 11 02/06/2014 1050   ALT 21 02/15/2014  0329   ALT 15 02/06/2014 1050   ALKPHOS 107 02/15/2014 0329   ALKPHOS 130 02/06/2014 1050   BILITOT 0.6 02/15/2014 0329   BILITOT 0.34 02/06/2014 1050   GFRNONAA 19* 02/23/2014 0436   GFRAA 21* 02/23/2014 0436   Lipase  No results found for this basename: lipase       Studies/Results: Dg Chest 2 View  02/22/2014   CLINICAL DATA:  Evaluate for improving pneumonia.  EXAM: CHEST  2 VIEW  COMPARISON:  02/17/2014  FINDINGS: Left subclavian Port-A-Cath remains in place with tip overlying the mid SVC. Surgical clips are present in the lower right neck. Cardiac silhouette is slightly enlarged, unchanged. There is improved aeration of the left lung base with minimal patchy left lower lobe opacity remaining. There is a persistent, small left pleural effusion. Right lung remains clear. No acute osseous abnormality is identified.  IMPRESSION: Improved aeration of the left lower lobe with minimal patchy opacity remaining. Persistent, small left pleural effusion.   Electronically Signed   By: Logan Bores   On: 02/22/2014 14:41    Anti-infectives: Anti-infectives   Start     Dose/Rate Route Frequency Ordered Stop   02/21/14 1600  piperacillin-tazobactam (ZOSYN) IVPB 3.375 g  3.375 g 12.5 mL/hr over 240 Minutes Intravenous Every 8 hours 02/21/14 0943     02/18/14 1330  linezolid (ZYVOX) IVPB 600 mg     600 mg 300 mL/hr over 60 Minutes Intravenous Every 12 hours 02/18/14 1217     02/18/14 1000  piperacillin-tazobactam (ZOSYN) IVPB 2.25 g  Status:  Discontinued     2.25 g 100 mL/hr over 30 Minutes Intravenous Every 8 hours 02/18/14 0759 02/21/14 0942   02/12/14 1000  vancomycin (VANCOCIN) IVPB 1000 mg/200 mL premix  Status:  Discontinued     1,000 mg 200 mL/hr over 60 Minutes Intravenous Every 12 hours 02/12/14 0537 02/17/14 0633   02/11/14 2130  vancomycin (VANCOCIN) 1,250 mg in sodium chloride 0.9 % 250 mL IVPB     1,250 mg 166.7 mL/hr over 90 Minutes Intravenous  Once 02/11/14 2117 02/11/14 2310    02/11/14 2030  micafungin (MYCAMINE) 100 mg in sodium chloride 0.9 % 100 mL IVPB  Status:  Discontinued     100 mg 100 mL/hr over 1 Hours Intravenous Daily at bedtime 02/11/14 1939 02/16/14 0805   02/11/14 1800  piperacillin-tazobactam (ZOSYN) IVPB 3.375 g  Status:  Discontinued     3.375 g 12.5 mL/hr over 240 Minutes Intravenous Every 8 hours 02/11/14 1150 02/18/14 0759   02/11/14 0330  piperacillin-tazobactam (ZOSYN) IVPB 3.375 g  Status:  Discontinued     3.375 g 12.5 mL/hr over 240 Minutes Intravenous  Once 02/11/14 0318 02/11/14 0320   02/11/14 0330  piperacillin-tazobactam (ZOSYN) IVPB 3.375 g     3.375 g 100 mL/hr over 30 Minutes Intravenous  Once 02/11/14 0321 02/11/14 0410     Antibiotics: Zyvox Day #6 Zosyn Day #13 Vanc Completed 5 days  Assessment/Plan Perforated colon/pneumoperitoneum POD #12 s/p EXPLORATORY LAPAROTOMY, COLON RESECTION, COLOSTOMY  Pneumonia  AKF - Cr. improved at 2.55 Leukocytosis - improved to 15.5  Plan:  Tolerating regular diet  Continue wound vac at discharge Continue zosyn and zyvox for possible pneumonia, appreciate medicines help CT neg  Ambulate/IS  SCD's and heparin for dvt proph  Continue gentle IV hydration 76mL/hr  Push oral hydration   Disp: Pt is stable for discharge from a surgical perspective, but awaiting renal failure to improve.  Will need 8-10 days of antibiotics for pneumonia 2-4 more days of Zyvox or can she be switched to orals given improvement in CXR.  Would ask medicine to recommend date of discharge based on pneumonia and renal failure.     LOS: 13 days    Coralie Keens 02/23/2014, 7:07 AM Pager: [phone]

## 2014-02-23 NOTE — Progress Notes (Signed)
[facility] PROGRESS NOTE  Martha Evans NID:[id number] DOB: 18-Sep-1946 DOA: 02/10/2014 PCP: [name], MD  Assessment/Plan: Leukocytosis/HCAP -CXR with LLL patchy infiltrate, Suspicious for PNA. Completed  5 days of IV antibiotics and repeat CXR shows improving pneumonia. Have transitioned to oral levaquin for 3 more day s to complete the course.   ARF -Cr continue to decrease to 2.55 today. -Expect continued improvement. -Renal US without obstruction/hydronephrosis. -Not on nephrotoxic meds. Renal consulted. Can d/c  Home if renal function improved off fluids.   Colon Perforation -S/p colectomy with ostomy. -Management as per surgery.  Leukocytosis -Continues to trend downwards. -Likely related to HCAP, abdominal infection and G-CSF received after last chemo session 10 days ago.  Code Status: Full Code Family Communication: family at bedside.  Disposition Plan: To be determined     Antibiotics:  levaquin for 95m ore days.   Subjective: Her leg edema is improving.   Objective: Filed Vitals:   02/22/14 0630 02/22/14 1400 02/22/14 2152 02/23/14 0628  BP: 107/62 144/77 150/64 128/81  Pulse: 69 73 66 76  Temp: 98.3 F (36.8 C) 98.3 F (36.8 C) 97.7 F (36.5 C) 97.6 F (36.4 C)  TempSrc: Oral Oral Oral Oral  Resp: 18 18 16 16   Height:      Weight:      SpO2: 98% 96% 94% 96%    Intake/Output Summary (Last 24 hours) at 02/23/14 0943 Last data filed at 02/23/14 1443  Gross per 24 hour  Intake      0 ml  Output   2500 ml  Net  -2500 ml   Filed Weights   02/11/14 2115 02/13/14 0002 02/14/14 0400  Weight: 83.462 kg (184 lb) 90.7 kg (199 lb 15.3 oz) 87.8 kg (193 lb 9 oz)    Exam:   General:  AA Ox3  Cardiovascular: RRR  Respiratory: CTA B  Abdomen: S/NT/ND/+BS  Extremities: 1+edema bilaterally   Neurologic:  Non-focal  Data Reviewed: Basic Metabolic Panel:  Recent Labs Lab 02/18/14 0020  02/19/14 0448 02/20/14 0416  02/21/14 0505 02/22/14 0530 02/23/14 0436  NA  --   < > 141 140 140 142 141  K  --   < > 3.6* 3.7 3.7 3.6* 3.9  CL  --   < > 104 104 106 106 105  CO2  --   < > 26 25 24 24 25   GLUCOSE  --   < > 89 101* 103* 100* 104*  BUN  --   < > 19 18 16 14 13   CREATININE  --   < > 3.02* 2.95* 2.88* 2.68* 2.55*  CALCIUM  --   < > 8.5 8.4 8.5 8.7 8.6  MG 2.2  --   --   --   --   --   --   < > = values in this interval not displayed. Liver Function Tests: No results found for this basename: AST, ALT, ALKPHOS, BILITOT, PROT, ALBUMIN,  in the last 168 hours No results found for this basename: LIPASE, AMYLASE,  in the last 168 hours No results found for this basename: AMMONIA,  in the last 168 hours CBC:  Recent Labs Lab 02/19/14 0915 02/20/14 0416 02/21/14 1045 02/22/14 0530 02/23/14 0436  WBC 25.6* 23.1* 21.0* 16.8* 15.5*  NEUTROABS 22.5*  --   --   --   --   HGB 8.3* 7.9* 8.1* 7.5* 7.4*  HCT 25.6* 24.1* 24.9* 23.2* 22.8*  MCV 91.4 90.9 90.5 91.0 91.2  PLT 272 207 191 184 173   Cardiac Enzymes: No results found for this basename: CKTOTAL, CKMB, CKMBINDEX, TROPONINI,  in the last 168 hours BNP (last 3 results) No results found for this basename: PROBNP,  in the last 8760 hours CBG:  Recent Labs Lab 02/21/14 1641 02/21/14 2206 02/22/14 0753 02/22/14 1221 02/22/14 1744  GLUCAP 98 115* 85 120* 95    Recent Results (from the past 240 hour(s))  URINE CULTURE     Status: None   Collection Time    02/17/14  4:39 PM      Result Value Ref Range Status   Specimen Description URINE, RANDOM   Final   Special Requests NONE   Final   Culture  Setup Time     Final   Value: 02/17/2014 23:38     Performed at [place]     Final   Value: NO GROWTH     Performed at [facility]   Culture     Final   Value: NO GROWTH     Performed at [facility]   Report Status 02/18/2014 FINAL   Final     Studies: Dg Chest 2 View  02/22/2014   CLINICAL  DATA:  Evaluate for improving pneumonia.  EXAM: CHEST  2 VIEW  COMPARISON:  02/17/2014  FINDINGS: Left subclavian Port-A-Cath remains in place with tip overlying the mid SVC. Surgical clips are present in the lower right neck. Cardiac silhouette is slightly enlarged, unchanged. There is improved aeration of the left lung base with minimal patchy left lower lobe opacity remaining. There is a persistent, small left pleural effusion. Right lung remains clear. No acute osseous abnormality is identified.  IMPRESSION: Improved aeration of the left lower lobe with minimal patchy opacity remaining. Persistent, small left pleural effusion.   Electronically Signed   By: Logan Bores   On: 02/22/2014 14:41    Scheduled Meds: . feeding supplement (ENSURE COMPLETE)  237 mL Oral BID BM  . heparin subcutaneous  5,000 Units Subcutaneous Q8H  . levofloxacin  750 mg Oral Daily  . pantoprazole  40 mg Oral Daily   Continuous Infusions: . sodium chloride 50 mL (02/23/14 0013)    Principal Problem:   Perforated sigmoid colon Active Problems:   Septic shock(785.52)   ARF (acute renal failure)   Leukocytosis, unspecified    Time spent: 25 minutes. Greater than 50% of this time was spent in direct contact with the patient coordinating care.    Hosie Poisson  [facility] Pager [phone]  If 7PM-7AM, please contact night-coverage at [email or web address], password [facility] 02/23/2014, 9:43 AM  LOS: 13 days

## 2014-02-23 NOTE — Consult Note (Signed)
WOC ostomy follow up Stoma type/location: LLQ Colostomy Stomal assessment/size: 1 and 5/8 inches round, moist and budded.  Os at center Peristomal assessment: intact, clear Treatment options for stomal/peristomal skin: none indicated Output Soft brown stool. Ostomy pouching: 1pc./2pc.  Education provided: Patient and I discussed the passing and function of mucous from the rectum and she is relieved to know this is normal.  Patient passed a small mucous "ball" this afternoon and was waiting to discuss this with me. Patient observed ostomy pouching system change and is asking appropriate questions. Affixed the pouch to the skin barrier this evening x2.  Is independent in pouch emptying.  We made several toilet paper "wicks' this pm and they will be used for her pouch emptying efforts tomorrow.  I will come tomorrow to change her NPWT dressing. Enrolled patient in [facility] Discharge program: Yes [facility] nursing team will follow, andwill remain available to this patient, the nursing, surgical and medical teams. Thanks, Maudie Flakes, MSN, RN, [place], [place], [place] [phone])

## 2014-02-24 LAB — CBC
HCT: 23.1 % — ABNORMAL LOW (ref 36.0–46.0)
HEMOGLOBIN: 7.7 g/dL — AB (ref 12.0–15.0)
MCH: 30.6 pg (ref 26.0–34.0)
MCHC: 33.3 g/dL (ref 30.0–36.0)
MCV: 91.7 fL (ref 78.0–100.0)
Platelets: 180 10*3/uL (ref 150–400)
RBC: 2.52 MIL/uL — AB (ref 3.87–5.11)
RDW: 15 % (ref 11.5–15.5)
WBC: 14 10*3/uL — ABNORMAL HIGH (ref 4.0–10.5)

## 2014-02-24 LAB — BASIC METABOLIC PANEL
BUN: 13 mg/dL (ref 6–23)
CO2: 26 meq/L (ref 19–32)
Calcium: 9.1 mg/dL (ref 8.4–10.5)
Chloride: 105 mEq/L (ref 96–112)
Creatinine, Ser: 2.47 mg/dL — ABNORMAL HIGH (ref 0.50–1.10)
GFR calc Af Amer: 22 mL/min — ABNORMAL LOW (ref >90)
GFR calc non Af Amer: 19 mL/min — ABNORMAL LOW (ref >90)
Glucose, Bld: 95 mg/dL (ref 70–99)
POTASSIUM: 3.9 meq/L (ref 3.7–5.3)
SODIUM: 142 meq/L (ref 137–147)

## 2014-02-24 LAB — IRON AND TIBC
Iron: 93 ug/dL (ref 42–135)
Saturation Ratios: 46 % (ref 20–55)
TIBC: 202 ug/dL — ABNORMAL LOW (ref 250–470)
UIBC: 109 ug/dL — AB (ref 125–400)

## 2014-02-24 MED ORDER — LEVOFLOXACIN 500 MG PO TABS: 500.0000 mg | ORAL_TABLET | ORAL | Status: DC

## 2014-02-24 MED ORDER — HEPARIN SOD (PORK) LOCK FLUSH 100 UNIT/ML IV SOLN
500.0000 [IU] | INTRAVENOUS | Status: AC | PRN
  Administered 2014-02-24: 500 [IU]

## 2014-02-24 NOTE — Discharge Instructions (Signed)
[facility]      [facility] [phone]  OPEN ABDOMINAL SURGERY: POST OP INSTRUCTIONS  Always review your discharge instruction sheet given to you by the facility where your surgery was performed.  IF YOU HAVE DISABILITY OR FAMILY LEAVE FORMS, YOU MUST BRING THEM TO THE OFFICE FOR PROCESSING.  PLEASE DO NOT GIVE THEM TO YOUR DOCTOR.  1. A prescription for pain medication may be given to you upon discharge.  Take your pain medication as prescribed, if needed.  If narcotic pain medicine is not needed, then you may take acetaminophen (Tylenol) or ibuprofen (Advil) as needed. 2. Take your usually prescribed medications unless otherwise directed. 3. If you need a refill on your pain medication, please contact your pharmacy. They will contact our office to request authorization.  Prescriptions will not be filled after 5pm or on week-ends. 4. You should follow a light diet the first few days after arrival home, such as soup and crackers, pudding, etc.unless your doctor has advised otherwise. A high-fiber, low fat diet can be resumed as tolerated.   Be sure to include lots of fluids daily. Most patients will experience some swelling and bruising on the chest and neck area.  Ice packs will help.  Swelling and bruising can take several days to resolve 5. Most patients will experience some swelling and bruising in the area of the incision. Ice pack will help. Swelling and bruising can take several days to resolve..  6. It is common to experience some constipation if taking pain medication after surgery.  Increasing fluid intake and taking a stool softener will usually help or prevent this problem from occurring.  A mild laxative (Milk of Magnesia or Miralax) should be taken according to package directions if there are no bowel movements after 48 hours. 7.  You may have steri-strips (small skin tapes) in place directly over the incision.  These strips should be left on the skin for 7-10 days.  If your  surgeon used skin glue on the incision, you may shower in 24 hours.  The glue will flake off over the next 2-3 weeks.  Any sutures or staples will be removed at the office during your follow-up visit. You may find that a light gauze bandage over your incision may keep your staples from being rubbed or pulled. You may shower and replace the bandage daily. 8. ACTIVITIES:  You may resume regular (light) daily activities beginning the next day--such as daily self-care, walking, climbing stairs--gradually increasing activities as tolerated.  You may have sexual intercourse when it is comfortable.  Refrain from any heavy lifting or straining until approved by your doctor. a. You may drive when you no longer are taking prescription pain medication, you can comfortably wear a seatbelt, and you can safely maneuver your car and apply brakes b. Return to Work: ___________________________________ 9. You should see your doctor in the office for a follow-up appointment approximately two weeks after your surgery.  Make sure that you call for this appointment within a day or two after you arrive home to insure a convenient appointment time. OTHER INSTRUCTIONS:  _____________________________________________________________ _____________________________________________________________  WHEN TO CALL YOUR DOCTOR: 1. Fever over 101.0 2. Inability to urinate 3. Nausea and/or vomiting 4. Extreme swelling or bruising 5. Continued bleeding from incision. 6. Increased pain, redness, or drainage from the incision. 7. Difficulty swallowing or breathing 8. Muscle cramping or spasms. 9. Numbness or tingling in hands or feet or around lips.  The clinic staff is available to   answer your questions during regular business hours.  Please don't hesitate to call and ask to speak to one of the nurses if you have concerns.  For further questions, please visit [email or web address]   

## 2014-02-24 NOTE — Progress Notes (Signed)
Porta cath deaccessed. Flushed with 10cc NS followed by Heparin 5ml (100u/ml). No bleeding to site, band aid to site for comfort. Jud Fanguy M 

## 2014-02-24 NOTE — Consult Note (Signed)
WOC ostomy follow up Stoma type/location: LLQ Colostomy Stomal assessment/size: slightly larger than 1 and 1/2 inches round, budded, moist Peristomal assessment: intact, clear Treatment options for stomal/peristomal skin: skin barrier ring Output soft brown stool Ostomy pouching: 2pc. , 2 and 1/4 inch pouching system Education provided: Patient instructed regarding new stoma size and questions answered about flushing out of pouch. Ready for discharge from [facility] Nurse perspective. Enrolled patient in [place] Start Discharge program: Yes   WOC wound follow up Wound type:Surgical Measurement:19cm x 5.5cm x 4cm Wound MBW:GYKZL, pink and granulating Drainage (amount, consistency, odor) minimal serosanguinous Periwound:intact, clear Dressing procedure/placement/frequency:NPWT initiated after removal of old dressing and wound cleanse with NS.  1 piece of silicone wound contact layer placed (mepitel) and 2 pieces of black foam used to fill defect. Seal achieved and patient connected to home V.A.C. Unit with 163mmHg of continuous negative pressure. Tolerated procedure well.  [place] nursing team will not follow, but will remain available to this patient, the nursing, surgical and medical teams post discharge. Thanks, Maudie Flakes, MSN, RN, [place], [place], [place] [phone])

## 2014-02-24 NOTE — Progress Notes (Signed)
02/24/14 1100  Clinical Encounter Type  Visited With Patient  Visit Type Follow-up;Spiritual support;Social support  Referral From Chaplain Loann Quill, MDiv)  Spiritual Encounters  Spiritual Needs Emotional  Stress Factors  Patient Stress Factors Health changes   Kindsey was in good spirits today, feeling grateful to be well enough to go home and to have a close friend driving down from [place] for the weekend to provide emotional and logistical support to [place] and her husband Jenny Reichmann as they acclimate to caring for her physical needs at home.  Darin speaks with a bright heart about sources of comfort and ultimate meaning in her life, and gratitude is a strong coping tool.  Per pt, her family is small and limited, so the support of friends is very important in her life.  Provided pastoral presence, reflective listening, prayer, and encouragement.  [place], [place]

## 2014-02-24 NOTE — Discharge Summary (Signed)
Physician Discharge Summary  Patient ID: Martha Evans MRN: [id number] DOB/AGE: Oct 25, 1945 68 y.o.  Admit date: 02/10/2014 Discharge date: 02/24/2014  Admitting Diagnosis: Pneumoperitoneum Left colonic gutter abscess Sigmoid colon perforation Free air Right breast cancer T1, Nx (invasive ductal ca)  Discharge Diagnosis Patient Active Problem List   Diagnosis Date Noted  . ARF (acute renal failure) 02/17/2014  . Leukocytosis, unspecified 02/17/2014  . Septic shock(785.52) 02/12/2014  . Perforated sigmoid colon 02/11/2014  . Breast cancer of lower-outer quadrant of right female breast 12/07/2013  . Multiple thyroid nodules, left lobe 07/25/2013  . History of thyroid lobectomy, right 07/25/2013    Consultants Dr. Jamal Maes (Nephrology) Dr. Haroldine Laws (CCM) Isidoro Donning [facility] nursing) Dr. Jerilee Hoh, Dr. Karleen Hampshire (Internal medicine) Charlestine Massed (NP-Oncology)   Imaging: Dg Chest 2 View  02/22/2014   CLINICAL DATA:  Evaluate for improving pneumonia.  EXAM: CHEST  2 VIEW  COMPARISON:  02/17/2014  FINDINGS: Left subclavian Port-A-Cath remains in place with tip overlying the mid SVC. Surgical clips are present in the lower right neck. Cardiac silhouette is slightly enlarged, unchanged. There is improved aeration of the left lung base with minimal patchy left lower lobe opacity remaining. There is a persistent, small left pleural effusion. Right lung remains clear. No acute osseous abnormality is identified.  IMPRESSION: Improved aeration of the left lower lobe with minimal patchy opacity remaining. Persistent, small left pleural effusion.   Electronically Signed   By: Logan Bores   On: 02/22/2014 14:41    Procedures Dr. Lucia Gaskins (02/24/14) - Exploratory laparotomy with removal of left colon and splenic flexure (mobilization of the splenic flexure), Hartmann pouch, and left transverse colon end colostomy.   Hospital Course:  68 y.o. (DOB: 1946-01-17) white female  presented to [facility] today for abdominal pain. She did have some constipation following cycle 1 of chemotherapy. She got her Neulasta shot Tuesday, 5/12, and developed abdominal pain. On Wednesday, the pain was became worse, but she thought it was due to her chemotx. She felt a little better Thursday, 5/14, but on Friday, started feeling worse again. She had a temp of 100.3. She came to the [facility] for further evaluation. She has no history of colon disease. She thinks that she had a colonoscopy in 2009 in [place]. Her prior abdominal surgery - appendectomy 1960, hysterectomy for endometriosis - 1999, abdominoplasty - 1990, "lipoma" from skiing accident removed 1980's.  Abdominal CT scan - 02/11/2014 - Perforated sigmoid diverticulitis, with small to moderate amount of pneumoperitoneum. 17 x 23 mm intramural abscess of the segment colon, in addition to a 16 x 34 x 51 mm abscess within the left pericolic gutter. Moderate amount of retained large bowel stool without bowel obstruction. Partially imaged 6.8 x 4.5 cm right cystic breast mass, this could reflect seroma, recommend correlation with surgical history.   Patient was admitted and underwent procedure listed above.  Tolerated procedure well and was transferred to the ICU for resuscitation.  She was maintained on a ventimask, and pressure support.  CCM helped to manage her while in the ICU.  She was weaned off the pressors on 02/13/14.  Dressing changes were started to her midline wound and continued on zosyn.  Eventually she was able to have a wound vac placed which was changed MWF.  She was transferred to the floor on 02/15/14 and started on a clear liquid diet as her bowel function resumed.  Diet was advanced as tolerated.  Creatinine was noted to be elevated on 02/17/14, this started  to trend down and eventually settled at 2.4 prior to discharge without IVF.  Her vancomycin was discontinued due to kidney stress.  Renal US was negative as well as urine studies.   Nephrology (Dr. Lorrene Reid) was consulted who noted non-oliguric AKI in the setting of bowel perforation, septic shock (hypotension), NSAIDS, and vanco levels.  She was found to have a post-operative pneumonia and was started on Zyvox.  Prior to discharge she was started on Levaquin.  Concern for elevated WBC thus repeat CT was obtained which was negative.  WBC count continued to improve as well.  On the day of discharge it was improved to 14.0.  On POD #13, the patient was voiding well, tolerating diet, ambulating well, pain well controlled, vital signs stable, incisions c/d/i and felt stable for discharge home.  Patient will follow up in our office in 2 weeks and knows to call with questions or concerns.  She will be sent home with [facility] nursing for ostomy care, and wound vac management.  She will have her wound vac changed M/W/F.  Medicine has recommended resuming home meds upon discharge.  She will continue 1 more dose of Levoquin to finish out a course of antibiotics for pneumonia.  Given renal dosage she will take one last pill tomorrow (02/25/14) and be finished.  She will continue to get BMET's twice a week until her creatinine is below 1.5.  The [facility] nurses can drawl this from her port-a-cath.   Physical Exam: General:  Alert, NAD, pleasant, comfortable Abd: Soft, minimal tenderness, ND, +BS, no HSM, midline wound with wound vac in place, sanguinous drainage in vac canister, colostomy working with brown stools  LE: Edema improved b/l     Medication List         calcium carbonate 500 MG chewable tablet  Commonly known as:  TUMS - dosed in mg elemental calcium  Chew 1 tablet by mouth daily. As needed     calcium-vitamin D 500-200 MG-UNIT per tablet  Commonly known as:  OSCAL WITH D  Take 1 tablet by mouth daily.     dexamethasone 4 MG tablet  Commonly known as:  DECADRON  Take 2 tablets (8 mg total) by mouth 2 (two) times daily. Start the day before Taxotere. Then again the day after chemo for 3  days.     levofloxacin 500 MG tablet  Commonly known as:  LEVAQUIN  Take 1 tablet (500 mg total) by mouth every other day.  Start taking on:  02/25/2014     lidocaine-prilocaine cream  Commonly known as:  EMLA  Apply topically as needed.     loratadine 10 MG tablet  Commonly known as:  CLARITIN  Take 10 mg by mouth daily. lst dose 12/11/13     LORazepam 0.5 MG tablet  Commonly known as:  ATIVAN  Take 1 tablet (0.5 mg total) by mouth every 6 (six) hours as needed (Nausea or vomiting).     omeprazole 20 MG capsule  Commonly known as:  PRILOSEC  Take 20 mg by mouth daily.     ondansetron 8 MG tablet  Commonly known as:  ZOFRAN  Take 1 tablet (8 mg total) by mouth 2 (two) times daily. Start the day after chemo for 3 days. Then take as needed for nausea or vomiting.     polyethylene glycol packet  Commonly known as:  MIRALAX / GLYCOLAX  Take 17 g by mouth daily as needed for mild constipation.     prochlorperazine 10  MG tablet  Commonly known as:  COMPAZINE  Take 1 tablet (10 mg total) by mouth every 6 (six) hours as needed (Nausea or vomiting).     sennosides-docusate sodium 8.6-50 MG tablet  Commonly known as:  SENOKOT-S  Take 1 tablet by mouth daily.     telmisartan 40 MG tablet  Commonly known as:  MICARDIS  Take 40 mg by mouth daily.             Follow-up Information   Follow up with [facility], MD. Schedule an appointment as soon as possible for a visit in 2 weeks. (For post-operation check)    Specialty:  General Surgery   Contact information:   [place] [address] [phone]       Follow up with [name], MD.   Specialty:  Family Medicine   Contact information:   [address] [place] [address] [phone]       Schedule an appointment as soon as possible for a visit with Marcy Panning, MD.   Specialty:  Oncology   Contact information:   [place]  [address] [phone]       Call DUNHAM,CYNTHIA B, MD. (As needed regarding your kidney function tests)    Specialty:  Nephrology   Contact information:   [place] [address] [phone]       Signed: Coralie Keens, [facility] [phone]  02/24/2014, 9:48 AM

## 2014-02-24 NOTE — Progress Notes (Signed)
[facility] PROGRESS NOTE  Martha Evans NLG:[id number] DOB: 13-Aug-1946 DOA: 02/10/2014 PCP: [name], MD  Assessment/Plan: Leukocytosis/HCAP -CXR with LLL patchy infiltrate, Suspicious for PNA. Completed  5 days of IV antibiotics and repeat CXR shows improving pneumonia. Have transitioned to oral levaquin for 2 more day s to complete the course.   ARF -Cr continue to decrease to 2.47 today. -Expect continued improvement. -Renal US without obstruction/hydronephrosis. -Not on nephrotoxic meds. - renal consulted and stopped the fluids and creatinine continued to improve. Can be discharged with close follow up of renal function, by PCP. If her renal function still remains greater than 2 after 3 to 4 weeks , please get referral from your PCP to see Dr Lorrene Reid.  I/O last 3 completed shifts: In: 0814 [P.O.:720; I.V.:1100] Out: 5725 [Urine:5550; Stool:175]       Colon Perforation -S/p colectomy with ostomy. -Management as per surgery.  Leukocytosis -Continues to trend downwards. -Likely related to HCAP, abdominal infection and G-CSF received after last chemo session 10 days ago.  Code Status: Full Code Family Communication: family at bedside.  Disposition Plan: To be determined     Antibiotics:  levaquin for 2 more days.   Subjective: Her leg edema is improving.   Objective: Filed Vitals:   02/23/14 0628 02/23/14 1400 02/23/14 2116 02/24/14 0538  BP: 128/81 154/82 153/92 126/74  Pulse: 76 64 66 72  Temp: 97.6 F (36.4 C) 98.4 F (36.9 C) 98.1 F (36.7 C) 97.6 F (36.4 C)  TempSrc: Oral Oral Oral Oral  Resp: 16 18 18 16   Height:      Weight:      SpO2: 96% 98% 96% 95%    Intake/Output Summary (Last 24 hours) at 02/24/14 0716 Last data filed at 02/24/14 0630  Gross per 24 hour  Intake   1820 ml  Output   3750 ml  Net  -1930 ml   Filed Weights   02/11/14 2115 02/13/14 0002 02/14/14 0400  Weight: 83.462 kg (184 lb) 90.7 kg (199 lb 15.3  oz) 87.8 kg (193 lb 9 oz)    Exam:   General:  AA Ox3  Cardiovascular: RRR  Respiratory: CTA B  Abdomen: S/NT/ND/+BS  Extremities: 1+edema bilaterally   Neurologic:  Non-focal  Data Reviewed: Basic Metabolic Panel:  Recent Labs Lab 02/18/14 0020  02/20/14 0416 02/21/14 0505 02/22/14 0530 02/23/14 0436 02/24/14 0525  NA  --   < > 140 140 142 141 142  K  --   < > 3.7 3.7 3.6* 3.9 3.9  CL  --   < > 104 106 106 105 105  CO2  --   < > 25 24 24 25 26   GLUCOSE  --   < > 101* 103* 100* 104* 95  BUN  --   < > 18 16 14 13 13   CREATININE  --   < > 2.95* 2.88* 2.68* 2.55* 2.47*  CALCIUM  --   < > 8.4 8.5 8.7 8.6 9.1  MG 2.2  --   --   --   --   --   --   < > = values in this interval not displayed. Liver Function Tests: No results found for this basename: AST, ALT, ALKPHOS, BILITOT, PROT, ALBUMIN,  in the last 168 hours No results found for this basename: LIPASE, AMYLASE,  in the last 168 hours No results found for this basename: AMMONIA,  in the last 168 hours CBC:  Recent Labs Lab 02/19/14  0915 02/20/14 0416 02/21/14 1045 02/22/14 0530 02/23/14 0436 02/24/14 0525  WBC 25.6* 23.1* 21.0* 16.8* 15.5* 14.0*  NEUTROABS 22.5*  --   --   --   --   --   HGB 8.3* 7.9* 8.1* 7.5* 7.4* 7.7*  HCT 25.6* 24.1* 24.9* 23.2* 22.8* 23.1*  MCV 91.4 90.9 90.5 91.0 91.2 91.7  PLT 272 207 191 184 173 180   Cardiac Enzymes: No results found for this basename: CKTOTAL, CKMB, CKMBINDEX, TROPONINI,  in the last 168 hours BNP (last 3 results) No results found for this basename: PROBNP,  in the last 8760 hours CBG:  Recent Labs Lab 02/21/14 1641 02/21/14 2206 02/22/14 0753 02/22/14 1221 02/22/14 1744  GLUCAP 98 115* 85 120* 95    Recent Results (from the past 240 hour(s))  URINE CULTURE     Status: None   Collection Time    02/17/14  4:39 PM      Result Value Ref Range Status   Specimen Description URINE, RANDOM   Final   Special Requests NONE   Final   Culture  Setup Time      Final   Value: 02/17/2014 23:38     Performed at [place]     Final   Value: NO GROWTH     Performed at [facility]   Culture     Final   Value: NO GROWTH     Performed at [facility]   Report Status 02/18/2014 FINAL   Final     Studies: Dg Chest 2 View  02/22/2014   CLINICAL DATA:  Evaluate for improving pneumonia.  EXAM: CHEST  2 VIEW  COMPARISON:  02/17/2014  FINDINGS: Left subclavian Port-A-Cath remains in place with tip overlying the mid SVC. Surgical clips are present in the lower right neck. Cardiac silhouette is slightly enlarged, unchanged. There is improved aeration of the left lung base with minimal patchy left lower lobe opacity remaining. There is a persistent, small left pleural effusion. Right lung remains clear. No acute osseous abnormality is identified.  IMPRESSION: Improved aeration of the left lower lobe with minimal patchy opacity remaining. Persistent, small left pleural effusion.   Electronically Signed   By: Logan Bores   On: 02/22/2014 14:41    Scheduled Meds: . feeding supplement (ENSURE COMPLETE)  237 mL Oral BID BM  . heparin subcutaneous  5,000 Units Subcutaneous Q8H  . [START ON 02/25/2014] levofloxacin  500 mg Oral Q48H  . pantoprazole  40 mg Oral Daily   Continuous Infusions: . sodium chloride 50 mL (02/23/14 0013)    Principal Problem:   Perforated sigmoid colon Active Problems:   Septic shock(785.52)   ARF (acute renal failure)   Leukocytosis, unspecified    Time spent: 25 minutes. Greater than 50% of this time was spent in direct contact with the patient coordinating care.    Hosie Poisson  [facility] Pager [phone]  If 7PM-7AM, please contact night-coverage at [email or web address], password [facility] 02/24/2014, 7:16 AM  LOS: 14 days

## 2014-02-27 ENCOUNTER — Ambulatory Visit: Payer: Medicare Other | Admitting: Adult Health

## 2014-02-27 ENCOUNTER — Telehealth (INDEPENDENT_AMBULATORY_CARE_PROVIDER_SITE_OTHER): Payer: Self-pay

## 2014-02-27 ENCOUNTER — Other Ambulatory Visit: Payer: Medicare Other

## 2014-02-27 ENCOUNTER — Ambulatory Visit: Payer: Medicare Other

## 2014-02-27 NOTE — Telephone Encounter (Signed)
Patient was discharged on Friday , She states she is doing ok and [facility] came out saturday for elevation , they will start wound care today . Post appointment is scheduled for 03-09-14 5p . Advised patient to call if she had any questions/ concerns.

## 2014-02-28 ENCOUNTER — Ambulatory Visit: Payer: Medicare Other

## 2014-03-01 ENCOUNTER — Other Ambulatory Visit: Payer: Self-pay

## 2014-03-01 ENCOUNTER — Telehealth (INDEPENDENT_AMBULATORY_CARE_PROVIDER_SITE_OTHER): Payer: Self-pay | Admitting: General Surgery

## 2014-03-01 ENCOUNTER — Telehealth (INDEPENDENT_AMBULATORY_CARE_PROVIDER_SITE_OTHER): Payer: Self-pay

## 2014-03-01 NOTE — Telephone Encounter (Signed)
[facility] ask if patient should have CBC along with her BMP ,[facility] and feels they may need it. Advised [facility] RN BMP is to be drawn BID until creatinine level is below 1.5 per [facility]  and any labs needed by the [facility] will be done the day of her visit with [facility].

## 2014-03-01 NOTE — Progress Notes (Signed)
Patient discharged 6/1.  Per pt. she sees Dr. Lucia Gaskins 6/11, however it is her understanding that it may be a few months before she can return to chemo as colon will be too fragile.  Let patient know to contact us when she is cleared to restart chemo and we will get her back on the schedule.  Pt voiced understanding.

## 2014-03-02 ENCOUNTER — Telehealth: Payer: Self-pay | Admitting: Oncology

## 2014-03-02 NOTE — Telephone Encounter (Signed)
per 6/3 pof cx all [facility] appts - pt will call clinic when she is cleared for tx

## 2014-03-06 ENCOUNTER — Ambulatory Visit: Payer: Medicare Other | Admitting: Adult Health

## 2014-03-06 ENCOUNTER — Ambulatory Visit: Payer: Medicare Other

## 2014-03-06 ENCOUNTER — Ambulatory Visit: Payer: Medicare Other | Admitting: Oncology

## 2014-03-06 ENCOUNTER — Other Ambulatory Visit: Payer: Medicare Other

## 2014-03-07 ENCOUNTER — Telehealth: Payer: Self-pay

## 2014-03-07 NOTE — Telephone Encounter (Signed)
MD Lucia Gaskins suggested pt call to get on GM schedule to discuss re-starting chemo.  Let her know GM is not available until July.  Pt to see [facility] 6/11.  Let pt know we need to know that surgeon has cleared her to start chemo again and that once happens we should be able to get her on the schedule within about a week.  Pt voiced understanding.

## 2014-03-08 ENCOUNTER — Telehealth (INDEPENDENT_AMBULATORY_CARE_PROVIDER_SITE_OTHER): Payer: Self-pay

## 2014-03-08 NOTE — Telephone Encounter (Signed)
Home health nurse calling to report a creatinine level of 1.59.  Orders upon d/c from the hospital from Dr. Lucia Gaskins and Dr. Lorrene Reid (nephrologist), were to draw weekly BMET until pt's level was less than 1.5.  Reporting  Creatinine of 1.59 at today's blood draw.  Dr. Lucia Gaskins is unavailable so advised [facility] nurse to contact Dr. Lorrene Reid for further instructions.

## 2014-03-09 ENCOUNTER — Ambulatory Visit (INDEPENDENT_AMBULATORY_CARE_PROVIDER_SITE_OTHER): Payer: Medicare Other | Admitting: Surgery

## 2014-03-09 ENCOUNTER — Encounter (INDEPENDENT_AMBULATORY_CARE_PROVIDER_SITE_OTHER): Payer: Self-pay | Admitting: Surgery

## 2014-03-09 VITALS — BP 140/78 | HR 90 | Temp 98.5°F | Ht 66.0 in | Wt 177.0 lb

## 2014-03-09 DIAGNOSIS — C50519 Malignant neoplasm of lower-outer quadrant of unspecified female breast: Secondary | ICD-10-CM

## 2014-03-09 DIAGNOSIS — C50511 Malignant neoplasm of lower-outer quadrant of right female breast: Secondary | ICD-10-CM

## 2014-03-09 DIAGNOSIS — K631 Perforation of intestine (nontraumatic): Secondary | ICD-10-CM

## 2014-03-09 MED ORDER — CEPHALEXIN 500 MG PO CAPS: 500.0000 mg | ORAL_CAPSULE | Freq: Three times a day (TID) | ORAL | Status: AC

## 2014-03-09 NOTE — Progress Notes (Addendum)
[place], MD,  [address]  [place], [place]    [place] Phone:  [phone] FAX:  [phone]   Re:   Martha Evans DOB:   Oct 22, 1945 MRN:   [id number]  ASSESSMENT AND PLAN: 1.  Exploratory laparotomy with removal of left colon and splenic flexure (mobilization of the splenic flexure), Hartmann pouch, and left transverse colon end colostomy - 02/11/2014 - D. Lafreda Casebeer  For perforated diverticular disease.  Hospitalized from 02/10/2014 to 02/24/2014  Path report to patient.  She'll see me back in 2 weeks.  [Not sure if she will complete her chemo or not. If not, I'm happy to see her back and can facilitate RT in [place] if she desires. Thanks for the update and amazing she has recovered so well. [place]   DN 03/13/2014]  1A.  Open wound with VAC  Size - 18 x 4 x 3 cm.  I will see her back in 2 weeks to check wound and see how the port is doing.  2. Right breast cancer, T1, Nx   Invasive ductal ca (metaplastic), 0.8 cm, Grade II-III, ER 0%, PR 0%, Ki-67 - 84%, HER-2/neu negative.   Has had 2 cycles of Docetaxel and Cyclophosphamide - Dr. Katherina Mires Cornetto   Right partial mastectomy - 11/2013 - T. Gerkin   Sees Dr. Pablo Ledger for rad tx. She is interested in getting her rad tx in [place].  3. HTN  4. GERD  5. Left subclavian power port - April 2015 - Gerkin 6. Elevated creatinine  1.59 on 03/06/2014  She said that she had a blood draw yesterday, but I do not have those results.  She is going to get her Creatinine drawn either weekly or every other week. 7.  Redness around power port  To put on 10 days of Keflex, 500 mg TID  HISTORY OF PRESENT ILLNESS: Chief Complaint  Patient presents with  . Routine Post Op    Martha Evans is a 68 y.o. (DOB: 1946-08-21)  white  female who is a patient of [name], MD and comes to me today for follow up of a perforated colon. Her husband is with her, though he left  about mid way in the exam and did not come back. Considering everything, she is doing well. She is going to see Dr. Jana Hakim when he gets back at the end of July. She thinks that she is getting her strength and appetite back.  Past Medical History  Diagnosis Date  . Hypertension   . Allergy   . Wears hearing aid     right  . GERD (gastroesophageal reflux disease)   . Complication of anesthesia     was hard to intubate with thyroid surgery-99  . Difficult intubation 1999    when had thyroid lobectomy  . Dysrhythmia     irregular reuglar heart beat   . Cancer     breast cancer     SOCIAL HISTORY: Married.  PHYSICAL EXAM: BP 140/78  Pulse 90  Temp(Src) 98.5 F (36.9 C)  Ht _0  (1.676 m)  Wt 177 lb (80.287 kg)  BMI 28.58 kg/m2  General: WN WF who is alert and generally healthy appearing.  She has no hair. HEENT: Normal. Pupils equal. Good dentition. Neck: Supple. No mass.  No thyroid mass.   Lymph Nodes:  No supraclavicular or cervical nodes. Lungs: Clear to auscultation and symmetric breath sounds.  Her port is in the  upper left chest.  There is some redness and tenderness around the port.  The port has been used a lot recently.  I told her to get her blood drawn peripherally and I will give her antibiotics. Heart:  RRR. No murmur or rub. Abdomen: Soft. Ostomy in LUQ.  Wound with VAC - looks clean.  She is getting the VAC changed MWF.  DATA REVIEWED: Epic notes.   Alphonsa Overall, MD,  [facility], [address]  [place], [place]    [place] Phone:  [phone] FAX:  [phone]

## 2014-03-10 ENCOUNTER — Other Ambulatory Visit: Payer: Self-pay | Admitting: Adult Health

## 2014-03-15 ENCOUNTER — Other Ambulatory Visit: Payer: Self-pay | Admitting: Oncology

## 2014-03-17 ENCOUNTER — Telehealth: Payer: Self-pay | Admitting: Oncology

## 2014-03-17 NOTE — Telephone Encounter (Signed)
, °

## 2014-03-20 ENCOUNTER — Ambulatory Visit: Payer: Medicare Other | Admitting: Oncology

## 2014-03-20 ENCOUNTER — Ambulatory Visit: Payer: Medicare Other | Admitting: Adult Health

## 2014-03-20 ENCOUNTER — Ambulatory Visit: Payer: Medicare Other

## 2014-03-20 ENCOUNTER — Other Ambulatory Visit: Payer: Medicare Other

## 2014-03-21 ENCOUNTER — Ambulatory Visit: Payer: Medicare Other

## 2014-03-22 ENCOUNTER — Encounter (INDEPENDENT_AMBULATORY_CARE_PROVIDER_SITE_OTHER): Payer: Self-pay | Admitting: Surgery

## 2014-03-22 ENCOUNTER — Ambulatory Visit (INDEPENDENT_AMBULATORY_CARE_PROVIDER_SITE_OTHER): Payer: Medicare Other | Admitting: Surgery

## 2014-03-22 VITALS — BP 126/72 | HR 88 | Temp 97.0°F | Wt 176.0 lb

## 2014-03-22 DIAGNOSIS — C50519 Malignant neoplasm of lower-outer quadrant of unspecified female breast: Secondary | ICD-10-CM

## 2014-03-22 DIAGNOSIS — K631 Perforation of intestine (nontraumatic): Secondary | ICD-10-CM

## 2014-03-22 DIAGNOSIS — C50511 Malignant neoplasm of lower-outer quadrant of right female breast: Secondary | ICD-10-CM

## 2014-03-22 NOTE — Progress Notes (Signed)
[place], MD,  [address]  [place], [place]    [place] Phone:  [phone] FAX:  [phone]   Re:   Martha Evans DOB:   1946/09/13 MRN:   [id number]  ASSESSMENT AND PLAN: 1.  Exploratory laparotomy with removal of left colon and splenic flexure (mobilization of the splenic flexure), Hartmann pouch, and left transverse colon end colostomy - 02/11/2014 - D. Newman  For perforated diverticular disease.  Hospitalized from 02/10/2014 to 02/24/2014  I will see her back in one week to check her wound.  We discussed completing all breast ca treatment before reversal.  And I would wait 6 months anyway.  1A.  Open wound with VAC  Size - 18 x 4 x 3 cm.  I will see her back in one week and probably stop the VAC.  The center of the wound is a little deep and needs the sponge for the [facility] placed deep.  2. Right breast cancer, T1, Nx   Invasive ductal ca (metaplastic), 0.8 cm, Grade II-III, ER 0%, PR 0%, Ki-67 - 84%, HER-2/neu negative.   Has had 2 of 4 cycles of Docetaxel and Cyclophosphamide - Dr./LIndsey Cornetto.  She is to see Dr. Jana Hakim the second week of July.  Right lumpectomy - 12/13/2013 - T. Gerkin   Sees Dr. Pablo Ledger for rad tx. She is interested in getting her rad tx in [place].  But she is undecided at this time about where she is going to go.  [Not sure if she will complete her chemo or not. If not, I'm happy to see her back and can facilitate RT in [place] if she desires. [place]   DN 03/13/2014] 3. HTN  4. GERD  5. Left subclavian power port - April 2015 - Gerkin  Redness around port better, just about finished with Keflex. 6. Elevated creatinine  This is better.  HISTORY OF PRESENT ILLNESS: Chief Complaint  Patient presents with  . Breast Cancer Long Term Follow Up    Martha Evans is a 68 y.o. (DOB: 26-Mar-1946)  white  female who is a patient of [name], MD and comes to me today for follow  up of a perforated colon. She comes by herself.  She is going to see Dr. Jana Hakim when he gets back in the middle of July. She is getting better every week.  Past Medical History  Diagnosis Date  . Hypertension   . Allergy   . Wears hearing aid     right  . GERD (gastroesophageal reflux disease)   . Complication of anesthesia     was hard to intubate with thyroid surgery-99  . Difficult intubation 1999    when had thyroid lobectomy  . Dysrhythmia     irregular reuglar heart beat   . Cancer     breast cancer     SOCIAL HISTORY: Married.  PHYSICAL EXAM: BP 126/72  Pulse 88  Temp(Src) 97 F (36.1 C)  Wt 176 lb (79.833 kg)  General: WN WF who is alert and generally healthy appearing.  She has no hair. HEENT: Normal. Pupils equal. Good dentition. Neck: Supple. No mass.  No thyroid mass.   Lymph Nodes:  No supraclavicular or cervical nodes. Lungs: Clear to auscultation and symmetric breath sounds.  Her port is in the upper left chest looks much better than her last visit. Heart:  RRR. No murmur or rub. Abdomen: Soft. Ostomy in LUQ.  Wound with  VAC - looks clean.  She is getting the VAC changed MWF.  Needs to put sponge deeper in middle of wound.   Mid line wound.  DATA REVIEWED: Epic notes.  Alphonsa Overall, MD,  [facility], [address]  [place], [place]    [place] Phone:  [phone] FAX:  [phone]

## 2014-03-29 ENCOUNTER — Ambulatory Visit (INDEPENDENT_AMBULATORY_CARE_PROVIDER_SITE_OTHER): Payer: Medicare Other | Admitting: Surgery

## 2014-03-29 ENCOUNTER — Telehealth (INDEPENDENT_AMBULATORY_CARE_PROVIDER_SITE_OTHER): Payer: Self-pay

## 2014-03-29 ENCOUNTER — Encounter (INDEPENDENT_AMBULATORY_CARE_PROVIDER_SITE_OTHER): Payer: Self-pay | Admitting: Surgery

## 2014-03-29 VITALS — BP 124/76 | HR 74 | Temp 97.7°F | Resp 18 | Ht 64.0 in | Wt 178.0 lb

## 2014-03-29 DIAGNOSIS — K631 Perforation of intestine (nontraumatic): Secondary | ICD-10-CM

## 2014-03-29 DIAGNOSIS — C50519 Malignant neoplasm of lower-outer quadrant of unspecified female breast: Secondary | ICD-10-CM

## 2014-03-29 DIAGNOSIS — C50511 Malignant neoplasm of lower-outer quadrant of right female breast: Secondary | ICD-10-CM

## 2014-03-29 NOTE — Telephone Encounter (Signed)
[facility] will [facility] for new order ; D/C wound vac , start wet to dry dressing 3 x times a week until healed or patient feels comfortable with dressing changes per DR. [employer]

## 2014-03-29 NOTE — Progress Notes (Signed)
[place], MD,  [address]  [place], [place]    [place] Phone:  [phone] FAX:  [phone]   Re:   Martha Evans DOB:   November 14, 1945 MRN:   [id number]  ASSESSMENT AND PLAN: 1.  Exploratory laparotomy with removal of left colon and splenic flexure (mobilization of the splenic flexure), Hartmann pouch, and left transverse colon end colostomy - 02/11/2014 - D. China Deitrick  For perforated diverticular disease.  Hospitalized from 02/10/2014 to 02/24/2014  She will see me back in about 3 weeks.  1A.  Open wound with VAC  The wound is doing very well.  I will stop the VAC, go to BID dressing changes, renew follow up with [facility], and see me back in 3 weeks.  She is okay to restart chemotherapy.  2. Right breast cancer, T1, Nx   Invasive ductal ca (metaplastic), 0.8 cm, Grade II-III, ER 0%, PR 0%, Ki-67 - 84%, HER-2/neu negative.   Has had 2 of 4 cycles of Docetaxel and Cyclophosphamide - Dr./LIndsey Cornetto.  She is to see Dr. Jana Hakim the second week of July.  Right lumpectomy - 12/13/2013 - T. Gerkin   Sees Dr. Pablo Ledger for rad tx. She is interested in getting her rad tx in [place].  But she is undecided at this time about where she is going to go.   [Not sure if she will complete her chemo or not. If not, I'm happy to see her back and can facilitate RT in [place] if she desires. [place]   DN 03/13/2014] 3. HTN  4. GERD  5. Left subclavian power port - April 2015 - Gerkin  HISTORY OF PRESENT ILLNESS: Chief Complaint  Patient presents with  . Routine Post Op    colostomy    Martha Evans is a 68 y.o. (DOB: 1946-02-06)  white  female who is a patient of [name], MD and comes to me today for follow up of a perforated colon. She comes by herself. Her husband will not even look at the bandage. She is doing well.  She will get HHC to help her with the dressing.  Past Medical History  Diagnosis Date  .  Hypertension   . Allergy   . Wears hearing aid     right  . GERD (gastroesophageal reflux disease)   . Complication of anesthesia     was hard to intubate with thyroid surgery-99  . Difficult intubation 1999    when had thyroid lobectomy  . Dysrhythmia     irregular reuglar heart beat   . Cancer     breast cancer    SOCIAL HISTORY: Married. Her husband cannot look at her wounds.  PHYSICAL EXAM: BP 124/76  Pulse 74  Temp(Src) 97.7 F (36.5 C)  Resp 18  Ht _0  (1.626 m)  Wt 178 lb (80.74 kg)  BMI 30.54 kg/m2  General: WN WF who is alert and generally healthy appearing.  She has a little bit of hair coming in. HEENT: Normal. Pupils equal.  Neck: Supple. No mass.  No thyroid mass.   Lymph Nodes:  No supraclavicular or cervical nodes. Lungs: Clear to auscultation and symmetric breath sounds.  Her port is in the left upper chest. Heart:  RRR. No murmur or rub. Abdomen: Soft. Ostomy in LUQ.  Wound with VAC - looks clean.  The wound is healing well.  Will stop VAC and go to dressing changes.  DATA REVIEWED: Epic notes.  Alphonsa Overall, MD,  [facility], [address]  [place], [place]    [place] Phone:  [phone] FAX:  [phone]

## 2014-04-04 ENCOUNTER — Telehealth: Payer: Self-pay | Admitting: Dietician

## 2014-04-04 NOTE — Telephone Encounter (Signed)
Brief Outpatient Oncology Nutrition Note  Patient has been identified to be at risk on malnutrition screen.  Wt Readings from Last 10 Encounters:  03/29/14 178 lb (80.74 kg)  03/22/14 176 lb (79.833 kg)  03/09/14 177 lb (80.287 kg)  02/14/14 193 lb 9 oz (87.8 kg)  02/14/14 193 lb 9 oz (87.8 kg)  02/06/14 184 lb 1.6 oz (83.507 kg)  01/31/14 188 lb 1.6 oz (85.322 kg)  01/31/14 187 lb (84.823 kg)  01/23/14 183 lb 1.6 oz (83.054 kg)  01/16/14 188 lb 6.4 oz (85.458 kg)   Dx:  Invasive ductal breast cancer (metastatic) grade II-III, has had 2 cycles of Docetaxel and Cyclophosphamide.  Patient of Dr. Jana Hakim and Pablo Ledger for XRT.  Patient also s/p removal of left colon and splenic flexure with harmann pouch and left transverse colon end colostom 02/11/14 for perforated diverticular disease.  Did have wound VAC.  Called patient who reports wound VAC was removed last week and wound is healing well.  States that she is eating 3 meals daily plus Ensure and snacks as desired.  Has been focusing on protein intake as well.  Will mail patient coupons for Ensure and contact information for the [facility].  Antonieta Iba, RD, LDN

## 2014-04-07 ENCOUNTER — Telehealth: Payer: Self-pay | Admitting: Oncology

## 2014-04-07 ENCOUNTER — Telehealth: Payer: Self-pay | Admitting: *Deleted

## 2014-04-07 ENCOUNTER — Ambulatory Visit (HOSPITAL_BASED_OUTPATIENT_CLINIC_OR_DEPARTMENT_OTHER): Payer: Medicare Other | Admitting: Oncology

## 2014-04-07 VITALS — BP 137/71 | HR 49 | Temp 98.3°F | Resp 18 | Ht 64.0 in | Wt 178.8 lb

## 2014-04-07 DIAGNOSIS — Z171 Estrogen receptor negative status [ER-]: Secondary | ICD-10-CM

## 2014-04-07 DIAGNOSIS — C50511 Malignant neoplasm of lower-outer quadrant of right female breast: Secondary | ICD-10-CM

## 2014-04-07 DIAGNOSIS — C50519 Malignant neoplasm of lower-outer quadrant of unspecified female breast: Secondary | ICD-10-CM

## 2014-04-07 NOTE — Progress Notes (Signed)
[place]  [phone] [phone]     ID: Martha Evans DOB: September 16, 1946  MR#: [id number]  ZYY#:[id number]  Patient Care Team: [name], MD as PCP - General (Family Medicine) Teodoro Spray, MD as Consulting Physician (Cardiology) Earnstine Regal, MD as Consulting Physician (General Surgery) Thea Silversmith, MD as Consulting Physician (Radiation Oncology) Chauncey Cruel, MD as Consulting Physician (Oncology)  CHIEF COMPLAINT:   CURRENT TREATMENT:    BREAST CANCER HISTORY: From a Dr. Laurelyn Sickle initial intake note 12/21/2013:  "Martha Evans is a 68 y.o. female. Would medical history significant for hypertension gastroesophageal reflux disease. Patient underwent bilateral breast reductions in the 1980s. In February 2015 she had screening mammograms performed that showed a mass in the central right breast. She had ultrasound performed which revealed a 6 x 4 x 6 mm high-grade calcifications. Biopsy of these calcifications revealed invasive ductal carcinoma. On 12/12/2013 patient underwent a lumpectomy without sentinel lymph node biopsy. 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 Tumor was ER +2% PR +2% HER-2/neu negative with an elevated Ki-67 of 84%. Of note patient [could not] undergo a sentinel lymph node biopsy because of previous breast reduction."  The patient was started on adjuvant chemotherapy with cyclophosphamide and docetaxel 01/16/2014, but treatment was interrupted by a perforated diverticulum requiring partial colectomy 02/13/2014.   Her subsequent history is as detailed below  INTERVAL HISTORY: Martha Evans returns today for followup of her breast cancer. The interval history is significant for treatments having been interrupted because of a perforated diverticulum requiring partial colectomy 02/13/2014. The pathology was benign ([id number]). She is here today to discuss further systemic therapy. She  is establishing herself unless service today.  REVIEW OF SYSTEMS: Martha Evans tolerated her partial colectomy without unusual side effects. She is doing well with her colostomy. She denies unusual pain, fever, rash, or bleeding. She is maintaining her weight. As far as chemotherapy is concerned, she has developed numbness in her feet, which is persistent. She notices this particularly at night. This is of course atypical side effect of Taxotere, and is going to limit our options regarding systemic treatment. A detailed review of systems today was otherwise noncontributory.  PAST MEDICAL HISTORY: Past Medical History  Diagnosis Date  . Hypertension   . Allergy   . Wears hearing aid     right  . GERD (gastroesophageal reflux disease)   . Complication of anesthesia     was hard to intubate with thyroid surgery-99  . Difficult intubation 1999    when had thyroid lobectomy  . Dysrhythmia     irregular reuglar heart beat   . Cancer     breast cancer     PAST SURGICAL HISTORY: Past Surgical History  Procedure Laterality Date  . Thyroid lobectomy  1999    right  . Rotator cuff repair  2006    right shoulder  . Breast reduction surgery Bilateral     1980's  . Abdominal hysterectomy  1999  . Tonsillectomy      as child  . Appendectomy      as child  . Colonoscopy    . Partial mastectomy with needle localization and axillary sentinel lymph node bx Right 12/12/2013    Procedure: PARTIAL MASTECTOMY WITH NEEDLE LOCALIZATION AND AXILLARY SENTINEL LYMPH NODE BX;  Surgeon: Earnstine Regal, MD;  Location: [facility];  Service: General;  Laterality: Right;  . Portacath placement Left 01/02/2014    Procedure:  INSERTION PORT-A-CATH;  Surgeon: Earnstine Regal, MD;  Location: [facility];  Service: General;  Laterality: Left;  . Laparotomy N/A 02/11/2014    Procedure: EXPLORATORY LAPAROTOMY, COLON RESECTION, COLOSTOMY;  Surgeon: Shann Medal, MD;  Location: [facility];  Service: General;  Laterality:  N/A;    FAMILY HISTORY Family History  Problem Relation Age of Onset  . Cancer Father     bladder & pancreatic  The patient's father died from pancreatic cancer the age of 49. The patient's mother is alive, age 64, with significant Alzheimer's disease. The patient had one brother, no sisters. There are no first-degree relatives with breast or ovarian cancer.  GYNECOLOGIC HISTORY:  No LMP recorded. Patient is postmenopausal. Menarche age 61. The patient is GX P0. She underwent hysterectomy with bilateral salpingo-oophorectomy in 1999. She used an estrogen patch for several years after that.   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 Her husband, Lendon Collar, works as a Armed forces logistics/support/administrative officer. He has 3 children from a prior marriage, living in [place], [place], and both work. The patient attends a local [employer]    ADVANCED DIRECTIVES: Not in place   HEALTH MAINTENANCE: History  Substance Use Topics  . Smoking status: Never Smoker   . Smokeless tobacco: Never Used  . Alcohol Use: Yes     Comment: seldom     Colonoscopy:  PAP: Status post hysterectomy  Bone density:  Lipid panel:  Allergies  Allergen Reactions  . Codeine Sulfate Nausea And Vomiting  . Tramadol Nausea Only    Nausea   . Hydrocodone Nausea Only  . Oxycodone Nausea Only    Current Outpatient Prescriptions  Medication Sig Dispense Refill  . calcium carbonate (TUMS - DOSED IN MG ELEMENTAL CALCIUM) 500 MG chewable tablet Chew 1 tablet by mouth daily. As needed      . calcium-vitamin D (OSCAL WITH D) 500-200 MG-UNIT per tablet Take 1 tablet by mouth daily.       Marland Kitchen levofloxacin (LEVAQUIN) 500 MG tablet Take 1 tablet (500 mg total) by mouth every other day.  1 tablet  0  . lidocaine-prilocaine (EMLA) cream Apply topically as needed.  30 g  6  . loratadine (CLARITIN) 10 MG tablet Take 10 mg by mouth daily. lst dose 12/11/13      . omeprazole (PRILOSEC) 20 MG  capsule Take 20 mg by mouth daily.      . polyethylene glycol (MIRALAX / GLYCOLAX) packet Take 17 g by mouth daily as needed for mild constipation.      . sennosides-docusate sodium (SENOKOT-S) 8.6-50 MG tablet Take 1 tablet by mouth daily.      Marland Kitchen telmisartan (MICARDIS) 40 MG tablet Take 40 mg by mouth daily.       No current facility-administered medications for this visit.    OBJECTIVE: Middle-aged white woman who appears stated age  49 Vitals:   04/07/14 1438  BP: 137/71  Pulse: 49  Temp: 98.3 F (36.8 C)  Resp: 18     Body mass index is 30.68 kg/(m^2).    ECOG FS:1 - Symptomatic but completely ambulatory  Ocular: Sclerae unicteric, pupils equal, round and reactive to light Ear-nose-throat: Oropharynx clear, dentition  in good repair  Lymphatic: No cervical or supraclavicular adenopathy Lungs no rales or rhonchi, good excursion bilaterally Heart regular rate and rhythm, no murmur appreciated Abd soft, nontender, positive bowel sounds, colostomy in place, no masses palpated  MSK no  focal spinal tenderness, no joint edema Neuro: non-focal, well-oriented, appropriate affect Breasts: Status post bilateral reduction mammoplasty. The right breast is also status post lumpectomy. The cosmetic result is excellent. There is no evidence of local recurrence. The right axilla is benign. The left breast is unremarkable  LAB RESULTS:  CMP     Component Value Date/Time   NA 142 02/24/2014 0525   NA 140 02/06/2014 1050   K 3.9 02/24/2014 0525   K 3.9 02/06/2014 1050   CL 105 02/24/2014 0525   CO2 26 02/24/2014 0525   CO2 25 02/06/2014 1050   GLUCOSE 95 02/24/2014 0525   GLUCOSE 108 02/06/2014 1050   BUN 13 02/24/2014 0525   BUN 15.3 02/06/2014 1050   CREATININE 2.47* 02/24/2014 0525   CREATININE 0.8 02/06/2014 1050   CALCIUM 9.1 02/24/2014 0525   CALCIUM 9.5 02/06/2014 1050   PROT 5.5* 02/15/2014 0329   PROT 6.9 02/06/2014 1050   ALBUMIN 2.2* 02/15/2014 0329   ALBUMIN 3.1* 02/06/2014 1050   AST  19 02/15/2014 0329   AST 11 02/06/2014 1050   ALT 21 02/15/2014 0329   ALT 15 02/06/2014 1050   ALKPHOS 107 02/15/2014 0329   ALKPHOS 130 02/06/2014 1050   BILITOT 0.6 02/15/2014 0329   BILITOT 0.34 02/06/2014 1050   GFRNONAA 19* 02/24/2014 0525   GFRAA 22* 02/24/2014 0525    I No results found for this basename: SPEP, UPEP,  kappa and lambda light chains    Lab Results  Component Value Date   WBC 14.0* 02/24/2014   NEUTROABS 22.5* 02/19/2014   HGB 7.7* 02/24/2014   HCT 23.1* 02/24/2014   MCV 91.7 02/24/2014   PLT 180 02/24/2014      Chemistry      Component Value Date/Time   NA 142 02/24/2014 0525   NA 140 02/06/2014 1050   K 3.9 02/24/2014 0525   K 3.9 02/06/2014 1050   CL 105 02/24/2014 0525   CO2 26 02/24/2014 0525   CO2 25 02/06/2014 1050   BUN 13 02/24/2014 0525   BUN 15.3 02/06/2014 1050   CREATININE 2.47* 02/24/2014 0525   CREATININE 0.8 02/06/2014 1050      Component Value Date/Time   CALCIUM 9.1 02/24/2014 0525   CALCIUM 9.5 02/06/2014 1050   ALKPHOS 107 02/15/2014 0329   ALKPHOS 130 02/06/2014 1050   AST 19 02/15/2014 0329   AST 11 02/06/2014 1050   ALT 21 02/15/2014 0329   ALT 15 02/06/2014 1050   BILITOT 0.6 02/15/2014 0329   BILITOT 0.34 02/06/2014 1050       No results found for this basename: LABCA2    No components found with this basename: LABCA125    No results found for this basename: INR,  in the last 168 hours  Urinalysis    Component Value Date/Time   COLORURINE YELLOW 02/17/2014 1639   APPEARANCEUR CLOUDY* 02/17/2014 1639   LABSPEC 1.016 02/17/2014 1639   PHURINE 5.0 02/17/2014 [place] 02/17/2014 [place] 02/17/2014 [place] 02/17/2014 [place] 02/17/2014 1639   PROTEINUR 30* 02/17/2014 1639   UROBILINOGEN 1.0 02/17/2014 1639   NITRITE NEGATIVE 02/17/2014 1639   LEUKOCYTESUR NEGATIVE 02/17/2014 1639    STUDIES: No results found.  ASSESSMENT: 68 y.o. [place], [place] woman  (1) status post right  breast biopsy 12/01/2013 for an invasive ductal carcinoma, grade 2 or 3, estrogen receptor 2% "positive", progesterone receptor 2% "positive", both with  moderate staining intensity, with an MIB-1 of 84%, and no HER-2 amplification  (2) status post right lumpectomy 12/12/2013 ([id number]) for a pT1b pNX invasive ductal carcinoma, metaplastic, high-grade, estrogen and progesterone receptor negative, repeat HER-2 again negative. Because of prior reduction mammoplasty sentinel lymph node sampling was not performed  (3) adjuvant chemotherapy with cyclophosphamide and Taxotere started 21/19/4174, complicated by peripheral neuropathy, and interrupted after 2 cycles because of diverticular perforation requiring partial colectomy (02/13/2014)  (4) to complete 2 additional cycles of chemotherapy consisting of carboplatin given day 1 and gemcitabine days 1 and 8 of each 21 day cycle, with Neulasta on day 9.  (5) adjuvant radiation to follow chemotherapy  PLAN: I spent approximately one hour reviewing Martha Evans's situation with her and her husband. She understands she had an unusual type of breast cancer. It was very small about high-grade and fast-growing and essentially estrogen and progesterone receptor negative (the minimal "positivity" in the biopsy is likely artifactual). There is also the complicating factor that she was not able to have an axillary lymph node sampling.  For all those reasons I think adjuvant chemotherapy was a correct decision. I also agree with the original choice of agents. However the patient has developed moderate peripheral neuropathy and we will not be able to continue the Taxotere. I suggested we switch to carboplatin and gemcitabine, with the carboplatin given on day 1 and gemcitabine on days 1 and day 8. We discussed the possible toxicities, side effects and complications of these agents. The plan would be to go through 2 cycles, and then proceed to radiation.  Martha Evans was able to  understand the need for systemic chemotherapy, and the fact that chemotherapy is the only systemic therapy option open to her. She agrees with the overall plan. She knows a goal of treatment in her case is cure. I would like to give her a little bit more time to recover from her recent colectomy, so she will see me again July 24 and we will start her chemotherapy at that point. She knows to call for any problems that may develop before her next visit here.  Chauncey Cruel, MD   04/07/2014 3:07 PM

## 2014-04-07 NOTE — Telephone Encounter (Signed)
Per staff message and POF I have scheduled appts. Advised scheduler of appts. JMW  

## 2014-04-07 NOTE — Telephone Encounter (Signed)
sch pt appt-sent emailt o MW to sch pt trmts-adv pt will call after reply and adv I will mail a copy also

## 2014-04-16 NOTE — Progress Notes (Signed)
Martha Evans [id number] [birth year] 68 y.o. 04/16/2014 1:18 PM  CC  [name], MD [address] [place] [address] Dr. Armandina Gemma Dr. Thea Silversmith  DIAGNOSIS: 68 year old female with new diagnosis of metaplastic right breast cancer (stage I). Patient is seen in medical oncology for discussion of treatment options.  STAGE:   Breast cancer of lower-outer quadrant of right female breast   Primary site: Breast (Right)   Staging method: AJCC 7th Edition   Pathologic: Stage IA (T1, NX, cM0) signed by [name], MD on 12/21/2013  4:49 PM   Summary: Stage IA (T1, NX, cM0)   HISTORY OF PRESENT ILLNESS:  Martha Evans is a 68 y.o. female.  Would medical history significant for hypertension gastroesophageal reflux disease. Patient underwent bilateral breast reductions in the 1980s. In February 2015 she had screening mammograms performed that showed a mass in the central right breast. She had ultrasound performed which revealed a 6 x 4 x 6 mm high-grade calcifications. Biopsy of these calcifications revealed invasive ductal carcinoma. On 12/12/2013 patient underwent a lumpectomy without sentinel lymph node biopsy. 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 Tumor was ER +2% PR +2% HER-2/neu negative with an elevated Ki-67 of 84%. Of note patient undergo a sentinel lymph node biopsy because of previous breast reduction.   INTERVAL HISTORY: Patient is seen in follow up and prior to starting chemotherapy. She will receive Taxotere/cytoxan. She is without any complaints today. Her port site looks good.  Past Medical History: Past Medical History  Diagnosis Date  . Hypertension   . Allergy   . Wears hearing aid     right  . GERD (gastroesophageal reflux disease)   . Complication of anesthesia     was hard to intubate with thyroid surgery-99  . Difficult intubation 1999    when had thyroid lobectomy  . Dysrhythmia      irregular reuglar heart beat   . Cancer     breast cancer     Past Surgical History: Past Surgical History  Procedure Laterality Date  . Thyroid lobectomy  1999    right  . Rotator cuff repair  2006    right shoulder  . Breast reduction surgery Bilateral     1980's  . Abdominal hysterectomy  1999  . Tonsillectomy      as child  . Appendectomy      as child  . Colonoscopy    . Partial mastectomy with needle localization and axillary sentinel lymph node bx Right 12/12/2013    Procedure: PARTIAL MASTECTOMY WITH NEEDLE LOCALIZATION AND AXILLARY SENTINEL LYMPH NODE BX;  Surgeon: Earnstine Regal, MD;  Location: [facility];  Service: General;  Laterality: Right;  . Portacath placement Left 01/02/2014    Procedure: INSERTION PORT-A-CATH;  Surgeon: Earnstine Regal, MD;  Location: [facility];  Service: General;  Laterality: Left;  . Laparotomy N/A 02/11/2014    Procedure: EXPLORATORY LAPAROTOMY, COLON RESECTION, COLOSTOMY;  Surgeon: Shann Medal, MD;  Location: [facility];  Service: General;  Laterality: N/A;    Family History: Family History  Problem Relation Age of Onset  . Cancer Father     bladder & pancreatic    Social History History  Substance Use Topics  . Smoking status: Never Smoker   . Smokeless tobacco: Never Used  . Alcohol Use: Yes     Comment: seldom    Allergies: Allergies  Allergen Reactions  . Codeine Sulfate  Nausea And Vomiting  . Tramadol Nausea Only    Nausea   . Hydrocodone Nausea Only  . Oxycodone Nausea Only    Current Medications: Current Outpatient Prescriptions  Medication Sig Dispense Refill  . calcium carbonate (TUMS - DOSED IN MG ELEMENTAL CALCIUM) 500 MG chewable tablet Chew 1 tablet by mouth daily. As needed      . calcium-vitamin D (OSCAL WITH D) 500-200 MG-UNIT per tablet Take 1 tablet by mouth daily.       Marland Kitchen lidocaine-prilocaine (EMLA) cream Apply topically as needed.  30 g  6  . loratadine (CLARITIN) 10 MG tablet Take 10 mg by  mouth daily. lst dose 12/11/13      . omeprazole (PRILOSEC) 20 MG capsule Take 20 mg by mouth daily.      Marland Kitchen telmisartan (MICARDIS) 40 MG tablet Take 40 mg by mouth daily.      Marland Kitchen levofloxacin (LEVAQUIN) 500 MG tablet Take 1 tablet (500 mg total) by mouth every other day.  1 tablet  0  . polyethylene glycol (MIRALAX / GLYCOLAX) packet Take 17 g by mouth daily as needed for mild constipation.      . sennosides-docusate sodium (SENOKOT-S) 8.6-50 MG tablet Take 1 tablet by mouth daily.       No current facility-administered medications for this visit.    REVIEW OF SYSTEMS:  A comprehensive review of systems was negative.  PHYSICAL EXAMINATION: Blood pressure 146/73, pulse 89, temperature 97.9 F (36.6 C), temperature source Oral, resp. rate 18, height 5' 6"  (1.676 m), weight 188 lb 6.4 oz (85.458 kg).  General:  well-nourished in no acute distress.  Eyes:  no scleral icterus.  ENT:  There were no oropharyngeal lesions.  Neck was without thyromegaly.  Lymphatics:  Negative cervical, supraclavicular or axillary adenopathy.  Respiratory: lungs were clear bilaterally without wheezing or crackles.  Cardiovascular:  Regular rate and rhythm, S1/S2, without murmur, rub or gallop.  There was no pedal edema.  GI:  abdomen was soft, flat, nontender, nondistended, without organomegaly.  Muscoloskeletal:  no spinal tenderness of palpation of vertebral spine.  Skin exam was without echymosis, petichae.  Neuro exam was nonfocal.  Patient was able to get on and off exam table without assistance.  Gait was normal.  Patient was alerted and oriented.  Attention was good.   Language was appropriate.  Mood was normal without depression.  Speech was not pressured.  Thought content was not tangential.   Breasts: right breast normal without mass, skin or nipple changes or axillary nodes with well healing lumpectomy scar, left breast normal without mass, skin or nipple changes or axillary nodes.     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 Tumor was ER +2% PR +2% HER-2/neu negative..  #2 patient will proceed with cycle 1 day of T/C today. She understands the risks and benefits of her treatment.   3. Follow up in 1 week for chemo toxicity assesment.    Thank you so much for allowing me to participate in the care of Blair Dolphin. I will continue to follow up the patient with you and assist in her care.  All questions were answered. The patient knows to call the clinic with any problems, questions or concerns. We can certainly see the patient much sooner if necessary.  Marcy Panning, MD Medical/Oncology [facility] [phone] (beeper) [phone] (Office)

## 2014-04-18 ENCOUNTER — Other Ambulatory Visit: Payer: Self-pay | Admitting: *Deleted

## 2014-04-18 DIAGNOSIS — C50511 Malignant neoplasm of lower-outer quadrant of right female breast: Secondary | ICD-10-CM

## 2014-04-19 ENCOUNTER — Encounter (INDEPENDENT_AMBULATORY_CARE_PROVIDER_SITE_OTHER): Payer: Self-pay | Admitting: Surgery

## 2014-04-19 ENCOUNTER — Ambulatory Visit (INDEPENDENT_AMBULATORY_CARE_PROVIDER_SITE_OTHER): Payer: Medicare Other | Admitting: Surgery

## 2014-04-19 VITALS — BP 128/72 | HR 84 | Temp 98.0°F | Resp 18 | Ht 65.0 in | Wt 178.0 lb

## 2014-04-19 DIAGNOSIS — C50511 Malignant neoplasm of lower-outer quadrant of right female breast: Secondary | ICD-10-CM

## 2014-04-19 DIAGNOSIS — C50519 Malignant neoplasm of lower-outer quadrant of unspecified female breast: Secondary | ICD-10-CM

## 2014-04-19 NOTE — Progress Notes (Signed)
CENTRAL  SURGERY  David Newman, MD,  FACS [address]  [address] [place], [place]    [address] Phone:  [phone] FAX:  [phone]   Re:   Martha Evans DOB:   09/27/1946 MRN:   [id number]  ASSESSMENT AND PLAN: 1.  Exploratory laparotomy with removal of left colon and splenic flexure (mobilization of the splenic flexure), Hartmann pouch, and left transverse colon end colostomy - 02/11/2014 - D. Newman  For perforated diverticular disease.  Hospitalized from 02/10/2014 to 02/24/2014  She will see me back in 8 to 10 weeks.  Plan reversal of colostomy - after chemo, after radiation tx, and at least 6 months from the day of surgery.  1A.  Open wound with VAC  This is healing.  2. Right breast cancer, T1, Nx   Invasive ductal ca (metaplastic), 0.8 cm, Grade II-III, ER 0%, PR 0%, Ki-67 - 84%, HER-2/neu negative.   Has had 2 of 4 cycles of Docetaxel and Cyclophosphamide -   Oncolopgy - Dr.Magrinat (/LIndsey Cornetto.    Right lumpectomy - 12/13/2013 - T. Gerkin   Sees Dr. Wentworth for rad tx.   She is to restart her chemotx next Monday, 04/24/2014, for two cycles.  Then radiation tx.   3. HTN  4. GERD  5. Left subclavian power port - April 2015 - Gerkin  HISTORY OF PRESENT ILLNESS: Chief Complaint  Patient presents with  . Routine Post Op    colostomy    Martha Evans is a 68 y.o. (DOB: 05/28/1946)  white  female who is a patient of FISHER,DONALD EUGENE, MD and comes to me today for follow up of a perforated colon. She comes by herself. She is doing well.  She feels good.  She has a plan going forward.  Past Medical History  Diagnosis Date  . Hypertension   . Allergy   . Wears hearing aid     right  . GERD (gastroesophageal reflux disease)   . Complication of anesthesia     was hard to intubate with thyroid surgery-99  . Difficult intubation 1999    when had thyroid lobectomy  . Dysrhythmia     irregular reuglar heart beat   . Cancer     breast  cancer    SOCIAL HISTORY: Married. Her husband cannot look at her wounds.  PHYSICAL EXAM: BP 128/72  Pulse 84  Temp(Src) 98 F (36.7 C)  Resp 18  Ht 5' 5" (1.651 m)  Wt 178 lb (80.74 kg)  BMI 29.62 kg/m2  General: WN WF who is alert and generally healthy appearing.  She has a little bit of hair coming in. HEENT: Normal. Pupils equal.  Abdomen: Soft. Ostomy in LUQ.  Wound looks good.  It is almost healed.  DATA REVIEWED: Epic notes.  David Newman, MD,  FACS [facility] [address]  [address]   [place], [place]    [address] Phone:  [phone] FAX:  [phone]  

## 2014-04-21 ENCOUNTER — Telehealth: Payer: Self-pay | Admitting: Oncology

## 2014-04-21 ENCOUNTER — Other Ambulatory Visit (HOSPITAL_BASED_OUTPATIENT_CLINIC_OR_DEPARTMENT_OTHER): Payer: Medicare Other

## 2014-04-21 ENCOUNTER — Ambulatory Visit (HOSPITAL_BASED_OUTPATIENT_CLINIC_OR_DEPARTMENT_OTHER): Payer: Medicare Other | Admitting: Oncology

## 2014-04-21 ENCOUNTER — Encounter (INDEPENDENT_AMBULATORY_CARE_PROVIDER_SITE_OTHER): Payer: Self-pay

## 2014-04-21 VITALS — BP 120/66 | HR 79 | Temp 97.7°F | Resp 20 | Ht 65.0 in | Wt 180.9 lb

## 2014-04-21 DIAGNOSIS — G620 Drug-induced polyneuropathy: Secondary | ICD-10-CM

## 2014-04-21 DIAGNOSIS — Z170 Estrogen receptor positive status [ER+]: Secondary | ICD-10-CM

## 2014-04-21 DIAGNOSIS — C50519 Malignant neoplasm of lower-outer quadrant of unspecified female breast: Secondary | ICD-10-CM

## 2014-04-21 DIAGNOSIS — C50511 Malignant neoplasm of lower-outer quadrant of right female breast: Secondary | ICD-10-CM

## 2014-04-21 LAB — COMPREHENSIVE METABOLIC PANEL (CC13)
ALT: 20 U/L (ref 0–55)
ANION GAP: 9 meq/L (ref 3–11)
AST: 23 U/L (ref 5–34)
Albumin: 3.6 g/dL (ref 3.5–5.0)
Alkaline Phosphatase: 101 U/L (ref 40–150)
BUN: 20.7 mg/dL (ref 7.0–26.0)
CO2: 26 meq/L (ref 22–29)
CREATININE: 1 mg/dL (ref 0.6–1.1)
Calcium: 9.7 mg/dL (ref 8.4–10.4)
Chloride: 105 mEq/L (ref 98–109)
GLUCOSE: 97 mg/dL (ref 70–140)
Potassium: 3.9 mEq/L (ref 3.5–5.1)
Sodium: 140 mEq/L (ref 136–145)
Total Bilirubin: 0.38 mg/dL (ref 0.20–1.20)
Total Protein: 7.2 g/dL (ref 6.4–8.3)

## 2014-04-21 LAB — CBC WITH DIFFERENTIAL/PLATELET
BASO%: 0.8 % (ref 0.0–2.0)
Basophils Absolute: 0 10*3/uL (ref 0.0–0.1)
EOS ABS: 0.2 10*3/uL (ref 0.0–0.5)
EOS%: 3.5 % (ref 0.0–7.0)
HEMATOCRIT: 33 % — AB (ref 34.8–46.6)
HEMOGLOBIN: 10.6 g/dL — AB (ref 11.6–15.9)
LYMPH#: 1.2 10*3/uL (ref 0.9–3.3)
LYMPH%: 18.3 % (ref 14.0–49.7)
MCH: 28.7 pg (ref 25.1–34.0)
MCHC: 32.2 g/dL (ref 31.5–36.0)
MCV: 89.2 fL (ref 79.5–101.0)
MONO#: 0.5 10*3/uL (ref 0.1–0.9)
MONO%: 8.3 % (ref 0.0–14.0)
NEUT%: 69.1 % (ref 38.4–76.8)
NEUTROS ABS: 4.4 10*3/uL (ref 1.5–6.5)
PLATELETS: 275 10*3/uL (ref 145–400)
RBC: 3.7 10*6/uL (ref 3.70–5.45)
RDW: 14.3 % (ref 11.2–14.5)
WBC: 6.3 10*3/uL (ref 3.9–10.3)

## 2014-04-21 MED ORDER — ONDANSETRON HCL 8 MG PO TABS: 8.0000 mg | ORAL_TABLET | Freq: Two times a day (BID) | ORAL | Status: DC

## 2014-04-21 MED ORDER — DEXAMETHASONE 4 MG PO TABS: 8.0000 mg | ORAL_TABLET | Freq: Two times a day (BID) | ORAL | Status: DC

## 2014-04-21 NOTE — Progress Notes (Signed)
[facility]  [phone] [phone]     ID: Martha Evans DOB: 1946-05-27  MR#: [id number]  YSH#:[id number]  Patient Care Team: [name], MD as PCP - General (Family Medicine) Teodoro Spray, MD as Consulting Physician (Cardiology) Earnstine Regal, MD as Consulting Physician (General Surgery) Thea Silversmith, MD as Consulting Physician (Radiation Oncology) Chauncey Cruel, MD as Consulting Physician (Oncology) Armstead Peaks, MD as Consulting Physician  CHIEF COMPLAINT: Metaplastic breast cancer  CURRENT TREATMENT: Awaiting radiation   BREAST CANCER HISTORY: From a Dr. Laurelyn Sickle initial intake note 12/21/2013:  "Martha Evans is a 68 y.o. female. Would medical history significant for hypertension gastroesophageal reflux disease. Patient underwent bilateral breast reductions in the 1980s. In February 2015 she had screening mammograms performed that showed a mass in the central right breast. She had ultrasound performed which revealed a 6 x 4 x 6 mm high-grade calcifications. Biopsy of these calcifications revealed invasive ductal carcinoma. On 12/12/2013 patient underwent a lumpectomy without sentinel lymph node biopsy. 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 Tumor was ER +2% PR +2% HER-2/neu negative with an elevated Ki-67 of 84%. Of note patient [could not] undergo a sentinel lymph node biopsy because of previous breast reduction."  The patient was started on adjuvant chemotherapy with cyclophosphamide and docetaxel 01/16/2014, but treatment was interrupted by a perforated diverticulum requiring partial colectomy 02/13/2014.   Her subsequent history is as detailed below  INTERVAL HISTORY: Martha Evans returns today for followup of her breast cancer. To review: Her chemotherapy was interrupted because of a diverticular perforation. She had also begun to develop significant peripheral neuropathy. She is  scheduled to start carboplatin and gemcitabine 04/24/2014, with the carboplatin given on day 1, and the gemcitabine given on days 1 and 8 of each 21 day cycle, with Neulasta on day 9.  REVIEW OF SYSTEMS: Martha Evans has been packing the abdominal wound daily and it is "almost healed". She tells me Dr. Lucia Gaskins her surgeon has told her it is okay for her to resume water aerobics in 2 weeks. The patient is managing her colostomy well. She complains of some hearing loss, occasional palpitations, and some continuing neuropathy, which is clearly grade 1. A detailed review of systems today was otherwise noncontributory  PAST MEDICAL HISTORY: Past Medical History  Diagnosis Date  . Hypertension   . Allergy   . Wears hearing aid     right  . GERD (gastroesophageal reflux disease)   . Complication of anesthesia     was hard to intubate with thyroid surgery-99  . Difficult intubation 1999    when had thyroid lobectomy  . Dysrhythmia     irregular reuglar heart beat   . Cancer     breast cancer     PAST SURGICAL HISTORY: Past Surgical History  Procedure Laterality Date  . Thyroid lobectomy  1999    right  . Rotator cuff repair  2006    right shoulder  . Breast reduction surgery Bilateral     1980's  . Abdominal hysterectomy  1999  . Tonsillectomy      as child  . Appendectomy      as child  . Colonoscopy    . Partial mastectomy with needle localization and axillary sentinel lymph node bx Right 12/12/2013    Procedure: PARTIAL MASTECTOMY WITH NEEDLE LOCALIZATION AND AXILLARY SENTINEL LYMPH NODE BX;  Surgeon: Earnstine Regal, MD;  Location: [facility];  Service: General;  Laterality: Right;  . Portacath placement Left 01/02/2014    Procedure: INSERTION PORT-A-CATH;  Surgeon: Earnstine Regal, MD;  Location: [facility];  Service: General;  Laterality: Left;  . Laparotomy N/A 02/11/2014    Procedure: EXPLORATORY LAPAROTOMY, COLON RESECTION, COLOSTOMY;  Surgeon: Shann Medal, MD;  Location:  [facility];  Service: General;  Laterality: N/A;    FAMILY HISTORY Family History  Problem Relation Age of Onset  . Cancer Father     bladder & pancreatic  The patient's father died from pancreatic cancer the age of 59. The patient's mother is alive, age 60, with significant Alzheimer's disease. The patient had one brother, no sisters. There are no first-degree relatives with breast or ovarian cancer.  GYNECOLOGIC HISTORY:  No LMP recorded. Patient is postmenopausal. Menarche age 65. The patient is GX P0. She underwent hysterectomy with bilateral salpingo-oophorectomy in 1999. She used an estrogen patch for several years after that.   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 Her husband, Lendon Collar, works as a Armed forces logistics/support/administrative officer. He has 3 children from a prior marriage, living in [place], [place], and both work. The patient attends a local [employer]    ADVANCED DIRECTIVES: Not in place   HEALTH MAINTENANCE: History  Substance Use Topics  . Smoking status: Never Smoker   . Smokeless tobacco: Never Used  . Alcohol Use: Yes     Comment: seldom     Colonoscopy:  PAP: Status post hysterectomy  Bone density:  Lipid panel:  Allergies  Allergen Reactions  . Codeine Sulfate Nausea And Vomiting  . Tramadol Nausea Only    Nausea   . Hydrocodone Nausea Only  . Oxycodone Nausea Only    Current Outpatient Prescriptions  Medication Sig Dispense Refill  . b complex vitamins tablet Take 1 tablet by mouth daily.      . calcium carbonate (TUMS - DOSED IN MG ELEMENTAL CALCIUM) 500 MG chewable tablet Chew 1 tablet by mouth daily. As needed      . calcium-vitamin D (OSCAL WITH D) 500-200 MG-UNIT per tablet Take 1 tablet by mouth daily.       Marland Kitchen dexamethasone (DECADRON) 4 MG tablet Take 2 tablets (8 mg total) by mouth 2 (two) times daily with a meal. Start the day after chemotherapy for 3 days.  30 tablet  1  . levofloxacin (LEVAQUIN)  500 MG tablet Take 1 tablet (500 mg total) by mouth every other day.  1 tablet  0  . lidocaine-prilocaine (EMLA) cream Apply topically as needed.  30 g  6  . loratadine (CLARITIN) 10 MG tablet Take 10 mg by mouth daily. lst dose 12/11/13      . omeprazole (PRILOSEC) 20 MG capsule Take 20 mg by mouth daily.      . ondansetron (ZOFRAN) 8 MG tablet Take 1 tablet (8 mg total) by mouth 2 (two) times daily. Start the day after chemo for 3 days. Then take as needed for nausea or vomiting.  30 tablet  1  . polyethylene glycol (MIRALAX / GLYCOLAX) packet Take 17 g by mouth daily as needed for mild constipation.      . sennosides-docusate sodium (SENOKOT-S) 8.6-50 MG tablet Take 1 tablet by mouth daily.      Marland Kitchen telmisartan (MICARDIS) 40 MG tablet Take 40 mg by mouth daily.       No current facility-administered medications for this visit.    OBJECTIVE: Middle-aged white woman  in no acute distress Filed Vitals:   04/21/14 1311  BP: 120/66  Pulse: 79  Temp: 97.7 F (36.5 C)  Resp: 20     Body mass index is 30.1 kg/(m^2).    ECOG FS:1 - Symptomatic but completely ambulatory  Ocular: Sclerae unicteric, EOMs intact Ear-nose-throat: Oropharynx clear and moist Lymphatic: No cervical or supraclavicular adenopathy Lungs no rales or rhonchi Heart regular rate and rhythm Abd soft, nontender, positive bowel sounds, colostomy in place, small amount of normal appearing stooling in a bag; midabdominal wound is healing nicely, with a pink base MSK no focal spinal tenderness, no upper extremity lymphedema Neuro: non-focal, well-oriented, positive affect Breasts: Status post bilateral reduction mammoplasties. The right breast is also status post lumpectomy. . There is no evidence of local recurrence. The right axilla is benign. The left breast is unremarkable  LAB RESULTS:  CMP     Component Value Date/Time   NA 140 04/21/2014 1155   NA 142 02/24/2014 0525   K 3.9 04/21/2014 1155   K 3.9 02/24/2014 0525   CL  105 02/24/2014 0525   CO2 26 04/21/2014 1155   CO2 26 02/24/2014 0525   GLUCOSE 97 04/21/2014 1155   GLUCOSE 95 02/24/2014 0525   BUN 20.7 04/21/2014 1155   BUN 13 02/24/2014 0525   CREATININE 1.0 04/21/2014 1155   CREATININE 2.47* 02/24/2014 0525   CALCIUM 9.7 04/21/2014 1155   CALCIUM 9.1 02/24/2014 0525   PROT 7.2 04/21/2014 1155   PROT 5.5* 02/15/2014 0329   ALBUMIN 3.6 04/21/2014 1155   ALBUMIN 2.2* 02/15/2014 0329   AST 23 04/21/2014 1155   AST 19 02/15/2014 0329   ALT 20 04/21/2014 1155   ALT 21 02/15/2014 0329   ALKPHOS 101 04/21/2014 1155   ALKPHOS 107 02/15/2014 0329   BILITOT 0.38 04/21/2014 1155   BILITOT 0.6 02/15/2014 0329   GFRNONAA 19* 02/24/2014 0525   GFRAA 22* 02/24/2014 0525    I No results found for this basename: SPEP,  UPEP,   kappa and lambda light chains    Lab Results  Component Value Date   WBC 6.3 04/21/2014   NEUTROABS 4.4 04/21/2014   HGB 10.6* 04/21/2014   HCT 33.0* 04/21/2014   MCV 89.2 04/21/2014   PLT 275 04/21/2014      Chemistry      Component Value Date/Time   NA 140 04/21/2014 1155   NA 142 02/24/2014 0525   K 3.9 04/21/2014 1155   K 3.9 02/24/2014 0525   CL 105 02/24/2014 0525   CO2 26 04/21/2014 1155   CO2 26 02/24/2014 0525   BUN 20.7 04/21/2014 1155   BUN 13 02/24/2014 0525   CREATININE 1.0 04/21/2014 1155   CREATININE 2.47* 02/24/2014 0525      Component Value Date/Time   CALCIUM 9.7 04/21/2014 1155   CALCIUM 9.1 02/24/2014 0525   ALKPHOS 101 04/21/2014 1155   ALKPHOS 107 02/15/2014 0329   AST 23 04/21/2014 1155   AST 19 02/15/2014 0329   ALT 20 04/21/2014 1155   ALT 21 02/15/2014 0329   BILITOT 0.38 04/21/2014 1155   BILITOT 0.6 02/15/2014 0329       No results found for this basename: LABCA2    No components found with this basename: LABCA125    No results found for this basename: INR,  in the last 168 hours  Urinalysis    Component Value Date/Time   COLORURINE YELLOW 02/17/2014 1639   APPEARANCEUR CLOUDY* 02/17/2014 1639  LABSPEC 1.016  02/17/2014 1639   PHURINE 5.0 02/17/2014 1639   GLUCOSEU NEGATIVE 02/17/2014 [place] 02/17/2014 [place] 02/17/2014 [place] 02/17/2014 1639   PROTEINUR 30* 02/17/2014 1639   UROBILINOGEN 1.0 02/17/2014 1639   NITRITE NEGATIVE 02/17/2014 [place] 02/17/2014 1639    STUDIES: No results found.  ASSESSMENT: 68 y.o. [place], [place] woman  (1) status post right breast biopsy 12/01/2013 for an invasive ductal carcinoma, grade 2 or 3, estrogen receptor 2% "positive", progesterone receptor 2% "positive", both with moderate staining intensity, with an MIB-1 of 84%, and no HER-2 amplification  (2) status post right lumpectomy 12/12/2013 ([id number]) for a pT1b pNX invasive ductal carcinoma, metaplastic, high-grade, estrogen and progesterone receptor negative, repeat HER-2 again negative. Because of prior reduction mammoplasty sentinel lymph node sampling was not performed  (3) adjuvant chemotherapy with cyclophosphamide and docetaxel started 78/67/5449, complicated by peripheral neuropathy, and interrupted after 2 cycles because of diverticular perforation requiring partial colectomy (02/13/2014)  (4) to complete 2 additional cycles of chemotherapy starting 04/24/2014, consisting of carboplatin given day 1 and gemcitabine days 1 and 8 of each 21 day cycle, with Neulasta on day 9.  (5) adjuvant radiation to follow chemotherapy; patient intends to receive this in [place]: Briann has recovered well from her diverticular surgery and is now ready to complete her systemic chemotherapy. Today we reviewed the drugs and overall plan and she has a good understanding of the possible toxicities, side effects and complications of treatment. We'll so reviewed the supportive medications, and she really has dexamethasone and ondansetron at hand, which she will take twice daily beginning on day 2 and continuing for 2 days. She has prochlorperazine  which she will take every 6 hours as needed. She also has lorazepam which she may take at bedtime if she finds insomnia becomes a problem with the steroids.  Jayle has a good understanding of the overall plan. She agrees with that. She knows the goal of treatment in her case is cure. She will call with any problems that may develop before her next visit here.  Chauncey Cruel, MD   04/22/2014 4:32 PM

## 2014-04-21 NOTE — Telephone Encounter (Signed)
per pof to sch pt appt-MW sch trmts-gave pt copy of sch

## 2014-04-24 ENCOUNTER — Ambulatory Visit (HOSPITAL_BASED_OUTPATIENT_CLINIC_OR_DEPARTMENT_OTHER): Payer: Medicare Other

## 2014-04-24 VITALS — BP 130/65 | HR 85 | Temp 98.3°F | Resp 18

## 2014-04-24 DIAGNOSIS — C50511 Malignant neoplasm of lower-outer quadrant of right female breast: Secondary | ICD-10-CM

## 2014-04-24 DIAGNOSIS — Z5111 Encounter for antineoplastic chemotherapy: Secondary | ICD-10-CM

## 2014-04-24 DIAGNOSIS — C50519 Malignant neoplasm of lower-outer quadrant of unspecified female breast: Secondary | ICD-10-CM

## 2014-04-24 MED ORDER — SODIUM CHLORIDE 0.9 % IV SOLN
Freq: Once | INTRAVENOUS | Status: AC
  Administered 2014-04-24: 14:00:00 via INTRAVENOUS

## 2014-04-24 MED ORDER — SODIUM CHLORIDE 0.9 % IJ SOLN
10.0000 mL | INTRAMUSCULAR | Status: DC | PRN
  Administered 2014-04-24: 10 mL
  Filled 2014-04-24: qty 10

## 2014-04-24 MED ORDER — HEPARIN SOD (PORK) LOCK FLUSH 100 UNIT/ML IV SOLN
500.0000 [IU] | Freq: Once | INTRAVENOUS | Status: AC | PRN
  Administered 2014-04-24: 500 [IU]
  Filled 2014-04-24: qty 5

## 2014-04-24 MED ORDER — ONDANSETRON 16 MG/50ML IVPB (CHCC)
16.0000 mg | Freq: Once | INTRAVENOUS | Status: AC
  Administered 2014-04-24: 16 mg via INTRAVENOUS

## 2014-04-24 MED ORDER — DEXAMETHASONE SODIUM PHOSPHATE 20 MG/5ML IJ SOLN
20.0000 mg | Freq: Once | INTRAMUSCULAR | Status: AC
  Administered 2014-04-24: 20 mg via INTRAVENOUS

## 2014-04-24 MED ORDER — DEXAMETHASONE SODIUM PHOSPHATE 20 MG/5ML IJ SOLN
INTRAMUSCULAR | Status: AC
  Filled 2014-04-24: qty 5

## 2014-04-24 MED ORDER — SODIUM CHLORIDE 0.9 % IV SOLN
479.0000 mg | Freq: Once | INTRAVENOUS | Status: AC
  Administered 2014-04-24: 480 mg via INTRAVENOUS
  Filled 2014-04-24: qty 48

## 2014-04-24 MED ORDER — ONDANSETRON 16 MG/50ML IVPB (CHCC)
INTRAVENOUS | Status: AC
  Filled 2014-04-24: qty 16

## 2014-04-24 MED ORDER — SODIUM CHLORIDE 0.9 % IV SOLN
800.0000 mg/m2 | Freq: Once | INTRAVENOUS | Status: AC
  Administered 2014-04-24: 1520 mg via INTRAVENOUS
  Filled 2014-04-24: qty 39.98

## 2014-04-24 NOTE — Patient Instructions (Signed)
[facility] Discharge Instructions for Patients Receiving Chemotherapy  Today you received the following chemotherapy agents Gemzar/Carboplatin.  To help prevent nausea and vomiting after your treatment, we encourage you to take your nausea medication as prescribed.   If you develop nausea and vomiting that is not controlled by your nausea medication, call the clinic.   BELOW ARE SYMPTOMS THAT SHOULD BE REPORTED IMMEDIATELY:  *FEVER GREATER THAN 100.5 F  *CHILLS WITH OR WITHOUT FEVER  NAUSEA AND VOMITING THAT IS NOT CONTROLLED WITH YOUR NAUSEA MEDICATION  *UNUSUAL SHORTNESS OF BREATH  *UNUSUAL BRUISING OR BLEEDING  TENDERNESS IN MOUTH AND THROAT WITH OR WITHOUT PRESENCE OF ULCERS  *URINARY PROBLEMS  *BOWEL PROBLEMS  UNUSUAL RASH Items with * indicate a potential emergency and should be followed up as soon as possible.  Feel free to call the clinic you have any questions or concerns. The clinic phone number is [phone].    

## 2014-04-24 NOTE — Addendum Note (Signed)
Addended by: Amelia Jo I on: 04/24/2014 01:36 PM   Modules accepted: Orders, Medications

## 2014-04-25 ENCOUNTER — Telehealth: Payer: Self-pay | Admitting: *Deleted

## 2014-04-25 NOTE — Telephone Encounter (Signed)
Called Martha Evans at [phone] number(s).  Message left requesting a return call for chemotherapy follow up.  Awaiting return call from patient.

## 2014-04-25 NOTE — Telephone Encounter (Signed)
Message copied by Cherylynn Ridges on Tue Apr 25, 2014  1:21 PM ------      Message from: Renford Dills      Created: Mon Apr 24, 2014  2:48 PM      Regarding: chemo follow-up call       1st Gemzar/Carbo  Dr. Jana Hakim [phone] ------

## 2014-04-27 ENCOUNTER — Other Ambulatory Visit: Payer: Self-pay | Admitting: *Deleted

## 2014-04-27 DIAGNOSIS — C50511 Malignant neoplasm of lower-outer quadrant of right female breast: Secondary | ICD-10-CM

## 2014-04-28 ENCOUNTER — Telehealth: Payer: Self-pay | Admitting: Oncology

## 2014-04-28 ENCOUNTER — Other Ambulatory Visit (HOSPITAL_BASED_OUTPATIENT_CLINIC_OR_DEPARTMENT_OTHER): Payer: Medicare Other

## 2014-04-28 ENCOUNTER — Ambulatory Visit (HOSPITAL_BASED_OUTPATIENT_CLINIC_OR_DEPARTMENT_OTHER): Payer: Medicare Other | Admitting: Oncology

## 2014-04-28 VITALS — BP 131/63 | HR 63 | Temp 97.8°F | Resp 18 | Ht 65.0 in | Wt 180.3 lb

## 2014-04-28 DIAGNOSIS — C50511 Malignant neoplasm of lower-outer quadrant of right female breast: Secondary | ICD-10-CM

## 2014-04-28 DIAGNOSIS — C50519 Malignant neoplasm of lower-outer quadrant of unspecified female breast: Secondary | ICD-10-CM

## 2014-04-28 DIAGNOSIS — K5732 Diverticulitis of large intestine without perforation or abscess without bleeding: Secondary | ICD-10-CM

## 2014-04-28 LAB — COMPREHENSIVE METABOLIC PANEL (CC13)
ALT: 37 U/L (ref 0–55)
ANION GAP: 10 meq/L (ref 3–11)
AST: 23 U/L (ref 5–34)
Albumin: 3.6 g/dL (ref 3.5–5.0)
Alkaline Phosphatase: 75 U/L (ref 40–150)
BUN: 32.6 mg/dL — AB (ref 7.0–26.0)
CALCIUM: 9.7 mg/dL (ref 8.4–10.4)
CHLORIDE: 103 meq/L (ref 98–109)
CO2: 25 meq/L (ref 22–29)
CREATININE: 1.1 mg/dL (ref 0.6–1.1)
GLUCOSE: 89 mg/dL (ref 70–140)
Potassium: 3.7 mEq/L (ref 3.5–5.1)
Sodium: 138 mEq/L (ref 136–145)
Total Bilirubin: 0.73 mg/dL (ref 0.20–1.20)
Total Protein: 7.2 g/dL (ref 6.4–8.3)

## 2014-04-28 LAB — CBC WITH DIFFERENTIAL/PLATELET
BASO%: 0.4 % (ref 0.0–2.0)
Basophils Absolute: 0 10*3/uL (ref 0.0–0.1)
EOS ABS: 0 10*3/uL (ref 0.0–0.5)
EOS%: 0 % (ref 0.0–7.0)
HEMATOCRIT: 31.1 % — AB (ref 34.8–46.6)
HEMOGLOBIN: 10.1 g/dL — AB (ref 11.6–15.9)
LYMPH#: 0.9 10*3/uL (ref 0.9–3.3)
LYMPH%: 10.9 % — ABNORMAL LOW (ref 14.0–49.7)
MCH: 28.8 pg (ref 25.1–34.0)
MCHC: 32.6 g/dL (ref 31.5–36.0)
MCV: 88.4 fL (ref 79.5–101.0)
MONO#: 0 10*3/uL — AB (ref 0.1–0.9)
MONO%: 0.4 % (ref 0.0–14.0)
NEUT#: 7.6 10*3/uL — ABNORMAL HIGH (ref 1.5–6.5)
NEUT%: 88.3 % — AB (ref 38.4–76.8)
Platelets: 275 10*3/uL (ref 145–400)
RBC: 3.52 10*6/uL — ABNORMAL LOW (ref 3.70–5.45)
RDW: 14.2 % (ref 11.2–14.5)
WBC: 8.6 10*3/uL (ref 3.9–10.3)

## 2014-04-28 NOTE — Progress Notes (Signed)
[facility]  [phone] [phone]     ID: Martha Evans DOB: 1946-05-26  MR#: [id number]  MGN#:[id number]  Patient Care Team: [name], MD as PCP - General (Family Medicine) Teodoro Spray, MD as Consulting Physician (Cardiology) Earnstine Regal, MD as Consulting Physician (General Surgery) Thea Silversmith, MD as Consulting Physician (Radiation Oncology) Chauncey Cruel, MD as Consulting Physician (Oncology) Armstead Peaks, MD as Consulting Physician  CHIEF COMPLAINT: Metaplastic breast cancer  CURRENT TREATMENT:  adjuvant chemotherapy   BREAST CANCER HISTORY: From a Dr. Laurelyn Sickle initial intake note 12/21/2013:  "Martha Evans is a 68 y.o. female. Would medical history significant for hypertension gastroesophageal reflux disease. Patient underwent bilateral breast reductions in the 1980s. In February 2015 she had screening mammograms performed that showed a mass in the central right breast. She had ultrasound performed which revealed a 6 x 4 x 6 mm high-grade calcifications. Biopsy of these calcifications revealed invasive ductal carcinoma. On 12/12/2013 patient underwent a lumpectomy without sentinel lymph node biopsy. 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 Tumor was ER +2% PR +2% HER-2/neu negative with an elevated Ki-67 of 84%. Of note patient [could not] undergo a sentinel lymph node biopsy because of previous breast reduction."  The patient was started on adjuvant chemotherapy with cyclophosphamide and docetaxel 01/16/2014, but treatment was interrupted by a perforated diverticulum requiring partial colectomy 02/13/2014.   Her subsequent history is as detailed below  INTERVAL HISTORY: Martha Evans returns today for followup of her breast cancer. Today is day 5 cycle 1 of her to planned carboplatin and gemcitabine cycles, with the carboplatin given on day 1, and the gemcitabine given on days 1 and 8 of  each 21 day cycle, with Neulasta on day 9.  REVIEW OF SYSTEMS: Martha Evans did absolutely terrific with the chemotherapy. She has good energy, no nausea, and no rash or other reactions to the treatment she felt a little bit shaky for a day or 2, and had some trouble going to sleep. Her colostomy is working well with the help of a little bit of Senokot and keeping herself well hydrated. Sometimes at night she develops a little bit of cough and she has a little hoarseness in the morning, likely secondary to reflux. Otherwise detailed review of systems today was noncontributory  PAST MEDICAL HISTORY: Past Medical History  Diagnosis Date  . Hypertension   . Allergy   . Wears hearing aid     right  . GERD (gastroesophageal reflux disease)   . Complication of anesthesia     was hard to intubate with thyroid surgery-99  . Difficult intubation 1999    when had thyroid lobectomy  . Dysrhythmia     irregular reuglar heart beat   . Cancer     breast cancer     PAST SURGICAL HISTORY: Past Surgical History  Procedure Laterality Date  . Thyroid lobectomy  1999    right  . Rotator cuff repair  2006    right shoulder  . Breast reduction surgery Bilateral     1980's  . Abdominal hysterectomy  1999  . Tonsillectomy      as child  . Appendectomy      as child  . Colonoscopy    . Partial mastectomy with needle localization and axillary sentinel lymph node bx Right 12/12/2013    Procedure: PARTIAL MASTECTOMY WITH NEEDLE LOCALIZATION AND AXILLARY SENTINEL LYMPH NODE BX;  Surgeon: Earnstine Regal, MD;  Location: [facility];  Service: General;  Laterality: Right;  . Portacath placement Left 01/02/2014    Procedure: INSERTION PORT-A-CATH;  Surgeon: Earnstine Regal, MD;  Location: [facility];  Service: General;  Laterality: Left;  . Laparotomy N/A 02/11/2014    Procedure: EXPLORATORY LAPAROTOMY, COLON RESECTION, COLOSTOMY;  Surgeon: Shann Medal, MD;  Location: [facility];  Service: General;   Laterality: N/A;    FAMILY HISTORY Family History  Problem Relation Age of Onset  . Cancer Father     bladder & pancreatic  The patient's father died from pancreatic cancer the age of 39. The patient's mother is alive, age 63, with significant Alzheimer's disease. The patient had one brother, no sisters. There are no first-degree relatives with breast or ovarian cancer.  GYNECOLOGIC HISTORY:  No LMP recorded. Patient is postmenopausal. Menarche age 30. The patient is GX P0. She underwent hysterectomy with bilateral salpingo-oophorectomy in 1999. She used an estrogen patch for several years after that.   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 Her husband, Lendon Collar, works as a Armed forces logistics/support/administrative officer. He has 3 children from a prior marriage, living in [place], [place], and both work. The patient attends a local [employer]    ADVANCED DIRECTIVES: Not in place   HEALTH MAINTENANCE: History  Substance Use Topics  . Smoking status: Never Smoker   . Smokeless tobacco: Never Used  . Alcohol Use: Yes     Comment: seldom     Colonoscopy:  PAP: Status post hysterectomy  Bone density:  Lipid panel:  Allergies  Allergen Reactions  . Codeine Sulfate Nausea And Vomiting  . Tramadol Nausea Only    Nausea   . Hydrocodone Nausea Only  . Oxycodone Nausea Only    Current Outpatient Prescriptions  Medication Sig Dispense Refill  . b complex vitamins tablet Take 1 tablet by mouth daily.      . calcium carbonate (TUMS - DOSED IN MG ELEMENTAL CALCIUM) 500 MG chewable tablet Chew 1 tablet by mouth daily. As needed      . calcium-vitamin D (OSCAL WITH D) 500-200 MG-UNIT per tablet Take 1 tablet by mouth daily.       Marland Kitchen dexamethasone (DECADRON) 4 MG tablet Take 2 tablets (8 mg total) by mouth 2 (two) times daily with a meal. Start the day after chemotherapy for 3 days.  30 tablet  1  . lidocaine-prilocaine (EMLA) cream Apply topically as  needed.  30 g  6  . loratadine (CLARITIN) 10 MG tablet Take 10 mg by mouth daily. lst dose 12/11/13      . omeprazole (PRILOSEC) 20 MG capsule Take 20 mg by mouth daily.      . ondansetron (ZOFRAN) 8 MG tablet Take 1 tablet (8 mg total) by mouth 2 (two) times daily. Start the day after chemo for 3 days. Then take as needed for nausea or vomiting.  30 tablet  1  . polyethylene glycol (MIRALAX / GLYCOLAX) packet Take 17 g by mouth daily as needed for mild constipation.      . sennosides-docusate sodium (SENOKOT-S) 8.6-50 MG tablet Take 1 tablet by mouth daily.      Marland Kitchen telmisartan (MICARDIS) 40 MG tablet Take 40 mg by mouth daily.       No current facility-administered medications for this visit.    OBJECTIVE: Middle-aged white woman who appears stated age 68 Vitals:   04/28/14 1303  BP: 131/63  Pulse: 63  Temp: 97.8 F (36.6 C)  Resp: 18     Body mass index is 30 kg/(m^2).    ECOG FS:1 - Symptomatic but completely ambulatory  Ocular: Sclerae unicteric, pupils round and equal Ear-nose-throat: Oropharynx clear, no thrush or other lesions Lymphatic: No cervical or supraclavicular adenopathy Lungs no rales or rhonchi Heart regular rate and rhythm Abd soft, nontender, positive bowel sounds, colostomy in place with normal stool in bag MSK no focal spinal tenderness, no upper extremity lymphedema Neuro: non-focal, well-oriented, positive affect Breasts: Deferred   LAB RESULTS:  CMP     Component Value Date/Time   NA 138 04/28/2014 1207   NA 142 02/24/2014 0525   K 3.7 04/28/2014 1207   K 3.9 02/24/2014 0525   CL 105 02/24/2014 0525   CO2 25 04/28/2014 1207   CO2 26 02/24/2014 0525   GLUCOSE 89 04/28/2014 1207   GLUCOSE 95 02/24/2014 0525   BUN 32.6* 04/28/2014 1207   BUN 13 02/24/2014 0525   CREATININE 1.1 04/28/2014 1207   CREATININE 2.47* 02/24/2014 0525   CALCIUM 9.7 04/28/2014 1207   CALCIUM 9.1 02/24/2014 0525   PROT 7.2 04/28/2014 1207   PROT 5.5* 02/15/2014 0329   ALBUMIN 3.6  04/28/2014 1207   ALBUMIN 2.2* 02/15/2014 0329   AST 23 04/28/2014 1207   AST 19 02/15/2014 0329   ALT 37 04/28/2014 1207   ALT 21 02/15/2014 0329   ALKPHOS 75 04/28/2014 1207   ALKPHOS 107 02/15/2014 0329   BILITOT 0.73 04/28/2014 1207   BILITOT 0.6 02/15/2014 0329   GFRNONAA 19* 02/24/2014 0525   GFRAA 22* 02/24/2014 0525    I No results found for this basename: SPEP,  UPEP,   kappa and lambda light chains    Lab Results  Component Value Date   WBC 8.6 04/28/2014   NEUTROABS 7.6* 04/28/2014   HGB 10.1* 04/28/2014   HCT 31.1* 04/28/2014   MCV 88.4 04/28/2014   PLT 275 04/28/2014      Chemistry      Component Value Date/Time   NA 138 04/28/2014 1207   NA 142 02/24/2014 0525   K 3.7 04/28/2014 1207   K 3.9 02/24/2014 0525   CL 105 02/24/2014 0525   CO2 25 04/28/2014 1207   CO2 26 02/24/2014 0525   BUN 32.6* 04/28/2014 1207   BUN 13 02/24/2014 0525   CREATININE 1.1 04/28/2014 1207   CREATININE 2.47* 02/24/2014 0525      Component Value Date/Time   CALCIUM 9.7 04/28/2014 1207   CALCIUM 9.1 02/24/2014 0525   ALKPHOS 75 04/28/2014 1207   ALKPHOS 107 02/15/2014 0329   AST 23 04/28/2014 1207   AST 19 02/15/2014 0329   ALT 37 04/28/2014 1207   ALT 21 02/15/2014 0329   BILITOT 0.73 04/28/2014 1207   BILITOT 0.6 02/15/2014 0329       No results found for this basename: LABCA2    No components found with this basename: LABCA125    No results found for this basename: INR,  in the last 168 hours  Urinalysis    Component Value Date/Time   COLORURINE YELLOW 02/17/2014 1639   APPEARANCEUR CLOUDY* 02/17/2014 1639   LABSPEC 1.016 02/17/2014 1639   PHURINE 5.0 02/17/2014 [place] 02/17/2014 [place] 02/17/2014 [place] 02/17/2014 [place] 02/17/2014 1639   PROTEINUR 30* 02/17/2014 1639   UROBILINOGEN 1.0 02/17/2014 1639   NITRITE NEGATIVE 02/17/2014  [place] 02/17/2014 1639    STUDIES: No results found.  ASSESSMENT:  68 y.o. [place], [place] woman  (1) status post right breast biopsy 12/01/2013 for an invasive ductal carcinoma, grade 2 or 3, estrogen receptor 2% "positive", progesterone receptor 2% "positive", both with moderate staining intensity, with an MIB-1 of 84%, and no HER-2 amplification  (2) status post right lumpectomy 12/12/2013 ([id number]) for a pT1b pNX invasive ductal carcinoma, metaplastic, high-grade, estrogen and progesterone receptor negative, repeat HER-2 again negative. Because of prior reduction mammoplasty sentinel lymph node sampling was not performed  (3) adjuvant chemotherapy with cyclophosphamide and docetaxel started 33/53/3174, complicated by peripheral neuropathy, and interrupted after 2 cycles because of diverticular perforation requiring partial colectomy (02/13/2014)  (4) to complete 2 additional cycles of chemotherapy starting 04/24/2014, consisting of carboplatin given day 1 and gemcitabine days 1 and 8 of each 21 day cycle, with Neulasta on day 9.  (5) adjuvant radiation to follow chemotherapy; patient intends to receive this in [place]  (6) consider antiestrogen therapy after radiation  (7) diverticulitis, status post partial colectomy with colostomy placement may 05/18/2014, scheduled for colostomy reversal mid-September  PLAN: Shantanique is tolerating treatment quite well. She will have the day 8 part of her first cycle of chemotherapy August 3. She will then complete her treatments August 17 and 24. Of course she received Neulasta on day 9.  I am placing a referral to Dr. Donella Stade in [place] so they can start planning the radiation, likely to start in October, once she has recovered from her colostomy takedown.  Martha Evans has a good understanding of the overall plan. She agrees with that. She knows the goal of treatment in her case is cure. She will call with any problems that may develop before her next visit here.  Chauncey Cruel, MD   04/28/2014 1:26 PM

## 2014-05-01 ENCOUNTER — Other Ambulatory Visit (HOSPITAL_BASED_OUTPATIENT_CLINIC_OR_DEPARTMENT_OTHER): Payer: Medicare Other

## 2014-05-01 ENCOUNTER — Ambulatory Visit (HOSPITAL_BASED_OUTPATIENT_CLINIC_OR_DEPARTMENT_OTHER): Payer: Medicare Other

## 2014-05-01 VITALS — BP 121/59 | HR 84 | Temp 98.4°F | Resp 20

## 2014-05-01 DIAGNOSIS — Z5111 Encounter for antineoplastic chemotherapy: Secondary | ICD-10-CM

## 2014-05-01 DIAGNOSIS — C50519 Malignant neoplasm of lower-outer quadrant of unspecified female breast: Secondary | ICD-10-CM

## 2014-05-01 DIAGNOSIS — C50511 Malignant neoplasm of lower-outer quadrant of right female breast: Secondary | ICD-10-CM

## 2014-05-01 LAB — CBC WITH DIFFERENTIAL/PLATELET
BASO%: 0.6 % (ref 0.0–2.0)
BASOS ABS: 0 10*3/uL (ref 0.0–0.1)
EOS%: 2.5 % (ref 0.0–7.0)
Eosinophils Absolute: 0.1 10*3/uL (ref 0.0–0.5)
HCT: 31.9 % — ABNORMAL LOW (ref 34.8–46.6)
HEMOGLOBIN: 10.4 g/dL — AB (ref 11.6–15.9)
LYMPH#: 1.1 10*3/uL (ref 0.9–3.3)
LYMPH%: 33.5 % (ref 14.0–49.7)
MCH: 28.9 pg (ref 25.1–34.0)
MCHC: 32.6 g/dL (ref 31.5–36.0)
MCV: 88.6 fL (ref 79.5–101.0)
MONO#: 0.1 10*3/uL (ref 0.1–0.9)
MONO%: 3.4 % (ref 0.0–14.0)
NEUT%: 60 % (ref 38.4–76.8)
NEUTROS ABS: 2 10*3/uL (ref 1.5–6.5)
Platelets: 194 10*3/uL (ref 145–400)
RBC: 3.6 10*6/uL — ABNORMAL LOW (ref 3.70–5.45)
RDW: 13.6 % (ref 11.2–14.5)
WBC: 3.3 10*3/uL — AB (ref 3.9–10.3)
nRBC: 0 % (ref 0–0)

## 2014-05-01 MED ORDER — SODIUM CHLORIDE 0.9 % IV SOLN
Freq: Once | INTRAVENOUS | Status: AC
  Administered 2014-05-01: 14:00:00 via INTRAVENOUS

## 2014-05-01 MED ORDER — PROCHLORPERAZINE MALEATE 10 MG PO TABS
10.0000 mg | ORAL_TABLET | Freq: Once | ORAL | Status: AC
  Administered 2014-05-01: 10 mg via ORAL

## 2014-05-01 MED ORDER — SODIUM CHLORIDE 0.9 % IJ SOLN
10.0000 mL | INTRAMUSCULAR | Status: DC | PRN
  Administered 2014-05-01: 10 mL
  Filled 2014-05-01: qty 10

## 2014-05-01 MED ORDER — PROCHLORPERAZINE MALEATE 10 MG PO TABS
ORAL_TABLET | ORAL | Status: AC
  Filled 2014-05-01: qty 1

## 2014-05-01 MED ORDER — HEPARIN SOD (PORK) LOCK FLUSH 100 UNIT/ML IV SOLN
500.0000 [IU] | Freq: Once | INTRAVENOUS | Status: AC | PRN
  Administered 2014-05-01: 500 [IU]
  Filled 2014-05-01: qty 5

## 2014-05-01 MED ORDER — SODIUM CHLORIDE 0.9 % IV SOLN
800.0000 mg/m2 | Freq: Once | INTRAVENOUS | Status: AC
  Administered 2014-05-01: 1520 mg via INTRAVENOUS
  Filled 2014-05-01: qty 39.98

## 2014-05-01 NOTE — Patient Instructions (Signed)
[facility] Discharge Instructions for Patients Receiving Chemotherapy  Today you received the following chemotherapy agents :  Gemcitabine.  To help prevent nausea and vomiting after your treatment, we encourage you to take your nausea medication as specifically instructed by Dr. Jana Hakim.   If you develop nausea and vomiting that is not controlled by your nausea medication, call the clinic.   BELOW ARE SYMPTOMS THAT SHOULD BE REPORTED IMMEDIATELY:  *FEVER GREATER THAN 100.5 F  *CHILLS WITH OR WITHOUT FEVER  NAUSEA AND VOMITING THAT IS NOT CONTROLLED WITH YOUR NAUSEA MEDICATION  *UNUSUAL SHORTNESS OF BREATH  *UNUSUAL BRUISING OR BLEEDING  TENDERNESS IN MOUTH AND THROAT WITH OR WITHOUT PRESENCE OF ULCERS  *URINARY PROBLEMS  *BOWEL PROBLEMS  UNUSUAL RASH Items with * indicate a potential emergency and should be followed up as soon as possible.  Feel free to call the clinic you have any questions or concerns. The clinic phone number is [phone].

## 2014-05-02 ENCOUNTER — Ambulatory Visit: Payer: Medicare Other

## 2014-05-02 ENCOUNTER — Ambulatory Visit (HOSPITAL_BASED_OUTPATIENT_CLINIC_OR_DEPARTMENT_OTHER): Payer: Medicare Other

## 2014-05-02 VITALS — BP 145/76 | HR 82 | Temp 97.8°F

## 2014-05-02 DIAGNOSIS — C50519 Malignant neoplasm of lower-outer quadrant of unspecified female breast: Secondary | ICD-10-CM

## 2014-05-02 DIAGNOSIS — Z5189 Encounter for other specified aftercare: Secondary | ICD-10-CM

## 2014-05-02 DIAGNOSIS — C50511 Malignant neoplasm of lower-outer quadrant of right female breast: Secondary | ICD-10-CM

## 2014-05-02 MED ORDER — PEGFILGRASTIM INJECTION 6 MG/0.6ML
6.0000 mg | Freq: Once | SUBCUTANEOUS | Status: AC
  Administered 2014-05-02: 6 mg via SUBCUTANEOUS
  Filled 2014-05-02: qty 0.6

## 2014-05-02 NOTE — Patient Instructions (Signed)
Pegfilgrastim injection What is this medicine? PEGFILGRASTIM (peg fil GRA stim) is a long-acting granulocyte colony-stimulating factor that stimulates the growth of neutrophils, a type of white blood cell important in the body's fight against infection. It is used to reduce the incidence of fever and infection in patients with certain types of cancer who are receiving chemotherapy that affects the bone marrow. This medicine may be used for other purposes; ask your health care provider or pharmacist if you have questions. COMMON BRAND NAME(S): Neulasta What should I tell my health care provider before I take this medicine? They need to know if you have any of these conditions: -latex allergy -ongoing radiation therapy -sickle cell disease -skin reactions to acrylic adhesives (On-Body Injector only) -an unusual or allergic reaction to pegfilgrastim, filgrastim, other medicines, foods, dyes, or preservatives -pregnant or trying to get pregnant -breast-feeding How should I use this medicine? This medicine is for injection under the skin. If you get this medicine at home, you will be taught how to prepare and give the pre-filled syringe or how to use the On-body Injector. Refer to the patient Instructions for Use for detailed instructions. Use exactly as directed. Take your medicine at regular intervals. Do not take your medicine more often than directed. It is important that you put your used needles and syringes in a special sharps container. Do not put them in a trash can. If you do not have a sharps container, call your pharmacist or healthcare provider to get one. Talk to your pediatrician regarding the use of this medicine in children. Special care may be needed. Overdosage: If you think you have taken too much of this medicine contact a poison control center or emergency room at once. NOTE: This medicine is only for you. Do not share this medicine with others. What if I miss a dose? It is  important not to miss your dose. Call your doctor or health care professional if you miss your dose. If you miss a dose due to an On-body Injector failure or leakage, a new dose should be administered as soon as possible using a single prefilled syringe for manual use. What may interact with this medicine? Interactions have not been studied. Give your health care provider a list of all the medicines, herbs, non-prescription drugs, or dietary supplements you use. Also tell them if you smoke, drink alcohol, or use illegal drugs. Some items may interact with your medicine. This list may not describe all possible interactions. Give your health care provider a list of all the medicines, herbs, non-prescription drugs, or dietary supplements you use. Also tell them if you smoke, drink alcohol, or use illegal drugs. Some items may interact with your medicine. What should I watch for while using this medicine? You may need blood work done while you are taking this medicine. If you are going to need a MRI, CT scan, or other procedure, tell your doctor that you are using this medicine (On-Body Injector only). What side effects may I notice from receiving this medicine? Side effects that you should report to your doctor or health care professional as soon as possible: -allergic reactions like skin rash, itching or hives, swelling of the face, lips, or tongue -dizziness -fever -pain, redness, or irritation at site where injected -pinpoint red spots on the skin -shortness of breath or breathing problems -stomach or side pain, or pain at the shoulder -swelling -tiredness -trouble passing urine Side effects that usually do not require medical attention (report to your doctor   or health care professional if they continue or are bothersome): -bone pain -muscle pain This list may not describe all possible side effects. Call your doctor for medical advice about side effects. You may report side effects to FDA at  1-800-FDA-1088. Where should I keep my medicine? Keep out of the reach of children. Store pre-filled syringes in a refrigerator between 2 and 8 degrees C (36 and 46 degrees F). Do not freeze. Keep in carton to protect from light. Throw away this medicine if it is left out of the refrigerator for more than 48 hours. Throw away any unused medicine after the expiration date. NOTE: This sheet is a summary. It may not cover all possible information. If you have questions about this medicine, talk to your doctor, pharmacist, or health care provider.  2015, Elsevier/Gold Standard. (2013-12-15 16:14:05)  

## 2014-05-03 ENCOUNTER — Telehealth: Payer: Self-pay | Admitting: Oncology

## 2014-05-03 NOTE — Telephone Encounter (Signed)
S/w pt gave new appt time for 8/17 and asked her to collect a revised appt calendar then. Pt says she has a "sore spot" on her tongue and she's not sure if it's from eating too many tomatoes or from the "new" chemo shes's on but she asked that I inform the RN and have then call her back. LVM for desk RN to contact pt regarding issue.

## 2014-05-04 ENCOUNTER — Telehealth: Payer: Self-pay | Admitting: *Deleted

## 2014-05-04 NOTE — Telephone Encounter (Signed)
Per [facility] staff message scheduled appts. Advised scheduler 

## 2014-05-15 ENCOUNTER — Other Ambulatory Visit (HOSPITAL_BASED_OUTPATIENT_CLINIC_OR_DEPARTMENT_OTHER): Payer: Medicare Other

## 2014-05-15 ENCOUNTER — Ambulatory Visit (HOSPITAL_BASED_OUTPATIENT_CLINIC_OR_DEPARTMENT_OTHER): Payer: Medicare Other | Admitting: Adult Health

## 2014-05-15 ENCOUNTER — Ambulatory Visit (HOSPITAL_BASED_OUTPATIENT_CLINIC_OR_DEPARTMENT_OTHER): Payer: Medicare Other

## 2014-05-15 ENCOUNTER — Encounter: Payer: Self-pay | Admitting: General Practice

## 2014-05-15 ENCOUNTER — Other Ambulatory Visit: Payer: Self-pay | Admitting: Oncology

## 2014-05-15 ENCOUNTER — Ambulatory Visit: Payer: Medicare Other

## 2014-05-15 ENCOUNTER — Encounter: Payer: Self-pay | Admitting: Adult Health

## 2014-05-15 VITALS — BP 110/75 | HR 93 | Temp 98.1°F | Resp 20 | Ht 65.0 in | Wt 184.0 lb

## 2014-05-15 DIAGNOSIS — C50519 Malignant neoplasm of lower-outer quadrant of unspecified female breast: Secondary | ICD-10-CM

## 2014-05-15 DIAGNOSIS — C50511 Malignant neoplasm of lower-outer quadrant of right female breast: Secondary | ICD-10-CM

## 2014-05-15 DIAGNOSIS — G620 Drug-induced polyneuropathy: Secondary | ICD-10-CM

## 2014-05-15 DIAGNOSIS — Z5111 Encounter for antineoplastic chemotherapy: Secondary | ICD-10-CM

## 2014-05-15 DIAGNOSIS — Z170 Estrogen receptor positive status [ER+]: Secondary | ICD-10-CM

## 2014-05-15 LAB — CBC WITH DIFFERENTIAL/PLATELET
BASO%: 0.5 % (ref 0.0–2.0)
Basophils Absolute: 0.1 10*3/uL (ref 0.0–0.1)
EOS ABS: 0 10*3/uL (ref 0.0–0.5)
EOS%: 0.4 % (ref 0.0–7.0)
HEMATOCRIT: 29.7 % — AB (ref 34.8–46.6)
HEMOGLOBIN: 9.6 g/dL — AB (ref 11.6–15.9)
LYMPH#: 1.9 10*3/uL (ref 0.9–3.3)
LYMPH%: 17.3 % (ref 14.0–49.7)
MCH: 28.9 pg (ref 25.1–34.0)
MCHC: 32.3 g/dL (ref 31.5–36.0)
MCV: 89.5 fL (ref 79.5–101.0)
MONO#: 1 10*3/uL — ABNORMAL HIGH (ref 0.1–0.9)
MONO%: 8.9 % (ref 0.0–14.0)
NEUT#: 8.1 10*3/uL — ABNORMAL HIGH (ref 1.5–6.5)
NEUT%: 72.9 % (ref 38.4–76.8)
Platelets: 521 10*3/uL — ABNORMAL HIGH (ref 145–400)
RBC: 3.32 10*6/uL — ABNORMAL LOW (ref 3.70–5.45)
RDW: 14.1 % (ref 11.2–14.5)
WBC: 11.1 10*3/uL — ABNORMAL HIGH (ref 3.9–10.3)
nRBC: 0 % (ref 0–0)

## 2014-05-15 LAB — COMPREHENSIVE METABOLIC PANEL (CC13)
ALBUMIN: 3.7 g/dL (ref 3.5–5.0)
ALT: 18 U/L (ref 0–55)
ANION GAP: 9 meq/L (ref 3–11)
AST: 20 U/L (ref 5–34)
Alkaline Phosphatase: 121 U/L (ref 40–150)
BUN: 22.2 mg/dL (ref 7.0–26.0)
CO2: 27 meq/L (ref 22–29)
Calcium: 9.6 mg/dL (ref 8.4–10.4)
Chloride: 104 mEq/L (ref 98–109)
Creatinine: 1.1 mg/dL (ref 0.6–1.1)
GLUCOSE: 126 mg/dL (ref 70–140)
POTASSIUM: 4 meq/L (ref 3.5–5.1)
Sodium: 141 mEq/L (ref 136–145)
Total Bilirubin: <0.2 mg/dL (ref 0.20–1.20)
Total Protein: 7.1 g/dL (ref 6.4–8.3)

## 2014-05-15 MED ORDER — SODIUM CHLORIDE 0.9 % IV SOLN
800.0000 mg/m2 | Freq: Once | INTRAVENOUS | Status: AC
  Administered 2014-05-15: 1520 mg via INTRAVENOUS
  Filled 2014-05-15: qty 39.98

## 2014-05-15 MED ORDER — DEXAMETHASONE SODIUM PHOSPHATE 20 MG/5ML IJ SOLN
INTRAMUSCULAR | Status: AC
  Filled 2014-05-15: qty 5

## 2014-05-15 MED ORDER — SODIUM CHLORIDE 0.9 % IV SOLN
479.0000 mg | Freq: Once | INTRAVENOUS | Status: AC
  Administered 2014-05-15: 480 mg via INTRAVENOUS
  Filled 2014-05-15: qty 48

## 2014-05-15 MED ORDER — ONDANSETRON 16 MG/50ML IVPB (CHCC)
16.0000 mg | Freq: Once | INTRAVENOUS | Status: AC
  Administered 2014-05-15: 16 mg via INTRAVENOUS

## 2014-05-15 MED ORDER — HEPARIN SOD (PORK) LOCK FLUSH 100 UNIT/ML IV SOLN
500.0000 [IU] | Freq: Once | INTRAVENOUS | Status: AC | PRN
  Administered 2014-05-15: 500 [IU]
  Filled 2014-05-15: qty 5

## 2014-05-15 MED ORDER — DEXAMETHASONE SODIUM PHOSPHATE 20 MG/5ML IJ SOLN
20.0000 mg | Freq: Once | INTRAMUSCULAR | Status: AC
  Administered 2014-05-15: 20 mg via INTRAVENOUS

## 2014-05-15 MED ORDER — SODIUM CHLORIDE 0.9 % IJ SOLN
10.0000 mL | INTRAMUSCULAR | Status: DC | PRN
  Administered 2014-05-15: 10 mL
  Filled 2014-05-15: qty 10

## 2014-05-15 MED ORDER — SODIUM CHLORIDE 0.9 % IV SOLN
Freq: Once | INTRAVENOUS | Status: AC
  Administered 2014-05-15: 16:00:00 via INTRAVENOUS

## 2014-05-15 MED ORDER — ONDANSETRON 16 MG/50ML IVPB (CHCC)
INTRAVENOUS | Status: AC
  Filled 2014-05-15: qty 16

## 2014-05-15 NOTE — Patient Instructions (Signed)
[facility] Discharge Instructions for Patients Receiving Chemotherapy  Today you received the following chemotherapy agents Gemzar and Carboplatin.  To help prevent nausea and vomiting after your treatment, we encourage you to take your nausea medication.   If you develop nausea and vomiting that is not controlled by your nausea medication, call the clinic.   BELOW ARE SYMPTOMS THAT SHOULD BE REPORTED IMMEDIATELY:  *FEVER GREATER THAN 100.5 F  *CHILLS WITH OR WITHOUT FEVER  NAUSEA AND VOMITING THAT IS NOT CONTROLLED WITH YOUR NAUSEA MEDICATION  *UNUSUAL SHORTNESS OF BREATH  *UNUSUAL BRUISING OR BLEEDING  TENDERNESS IN MOUTH AND THROAT WITH OR WITHOUT PRESENCE OF ULCERS  *URINARY PROBLEMS  *BOWEL PROBLEMS  UNUSUAL RASH Items with * indicate a potential emergency and should be followed up as soon as possible.  Feel free to call the clinic you have any questions or concerns. The clinic phone number is [phone].    

## 2014-05-15 NOTE — Progress Notes (Signed)
Visited with Martha Evans in [place].  Acquainted from inpt care at [facility].  She was in good spirits, looking forward to last radiation tx, using gratitude to cope, and anticipating (previously scheduled) beach trip this fall as celebration of tx completion and life in general.  Provided pastoral presence, reflective listening, encouragement, and affirmation. Pt ongoingly appreciative of chaplain connections.  [place], [place]

## 2014-05-15 NOTE — Progress Notes (Signed)
Martha Evans  Telephone:([phone] Fax:(336) [phone]     ID: Martha Evans DOB: [birth year]  MR#: [id number]  LYY#:[id number]  Patient Care Team: [name], MD as PCP - General (Family Medicine) Teodoro Spray, MD as Consulting Physician (Cardiology) Earnstine Regal, MD as Consulting Physician (General Surgery) Thea Silversmith, MD as Consulting Physician (Radiation Oncology) Chauncey Cruel, MD as Consulting Physician (Oncology) Armstead Peaks, MD as Consulting Physician  CHIEF COMPLAINT: Metaplastic breast cancer  CURRENT TREATMENT:  adjuvant chemotherapy   BREAST CANCER HISTORY: From a Dr. Laurelyn Sickle initial intake note 12/21/2013:  "Martha Evans is a 68 y.o. female. Would medical history significant for hypertension gastroesophageal reflux disease. Patient underwent bilateral breast reductions in the 1980s. In February 2015 she had screening mammograms performed that showed a mass in the central right breast. She had ultrasound performed which revealed a 6 x 4 x 6 mm high-grade calcifications. Biopsy of these calcifications revealed invasive ductal carcinoma. On 12/12/2013 patient underwent a lumpectomy without sentinel lymph node biopsy. 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 Tumor was ER +2% PR +2% HER-2/neu negative with an elevated Ki-67 of 84%. Of note patient [could not] undergo a sentinel lymph node biopsy because of previous breast reduction."  The patient was started on adjuvant chemotherapy with cyclophosphamide and docetaxel 01/16/2014, but treatment was interrupted by a perforated diverticulum requiring partial colectomy 02/13/2014.   Her subsequent history is as detailed below  INTERVAL HISTORY: Martha Evans returns today for followup of her breast cancer. Today is day 1 cycle 2 of her to planned carboplatin and gemcitabine cycles, with the carboplatin given on day 1, and the gemcitabine given on days 1 and 8 of  each 21 day cycle, with Neulasta on day 9.  Martha Evans is doing well today.  She is here for evaluation prior to Gemcitabine/Carboplatin due today.  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  She is tolerating treatment with Gemcitabine/Carbo well.  She denies fevers, chills, nausea, vomiting, constipation, diarrhea, or any further concerns.    REVIEW OF SYSTEMS: A 10 point review of systems was conducted and is otherwise negative except for what is noted above.     PAST MEDICAL HISTORY: Past Medical History  Diagnosis Date  . Hypertension   . Allergy   . Wears hearing aid     right  . GERD (gastroesophageal reflux disease)   . Complication of anesthesia     was hard to intubate with thyroid surgery-99  . Difficult intubation 1999    when had thyroid lobectomy  . Dysrhythmia     irregular reuglar heart beat   . Cancer     breast cancer     PAST SURGICAL HISTORY: Past Surgical History  Procedure Laterality Date  . Thyroid lobectomy  1999    right  . Rotator cuff repair  2006    right shoulder  . Breast reduction surgery Bilateral     1980's  . Abdominal hysterectomy  1999  . Tonsillectomy      as child  . Appendectomy      as child  . Colonoscopy    . Partial mastectomy with needle localization and axillary sentinel lymph node bx Right 12/12/2013    Procedure: PARTIAL MASTECTOMY WITH NEEDLE LOCALIZATION AND AXILLARY SENTINEL LYMPH NODE BX;  Surgeon: Earnstine Regal, MD;  Location: [facility];  Service: General;  Laterality: Right;  .  Portacath placement Left 01/02/2014    Procedure: INSERTION PORT-A-CATH;  Surgeon: Earnstine Regal, MD;  Location: [facility];  Service: General;  Laterality: Left;  . Laparotomy N/A 02/11/2014    Procedure: EXPLORATORY LAPAROTOMY, COLON RESECTION, COLOSTOMY;  Surgeon: Shann Medal, MD;  Location: [facility];  Service: General;  Laterality: N/A;    FAMILY HISTORY Family  History  Problem Relation Age of Onset  . Cancer Father     bladder & pancreatic  The patient's father died from pancreatic cancer the age of 31. The patient's mother is alive, age 9, with significant Alzheimer's disease. The patient had one brother, no sisters. There are no first-degree relatives with breast or ovarian cancer.  GYNECOLOGIC HISTORY:  No LMP recorded. Patient is postmenopausal. Menarche age 26. The patient is GX P0. She underwent hysterectomy with bilateral salpingo-oophorectomy in 1999. She used an estrogen patch for several years after that.   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 Her husband, Martha Evans, works as a Armed forces logistics/support/administrative officer. He has 3 children from a prior marriage, living in [place], [place], and both work. The patient attends a local [employer]    ADVANCED DIRECTIVES: Not in place   HEALTH MAINTENANCE: History  Substance Use Topics  . Smoking status: Never Smoker   . Smokeless tobacco: Never Used  . Alcohol Use: Yes     Comment: seldom     Colonoscopy:  PAP: Status post hysterectomy  Bone density:  Lipid panel:  Allergies  Allergen Reactions  . Codeine Sulfate Nausea And Vomiting  . Tramadol Nausea Only    Nausea   . Hydrocodone Nausea Only  . Oxycodone Nausea Only    Current Outpatient Prescriptions  Medication Sig Dispense Refill  . b complex vitamins tablet Take 1 tablet by mouth daily.      . calcium carbonate (TUMS - DOSED IN MG ELEMENTAL CALCIUM) 500 MG chewable tablet Chew 1 tablet by mouth daily. As needed      . calcium-vitamin D (OSCAL WITH D) 500-200 MG-UNIT per tablet Take 1 tablet by mouth daily.       Marland Kitchen dexamethasone (DECADRON) 4 MG tablet Take 2 tablets (8 mg total) by mouth 2 (two) times daily with a meal. Start the day after chemotherapy for 3 days.  30 tablet  1  . lidocaine-prilocaine (EMLA) cream Apply topically as needed.  30 g  6  . omeprazole (PRILOSEC) 20  MG capsule Take 20 mg by mouth 2 (two) times daily before a meal.       . ondansetron (ZOFRAN) 8 MG tablet Take 1 tablet (8 mg total) by mouth 2 (two) times daily. Start the day after chemo for 3 days. Then take as needed for nausea or vomiting.  30 tablet  1  . telmisartan (MICARDIS) 40 MG tablet Take 40 mg by mouth daily.      Marland Kitchen loratadine (CLARITIN) 10 MG tablet Take 10 mg by mouth daily. lst dose 12/11/13      . polyethylene glycol (MIRALAX / GLYCOLAX) packet Take 17 g by mouth daily as needed for mild constipation.      . sennosides-docusate sodium (SENOKOT-S) 8.6-50 MG tablet Take 1 tablet by mouth daily.       No current facility-administered medications for this visit.    OBJECTIVE: Middle-aged white woman who appears stated age 72 Vitals:   05/15/14 1450  BP: 110/75  Pulse: 93  Temp: 98.1 F (  36.7 C)  Resp: 20     Body mass index is 30.62 kg/(m^2).    ECOG FS:1 - Symptomatic but completely ambulatory  Ocular: Sclerae unicteric, pupils round and equal Ear-nose-throat: Oropharynx clear, no thrush or other lesions Lymphatic: No cervical or supraclavicular adenopathy Lungs no rales or rhonchi Heart regular rate and rhythm Abd soft, nontender, positive bowel sounds, colostomy in place with normal stool in bag, stoma is pink, and surgical sites are well healed MSK no focal spinal tenderness, no upper extremity lymphedema Neuro: non-focal, well-oriented, positive affect Breasts: Deferred   LAB RESULTS:  CMP     Component Value Date/Time   NA 141 05/15/2014 1403   NA 142 02/24/2014 0525   K 4.0 05/15/2014 1403   K 3.9 02/24/2014 0525   CL 105 02/24/2014 0525   CO2 27 05/15/2014 1403   CO2 26 02/24/2014 0525   GLUCOSE 126 05/15/2014 1403   GLUCOSE 95 02/24/2014 0525   BUN 22.2 05/15/2014 1403   BUN 13 02/24/2014 0525   CREATININE 1.1 05/15/2014 1403   CREATININE 2.47* 02/24/2014 0525   CALCIUM 9.6 05/15/2014 1403   CALCIUM 9.1 02/24/2014 0525   PROT 7.1 05/15/2014 1403   PROT  5.5* 02/15/2014 0329   ALBUMIN 3.7 05/15/2014 1403   ALBUMIN 2.2* 02/15/2014 0329   AST 20 05/15/2014 1403   AST 19 02/15/2014 0329   ALT 18 05/15/2014 1403   ALT 21 02/15/2014 0329   ALKPHOS 121 05/15/2014 1403   ALKPHOS 107 02/15/2014 0329   BILITOT <0.20 05/15/2014 1403   BILITOT 0.6 02/15/2014 0329   GFRNONAA 19* 02/24/2014 0525   GFRAA 22* 02/24/2014 0525    I No results found for this basename: SPEP,  UPEP,   kappa and lambda light chains    Lab Results  Component Value Date   WBC 11.1* 05/15/2014   NEUTROABS 8.1* 05/15/2014   HGB 9.6* 05/15/2014   HCT 29.7* 05/15/2014   MCV 89.5 05/15/2014   PLT 521* 05/15/2014      Chemistry      Component Value Date/Time   NA 141 05/15/2014 1403   NA 142 02/24/2014 0525   K 4.0 05/15/2014 1403   K 3.9 02/24/2014 0525   CL 105 02/24/2014 0525   CO2 27 05/15/2014 1403   CO2 26 02/24/2014 0525   BUN 22.2 05/15/2014 1403   BUN 13 02/24/2014 0525   CREATININE 1.1 05/15/2014 1403   CREATININE 2.47* 02/24/2014 0525      Component Value Date/Time   CALCIUM 9.6 05/15/2014 1403   CALCIUM 9.1 02/24/2014 0525   ALKPHOS 121 05/15/2014 1403   ALKPHOS 107 02/15/2014 0329   AST 20 05/15/2014 1403   AST 19 02/15/2014 0329   ALT 18 05/15/2014 1403   ALT 21 02/15/2014 0329   BILITOT <0.20 05/15/2014 1403   BILITOT 0.6 02/15/2014 0329       No results found for this basename: LABCA2    No components found with this basename: [id number]    No results found for this basename: INR,  in the last 168 hours  Urinalysis    Component Value Date/Time   COLORURINE YELLOW 02/17/2014 1639   APPEARANCEUR CLOUDY* 02/17/2014 1639   LABSPEC 1.016 02/17/2014 1639   PHURINE 5.0 02/17/2014 [place] 02/17/2014 [place] 02/17/2014 [place] 02/17/2014 [place] 02/17/2014 1639   PROTEINUR 30* 02/17/2014 1639   UROBILINOGEN 1.0 02/17/2014 1639  NITRITE NEGATIVE 02/17/2014 1639   LEUKOCYTESUR NEGATIVE 02/17/2014 1639     STUDIES: No results found.  ASSESSMENT: 68 y.o. Martha Evans, Martha Evans woman  (1) status post right breast biopsy 12/01/2013 for an invasive ductal carcinoma, grade 2 or 3, estrogen receptor 2% "positive", progesterone receptor 2% "positive", both with moderate staining intensity, with an MIB-1 of 84%, and no HER-2 amplification  (2) status post right lumpectomy 12/12/2013 ([id number]) for a pT1b pNX invasive ductal carcinoma, metaplastic, high-grade, estrogen and progesterone receptor negative, repeat HER-2 again negative. Because of prior reduction mammoplasty sentinel lymph node sampling was not performed  (3) adjuvant chemotherapy with cyclophosphamide and docetaxel started 82/80/0349, complicated by peripheral neuropathy, and interrupted after 2 cycles because of diverticular perforation requiring partial colectomy (02/13/2014)  (4) to complete 2 additional cycles of chemotherapy starting 04/24/2014, consisting of carboplatin given day 1 and gemcitabine days 1 and 8 of each 21 day cycle, with Neulasta on day 9.  (5) adjuvant radiation to follow chemotherapy; patient intends to receive this in [place]  (6) consider antiestrogen therapy after radiation  (7) diverticulitis, status post partial colectomy with colostomy placement may 05/18/2014, scheduled for colostomy reversal mid-September  PLAN:  Martha Evans is doing well today and will proceed with treatment.  I reviewed her lab work with her today in detail.  She does have mild anemia that is stable and likely treatment related.  Her CMP is normal.  She will proceed with evaluation by Dr. Baruch Gouty in radiation oncology at [facility].   I recommended Martha Evans continue her b complex vitamins for her neuropathy.    Martha Evans will return in one week for labs, evaluation, and Gemcitabine.    Martha Evans has a good understanding of the overall plan. She agrees with that. She knows the goal of treatment in her case is cure. She will call with any problems that may  develop before her next visit here.  I spent 15 minutes counseling the patient face to face.  The total time spent in the appointment was 30 minutes.  Minette Headland, [place] [phone]  05/15/2014 3:27 PM

## 2014-05-22 ENCOUNTER — Ambulatory Visit: Payer: Medicare Other

## 2014-05-22 ENCOUNTER — Ambulatory Visit (HOSPITAL_BASED_OUTPATIENT_CLINIC_OR_DEPARTMENT_OTHER): Payer: Medicare Other | Admitting: Nurse Practitioner

## 2014-05-22 ENCOUNTER — Other Ambulatory Visit (HOSPITAL_BASED_OUTPATIENT_CLINIC_OR_DEPARTMENT_OTHER): Payer: Medicare Other

## 2014-05-22 ENCOUNTER — Encounter: Payer: Self-pay | Admitting: Nurse Practitioner

## 2014-05-22 ENCOUNTER — Ambulatory Visit (HOSPITAL_BASED_OUTPATIENT_CLINIC_OR_DEPARTMENT_OTHER): Payer: Medicare Other

## 2014-05-22 VITALS — BP 138/54 | HR 80 | Temp 98.5°F | Resp 18 | Ht 65.0 in | Wt 180.7 lb

## 2014-05-22 DIAGNOSIS — C50519 Malignant neoplasm of lower-outer quadrant of unspecified female breast: Secondary | ICD-10-CM

## 2014-05-22 DIAGNOSIS — R5381 Other malaise: Secondary | ICD-10-CM

## 2014-05-22 DIAGNOSIS — C50511 Malignant neoplasm of lower-outer quadrant of right female breast: Secondary | ICD-10-CM

## 2014-05-22 DIAGNOSIS — R5383 Other fatigue: Secondary | ICD-10-CM

## 2014-05-22 DIAGNOSIS — D702 Other drug-induced agranulocytosis: Secondary | ICD-10-CM | POA: Insufficient documentation

## 2014-05-22 DIAGNOSIS — K5732 Diverticulitis of large intestine without perforation or abscess without bleeding: Secondary | ICD-10-CM

## 2014-05-22 DIAGNOSIS — Z5111 Encounter for antineoplastic chemotherapy: Secondary | ICD-10-CM

## 2014-05-22 LAB — CBC WITH DIFFERENTIAL/PLATELET
BASO%: 2.5 % — ABNORMAL HIGH (ref 0.0–2.0)
BASOS ABS: 0.1 10*3/uL (ref 0.0–0.1)
EOS%: 0.2 % (ref 0.0–7.0)
Eosinophils Absolute: 0 10*3/uL (ref 0.0–0.5)
HEMATOCRIT: 30.9 % — AB (ref 34.8–46.6)
HEMOGLOBIN: 10.2 g/dL — AB (ref 11.6–15.9)
LYMPH#: 0.8 10*3/uL — AB (ref 0.9–3.3)
LYMPH%: 37.8 % (ref 14.0–49.7)
MCH: 28.9 pg (ref 25.1–34.0)
MCHC: 33 g/dL (ref 31.5–36.0)
MCV: 87.6 fL (ref 79.5–101.0)
MONO#: 0.4 10*3/uL (ref 0.1–0.9)
MONO%: 17.9 % — ABNORMAL HIGH (ref 0.0–14.0)
NEUT#: 0.9 10*3/uL — ABNORMAL LOW (ref 1.5–6.5)
NEUT%: 41.6 % (ref 38.4–76.8)
Platelets: 422 10*3/uL — ABNORMAL HIGH (ref 145–400)
RBC: 3.53 10*6/uL — ABNORMAL LOW (ref 3.70–5.45)
RDW: 14.3 % (ref 11.2–14.5)
WBC: 2.2 10*3/uL — AB (ref 3.9–10.3)

## 2014-05-22 LAB — COMPREHENSIVE METABOLIC PANEL (CC13)
ALBUMIN: 4 g/dL (ref 3.5–5.0)
ALT: 33 U/L (ref 0–55)
ANION GAP: 9 meq/L (ref 3–11)
AST: 25 U/L (ref 5–34)
Alkaline Phosphatase: 123 U/L (ref 40–150)
BUN: 26.6 mg/dL — AB (ref 7.0–26.0)
CALCIUM: 9.6 mg/dL (ref 8.4–10.4)
CHLORIDE: 103 meq/L (ref 98–109)
CO2: 27 meq/L (ref 22–29)
Creatinine: 1.1 mg/dL (ref 0.6–1.1)
Glucose: 93 mg/dl (ref 70–140)
POTASSIUM: 4.1 meq/L (ref 3.5–5.1)
Sodium: 138 mEq/L (ref 136–145)
Total Bilirubin: 0.33 mg/dL (ref 0.20–1.20)
Total Protein: 7.3 g/dL (ref 6.4–8.3)

## 2014-05-22 MED ORDER — SODIUM CHLORIDE 0.9 % IV SOLN
Freq: Once | INTRAVENOUS | Status: AC
  Administered 2014-05-22: 14:00:00 via INTRAVENOUS

## 2014-05-22 MED ORDER — SODIUM CHLORIDE 0.9 % IJ SOLN
10.0000 mL | INTRAMUSCULAR | Status: DC | PRN
  Administered 2014-05-22: 10 mL
  Filled 2014-05-22: qty 10

## 2014-05-22 MED ORDER — SODIUM CHLORIDE 0.9 % IV SOLN
800.0000 mg/m2 | Freq: Once | INTRAVENOUS | Status: AC
  Administered 2014-05-22: 1520 mg via INTRAVENOUS
  Filled 2014-05-22: qty 40

## 2014-05-22 MED ORDER — PROCHLORPERAZINE MALEATE 10 MG PO TABS
ORAL_TABLET | ORAL | Status: AC
  Filled 2014-05-22: qty 1

## 2014-05-22 MED ORDER — HEPARIN SOD (PORK) LOCK FLUSH 100 UNIT/ML IV SOLN
500.0000 [IU] | Freq: Once | INTRAVENOUS | Status: AC | PRN
  Administered 2014-05-22: 500 [IU]
  Filled 2014-05-22: qty 5

## 2014-05-22 MED ORDER — PROCHLORPERAZINE MALEATE 10 MG PO TABS
10.0000 mg | ORAL_TABLET | Freq: Once | ORAL | Status: AC
  Administered 2014-05-22: 10 mg via ORAL

## 2014-05-22 NOTE — Progress Notes (Signed)
[facility]  Telephone:(336) [phone] Fax:(336) [phone]     ID: Martha Evans DOB: Mar 10, 1946  MR#: [id number]  MNO#:[id number]  Patient Care Team: [name], MD as PCP - General (Family Medicine) Teodoro Spray, MD as Consulting Physician (Cardiology) Earnstine Regal, MD as Consulting Physician (General Surgery) Thea Silversmith, MD as Consulting Physician (Radiation Oncology) Chauncey Cruel, MD as Consulting Physician (Oncology) Armstead Peaks, MD as Consulting Physician  CHIEF COMPLAINT: Metaplastic breast cancer  CURRENT TREATMENT:  adjuvant chemotherapy  BREAST CANCER HISTORY: From a Dr. Laurelyn Sickle initial intake note 12/21/2013:  "Martha Evans is a 68 y.o. female. Would medical history significant for hypertension gastroesophageal reflux disease. Patient underwent bilateral breast reductions in the 1980s. In February 2015 she had screening mammograms performed that showed a mass in the central right breast. She had ultrasound performed which revealed a 6 x 4 x 6 mm high-grade calcifications. Biopsy of these calcifications revealed invasive ductal carcinoma. On 12/12/2013 patient underwent a lumpectomy without sentinel lymph node biopsy. 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 Tumor was ER +2% PR +2% HER-2/neu negative with an elevated Ki-67 of 84%. Of note patient [could not] undergo a sentinel lymph node biopsy because of previous breast reduction."  The patient was started on adjuvant chemotherapy with cyclophosphamide and docetaxel 01/16/2014, but treatment was interrupted by a perforated diverticulum requiring partial colectomy 02/13/2014.   Her subsequent history is as detailed below  INTERVAL HISTORY: Martha Evans returns today with her husband Martha Evans for follow up of her breast cancer. Today is day 8 cycle 2 of her planned carboplatin and gemcitabine cycles, with the carboplatin given on day 1, and the gemcitabine given  on days 1 and 8 of each 21 day cycle, with Neulasta on day 9.  REVIEW OF SYSTEMS: Martha Evans is fatigued today. She denies fevers, chills, nausea, vomiting, constipation or diarrhea. She still has peripheral neuropathy from her previous chemo but has been taking Vit B complex and thinks its improving. Occasionally she feels lightheaded and dizzy but once she stabilizes, she's ok.  A detailed review of systems was otherwise negative.      PAST MEDICAL HISTORY: Past Medical History  Diagnosis Date  . Hypertension   . Allergy   . Wears hearing aid     right  . GERD (gastroesophageal reflux disease)   . Complication of anesthesia     was hard to intubate with thyroid surgery-99  . Difficult intubation 1999    when had thyroid lobectomy  . Dysrhythmia     irregular reuglar heart beat   . Cancer     breast cancer     PAST SURGICAL HISTORY: Past Surgical History  Procedure Laterality Date  . Thyroid lobectomy  1999    right  . Rotator cuff repair  2006    right shoulder  . Breast reduction surgery Bilateral     1980's  . Abdominal hysterectomy  1999  . Tonsillectomy      as child  . Appendectomy      as child  . Colonoscopy    . Partial mastectomy with needle localization and axillary sentinel lymph node bx Right 12/12/2013    Procedure: PARTIAL MASTECTOMY WITH NEEDLE LOCALIZATION AND AXILLARY SENTINEL LYMPH NODE BX;  Surgeon: Earnstine Regal, MD;  Location: [facility];  Service: General;  Laterality: Right;  . Portacath placement Left 01/02/2014    Procedure: INSERTION PORT-A-CATH;  Surgeon: Earnstine Regal, MD;  Location: [facility];  Service: General;  Laterality: Left;  . Laparotomy N/A 02/11/2014    Procedure: EXPLORATORY LAPAROTOMY, COLON RESECTION, COLOSTOMY;  Surgeon: Shann Medal, MD;  Location: [facility];  Service: General;  Laterality: N/A;    FAMILY HISTORY Family History  Problem Relation Age of Onset  . Cancer Father     bladder & pancreatic  The patient's  father died from pancreatic cancer the age of 71. The patient's mother is alive, age 15, with significant Alzheimer's disease. The patient had one brother, no sisters. There are no first-degree relatives with breast or ovarian cancer.  GYNECOLOGIC HISTORY:  No LMP recorded. Patient is postmenopausal. Menarche age 82. The patient is GX P0. She underwent hysterectomy with bilateral salpingo-oophorectomy in 1999. She used an estrogen patch for several years after that.   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 Her husband, Martha Evans, works as a Armed forces logistics/support/administrative officer. He has 3 children from a prior marriage, living in [place], [place], and both work. The patient attends a local [employer]    ADVANCED DIRECTIVES: Not in place   HEALTH MAINTENANCE: History  Substance Use Topics  . Smoking status: Never Smoker   . Smokeless tobacco: Never Used  . Alcohol Use: Yes     Comment: seldom     Colonoscopy:  PAP: Status post hysterectomy  Bone density:  Lipid panel:  Allergies  Allergen Reactions  . Codeine Sulfate Nausea And Vomiting  . Tramadol Nausea Only    Nausea   . Hydrocodone Nausea Only  . Oxycodone Nausea Only    Current Outpatient Prescriptions  Medication Sig Dispense Refill  . b complex vitamins tablet Take 1 tablet by mouth daily.      . calcium carbonate (TUMS - DOSED IN MG ELEMENTAL CALCIUM) 500 MG chewable tablet Chew 1 tablet by mouth daily. As needed      . calcium-vitamin D (OSCAL WITH D) 500-200 MG-UNIT per tablet Take 1 tablet by mouth daily.       Marland Kitchen dexamethasone (DECADRON) 4 MG tablet Take 2 tablets (8 mg total) by mouth 2 (two) times daily with a meal. Start the day after chemotherapy for 3 days.  30 tablet  1  . lidocaine-prilocaine (EMLA) cream Apply topically as needed.  30 g  6  . loratadine (CLARITIN) 10 MG tablet Take 10 mg by mouth daily. lst dose 12/11/13      . omeprazole (PRILOSEC) 20 MG capsule  Take 20 mg by mouth 2 (two) times daily before a meal.       . ondansetron (ZOFRAN) 8 MG tablet Take 1 tablet (8 mg total) by mouth 2 (two) times daily. Start the day after chemo for 3 days. Then take as needed for nausea or vomiting.  30 tablet  1  . polyethylene glycol (MIRALAX / GLYCOLAX) packet Take 17 g by mouth daily as needed for mild constipation.      . sennosides-docusate sodium (SENOKOT-S) 8.6-50 MG tablet Take 1 tablet by mouth daily.      Marland Kitchen telmisartan (MICARDIS) 40 MG tablet Take 40 mg by mouth daily.       No current facility-administered medications for this visit.   Facility-Administered Medications Ordered in Other Visits  Medication Dose Route Frequency Provider Last Rate Last Dose  . 0.9 %  sodium chloride infusion   Intravenous Once Chauncey Cruel, MD      . Gemcitabine HCl (GEMZAR) 1,520  mg in sodium chloride 0.9 % 100 mL chemo infusion  800 mg/m2 (Treatment Plan Actual) Intravenous Once Chauncey Cruel, MD      . heparin lock flush 100 unit/mL  500 Units Intracatheter Once PRN Chauncey Cruel, MD      . sodium chloride 0.9 % injection 10 mL  10 mL Intracatheter PRN Chauncey Cruel, MD        OBJECTIVE: Middle-aged white woman who appears stated age 49 Vitals:   05/22/14 1136  BP: 138/54  Pulse: 80  Temp: 98.5 F (36.9 C)  Resp: 18     Body mass index is 30.07 kg/(m^2).    ECOG FS:1 - Symptomatic but completely ambulatory  Skin: warm, dry  HEENT: sclerae anicteric, conjunctivae pink, oropharynx clear. No thrush or mucositis.  Lymph Nodes: No cervical or supraclavicular lymphadenopathy  Lungs: clear to auscultation bilaterally, no rales, wheezes, or rhonci  Heart: regular rate and rhythm  Abdomen: round, soft, non tender, positive bowel sounds, colostomy in place with normal stool, stoma pink  Musculoskeletal: No focal spinal tenderness, no peripheral edema  Neuro: non focal, well oriented, positive affect  Breasts: deferred  LAB RESULTS:  CMP      Component Value Date/Time   NA 138 05/22/2014 1119   NA 142 02/24/2014 0525   K 4.1 05/22/2014 1119   K 3.9 02/24/2014 0525   CL 105 02/24/2014 0525   CO2 27 05/22/2014 1119   CO2 26 02/24/2014 0525   GLUCOSE 93 05/22/2014 1119   GLUCOSE 95 02/24/2014 0525   BUN 26.6* 05/22/2014 1119   BUN 13 02/24/2014 0525   CREATININE 1.1 05/22/2014 1119   CREATININE 2.47* 02/24/2014 0525   CALCIUM 9.6 05/22/2014 1119   CALCIUM 9.1 02/24/2014 0525   PROT 7.3 05/22/2014 1119   PROT 5.5* 02/15/2014 0329   ALBUMIN 4.0 05/22/2014 1119   ALBUMIN 2.2* 02/15/2014 0329   AST 25 05/22/2014 1119   AST 19 02/15/2014 0329   ALT 33 05/22/2014 1119   ALT 21 02/15/2014 0329   ALKPHOS 123 05/22/2014 1119   ALKPHOS 107 02/15/2014 0329   BILITOT 0.33 05/22/2014 1119   BILITOT 0.6 02/15/2014 0329   GFRNONAA 19* 02/24/2014 0525   GFRAA 22* 02/24/2014 0525    I No results found for this basename: SPEP,  UPEP,   kappa and lambda light chains    Lab Results  Component Value Date   WBC 2.2* 05/22/2014   NEUTROABS 0.9* 05/22/2014   HGB 10.2* 05/22/2014   HCT 30.9* 05/22/2014   MCV 87.6 05/22/2014   PLT 422* 05/22/2014      Chemistry      Component Value Date/Time   NA 138 05/22/2014 1119   NA 142 02/24/2014 0525   K 4.1 05/22/2014 1119   K 3.9 02/24/2014 0525   CL 105 02/24/2014 0525   CO2 27 05/22/2014 1119   CO2 26 02/24/2014 0525   BUN 26.6* 05/22/2014 1119   BUN 13 02/24/2014 0525   CREATININE 1.1 05/22/2014 1119   CREATININE 2.47* 02/24/2014 0525      Component Value Date/Time   CALCIUM 9.6 05/22/2014 1119   CALCIUM 9.1 02/24/2014 0525   ALKPHOS 123 05/22/2014 1119   ALKPHOS 107 02/15/2014 0329   AST 25 05/22/2014 1119   AST 19 02/15/2014 0329   ALT 33 05/22/2014 1119   ALT 21 02/15/2014 0329   BILITOT 0.33 05/22/2014 1119   BILITOT 0.6 02/15/2014 0329       No  results found for this basename: LABCA2    No components found with this basename: [id number]    No results found for this basename: INR,  in the last 168  hours  Urinalysis    Component Value Date/Time   COLORURINE YELLOW 02/17/2014 1639   APPEARANCEUR CLOUDY* 02/17/2014 1639   LABSPEC 1.016 02/17/2014 1639   PHURINE 5.0 02/17/2014 [place] 02/17/2014 [place] 02/17/2014 [place] 02/17/2014 [place] 02/17/2014 1639   PROTEINUR 30* 02/17/2014 1639   UROBILINOGEN 1.0 02/17/2014 1639   NITRITE NEGATIVE 02/17/2014 [place] 02/17/2014 1639    STUDIES: No results found.  ASSESSMENT: 68 y.o. [place], [place] woman  (1) status post right breast biopsy 12/01/2013 for an invasive ductal carcinoma, grade 2 or 3, estrogen receptor 2% "positive", progesterone receptor 2% "positive", both with moderate staining intensity, with an MIB-1 of 84%, and no HER-2 amplification  (2) status post right lumpectomy 12/12/2013 ([id number]) for a pT1b pNX invasive ductal carcinoma, metaplastic, high-grade, estrogen and progesterone receptor negative, repeat HER-2 again negative. Because of prior reduction mammoplasty sentinel lymph node sampling was not performed  (3) adjuvant chemotherapy with cyclophosphamide and docetaxel started 35/24/8185, complicated by peripheral neuropathy, and interrupted after 2 cycles because of diverticular perforation requiring partial colectomy (02/13/2014)  (4) to complete 2 additional cycles of chemotherapy starting 04/24/2014, consisting of carboplatin given day 1 and gemcitabine days 1 and 8 of each 21 day cycle, with Neulasta on day 9.  (5) adjuvant radiation to follow chemotherapy; patient intends to receive this in [place]  (6) consider antiestrogen therapy after radiation  (7) diverticulitis, status post partial colectomy with colostomy placement may 05/18/2014, scheduled for colostomy reversal mid-September  PLAN: Martha Evans is doing well today besides fatigue. The labs were reviewed in detail with her and it was noted that her [place] is down to 900. She  is due for a neulasta injection on day 9 (tomorrow), so we will proceed with chemotherapy today and just recheck a CBC next week to ensure the count has recovered.   Today is her final dose of chemo. She meets with Dr. Baruch Gouty in radiation oncology at [facility] this Wednesday. She will return for labs and an office visit with Dr. Jana Hakim in a few weeks on 9/30.   Donnita understands and agrees with this plan. She has been encouraged to call with any issues that might arise before her next visit here.    Marcelino Duster, [place] [phone]  05/22/2014 1:32 PM

## 2014-05-22 NOTE — Patient Instructions (Signed)
[facility] Discharge Instructions for Patients Receiving Chemotherapy  Today you received the following chemotherapy agents Gemcitabine.   To help prevent nausea and vomiting after your treatment, we encourage you to take your nausea medication as directed.    If you develop nausea and vomiting that is not controlled by your nausea medication, call the clinic.   BELOW ARE SYMPTOMS THAT SHOULD BE REPORTED IMMEDIATELY:  *FEVER GREATER THAN 100.5 F  *CHILLS WITH OR WITHOUT FEVER  NAUSEA AND VOMITING THAT IS NOT CONTROLLED WITH YOUR NAUSEA MEDICATION  *UNUSUAL SHORTNESS OF BREATH  *UNUSUAL BRUISING OR BLEEDING  TENDERNESS IN MOUTH AND THROAT WITH OR WITHOUT PRESENCE OF ULCERS  *URINARY PROBLEMS  *BOWEL PROBLEMS  UNUSUAL RASH Items with * indicate a potential emergency and should be followed up as soon as possible.  Feel free to call the clinic you have any questions or concerns. The clinic phone number is [phone].    

## 2014-05-22 NOTE — Progress Notes (Signed)
Ok to treat per MD 

## 2014-05-23 ENCOUNTER — Telehealth: Payer: Self-pay | Admitting: Oncology

## 2014-05-23 ENCOUNTER — Ambulatory Visit (HOSPITAL_BASED_OUTPATIENT_CLINIC_OR_DEPARTMENT_OTHER): Payer: Medicare Other

## 2014-05-23 VITALS — BP 153/78 | HR 90 | Temp 97.2°F

## 2014-05-23 DIAGNOSIS — C50519 Malignant neoplasm of lower-outer quadrant of unspecified female breast: Secondary | ICD-10-CM

## 2014-05-23 DIAGNOSIS — Z5189 Encounter for other specified aftercare: Secondary | ICD-10-CM

## 2014-05-23 DIAGNOSIS — C50511 Malignant neoplasm of lower-outer quadrant of right female breast: Secondary | ICD-10-CM

## 2014-05-23 MED ORDER — PEGFILGRASTIM INJECTION 6 MG/0.6ML
6.0000 mg | Freq: Once | SUBCUTANEOUS | Status: AC
  Administered 2014-05-23: 6 mg via SUBCUTANEOUS
  Filled 2014-05-23: qty 0.6

## 2014-05-23 NOTE — Telephone Encounter (Signed)
ADDED APPT FOR 8/31. LMONVM FOR PT AND LMONVM FOR DESK NURSE ASKING IF PT IS CONTINUING TX. ADDED COMMENTS TO TODAY'S APPT TO SEND PT FOR NEW SCHEDULE.

## 2014-05-24 ENCOUNTER — Ambulatory Visit: Payer: Self-pay | Admitting: Radiation Oncology

## 2014-05-29 ENCOUNTER — Other Ambulatory Visit (HOSPITAL_BASED_OUTPATIENT_CLINIC_OR_DEPARTMENT_OTHER): Payer: Medicare Other

## 2014-05-29 ENCOUNTER — Encounter: Payer: Self-pay | Admitting: General Practice

## 2014-05-29 DIAGNOSIS — C50519 Malignant neoplasm of lower-outer quadrant of unspecified female breast: Secondary | ICD-10-CM

## 2014-05-29 DIAGNOSIS — C50511 Malignant neoplasm of lower-outer quadrant of right female breast: Secondary | ICD-10-CM

## 2014-05-29 LAB — CBC WITH DIFFERENTIAL/PLATELET
BASO%: 0.3 % (ref 0.0–2.0)
Basophils Absolute: 0.1 10*3/uL (ref 0.0–0.1)
EOS ABS: 0 10*3/uL (ref 0.0–0.5)
EOS%: 0 % (ref 0.0–7.0)
HEMATOCRIT: 27.4 % — AB (ref 34.8–46.6)
HGB: 9 g/dL — ABNORMAL LOW (ref 11.6–15.9)
LYMPH%: 8.1 % — AB (ref 14.0–49.7)
MCH: 29.1 pg (ref 25.1–34.0)
MCHC: 32.8 g/dL (ref 31.5–36.0)
MCV: 88.7 fL (ref 79.5–101.0)
MONO#: 2.7 10*3/uL — AB (ref 0.1–0.9)
MONO%: 10 % (ref 0.0–14.0)
NEUT#: 22.1 10*3/uL — ABNORMAL HIGH (ref 1.5–6.5)
NEUT%: 81.6 % — ABNORMAL HIGH (ref 38.4–76.8)
PLATELETS: 84 10*3/uL — AB (ref 145–400)
RBC: 3.09 10*6/uL — ABNORMAL LOW (ref 3.70–5.45)
RDW: 14.2 % (ref 11.2–14.5)
WBC: 27.1 10*3/uL — AB (ref 3.9–10.3)
lymph#: 2.2 10*3/uL (ref 0.9–3.3)

## 2014-05-29 LAB — COMPREHENSIVE METABOLIC PANEL (CC13)
ALT: 22 U/L (ref 0–55)
ANION GAP: 9 meq/L (ref 3–11)
AST: 17 U/L (ref 5–34)
Albumin: 3.7 g/dL (ref 3.5–5.0)
Alkaline Phosphatase: 161 U/L — ABNORMAL HIGH (ref 40–150)
BUN: 17.6 mg/dL (ref 7.0–26.0)
CALCIUM: 9.3 mg/dL (ref 8.4–10.4)
CO2: 25 meq/L (ref 22–29)
CREATININE: 1.2 mg/dL — AB (ref 0.6–1.1)
Chloride: 105 mEq/L (ref 98–109)
Glucose: 122 mg/dl (ref 70–140)
Potassium: 3.7 mEq/L (ref 3.5–5.1)
Sodium: 139 mEq/L (ref 136–145)
Total Protein: 6.7 g/dL (ref 6.4–8.3)

## 2014-05-30 ENCOUNTER — Ambulatory Visit: Payer: Self-pay | Admitting: Radiation Oncology

## 2014-05-31 ENCOUNTER — Encounter: Payer: Self-pay | Admitting: Surgery

## 2014-06-09 ENCOUNTER — Other Ambulatory Visit (INDEPENDENT_AMBULATORY_CARE_PROVIDER_SITE_OTHER): Payer: Self-pay

## 2014-06-09 DIAGNOSIS — C189 Malignant neoplasm of colon, unspecified: Secondary | ICD-10-CM

## 2014-06-13 ENCOUNTER — Telehealth: Payer: Self-pay

## 2014-06-13 NOTE — Telephone Encounter (Signed)
Rcvd office note from [facility] Surgery dtd 06/09/14 Dr. Lucia Gaskins.  Copy to Dr Lindi Adie, original to scan.

## 2014-06-14 ENCOUNTER — Ambulatory Visit (HOSPITAL_COMMUNITY): Payer: Medicare Other

## 2014-06-15 ENCOUNTER — Encounter (INDEPENDENT_AMBULATORY_CARE_PROVIDER_SITE_OTHER): Payer: Medicare Other | Admitting: Surgery

## 2014-06-21 LAB — CBC CANCER CENTER
Basophil #: 0.1 x10 3/mm (ref 0.0–0.1)
Basophil %: 1.3 %
Eosinophil #: 0.1 x10 3/mm (ref 0.0–0.7)
Eosinophil %: 2.4 %
HCT: 33.1 % — ABNORMAL LOW (ref 35.0–47.0)
HGB: 10.8 g/dL — AB (ref 12.0–16.0)
Lymphocyte #: 0.9 x10 3/mm — ABNORMAL LOW (ref 1.0–3.6)
Lymphocyte %: 15.4 %
MCH: 29.6 pg (ref 26.0–34.0)
MCHC: 32.7 g/dL (ref 32.0–36.0)
MCV: 91 fL (ref 80–100)
Monocyte #: 0.7 x10 3/mm (ref 0.2–0.9)
Monocyte %: 12.2 %
NEUTROS PCT: 68.7 %
Neutrophil #: 3.9 x10 3/mm (ref 1.4–6.5)
PLATELETS: 266 x10 3/mm (ref 150–440)
RBC: 3.65 10*6/uL — AB (ref 3.80–5.20)
RDW: 18.5 % — ABNORMAL HIGH (ref 11.5–14.5)
WBC: 5.8 x10 3/mm (ref 3.6–11.0)

## 2014-06-28 ENCOUNTER — Ambulatory Visit (HOSPITAL_BASED_OUTPATIENT_CLINIC_OR_DEPARTMENT_OTHER): Payer: Medicare Other | Admitting: Oncology

## 2014-06-28 ENCOUNTER — Ambulatory Visit (HOSPITAL_BASED_OUTPATIENT_CLINIC_OR_DEPARTMENT_OTHER): Payer: Medicare Other

## 2014-06-28 ENCOUNTER — Other Ambulatory Visit (HOSPITAL_BASED_OUTPATIENT_CLINIC_OR_DEPARTMENT_OTHER): Payer: Medicare Other

## 2014-06-28 VITALS — BP 125/59 | HR 78 | Temp 98.1°F | Resp 18 | Ht 65.0 in | Wt 184.6 lb

## 2014-06-28 DIAGNOSIS — C50511 Malignant neoplasm of lower-outer quadrant of right female breast: Secondary | ICD-10-CM

## 2014-06-28 DIAGNOSIS — C50519 Malignant neoplasm of lower-outer quadrant of unspecified female breast: Secondary | ICD-10-CM

## 2014-06-28 DIAGNOSIS — K5732 Diverticulitis of large intestine without perforation or abscess without bleeding: Secondary | ICD-10-CM

## 2014-06-28 DIAGNOSIS — Z95828 Presence of other vascular implants and grafts: Secondary | ICD-10-CM

## 2014-06-28 DIAGNOSIS — K631 Perforation of intestine (nontraumatic): Secondary | ICD-10-CM

## 2014-06-28 DIAGNOSIS — Z452 Encounter for adjustment and management of vascular access device: Secondary | ICD-10-CM

## 2014-06-28 LAB — CBC WITH DIFFERENTIAL/PLATELET
BASO%: 0.8 % (ref 0.0–2.0)
Basophils Absolute: 0.1 10*3/uL (ref 0.0–0.1)
EOS%: 4.9 % (ref 0.0–7.0)
Eosinophils Absolute: 0.3 10*3/uL (ref 0.0–0.5)
HEMATOCRIT: 31.1 % — AB (ref 34.8–46.6)
HEMOGLOBIN: 10.1 g/dL — AB (ref 11.6–15.9)
LYMPH#: 1 10*3/uL (ref 0.9–3.3)
LYMPH%: 15 % (ref 14.0–49.7)
MCH: 29.3 pg (ref 25.1–34.0)
MCHC: 32.4 g/dL (ref 31.5–36.0)
MCV: 90.4 fL (ref 79.5–101.0)
MONO#: 0.7 10*3/uL (ref 0.1–0.9)
MONO%: 9.6 % (ref 0.0–14.0)
NEUT#: 4.8 10*3/uL (ref 1.5–6.5)
NEUT%: 69.7 % (ref 38.4–76.8)
Platelets: 218 10*3/uL (ref 145–400)
RBC: 3.44 10*6/uL — ABNORMAL LOW (ref 3.70–5.45)
RDW: 18.4 % — AB (ref 11.2–14.5)
WBC: 6.9 10*3/uL (ref 3.9–10.3)

## 2014-06-28 LAB — CBC CANCER CENTER
BASOS PCT: 2.2 %
Basophil #: 0.1 x10 3/mm (ref 0.0–0.1)
EOS ABS: 0.3 x10 3/mm (ref 0.0–0.7)
EOS PCT: 5.7 %
HCT: 31.8 % — AB (ref 35.0–47.0)
HGB: 10.2 g/dL — ABNORMAL LOW (ref 12.0–16.0)
Lymphocyte #: 0.7 x10 3/mm — ABNORMAL LOW (ref 1.0–3.6)
Lymphocyte %: 13.4 %
MCH: 29.4 pg (ref 26.0–34.0)
MCHC: 32.2 g/dL (ref 32.0–36.0)
MCV: 91 fL (ref 80–100)
MONOS PCT: 12 %
Monocyte #: 0.6 x10 3/mm (ref 0.2–0.9)
Neutrophil #: 3.3 x10 3/mm (ref 1.4–6.5)
Neutrophil %: 66.7 %
PLATELETS: 207 x10 3/mm (ref 150–440)
RBC: 3.48 10*6/uL — ABNORMAL LOW (ref 3.80–5.20)
RDW: 18.2 % — ABNORMAL HIGH (ref 11.5–14.5)
WBC: 5 x10 3/mm (ref 3.6–11.0)

## 2014-06-28 LAB — COMPREHENSIVE METABOLIC PANEL (CC13)
ALBUMIN: 3.7 g/dL (ref 3.5–5.0)
ALT: 13 U/L (ref 0–55)
ANION GAP: 7 meq/L (ref 3–11)
AST: 16 U/L (ref 5–34)
Alkaline Phosphatase: 102 U/L (ref 40–150)
BUN: 22.6 mg/dL (ref 7.0–26.0)
CALCIUM: 9.8 mg/dL (ref 8.4–10.4)
CHLORIDE: 105 meq/L (ref 98–109)
CO2: 27 meq/L (ref 22–29)
CREATININE: 1 mg/dL (ref 0.6–1.1)
Glucose: 117 mg/dl (ref 70–140)
POTASSIUM: 3.6 meq/L (ref 3.5–5.1)
Sodium: 139 mEq/L (ref 136–145)
Total Bilirubin: 0.32 mg/dL (ref 0.20–1.20)
Total Protein: 6.9 g/dL (ref 6.4–8.3)

## 2014-06-28 MED ORDER — HEPARIN SOD (PORK) LOCK FLUSH 100 UNIT/ML IV SOLN
500.0000 [IU] | Freq: Once | INTRAVENOUS | Status: AC
  Administered 2014-06-28: 500 [IU] via INTRAVENOUS
  Filled 2014-06-28: qty 5

## 2014-06-28 MED ORDER — SODIUM CHLORIDE 0.9 % IJ SOLN
10.0000 mL | INTRAMUSCULAR | Status: DC | PRN
  Administered 2014-06-28: 10 mL via INTRAVENOUS
  Filled 2014-06-28: qty 10

## 2014-06-28 NOTE — Progress Notes (Signed)
[place]  [phone] [phone]     ID: PHILLIS THACKERAY DOB: 01/16/1946  MR#: [id number]  JQB#:[id number]  Patient Care Team: [name], MD as PCP - General (Family Medicine) Teodoro Spray, MD as Consulting Physician (Cardiology) Armandina Gemma, MD as Consulting Physician (General Surgery) Thea Silversmith, MD as Consulting Physician (Radiation Oncology) Chauncey Cruel, MD as Consulting Physician (Oncology) Armstead Peaks, MD as Consulting Physician  CHIEF COMPLAINT: Metaplastic breast cancer  CURRENT TREATMENT:  Adjuvant radiation therapy  BREAST CANCER HISTORY: From a Dr. Laurelyn Sickle initial intake note 12/21/2013:  "SHAMONICA SCHADT is a 68 y.o. female. Would medical history significant for hypertension gastroesophageal reflux disease. Patient underwent bilateral breast reductions in the 1980s. In February 2015 she had screening mammograms performed that showed a mass in the central right breast. She had ultrasound performed which revealed a 6 x 4 x 6 mm high-grade calcifications. Biopsy of these calcifications revealed invasive ductal carcinoma. On 12/12/2013 patient underwent a lumpectomy without sentinel lymph node biopsy. 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 Tumor was ER +2% PR +2% HER-2/neu negative with an elevated Ki-67 of 84%. Of note patient [could not] undergo a sentinel lymph node biopsy because of previous breast reduction."  The patient was started on adjuvant chemotherapy with cyclophosphamide and docetaxel 01/16/2014, but treatment was interrupted by a perforated diverticulum requiring partial colectomy 02/13/2014.   Her subsequent history is as detailed below  INTERVAL HISTORY: Ranelle returns today for follow up of her breast cancer. After completing her adjuvant chemotherapy here she started radiation approximately 3 weeks ago under Dr. Donella Stade. So far she is tolerating that well, with  mild fatigue and no skin changes.  REVIEW OF SYSTEMS: As far as the fatigue is concerned she "paces herself" but doesn't take a nap during the day. She is sleeping all right at night. She does have a dry cough, very likely due to reflux, since it is worse in the morning and when she lies down in the evening. However she is already on Prilosec. She has no shortness of breath, pleurisy, hemoptysis, or purulent sputum production. Her colostomy continues to work well. She is planning to have it reversed in November. Aside from these issues a detailed review of systems today was noncontributory  PAST MEDICAL HISTORY: Past Medical History  Diagnosis Date  . Hypertension   . Allergy   . Wears hearing aid     right  . GERD (gastroesophageal reflux disease)   . Complication of anesthesia     was hard to intubate with thyroid surgery-99  . Difficult intubation 1999    when had thyroid lobectomy  . Dysrhythmia     irregular reuglar heart beat   . Cancer     breast cancer     PAST SURGICAL HISTORY: Past Surgical History  Procedure Laterality Date  . Thyroid lobectomy  1999    right  . Rotator cuff repair  2006    right shoulder  . Breast reduction surgery Bilateral     1980's  . Abdominal hysterectomy  1999  . Tonsillectomy      as child  . Appendectomy      as child  . Colonoscopy    . Partial mastectomy with needle localization and axillary sentinel lymph node bx Right 12/12/2013    Procedure: PARTIAL MASTECTOMY WITH NEEDLE LOCALIZATION AND AXILLARY SENTINEL LYMPH NODE BX;  Surgeon: Earnstine Regal, MD;  Location: [facility];  Service: General;  Laterality: Right;  . Portacath placement Left 01/02/2014    Procedure: INSERTION PORT-A-CATH;  Surgeon: Earnstine Regal, MD;  Location: [facility];  Service: General;  Laterality: Left;  . Laparotomy N/A 02/11/2014    Procedure: EXPLORATORY LAPAROTOMY, COLON RESECTION, COLOSTOMY;  Surgeon: Shann Medal, MD;  Location: [facility];  Service:  General;  Laterality: N/A;    FAMILY HISTORY Family History  Problem Relation Age of Onset  . Cancer Father     bladder & pancreatic  The patient's father died from pancreatic cancer the age of 50. The patient's mother is alive, age 68, with significant Alzheimer's disease. The patient had one brother, no sisters. There are no first-degree relatives with breast or ovarian cancer.  GYNECOLOGIC HISTORY:  No LMP recorded. Patient is postmenopausal. Menarche age 59. The patient is GX P0. She underwent hysterectomy with bilateral salpingo-oophorectomy in 1999. She used an estrogen patch for several years after that.   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 Her husband, Lendon Collar, works as a Armed forces logistics/support/administrative officer. He has 3 children from a prior marriage, living in [place], [place], and both work. The patient attends a local [employer]    ADVANCED DIRECTIVES: Not in place   HEALTH MAINTENANCE: History  Substance Use Topics  . Smoking status: Never Smoker   . Smokeless tobacco: Never Used  . Alcohol Use: Yes     Comment: seldom     Colonoscopy:  PAP: Status post hysterectomy  Bone density:  Lipid panel:  Allergies  Allergen Reactions  . Codeine Sulfate Nausea And Vomiting  . Tramadol Nausea Only    Nausea   . Hydrocodone Nausea Only  . Oxycodone Nausea Only    Current Outpatient Prescriptions  Medication Sig Dispense Refill  . b complex vitamins tablet Take 1 tablet by mouth daily.      . calcium carbonate (TUMS - DOSED IN MG ELEMENTAL CALCIUM) 500 MG chewable tablet Chew 1 tablet by mouth daily. As needed      . calcium-vitamin D (OSCAL WITH D) 500-200 MG-UNIT per tablet Take 1 tablet by mouth daily.       Marland Kitchen dexamethasone (DECADRON) 4 MG tablet Take 2 tablets (8 mg total) by mouth 2 (two) times daily with a meal. Start the day after chemotherapy for 3 days.  30 tablet  1  . lidocaine-prilocaine (EMLA) cream Apply  topically as needed.  30 g  6  . loratadine (CLARITIN) 10 MG tablet Take 10 mg by mouth daily. lst dose 12/11/13      . omeprazole (PRILOSEC) 20 MG capsule Take 20 mg by mouth 2 (two) times daily before a meal.       . ondansetron (ZOFRAN) 8 MG tablet Take 1 tablet (8 mg total) by mouth 2 (two) times daily. Start the day after chemo for 3 days. Then take as needed for nausea or vomiting.  30 tablet  1  . polyethylene glycol (MIRALAX / GLYCOLAX) packet Take 17 g by mouth daily as needed for mild constipation.      . sennosides-docusate sodium (SENOKOT-S) 8.6-50 MG tablet Take 1 tablet by mouth daily.      Marland Kitchen telmisartan (MICARDIS) 40 MG tablet Take 40 mg by mouth daily.       No current facility-administered medications for this visit.    OBJECTIVE: Middle-aged white woman in no acute distress Filed Vitals:   06/28/14 1626  BP: 125/59  Pulse: 78  Temp: 98.1 F (36.7 C)  Resp: 18     Body mass index is 30.72 kg/(m^2).    ECOG FS:1 - Symptomatic but completely ambulatory  Sclerae unicteric, pupils round and equal Oropharynx clear and moist No cervical or supraclavicular adenopathy Lungs no rales or rhonchi Heart regular rate and rhythm Abd soft, obese, nontender, positive bowel sounds MSK no focal spinal tenderness, no upper extremity lymphedema Neuro: nonfocal, well oriented, positive affect Breasts: The right breast is status post lumpectomy and is currently receiving radiation. There is minimal to no erythema. The cosmetic result is good. There is no evidence of local recurrence. The right axilla is benign. Left breast is unremarkable.   LAB RESULTS:  CMP     Component Value Date/Time   NA 139 06/28/2014 1558   NA 142 02/24/2014 0525   K 3.6 06/28/2014 1558   K 3.9 02/24/2014 0525   CL 105 02/24/2014 0525   CO2 27 06/28/2014 1558   CO2 26 02/24/2014 0525   GLUCOSE 117 06/28/2014 1558   GLUCOSE 95 02/24/2014 0525   BUN 22.6 06/28/2014 1558   BUN 13 02/24/2014 0525   CREATININE 1.0  06/28/2014 1558   CREATININE 2.47* 02/24/2014 0525   CALCIUM 9.8 06/28/2014 1558   CALCIUM 9.1 02/24/2014 0525   PROT 6.9 06/28/2014 1558   PROT 5.5* 02/15/2014 0329   ALBUMIN 3.7 06/28/2014 1558   ALBUMIN 2.2* 02/15/2014 0329   AST 16 06/28/2014 1558   AST 19 02/15/2014 0329   ALT 13 06/28/2014 1558   ALT 21 02/15/2014 0329   ALKPHOS 102 06/28/2014 1558   ALKPHOS 107 02/15/2014 0329   BILITOT 0.32 06/28/2014 1558   BILITOT 0.6 02/15/2014 0329   GFRNONAA 19* 02/24/2014 0525   GFRAA 22* 02/24/2014 0525    I No results found for this basename: SPEP,  UPEP,   kappa and lambda light chains    Lab Results  Component Value Date   WBC 6.9 06/28/2014   NEUTROABS 4.8 06/28/2014   HGB 10.1* 06/28/2014   HCT 31.1* 06/28/2014   MCV 90.4 06/28/2014   PLT 218 06/28/2014      Chemistry      Component Value Date/Time   NA 139 06/28/2014 1558   NA 142 02/24/2014 0525   K 3.6 06/28/2014 1558   K 3.9 02/24/2014 0525   CL 105 02/24/2014 0525   CO2 27 06/28/2014 1558   CO2 26 02/24/2014 0525   BUN 22.6 06/28/2014 1558   BUN 13 02/24/2014 0525   CREATININE 1.0 06/28/2014 1558   CREATININE 2.47* 02/24/2014 0525      Component Value Date/Time   CALCIUM 9.8 06/28/2014 1558   CALCIUM 9.1 02/24/2014 0525   ALKPHOS 102 06/28/2014 1558   ALKPHOS 107 02/15/2014 0329   AST 16 06/28/2014 1558   AST 19 02/15/2014 0329   ALT 13 06/28/2014 1558   ALT 21 02/15/2014 0329   BILITOT 0.32 06/28/2014 1558   BILITOT 0.6 02/15/2014 0329       No results found for this basename: LABCA2    No components found with this basename: LABCA125    No results found for this basename: INR,  in the last 168 hours  Urinalysis    Component Value Date/Time   COLORURINE YELLOW 02/17/2014 1639   APPEARANCEUR CLOUDY* 02/17/2014 1639   LABSPEC 1.016 02/17/2014 1639   PHURINE 5.0 02/17/2014 [place] 02/17/2014 [place] 02/17/2014 1639  BILIRUBINUR NEGATIVE 02/17/2014 [place] 02/17/2014 1639    PROTEINUR 30* 02/17/2014 1639   UROBILINOGEN 1.0 02/17/2014 1639   NITRITE NEGATIVE 02/17/2014 1639   LEUKOCYTESUR NEGATIVE 02/17/2014 1639    STUDIES: No results found.  ASSESSMENT: 68 y.o. [place], [place] woman  (1) status post right breast biopsy 12/01/2013 for an invasive ductal carcinoma, grade 2 or 3, estrogen receptor 2% "positive", progesterone receptor 2% "positive", both with moderate staining intensity, with an MIB-1 of 84%, and no HER-2 amplification  (2) status post right lumpectomy 12/12/2013 ([id number]) for a pT1b pNX invasive ductal carcinoma, metaplastic, high-grade, estrogen and progesterone receptor negative, repeat HER-2 again negative. Because of prior reduction mammoplasty sentinel lymph node sampling was not performed  (3) adjuvant chemotherapy with cyclophosphamide and docetaxel started 60/12/5995, complicated by peripheral neuropathy, and interrupted after 2 cycles because of diverticular perforation requiring partial colectomy (02/13/2014)  (4) completed 2 additional cycles of chemotherapy 05/22/2014, consisting of carboplatin given day 1 and gemcitabine days 1 and 8 of each 21 day cycle, with Neulasta on day 9.  (5) adjuvant radiation to follow chemotherapy; patient intends to receive this in [place]  (6) to consider antiestrogen therapy after radiation  (7) diverticulitis, status post partial colectomy with colostomy placement may 05/18/2014, scheduled for colostomy reversal mid-November  PLAN: Latrelle is tolerating her radiation treatments remarkably well. Of course she still has 3-4 weeks to go. She understands that there will be some skin changes and some worsening fatigue and she knows that the more active she continues to be the better she will be able to tolerate the treatments.  She is also planning to have colostomy reversal sometime in mid to late November.  Today we spent the better part of the visit discussing anti-estrogens. Her breast cancer cells  were minimally positive for estrogen and progesterone. I think as far as this breast can and that is not to say that there would not be some benefit.  The better motivator for anti-estrogens in her case, I think, would be a prevention strategies. After all she has a 1% per year chance of developing a new breast cancer in either breast, and anti-estrogens would cut that risk in half.  It time to discuss this is when she is feeling recovered, and so she is going to see me again in January. At that point we will go over the possible toxicities, side effects and complications of tamoxifen and the aromatase inhibitors and she will make a definite decision which we she wants to go.  Idalee has a good understanding of the overall plan. She agrees with it. She knows the goal of treatment in her case is cure. She will call with any problems that may develop before next visit here.d be minimal.    Chauncey Cruel, MD   06/28/2014 4:56 PM

## 2014-06-28 NOTE — Patient Instructions (Signed)

## 2014-06-29 ENCOUNTER — Ambulatory Visit: Payer: Self-pay | Admitting: Radiation Oncology

## 2014-06-29 ENCOUNTER — Telehealth: Payer: Self-pay | Admitting: Oncology

## 2014-06-29 NOTE — Telephone Encounter (Signed)
s.w. pt and advised on Jan 2016 appt....pt ok and aware °

## 2014-07-05 LAB — CBC CANCER CENTER
Basophil #: 0.1 x10 3/mm (ref 0.0–0.1)
Basophil %: 1.4 %
EOS ABS: 0.3 x10 3/mm (ref 0.0–0.7)
Eosinophil %: 5.2 %
HCT: 33.2 % — AB (ref 35.0–47.0)
HGB: 11 g/dL — AB (ref 12.0–16.0)
Lymphocyte #: 0.8 x10 3/mm — ABNORMAL LOW (ref 1.0–3.6)
Lymphocyte %: 12 %
MCH: 29.7 pg (ref 26.0–34.0)
MCHC: 33.1 g/dL (ref 32.0–36.0)
MCV: 90 fL (ref 80–100)
Monocyte #: 0.6 x10 3/mm (ref 0.2–0.9)
Monocyte %: 8.9 %
Neutrophil #: 4.9 x10 3/mm (ref 1.4–6.5)
Neutrophil %: 72.5 %
PLATELETS: 150 x10 3/mm (ref 150–440)
RBC: 3.71 10*6/uL — ABNORMAL LOW (ref 3.80–5.20)
RDW: 17.5 % — ABNORMAL HIGH (ref 11.5–14.5)
WBC: 6.7 x10 3/mm (ref 3.6–11.0)

## 2014-07-07 ENCOUNTER — Encounter: Payer: Self-pay | Admitting: General Practice

## 2014-07-11 ENCOUNTER — Ambulatory Visit (HOSPITAL_COMMUNITY)
Admission: RE | Admit: 2014-07-11 | Discharge: 2014-07-11 | Disposition: A | Discharge status: Home / Self Care | Payer: Medicare Other | Source: Ambulatory Visit | Attending: Diagnostic Radiology | Admitting: Diagnostic Radiology

## 2014-07-11 DIAGNOSIS — Z9049 Acquired absence of other specified parts of digestive tract: Secondary | ICD-10-CM | POA: Diagnosis not present

## 2014-07-11 DIAGNOSIS — L988 Other specified disorders of the skin and subcutaneous tissue: Secondary | ICD-10-CM | POA: Diagnosis not present

## 2014-07-11 DIAGNOSIS — Z8719 Personal history of other diseases of the digestive system: Secondary | ICD-10-CM | POA: Diagnosis present

## 2014-07-11 DIAGNOSIS — Z933 Colostomy status: Secondary | ICD-10-CM | POA: Insufficient documentation

## 2014-07-11 DIAGNOSIS — K5730 Diverticulosis of large intestine without perforation or abscess without bleeding: Secondary | ICD-10-CM | POA: Insufficient documentation

## 2014-07-12 LAB — CBC CANCER CENTER
BASOS ABS: 0 x10 3/mm (ref 0.0–0.1)
Basophil %: 0.7 %
EOS ABS: 0.2 x10 3/mm (ref 0.0–0.7)
Eosinophil %: 3.9 %
HCT: 34.2 % — ABNORMAL LOW (ref 35.0–47.0)
HGB: 11.3 g/dL — ABNORMAL LOW (ref 12.0–16.0)
LYMPHS PCT: 12.6 %
Lymphocyte #: 0.8 x10 3/mm — ABNORMAL LOW (ref 1.0–3.6)
MCH: 29.9 pg (ref 26.0–34.0)
MCHC: 33.2 g/dL (ref 32.0–36.0)
MCV: 90 fL (ref 80–100)
Monocyte #: 0.6 x10 3/mm (ref 0.2–0.9)
Monocyte %: 10.1 %
NEUTROS ABS: 4.4 x10 3/mm (ref 1.4–6.5)
Neutrophil %: 72.7 %
PLATELETS: 148 x10 3/mm — AB (ref 150–440)
RBC: 3.79 10*6/uL — ABNORMAL LOW (ref 3.80–5.20)
RDW: 16.9 % — ABNORMAL HIGH (ref 11.5–14.5)
WBC: 6.1 x10 3/mm (ref 3.6–11.0)

## 2014-07-19 DIAGNOSIS — Z87898 Personal history of other specified conditions: Secondary | ICD-10-CM | POA: Insufficient documentation

## 2014-07-19 DIAGNOSIS — C50919 Malignant neoplasm of unspecified site of unspecified female breast: Secondary | ICD-10-CM | POA: Insufficient documentation

## 2014-07-19 DIAGNOSIS — C50911 Malignant neoplasm of unspecified site of right female breast: Secondary | ICD-10-CM | POA: Insufficient documentation

## 2014-07-19 DIAGNOSIS — Z8719 Personal history of other diseases of the digestive system: Secondary | ICD-10-CM | POA: Insufficient documentation

## 2014-07-19 LAB — CBC CANCER CENTER
BASOS ABS: 0 x10 3/mm (ref 0.0–0.1)
Basophil %: 0.6 %
EOS ABS: 0.2 x10 3/mm (ref 0.0–0.7)
Eosinophil %: 4.5 %
HCT: 33.9 % — AB (ref 35.0–47.0)
HGB: 11.1 g/dL — ABNORMAL LOW (ref 12.0–16.0)
LYMPHS PCT: 14 %
Lymphocyte #: 0.7 x10 3/mm — ABNORMAL LOW (ref 1.0–3.6)
MCH: 30.1 pg (ref 26.0–34.0)
MCHC: 32.6 g/dL (ref 32.0–36.0)
MCV: 92 fL (ref 80–100)
MONO ABS: 0.4 x10 3/mm (ref 0.2–0.9)
MONOS PCT: 7.9 %
NEUTROS ABS: 3.9 x10 3/mm (ref 1.4–6.5)
Neutrophil %: 73 %
Platelet: 184 x10 3/mm (ref 150–440)
RBC: 3.68 10*6/uL — AB (ref 3.80–5.20)
RDW: 16.3 % — AB (ref 11.5–14.5)
WBC: 5.3 x10 3/mm (ref 3.6–11.0)

## 2014-07-26 LAB — CBC CANCER CENTER
Basophil #: 0 x10 3/mm (ref 0.0–0.1)
Basophil %: 0.6 %
Eosinophil #: 0.2 x10 3/mm (ref 0.0–0.7)
Eosinophil %: 4.5 %
HCT: 32.5 % — ABNORMAL LOW (ref 35.0–47.0)
HGB: 10.6 g/dL — ABNORMAL LOW (ref 12.0–16.0)
Lymphocyte #: 0.6 x10 3/mm — ABNORMAL LOW (ref 1.0–3.6)
Lymphocyte %: 13.9 %
MCH: 30.2 pg (ref 26.0–34.0)
MCHC: 32.7 g/dL (ref 32.0–36.0)
MCV: 92 fL (ref 80–100)
MONO ABS: 0.4 x10 3/mm (ref 0.2–0.9)
Monocyte %: 8.9 %
NEUTROS PCT: 72.1 %
Neutrophil #: 3.2 x10 3/mm (ref 1.4–6.5)
PLATELETS: 187 x10 3/mm (ref 150–440)
RBC: 3.52 10*6/uL — ABNORMAL LOW (ref 3.80–5.20)
RDW: 16 % — ABNORMAL HIGH (ref 11.5–14.5)
WBC: 4.4 x10 3/mm (ref 3.6–11.0)

## 2014-07-30 ENCOUNTER — Ambulatory Visit: Payer: Self-pay | Admitting: Radiation Oncology

## 2014-08-21 NOTE — H&P (Signed)
Martha Evans 06/09/2014 12:09 PM Location: [facility] Patient #: [id number] DOB: Oct 17, 1945 Married / Language: English / Race: White Female MRN: [id number]  ASSESSMENT AND PLAN: 1. Exploratory laparotomy with removal of left colon and splenic flexure (mobilization of the splenic flexure), Hartmann pouch, and left transverse colon end colostomy - 02/11/2014 - D. Haydn Hutsell For perforated diverticular disease. Hospitalized from 02/10/2014 to 02/24/2014 She will see me back in 8 to 10 weeks. Plan reversal of colostomy - after chemo, after radiation tx, and at least 6 months from the day of surgery.\  She has had a BE.  Now plan colonoscopy.  1A. Open wound with VAC This is healing.  2. Right breast cancer, T1, Nx  Invasive ductal ca (metaplastic), 0.8 cm, Grade II-III, ER 0%, PR 0%, Ki-67 - 84%, HER-2/neu negative.  Has had 2 of 4 cycles of Docetaxel and Cyclophosphamide -  Oncolopgy - Dr.Magrinat Marland KitchenMendel Ryder Cornetto.  Right lumpectomy - 12/13/2013 - T. Gerkin  Sees Dr. Pablo Ledger for rad tx.  She is to restart her chemotx next Monday, 04/24/2014, for two cycles. Then radiation tx.  3. HTN  4. GERD  5. Left subclavian power port - April 2015 - Gerkin  HISTORY OF PRESENT ILLNESS: Chief Complaint  Patient presents with  . Routine Post Op    colostomy    Martha Evans is a 68 y.o. (DOB: March 21, 1946) white female who is a patient of [name], MD and comes to me today for follow up of a perforated colon.  The patient is a 68 year old female who presents with diverticulitis. She comes by herself. Her PCP is Dr. Alan Mulder [facility]). She is seeing Dr. Jana Hakim for med onc. She is seeing Dr. Ginger Carne for rad onc.  The patient had an exploratory laparotomy with removal of left colon and splenic flexure, Hartmann  pouch, left transverse colon and colostomy by Dr. Alphonsa Overall 02/11/2014 for perf diverticulitis. The plan is to reverse her colostomy after she completed chemotherapy, after radiation therapy, and at least 6 months from the day of surgery.  She had an open wound treated with a VAC. She has right breast cancer, T1, Nx. she had an invasive ductal carcinoma, 0.8 cm, grade 2-3, ER 0% PR 0%, Ki-67 - 84%, her 2 neu negative. Note: Dr. Harlow Asa did her breast sugery and thyroid surgery. I have repeatedly asked her about him following her colon, but she has requested that I take care of it. She starts radiation tx with Dr. Donella Stade soon, to be finished Oct 25.  Past Medical History  Diagnosis Date  . Hypertension   . Allergy   . Wears hearing aid     right  . GERD (gastroesophageal reflux disease)   . Complication of anesthesia     was hard to intubate with thyroid surgery-99  . Difficult intubation 1999    when had thyroid lobectomy  . Dysrhythmia     irregular reuglar heart beat   . Cancer     breast cancer    Past Surgical History [name], LPN; 5/63/1497 0:26 PM) Breast Biopsy Right. Breast Mass; Local Excision Right. Colon Polyp Removal - Colonoscopy Colon Removal - Partial Hysterectomy (not due to cancer) - Complete Mammoplasty; Reduction Bilateral. Resection of Small Bowel Shoulder Surgery Right. Thyroid Surgery Tonsillectomy   SOCIAL HISTORY: Married. Her husband cannot look at her wounds.  PHYSICAL EXAM: BP 128/72  Pulse 84  Temp(Src) 98 F (36.7 C)  Resp 18  Ht 5'  5" (1.651 m)  Wt 178 lb (80.74 kg)  BMI 29.62 kg/m2  General: WN WF who is alert and generally healthy appearing. She has a little bit of hair coming in. HEENT: Normal. Pupils equal.  Abdomen: Soft. Ostomy in LUQ. Wound looks good. It is almost healed.  DATA REVIEWED: Epic notes.  Alphonsa Overall, MD, [facility],  [address] [place], [place] Phone: [phone]: [phone]

## 2014-08-22 ENCOUNTER — Encounter (HOSPITAL_COMMUNITY): Payer: Self-pay

## 2014-08-22 ENCOUNTER — Ambulatory Visit (HOSPITAL_COMMUNITY)
Admission: RE | Admit: 2014-08-22 | Discharge: 2014-08-22 | Disposition: A | Discharge status: Home / Self Care | Payer: Medicare Other | Source: Ambulatory Visit | Attending: Surgery | Admitting: Surgery

## 2014-08-22 ENCOUNTER — Encounter (HOSPITAL_COMMUNITY)
Admission: RE | Disposition: A | Discharge status: Home / Self Care | Payer: Self-pay | Source: Ambulatory Visit | Attending: Surgery

## 2014-08-22 DIAGNOSIS — K219 Gastro-esophageal reflux disease without esophagitis: Secondary | ICD-10-CM | POA: Diagnosis not present

## 2014-08-22 DIAGNOSIS — Z933 Colostomy status: Secondary | ICD-10-CM | POA: Diagnosis not present

## 2014-08-22 DIAGNOSIS — K5780 Diverticulitis of intestine, part unspecified, with perforation and abscess without bleeding: Secondary | ICD-10-CM | POA: Diagnosis present

## 2014-08-22 DIAGNOSIS — I10 Essential (primary) hypertension: Secondary | ICD-10-CM | POA: Diagnosis not present

## 2014-08-22 DIAGNOSIS — K529 Noninfective gastroenteritis and colitis, unspecified: Secondary | ICD-10-CM | POA: Diagnosis not present

## 2014-08-22 DIAGNOSIS — Z853 Personal history of malignant neoplasm of breast: Secondary | ICD-10-CM | POA: Insufficient documentation

## 2014-08-22 HISTORY — PX: COLONOSCOPY: SHX5424

## 2014-08-22 SURGERY — COLONOSCOPY
Anesthesia: Moderate Sedation

## 2014-08-22 MED ORDER — FENTANYL CITRATE 0.05 MG/ML IJ SOLN
INTRAMUSCULAR | Status: AC
  Filled 2014-08-22: qty 2

## 2014-08-22 MED ORDER — MIDAZOLAM HCL 5 MG/5ML IJ SOLN
INTRAMUSCULAR | Status: DC | PRN
  Administered 2014-08-22 (×2): 2 mg via INTRAVENOUS

## 2014-08-22 MED ORDER — MIDAZOLAM HCL 10 MG/2ML IJ SOLN
INTRAMUSCULAR | Status: AC
  Filled 2014-08-22: qty 2

## 2014-08-22 MED ORDER — FENTANYL CITRATE 0.05 MG/ML IJ SOLN
INTRAMUSCULAR | Status: DC | PRN
  Administered 2014-08-22 (×2): 25 ug via INTRAVENOUS

## 2014-08-22 NOTE — Interval H&P Note (Signed)
History and Physical Interval Note:  08/22/2014 8:03 AM  Martha Evans  has presented today for surgery, with the diagnosis of DIVERTICULITIS  The various methods of treatment have been discussed with the patient and family.   Her husband is with her today.  After consideration of risks, benefits and other options for treatment, the patient has consented to  Procedure(s): COLONOSCOPY (N/A) as a surgical intervention .  The patient's history has been reviewed, patient examined, no change in status, stable for surgery.  I have reviewed the patient's chart and labs.  Questions were answered to the patient's satisfaction.     Ellaina Schuler H

## 2014-08-22 NOTE — Op Note (Signed)
08/22/2014  8:40 AM  PATIENT:  Martha Evans, 68 y.o., female, MRN: [id number]  PREOP DIAGNOSIS:  DIVERTICULITIS, perforation.  End left transverse colostomy.  POSTOP DIAGNOSIS:   DIVERTICULITIS, perforation.  End left transverse colostomy.  Diversion colitis.    PROCEDURE:   Procedure(s):  COLONOSCOPY - through rectum and ostomy.  SURGEON:   Alphonsa Overall, M.D.   ANESTHESIA:   Moderate Sedation.  4 mg Versed, 50 mcg Fentanyl   SPECIMEN:   None  INDICATIONS FOR PROCEDURE:  Martha Evans is a 68 y.o. (DOB: 1946/05/20) white  female whose primary care physician is [name], MD and comes for colonoscopy.  She had a exploratory lap with removal of left colon and splenic flexure for perforated colon on 02/11/2014.  We are now preparing her for colostomy reversal.  She is undergoing a colonoscopy to look at the remaining colon.  She has not had a colonoscopy in over 5 years.   The indications and risks of colonoscopy were explained to the patient.  The risks include, but are not limited to, perforation of the bowel and bleeding.  OPERATIVE NOTE:  The patient was taken to room # 3 in the [facility] endoscopy suite.  The patient was monitored with pulse oximetry, blood pressure cuff, and cardiac monitor.  The had 2 liters of nasal O2 during the procedure.  A time out was held and the checklist reviewed.   The patient was sedated with 50 mcgm of Fentanyl and 4 mgm of Versed.   A digital rectal exam was done at the beginning of the procedure..  The anus and rectum were unremarkable.     The flexible Pentax colonoscope was passed up the rectum without difficulty.  The scope was advanced to about 35 to 40 cm where I could not advance it any further.  I do not think that I got to the end of the Hartmann's pouch.  She had evidence of diversion colitis.  There was slough in the defunctionalized colon.   I then laid her on her back an passed the colonoscope through the left upper quadrant  colostomy.  The ileocecal valve was identified. I advanced the scope about 70 cm.  The colonic prep was okay.   The right colon and transverse colon were unremarkable.   The scope was withdrawn into the rectum and retroflexed.  The rectum was unremarkable.  Photos were taken during the procedure and placed in the chart.   The patient was taken to the recovery area of the [facility] endoscopy in good condition.  Her husband was with the patient and I explained the findings of the procedure.   The patient's next colonoscopy should be in 10 years.  Alphonsa Overall, MD, [facility] Pager: [phone] Office phone:  [phone]

## 2014-08-22 NOTE — Discharge Instructions (Signed)
Colonoscopy, Care After °These instructions give you information on caring for yourself after your procedure. Your doctor may also give you more specific instructions. Call your doctor if you have any problems or questions after your procedure. °HOME CARE °· Do not drive for 24 hours. °· Do not sign important papers or use machinery for 24 hours. °· You may shower. °· You may go back to your usual activities, but go slower for the first 24 hours. °· Take rest breaks often during the first 24 hours. °· Walk around or use warm packs on your belly (abdomen) if you have belly cramping or gas. °· Drink enough fluids to keep your pee (urine) clear or pale yellow. °· Resume your normal diet. Avoid heavy or fried foods. °· Avoid drinking alcohol for 24 hours or as told by your doctor. °· Only take medicines as told by your doctor. °If a tissue sample (biopsy) was taken during the procedure:  °· Do not take aspirin or blood thinners for 7 days, or as told by your doctor. °· Do not drink alcohol for 7 days, or as told by your doctor. °· Eat soft foods for the first 24 hours. °GET HELP IF: °You still have a small amount of blood in your poop (stool) 2-3 days after the procedure. °GET HELP RIGHT AWAY IF: °· You have more than a small amount of blood in your poop. °· You see clumps of tissue (blood clots) in your poop. °· Your belly is puffy (swollen). °· You feel sick to your stomach (nauseous) or throw up (vomit). °· You have a fever. °· You have belly pain that gets worse and medicine does not help. °MAKE SURE YOU: °· Understand these instructions. °· Will watch your condition. °· Will get help right away if you are not doing well or get worse. °Document Released: 10/18/2010 Document Revised: 09/20/2013 Document Reviewed: 05/23/2013 °ExitCare® Patient Information ©2015 [employer]. This information is not intended to replace advice given to you by your health care provider. Make sure you discuss any questions you have with  your health care provider. ° °

## 2014-08-23 ENCOUNTER — Encounter (HOSPITAL_COMMUNITY): Payer: Self-pay | Admitting: Surgery

## 2014-08-29 ENCOUNTER — Ambulatory Visit: Payer: Self-pay | Admitting: Radiation Oncology

## 2014-09-05 NOTE — Progress Notes (Signed)
Please put orders in Epic surgery 09-15-14 pre op 09-11-14 Thanks 

## 2014-09-06 ENCOUNTER — Ambulatory Visit (INDEPENDENT_AMBULATORY_CARE_PROVIDER_SITE_OTHER): Payer: Self-pay | Admitting: Surgery

## 2014-09-06 NOTE — H&P (Signed)
Martha Evans. Rigdon 06/09/2014 12:09 PM Location: [facility] Patient #: [id number] DOB: 05/15/1946 Married / Language: English / Race: White Female  History of Present Illness  Patient words: Long term f/u colostomy take down.  The patient is a 68 year old female who presents with diverticulitis. She comes by herself. Her PCP is Dr. Alan Mulder [facility]). She is seeing Dr. Jana Hakim for med onc. She is seeing Dr. Ginger Carne for rad onc.  The patient had an exploratory laparotomy with removal of left colon and splenic flexure, Hartmann pouch, left transverse colon and colostomy by Dr. Alphonsa Overall 02/11/2014 for perf diverticulitis. The plan is to reverse her colostomy after she completed chemotherapy, after radiation therapy, and at least 6 months from the day of surgery.  She had an open wound treated with a VAC. She has right breast cancer, T1, Nx. she had an invasive ductal carcinoma, 0.8 cm, grade 2-3, ER 0% PR 0%, Ki-67 - 84%, her 2 neu negative. Note: Dr. Harlow Asa did her breast sugery and thyroid surgery. I have repeatedly asked her about him following her colon, but she has requested that I take care of it. She starts radiation tx with Dr. Donella Stade soon, to be finished Oct 25.  BE - 07/11/2014 - 1. Extensive diverticulosis of the distal descending and sigmoid portions of the mucous fistula. 2. Scattered diverticula in the transverse colon.  Colonoscopy - 07/22/2014 - rectal segment 35 - 40 cm  She is married. Her husband cannot look at her wounds.  Other Problems Martha Jordan, LPN; 02/24/4131 4:40 PM) HISTORY OF DIVERTICULITIS OF COLON (V12.79  Z87.19) Breast Cancer Gastroesophageal Reflux Disease High blood pressure Thyroid Disease  Past Surgical History Martha Jordan, LPN; 10/01/7251 6:64 PM) Breast Biopsy Right. Breast Mass; Local Excision Right. Colon Polyp Removal - Colonoscopy Colon Removal - Partial Hysterectomy (not due to cancer) -  Complete Mammoplasty; Reduction Bilateral. Resection of Small Bowel Shoulder Surgery Right. Thyroid Surgery Tonsillectomy  Diagnostic Studies History Martha Jordan, LPN; 12/30/4740 5:95 PM) Colonoscopy 5-10 years ago Mammogram within last year Pap Smear 1-5 years ago  Social History Martha Jordan, LPN; 6/38/7564 3:32 PM) Alcohol use Occasional alcohol use. Caffeine use Coffee, Tea. No drug use Tobacco use Never smoker.  Family History Martha Jordan, LPN; 9/51/8841 6:60 PM) Diabetes Mellitus Father. Kidney Disease Father. Malignant Neoplasm Of Pancreas Father. Migraine Headache Mother.  Pregnancy / Birth History Martha Jordan, LPN; 03/28/1600 0:93 PM) Age at menarche 83 years. Age of menopause 63-55 Gravida 0 Para 0  Vitals Martha Raring Kendrick LPN; 2/35/5732 20:25 PM) 06/09/2014 12:41 PM Height: 64in Temp.: 66F  Resp.: 18 (Unlabored)  BP: 132/84 (Sitting, Left Arm, Standard)   Physical Exam Martha Evans H. Lucia Gaskins MD; 06/09/2014 12:38 PM) The physical exam findings are as follows: Note:General - WN WF. She has more of her hair coming in. She has gray hair and I think will turn it blond when it gets long enough. Lungs - Clear Heart - RRR Abdomen - Well healed midline incision. LUQ ostomy looks good.  Assessment & Plan  1  HISTORY OF DIVERTICULITIS OF COLON (V12.79  Z87.19)  Impression: Doing well.  For reversal of colostomy.  Discussed the surgery with the patient.  Will try to do as much as possible laparoscopically.  The risks of surgery include, but are not limited to, bleeding, infection, leak from the colon, and recurrent diverticulitis.  She has a bowel prep.   2.  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

## 2014-09-08 NOTE — Anesthesia Preprocedure Evaluation (Addendum)
Anesthesia Evaluation  Patient identified by MRN, date of birth, ID band Patient awake    Reviewed: Allergy & Precautions, H&P , NPO status , Patient's Chart, lab work & pertinent test results  Airway Mallampati: II  TM Distance: >3 FB Neck ROM: full    Dental  (+) Caps, Dental Advisory Given All upper front are capped:   Pulmonary neg pulmonary ROS,  breath sounds clear to auscultation  Pulmonary exam normal       Cardiovascular Exercise Tolerance: Good hypertension, negative cardio ROS  Rhythm:regular Rate:Normal     Neuro/Psych negative neurological ROS  negative psych ROS   GI/Hepatic negative GI ROS, Neg liver ROS,   Endo/Other  negative endocrine ROS  Renal/GU negative Renal ROS  negative genitourinary   Musculoskeletal   Abdominal   Peds  Hematology negative hematology ROS (+)   Anesthesia Other Findings   Reproductive/Obstetrics negative OB ROS                            Anesthesia Physical Anesthesia Plan  ASA: III  Anesthesia Plan: General   Post-op Pain Management:    Induction: Intravenous  Airway Management Planned: Oral ETT  Additional Equipment:   Intra-op Plan:   Post-operative Plan: Extubation in OR  Informed Consent: I have reviewed the patients History and Physical, chart, labs and discussed the procedure including the risks, benefits and alternatives for the proposed anesthesia with the patient or authorized representative who has indicated his/her understanding and acceptance.   Dental Advisory Given  Plan Discussed with: CRNA and Surgeon  Anesthesia Plan Comments:        Anesthesia Quick Evaluation                                  Anesthesia Evaluation  Patient identified by MRN, date of birth, ID band Patient awake    Reviewed: Allergy & Precautions, H&P , NPO status , Patient's Chart, lab work & pertinent test results  History of  Anesthesia Complications (+) DIFFICULT AIRWAY  Airway Mallampati: III TM Distance: >3 FB Neck ROM: full    Dental  (+) Caps, Dental Advisory Given All upper front are capped:   Pulmonary neg pulmonary ROS,  breath sounds clear to auscultation  Pulmonary exam normal       Cardiovascular Exercise Tolerance: Good hypertension, Pt. on medications negative cardio ROS  Rhythm:regular Rate:Normal  Irregular heart beat   Neuro/Psych negative neurological ROS  negative psych ROS   GI/Hepatic negative GI ROS, Neg liver ROS, GERD-  Medicated and Controlled,  Endo/Other  negative endocrine ROS  Renal/GU negative Renal ROS  negative genitourinary   Musculoskeletal   Abdominal   Peds  Hematology negative hematology ROS (+) anemia , hgb 10.2   Anesthesia Other Findings   Reproductive/Obstetrics negative OB ROS                          Anesthesia Physical Anesthesia Plan  ASA: III and emergent  Anesthesia Plan: General   Post-op Pain Management:    Induction: Intravenous, Cricoid pressure planned and Rapid sequence  Airway Management Planned: Oral ETT  Additional Equipment:   Intra-op Plan:   Post-operative Plan: Extubation in OR  Informed Consent: I have reviewed the patients History and Physical, chart, labs and discussed the procedure including the risks, benefits and alternatives for the proposed  anesthesia with the patient or authorized representative who has indicated his/her understanding and acceptance.   Dental Advisory Given  Plan Discussed with: CRNA and Surgeon  Anesthesia Plan Comments:         Anesthesia Quick Evaluation

## 2014-09-09 ENCOUNTER — Telehealth: Payer: Self-pay | Admitting: Oncology

## 2014-09-09 NOTE — Telephone Encounter (Signed)
lvm for pt regarding to [facility]...appt moved from 1.13 to 1.15 per MD request.....mailed pt appt sched adn letter

## 2014-09-11 ENCOUNTER — Encounter (HOSPITAL_COMMUNITY)
Admission: RE | Admit: 2014-09-11 | Discharge: 2014-09-11 | Disposition: A | Discharge status: Home / Self Care | Payer: Medicare Other | Source: Ambulatory Visit | Attending: Surgery | Admitting: Surgery

## 2014-09-11 ENCOUNTER — Encounter (HOSPITAL_COMMUNITY): Payer: Self-pay

## 2014-09-11 DIAGNOSIS — Z01812 Encounter for preprocedural laboratory examination: Secondary | ICD-10-CM | POA: Diagnosis not present

## 2014-09-11 LAB — CBC
HEMATOCRIT: 35.2 % — AB (ref 36.0–46.0)
Hemoglobin: 11.2 g/dL — ABNORMAL LOW (ref 12.0–15.0)
MCH: 30.1 pg (ref 26.0–34.0)
MCHC: 31.8 g/dL (ref 30.0–36.0)
MCV: 94.6 fL (ref 78.0–100.0)
Platelets: 227 10*3/uL (ref 150–400)
RBC: 3.72 MIL/uL — ABNORMAL LOW (ref 3.87–5.11)
RDW: 12.7 % (ref 11.5–15.5)
WBC: 5 10*3/uL (ref 4.0–10.5)

## 2014-09-11 LAB — BASIC METABOLIC PANEL
ANION GAP: 13 (ref 5–15)
BUN: 23 mg/dL (ref 6–23)
CHLORIDE: 105 meq/L (ref 96–112)
CO2: 25 mEq/L (ref 19–32)
Calcium: 9.5 mg/dL (ref 8.4–10.5)
Creatinine, Ser: 0.96 mg/dL (ref 0.50–1.10)
GFR calc non Af Amer: 60 mL/min — ABNORMAL LOW (ref >90)
GFR, EST AFRICAN AMERICAN: 69 mL/min — AB (ref >90)
Glucose, Bld: 112 mg/dL — ABNORMAL HIGH (ref 70–99)
POTASSIUM: 4.1 meq/L (ref 3.7–5.3)
Sodium: 143 mEq/L (ref 137–147)

## 2014-09-11 LAB — HEMOGLOBIN A1C
Hgb A1c MFr Bld: 5.8 % — ABNORMAL HIGH (ref <5.7)
MEAN PLASMA GLUCOSE: 120 mg/dL — AB (ref <117)

## 2014-09-11 NOTE — Progress Notes (Signed)
01/31/2014-EKG from [facility] on chart. 06/30/14-Pre-operative clearance on chart from Dr. Kandice Robinsons.

## 2014-09-11 NOTE — Patient Instructions (Addendum)
Martha Evans  09/11/2014   Your procedure is scheduled on:  Friday 09/15/2014  Report to [facility]  Entrance and follow signs to               [facility] at 0950 AM.  Call this number if you have problems the morning of surgery [phone]   Remember:  Follow bowel prep instructions from dr. Pollie Evans office as directed!  Do not eat food or drink liquids :After Midnight.     Take these medicines the morning of surgery with A SIP OF WATER: Prilosec                               You may not have any metal on your body including hair pins and              piercings  Do not wear jewelry, make-up, lotions, powders or perfumes.             Do not wear nail polish.  Do not shave  48 hours prior to surgery.              Men may shave face and neck.   Do not bring valuables to the hospital. [place].  Contacts, dentures or bridgework may not be worn into surgery.  Leave suitcase in the car. After surgery it may be brought to your room.     Patients discharged the day of surgery will not be allowed to drive home.  Name and phone number of your driver:  Special Instructions: N/A              Please read over the following fact sheets you were given: _____________________________________________________________________             [facility] - Preparing for Surgery Before surgery, you can play an important role.  Because skin is not sterile, your skin needs to be as free of germs as possible.  You can reduce the number of germs on your skin by washing with CHG (chlorahexidine gluconate) soap before surgery.  CHG is an antiseptic cleaner which kills germs and bonds with the skin to continue killing germs even after washing. Please DO NOT use if you have an allergy to CHG or antibacterial soaps.  If your skin becomes reddened/irritated stop using the CHG and inform your nurse when you arrive at Short Stay. Do  not shave (including legs and underarms) for at least 48 hours prior to the first CHG shower.  You may shave your face/neck. Please follow these instructions carefully:  1.  Shower with CHG Soap the night before surgery and the  morning of Surgery.  2.  If you choose to wash your hair, wash your hair first as usual with your  normal  shampoo.  3.  After you shampoo, rinse your hair and body thoroughly to remove the  shampoo.                           4.  Use CHG as you would any other liquid soap.  You can apply chg directly  to the skin and wash  Gently with a scrungie or clean washcloth.  5.  Apply the CHG Soap to your body ONLY FROM THE NECK DOWN.   Do not use on face/ open                           Wound or open sores. Avoid contact with eyes, ears mouth and genitals (private parts).                       Wash face,  Genitals (private parts) with your normal soap.             6.  Wash thoroughly, paying special attention to the area where your surgery  will be performed.  7.  Thoroughly rinse your body with warm water from the neck down.  8.  DO NOT shower/wash with your normal soap after using and rinsing off  the CHG Soap.                9.  Pat yourself dry with a clean towel.            10.  Wear clean pajamas.            11.  Place clean sheets on your bed the night of your first shower and do not  sleep with pets. Day of Surgery : Do not apply any lotions/deodorants the morning of surgery.  Please wear clean clothes to the hospital/surgery center.  FAILURE TO FOLLOW THESE INSTRUCTIONS MAY RESULT IN THE CANCELLATION OF YOUR SURGERY PATIENT SIGNATURE_________________________________  NURSE SIGNATURE__________________________________  ________________________________________________________________________   Martha Evans  An incentive spirometer is a tool that can help keep your lungs clear and active. This tool measures how well you are filling your  lungs with each breath. Taking long deep breaths may help reverse or decrease the chance of developing breathing (pulmonary) problems (especially infection) following:  A long period of time when you are unable to move or be active. BEFORE THE PROCEDURE   If the spirometer includes an indicator to show your best effort, your nurse or respiratory therapist will set it to a desired goal.  If possible, sit up straight or lean slightly forward. Try not to slouch.  Hold the incentive spirometer in an upright position. INSTRUCTIONS FOR USE   Sit on the edge of your bed if possible, or sit up as far as you can in bed or on a chair.  Hold the incentive spirometer in an upright position.  Breathe out normally.  Place the mouthpiece in your mouth and seal your lips tightly around it.  Breathe in slowly and as deeply as possible, raising the piston or the ball toward the top of the column.  Hold your breath for 3-5 seconds or for as long as possible. Allow the piston or ball to fall to the bottom of the column.  Remove the mouthpiece from your mouth and breathe out normally.  Rest for a few seconds and repeat Steps 1 through 7 at least 10 times every 1-2 hours when you are awake. Take your time and take a few normal breaths between deep breaths.  The spirometer may include an indicator to show your best effort. Use the indicator as a goal to work toward during each repetition.  After each set of 10 deep breaths, practice coughing to be sure your lungs are clear. If you have an incision (the cut made at the time of surgery),  support your incision when coughing by placing a pillow or rolled up towels firmly against it. Once you are able to get out of bed, walk around indoors and cough well. You may stop using the incentive spirometer when instructed by your caregiver.  RISKS AND COMPLICATIONS  Take your time so you do not get dizzy or light-headed.  If you are in pain, you may need to take or  ask for pain medication before doing incentive spirometry. It is harder to take a deep breath if you are having pain. AFTER USE  Rest and breathe slowly and easily.  It can be helpful to keep track of a log of your progress. Your caregiver can provide you with a simple table to help with this. If you are using the spirometer at home, follow these instructions: [place] IF:   You are having difficultly using the spirometer.  You have trouble using the spirometer as often as instructed.  Your pain medication is not giving enough relief while using the spirometer.  You develop fever of 100.5 F (38.1 C) or higher. SEEK IMMEDIATE MEDICAL CARE IF:   You cough up bloody sputum that had not been present before.  You develop fever of 102 F (38.9 C) or greater.  You develop worsening pain at or near the incision site. MAKE SURE YOU:   Understand these instructions.  Will watch your condition.  Will get help right away if you are not doing well or get worse. Document Released: 01/26/2007 Document Revised: 12/08/2011 Document Reviewed: 03/29/2007 ExitCare Patient Information 2014 ExitCare, [place].   ________________________________________________________________________  WHAT IS A BLOOD TRANSFUSION? Blood Transfusion Information  A transfusion is the replacement of blood or some of its parts. Blood is made up of multiple cells which provide different functions.  Red blood cells carry oxygen and are used for blood loss replacement.  White blood cells fight against infection.  Platelets control bleeding.  Plasma helps clot blood.  Other blood products are available for specialized needs, such as hemophilia or other clotting disorders. BEFORE THE TRANSFUSION  Who gives blood for transfusions?   Healthy volunteers who are fully evaluated to make sure their blood is safe. This is blood bank blood. Transfusion therapy is the safest it has ever been in the practice of  medicine. Before blood is taken from a donor, a complete history is taken to make sure that person has no history of diseases nor engages in risky social behavior (examples are intravenous drug use or sexual activity with multiple partners). The donor's travel history is screened to minimize risk of transmitting infections, such as malaria. The donated blood is tested for signs of infectious diseases, such as HIV and hepatitis. The blood is then tested to be sure it is compatible with you in order to minimize the chance of a transfusion reaction. If you or a relative donates blood, this is often done in anticipation of surgery and is not appropriate for emergency situations. It takes many days to process the donated blood. RISKS AND COMPLICATIONS Although transfusion therapy is very safe and saves many lives, the main dangers of transfusion include:   Getting an infectious disease.  Developing a transfusion reaction. This is an allergic reaction to something in the blood you were given. Every precaution is taken to prevent this. The decision to have a blood transfusion has been considered carefully by your caregiver before blood is given. Blood is not given unless the benefits outweigh the risks. AFTER THE TRANSFUSION  Right after receiving a blood transfusion, you will usually feel much better and more energetic. This is especially true if your red blood cells have gotten low (anemic). The transfusion raises the level of the red blood cells which carry oxygen, and this usually causes an energy increase.  The nurse administering the transfusion will monitor you carefully for complications. HOME CARE INSTRUCTIONS  No special instructions are needed after a transfusion. You may find your energy is better. Speak with your caregiver about any limitations on activity for underlying diseases you may have. SEEK MEDICAL CARE IF:   Your condition is not improving after your transfusion.  You develop  redness or irritation at the intravenous (IV) site. SEEK IMMEDIATE MEDICAL CARE IF:  Any of the following symptoms occur over the next 12 hours:  Shaking chills.  You have a temperature by mouth above 102 F (38.9 C), not controlled by medicine.  Chest, back, or muscle pain.  People around you feel you are not acting correctly or are confused.  Shortness of breath or difficulty breathing.  Dizziness and fainting.  You get a rash or develop hives.  You have a decrease in urine output.  Your urine turns a dark color or changes to pink, red, or brown. Any of the following symptoms occur over the next 10 days:  You have a temperature by mouth above 102 F (38.9 C), not controlled by medicine.  Shortness of breath.  Weakness after normal activity.  The white part of the eye turns yellow (jaundice).  You have a decrease in the amount of urine or are urinating less often.  Your urine turns a dark color or changes to pink, red, or brown. Document Released: 09/12/2000 Document Revised: 12/08/2011 Document Reviewed: 05/01/2008 ExitCare Patient Information 2014 ExitCare, [place].  _______________________________________________________________________   CLEAR LIQUID DIET   Foods Allowed                                                                     Foods Excluded  Coffee and tea, regular and decaf                             liquids that you cannot  Plain Jell-O in any flavor                                             see through such as: Fruit ices (not with fruit pulp)                                     milk, soups, orange juice  Iced Popsicles                                    All solid food Carbonated beverages, regular and diet  Cranberry, grape and apple juices Sports drinks like Gatorade Lightly seasoned clear broth or consume(fat free) Sugar, honey syrup  Sample Menu Breakfast                                Lunch                                      Supper Cranberry juice                    Beef broth                            Chicken broth Jell-O                                     Grape juice                           Apple juice Coffee or tea                        Jell-O                                      Popsicle                                                Coffee or tea                        Coffee or tea  _____________________________________________________________________

## 2014-09-11 NOTE — Progress Notes (Signed)
   09/11/14 1348  OBSTRUCTIVE SLEEP APNEA  Have you ever been diagnosed with sleep apnea through a sleep study? No  Do you snore loudly (loud enough to be heard through closed doors)?  1  Do you often feel tired, fatigued, or sleepy during the daytime? 0  Has anyone observed you stop breathing during your sleep? 1  Do you have, or are you being treated for high blood pressure? 1  BMI more than 35 kg/m2? 0  Age over 67 years old? 1  Neck circumference greater than 40 cm/16 inches? 0  Gender: 0  Obstructive Sleep Apnea Score 4  Score 4 or greater  Results sent to PCP

## 2014-09-12 NOTE — Progress Notes (Signed)
HgA1C result from 09/11/2014 faxed via EPIC to Dr Nydia Bouton.

## 2014-09-15 ENCOUNTER — Inpatient Hospital Stay (HOSPITAL_COMMUNITY): Payer: Medicare Other | Admitting: Anesthesiology

## 2014-09-15 ENCOUNTER — Encounter (HOSPITAL_COMMUNITY): Payer: Self-pay | Admitting: *Deleted

## 2014-09-15 ENCOUNTER — Encounter (HOSPITAL_COMMUNITY)
Admission: RE | Disposition: A | Discharge status: Home / Self Care | Payer: Self-pay | Source: Ambulatory Visit | Attending: Surgery

## 2014-09-15 ENCOUNTER — Inpatient Hospital Stay (HOSPITAL_COMMUNITY)
Admission: RE | Admit: 2014-09-15 | Discharge: 2014-09-21 | DRG: 330 | Disposition: A | Discharge status: Home / Self Care | Payer: Medicare Other | Source: Ambulatory Visit | Attending: Surgery | Admitting: Surgery

## 2014-09-15 DIAGNOSIS — Z9889 Other specified postprocedural states: Secondary | ICD-10-CM

## 2014-09-15 DIAGNOSIS — E079 Disorder of thyroid, unspecified: Secondary | ICD-10-CM | POA: Diagnosis present

## 2014-09-15 DIAGNOSIS — D62 Acute posthemorrhagic anemia: Secondary | ICD-10-CM | POA: Diagnosis not present

## 2014-09-15 DIAGNOSIS — K660 Peritoneal adhesions (postprocedural) (postinfection): Secondary | ICD-10-CM | POA: Diagnosis present

## 2014-09-15 DIAGNOSIS — K219 Gastro-esophageal reflux disease without esophagitis: Secondary | ICD-10-CM | POA: Diagnosis present

## 2014-09-15 DIAGNOSIS — I10 Essential (primary) hypertension: Secondary | ICD-10-CM | POA: Diagnosis present

## 2014-09-15 DIAGNOSIS — K5792 Diverticulitis of intestine, part unspecified, without perforation or abscess without bleeding: Secondary | ICD-10-CM | POA: Diagnosis present

## 2014-09-15 DIAGNOSIS — Z433 Encounter for attention to colostomy: Secondary | ICD-10-CM | POA: Diagnosis present

## 2014-09-15 DIAGNOSIS — C50911 Malignant neoplasm of unspecified site of right female breast: Secondary | ICD-10-CM | POA: Diagnosis present

## 2014-09-15 HISTORY — PX: COLON RESECTION: SHX5231

## 2014-09-15 LAB — TYPE AND SCREEN
ABO/RH(D): O POS
ANTIBODY SCREEN: NEGATIVE

## 2014-09-15 LAB — ABO/RH: ABO/RH(D): O POS

## 2014-09-15 SURGERY — COLON RESECTION LAPAROSCOPIC
Anesthesia: General | Site: Abdomen

## 2014-09-15 MED ORDER — PROPOFOL 10 MG/ML IV BOLUS
INTRAVENOUS | Status: AC
  Filled 2014-09-15: qty 20

## 2014-09-15 MED ORDER — SUFENTANIL CITRATE 50 MCG/ML IV SOLN
INTRAVENOUS | Status: DC | PRN
  Administered 2014-09-15: 5 ug via INTRAVENOUS
  Administered 2014-09-15 (×3): 10 ug via INTRAVENOUS
  Administered 2014-09-15: 15 ug via INTRAVENOUS

## 2014-09-15 MED ORDER — DEXTROSE 5 % IV SOLN
2.0000 g | Freq: Once | INTRAVENOUS | Status: AC
  Administered 2014-09-15: 2 g via INTRAVENOUS
  Filled 2014-09-15 (×2): qty 2

## 2014-09-15 MED ORDER — ONDANSETRON HCL 4 MG/2ML IJ SOLN
INTRAMUSCULAR | Status: AC
  Filled 2014-09-15: qty 2

## 2014-09-15 MED ORDER — BUPIVACAINE-EPINEPHRINE 0.25% -1:200000 IJ SOLN
INTRAMUSCULAR | Status: DC | PRN
  Administered 2014-09-15: 30 mL

## 2014-09-15 MED ORDER — MIDAZOLAM HCL 5 MG/5ML IJ SOLN
INTRAMUSCULAR | Status: DC | PRN
  Administered 2014-09-15 (×2): 1 mg via INTRAVENOUS

## 2014-09-15 MED ORDER — MORPHINE SULFATE (PF) 1 MG/ML IV SOLN
INTRAVENOUS | Status: AC
Start: 2014-09-15 — End: 2014-09-15
  Filled 2014-09-15: qty 25

## 2014-09-15 MED ORDER — SUCCINYLCHOLINE CHLORIDE 20 MG/ML IJ SOLN
INTRAMUSCULAR | Status: DC | PRN
  Administered 2014-09-15: 100 mg via INTRAVENOUS

## 2014-09-15 MED ORDER — HEPARIN SODIUM (PORCINE) 5000 UNIT/ML IJ SOLN
5000.0000 [IU] | Freq: Three times a day (TID) | INTRAMUSCULAR | Status: DC
  Administered 2014-09-15 – 2014-09-21 (×17): 5000 [IU] via SUBCUTANEOUS
  Filled 2014-09-15 (×20): qty 1

## 2014-09-15 MED ORDER — LACTATED RINGERS IV SOLN
INTRAVENOUS | Status: DC
Start: 2014-09-15 — End: 2014-09-15
  Administered 2014-09-15 (×2): via INTRAVENOUS
  Administered 2014-09-15: 1000 mL via INTRAVENOUS
  Administered 2014-09-15: 15:00:00 via INTRAVENOUS

## 2014-09-15 MED ORDER — EPHEDRINE SULFATE 50 MG/ML IJ SOLN
INTRAMUSCULAR | Status: DC | PRN
  Administered 2014-09-15: 10 mg via INTRAVENOUS

## 2014-09-15 MED ORDER — LIDOCAINE HCL (CARDIAC) 20 MG/ML IV SOLN
INTRAVENOUS | Status: DC | PRN
  Administered 2014-09-15: 100 mg via INTRAVENOUS

## 2014-09-15 MED ORDER — CISATRACURIUM BESYLATE (PF) 10 MG/5ML IV SOLN
INTRAVENOUS | Status: DC | PRN
  Administered 2014-09-15: 8 mg via INTRAVENOUS
  Administered 2014-09-15 (×3): 4 mg via INTRAVENOUS
  Administered 2014-09-15: 2 mg via INTRAVENOUS
  Administered 2014-09-15: 4 mg via INTRAVENOUS
  Administered 2014-09-15: 1 mg via INTRAVENOUS
  Administered 2014-09-15: 2 mg via INTRAVENOUS

## 2014-09-15 MED ORDER — EPHEDRINE SULFATE 50 MG/ML IJ SOLN
INTRAMUSCULAR | Status: AC
  Filled 2014-09-15: qty 1

## 2014-09-15 MED ORDER — KCL IN DEXTROSE-NACL 20-5-0.45 MEQ/L-%-% IV SOLN
INTRAVENOUS | Status: DC
  Administered 2014-09-15 – 2014-09-16 (×2): via INTRAVENOUS
  Administered 2014-09-16: 125 mL/h via INTRAVENOUS
  Administered 2014-09-17 – 2014-09-19 (×5): via INTRAVENOUS
  Filled 2014-09-15 (×10): qty 1000

## 2014-09-15 MED ORDER — ONDANSETRON HCL 4 MG/2ML IJ SOLN
4.0000 mg | Freq: Four times a day (QID) | INTRAMUSCULAR | Status: DC | PRN
  Administered 2014-09-15: 4 mg via INTRAVENOUS

## 2014-09-15 MED ORDER — BUPIVACAINE-EPINEPHRINE 0.25% -1:200000 IJ SOLN
INTRAMUSCULAR | Status: AC
  Filled 2014-09-15: qty 1

## 2014-09-15 MED ORDER — CISATRACURIUM BESYLATE 20 MG/10ML IV SOLN
INTRAVENOUS | Status: AC
  Filled 2014-09-15: qty 10

## 2014-09-15 MED ORDER — PHENYLEPHRINE HCL 10 MG/ML IJ SOLN
INTRAMUSCULAR | Status: DC | PRN
  Administered 2014-09-15 (×2): 40 ug via INTRAVENOUS

## 2014-09-15 MED ORDER — DIPHENHYDRAMINE HCL 12.5 MG/5ML PO ELIX: 12.5000 mg | ORAL_SOLUTION | Freq: Four times a day (QID) | ORAL | Status: DC | PRN

## 2014-09-15 MED ORDER — NALOXONE HCL 0.4 MG/ML IJ SOLN: 0.4000 mg | INTRAMUSCULAR | Status: DC | PRN

## 2014-09-15 MED ORDER — MORPHINE SULFATE (PF) 1 MG/ML IV SOLN
INTRAVENOUS | Status: DC
  Administered 2014-09-15: 9 mg via INTRAVENOUS
  Administered 2014-09-15 – 2014-09-16 (×2): via INTRAVENOUS
  Administered 2014-09-16 (×2): 3 mg via INTRAVENOUS
  Administered 2014-09-16: 1.5 mg via INTRAVENOUS
  Administered 2014-09-16: 4.5 mg via INTRAVENOUS
  Administered 2014-09-16: 7.5 mL via INTRAVENOUS
  Administered 2014-09-17 (×2): 1.5 mg via INTRAVENOUS
  Administered 2014-09-17: 3 mg via INTRAVENOUS
  Administered 2014-09-17 – 2014-09-18 (×2): 1.5 mg via INTRAVENOUS
  Administered 2014-09-18: 3 mg via INTRAVENOUS
  Filled 2014-09-15: qty 25

## 2014-09-15 MED ORDER — SODIUM CHLORIDE 0.9 % IJ SOLN
INTRAMUSCULAR | Status: AC
  Filled 2014-09-15: qty 10

## 2014-09-15 MED ORDER — SUFENTANIL CITRATE 50 MCG/ML IV SOLN
INTRAVENOUS | Status: AC
  Filled 2014-09-15: qty 1

## 2014-09-15 MED ORDER — HYDROMORPHONE HCL 1 MG/ML IJ SOLN: 0.2500 mg | INTRAMUSCULAR | Status: DC | PRN

## 2014-09-15 MED ORDER — ONDANSETRON HCL 4 MG PO TABS: 4.0000 mg | ORAL_TABLET | Freq: Four times a day (QID) | ORAL | Status: DC | PRN

## 2014-09-15 MED ORDER — DEXAMETHASONE SODIUM PHOSPHATE 10 MG/ML IJ SOLN
INTRAMUSCULAR | Status: DC | PRN
  Administered 2014-09-15: 10 mg via INTRAVENOUS

## 2014-09-15 MED ORDER — GLYCOPYRROLATE 0.2 MG/ML IJ SOLN
INTRAMUSCULAR | Status: DC | PRN
  Administered 2014-09-15: .6 mg via INTRAVENOUS

## 2014-09-15 MED ORDER — DEXAMETHASONE SODIUM PHOSPHATE 10 MG/ML IJ SOLN
INTRAMUSCULAR | Status: AC
  Filled 2014-09-15: qty 1

## 2014-09-15 MED ORDER — ALVIMOPAN 12 MG PO CAPS
12.0000 mg | ORAL_CAPSULE | Freq: Two times a day (BID) | ORAL | Status: DC
  Administered 2014-09-16 – 2014-09-20 (×9): 12 mg via ORAL
  Filled 2014-09-15 (×12): qty 1

## 2014-09-15 MED ORDER — LACTATED RINGERS IV SOLN: INTRAVENOUS | Status: DC

## 2014-09-15 MED ORDER — HYDROMORPHONE HCL 2 MG/ML IJ SOLN
INTRAMUSCULAR | Status: AC
  Filled 2014-09-15: qty 1

## 2014-09-15 MED ORDER — DIPHENHYDRAMINE HCL 50 MG/ML IJ SOLN: 12.5000 mg | Freq: Four times a day (QID) | INTRAMUSCULAR | Status: DC | PRN

## 2014-09-15 MED ORDER — ONDANSETRON HCL 4 MG/2ML IJ SOLN
4.0000 mg | Freq: Four times a day (QID) | INTRAMUSCULAR | Status: DC | PRN
Start: 2014-09-15 — End: 2014-09-18
  Filled 2014-09-15: qty 2

## 2014-09-15 MED ORDER — ALVIMOPAN 12 MG PO CAPS
12.0000 mg | ORAL_CAPSULE | Freq: Once | ORAL | Status: AC
  Administered 2014-09-15: 12 mg via ORAL
  Filled 2014-09-15: qty 1

## 2014-09-15 MED ORDER — GLYCOPYRROLATE 0.2 MG/ML IJ SOLN
INTRAMUSCULAR | Status: AC
  Filled 2014-09-15: qty 3

## 2014-09-15 MED ORDER — 0.9 % SODIUM CHLORIDE (POUR BTL) OPTIME
TOPICAL | Status: DC | PRN
  Administered 2014-09-15 (×3): 1000 mL

## 2014-09-15 MED ORDER — PHENYLEPHRINE 40 MCG/ML (10ML) SYRINGE FOR IV PUSH (FOR BLOOD PRESSURE SUPPORT)
PREFILLED_SYRINGE | INTRAVENOUS | Status: AC
  Filled 2014-09-15: qty 10

## 2014-09-15 MED ORDER — DEXTROSE 5 % IV SOLN
2.0000 g | INTRAVENOUS | Status: AC
  Administered 2014-09-15: 2 g via INTRAVENOUS
  Filled 2014-09-15: qty 2

## 2014-09-15 MED ORDER — LIDOCAINE HCL (CARDIAC) 20 MG/ML IV SOLN
INTRAVENOUS | Status: AC
  Filled 2014-09-15: qty 5

## 2014-09-15 MED ORDER — LACTATED RINGERS IR SOLN
Status: DC | PRN
  Administered 2014-09-15: 1

## 2014-09-15 MED ORDER — PROPOFOL 10 MG/ML IV BOLUS
INTRAVENOUS | Status: DC | PRN
  Administered 2014-09-15: 150 mg via INTRAVENOUS

## 2014-09-15 MED ORDER — HYDROMORPHONE HCL 1 MG/ML IJ SOLN
INTRAMUSCULAR | Status: DC | PRN
  Administered 2014-09-15 (×5): .4 mg via INTRAVENOUS

## 2014-09-15 MED ORDER — MIDAZOLAM HCL 2 MG/2ML IJ SOLN
INTRAMUSCULAR | Status: AC
  Filled 2014-09-15: qty 2

## 2014-09-15 MED ORDER — NEOSTIGMINE METHYLSULFATE 10 MG/10ML IV SOLN
INTRAVENOUS | Status: DC | PRN
Start: 2014-09-15 — End: 2014-09-15
  Administered 2014-09-15: 4 mg via INTRAVENOUS

## 2014-09-15 MED ORDER — SODIUM CHLORIDE 0.9 % IJ SOLN: 9.0000 mL | INTRAMUSCULAR | Status: DC | PRN

## 2014-09-15 MED ORDER — ONDANSETRON HCL 4 MG/2ML IJ SOLN
INTRAMUSCULAR | Status: DC | PRN
  Administered 2014-09-15: 4 mg via INTRAVENOUS

## 2014-09-15 MED ORDER — HEPARIN SODIUM (PORCINE) 5000 UNIT/ML IJ SOLN
5000.0000 [IU] | Freq: Once | INTRAMUSCULAR | Status: AC
  Administered 2014-09-15: 5000 [IU] via SUBCUTANEOUS
  Filled 2014-09-15: qty 1

## 2014-09-15 SURGICAL SUPPLY — 79 items
APPLIER CLIP 5 13 M/L LIGAMAX5 (MISCELLANEOUS)
APPLIER CLIP ROT 10 11.4 M/L (STAPLE)
APR CLP MED LRG 11.4X10 (STAPLE)
APR CLP MED LRG 5 ANG JAW (MISCELLANEOUS)
BLADE EXTENDED COATED 6.5IN (ELECTRODE) ×3 IMPLANT
BLADE HEX COATED 2.75 (ELECTRODE) ×6 IMPLANT
CELLS DAT CNTRL 66122 CELL SVR (MISCELLANEOUS) IMPLANT
CLIP APPLIE 5 13 M/L LIGAMAX5 (MISCELLANEOUS) IMPLANT
CLIP APPLIE ROT 10 11.4 M/L (STAPLE) IMPLANT
CLOSURE WOUND 1/2 X4 (GAUZE/BANDAGES/DRESSINGS)
COUNTER NEEDLE 20 DBL MAG RED (NEEDLE) ×3 IMPLANT
COVER MAYO STAND STRL (DRAPES) ×2 IMPLANT
CUTTER FLEX LINEAR 45M (STAPLE) IMPLANT
DECANTER SPIKE VIAL GLASS SM (MISCELLANEOUS) ×3 IMPLANT
DRAPE LAPAROSCOPIC ABDOMINAL (DRAPES) ×3 IMPLANT
DRAPE UTILITY XL STRL (DRAPES) ×3 IMPLANT
DRSG OPSITE POSTOP 4X8 (GAUZE/BANDAGES/DRESSINGS) ×2 IMPLANT
DRSG TELFA 3X8 NADH (GAUZE/BANDAGES/DRESSINGS) IMPLANT
ELECT REM PT RETURN 9FT ADLT (ELECTROSURGICAL) ×3
ELECTRODE REM PT RTRN 9FT ADLT (ELECTROSURGICAL) ×1 IMPLANT
FILTER SMOKE EVAC LAPAROSHD (FILTER) IMPLANT
GAUZE SPONGE 4X4 12PLY STRL (GAUZE/BANDAGES/DRESSINGS) ×2 IMPLANT
GLOVE SURG SIGNA 7.5 PF LTX (GLOVE) ×6 IMPLANT
GOWN SPEC L4 XLG W/TWL (GOWN DISPOSABLE) ×3 IMPLANT
GOWN STRL REUS W/ TWL XL LVL3 (GOWN DISPOSABLE) ×3 IMPLANT
GOWN STRL REUS W/TWL 2XL LVL3 (GOWN DISPOSABLE) ×12 IMPLANT
GOWN STRL REUS W/TWL XL LVL3 (GOWN DISPOSABLE) ×24
LEGGING LITHOTOMY PAIR STRL (DRAPES) ×2 IMPLANT
LIGASURE IMPACT 36 18CM CVD LR (INSTRUMENTS) IMPLANT
LIQUID BAND (GAUZE/BANDAGES/DRESSINGS) IMPLANT
NS IRRIG 1000ML POUR BTL (IV SOLUTION) ×12 IMPLANT
PACK COLON (CUSTOM PROCEDURE TRAY) ×3 IMPLANT
PAD DRESSING TELFA 3X8 NADH (GAUZE/BANDAGES/DRESSINGS) IMPLANT
PAD TELFA 2X3 NADH STRL (GAUZE/BANDAGES/DRESSINGS) ×2 IMPLANT
RELOAD 45 VASCULAR/THIN (ENDOMECHANICALS) IMPLANT
RELOAD STAPLE 45 3.5 BLU ETS (ENDOMECHANICALS) IMPLANT
RELOAD STAPLE 60 3.6 BLU REG (STAPLE) ×1 IMPLANT
RELOAD STAPLE TA45 3.5 REG BLU (ENDOMECHANICALS) IMPLANT
RELOAD STAPLER BLUE 60MM (STAPLE) IMPLANT
RETRACTOR WND ALEXIS 18 MED (MISCELLANEOUS) IMPLANT
RTRCTR WOUND ALEXIS 18CM MED (MISCELLANEOUS)
SET IRRIG TUBING LAPAROSCOPIC (IRRIGATION / IRRIGATOR) ×3 IMPLANT
SHEARS HARMONIC ACE PLUS 36CM (ENDOMECHANICALS) IMPLANT
SLEEVE ADV FIXATION 5X100MM (TROCAR) ×4 IMPLANT
SLEEVE XCEL OPT CAN 5 100 (ENDOMECHANICALS) ×3 IMPLANT
SOLUTION ANTI FOG 6CC (MISCELLANEOUS) ×3 IMPLANT
STAPLE ECHEON FLEX 60 POW ENDO (STAPLE) IMPLANT
STAPLER CUT CVD 40MM GREEN (STAPLE) ×2 IMPLANT
STAPLER RELOAD BLUE 60MM (STAPLE)
STAPLER VISISTAT 35W (STAPLE) ×2 IMPLANT
STRIP CLOSURE SKIN 1/2X4 (GAUZE/BANDAGES/DRESSINGS) IMPLANT
SUT MON AB 5-0 PS2 18 (SUTURE) ×1 IMPLANT
SUT NOVA 1 T20/GS 25DT (SUTURE) ×2 IMPLANT
SUT NOVA NAB DX-16 0-1 5-0 T12 (SUTURE) IMPLANT
SUT PDS AB 1 CTX 36 (SUTURE) ×18 IMPLANT
SUT PROLENE 2 0 KS (SUTURE) IMPLANT
SUT PROLENE 2 0 SH DA (SUTURE) IMPLANT
SUT SILK 2 0 (SUTURE) ×3
SUT SILK 2 0 SH CR/8 (SUTURE) ×11 IMPLANT
SUT SILK 2-0 18XBRD TIE 12 (SUTURE) ×1 IMPLANT
SUT SILK 3 0 (SUTURE) ×3
SUT SILK 3 0 SH CR/8 (SUTURE) ×3 IMPLANT
SUT SILK 3-0 18XBRD TIE 12 (SUTURE) ×1 IMPLANT
SYS LAPSCP GELPORT 120MM (MISCELLANEOUS) ×3
SYSTEM LAPSCP GELPORT 120MM (MISCELLANEOUS) IMPLANT
TAPE CLOTH SURG 4X10 WHT LF (GAUZE/BANDAGES/DRESSINGS) ×2 IMPLANT
TOWEL OR NON WOVEN STRL DISP B (DISPOSABLE) ×6 IMPLANT
TRAY FOLEY CATH 14FRSI W/METER (CATHETERS) ×3 IMPLANT
TROCAR ADV FIXATION 5X100MM (TROCAR) ×3 IMPLANT
TROCAR BLADELESS OPT 5 100 (ENDOMECHANICALS) ×3 IMPLANT
TROCAR XCEL 12X100 BLDLESS (ENDOMECHANICALS) IMPLANT
TROCAR XCEL BLUNT TIP 100MML (ENDOMECHANICALS) IMPLANT
TROCAR XCEL NON-BLD 11X100MML (ENDOMECHANICALS) IMPLANT
TROCAR XCEL UNIV SLVE 11M 100M (ENDOMECHANICALS) IMPLANT
TUBING CONNECTING 10 (TUBING) ×1 IMPLANT
TUBING CONNECTING 10' (TUBING) ×1
TUBING FILTER THERMOFLATOR (ELECTROSURGICAL) ×3 IMPLANT
YANKAUER SUCT BULB TIP 10FT TU (MISCELLANEOUS) ×6 IMPLANT
YANKAUER SUCT BULB TIP NO VENT (SUCTIONS) IMPLANT

## 2014-09-15 NOTE — Transfer of Care (Signed)
Immediate Anesthesia Transfer of Care Note  Patient: Martha Evans  Procedure(s) Performed: Procedure(s): Laparoscopic hand assisted colostomy reversal with resection of left colon and laparoscopic lysis of adhesions  (N/A)  Patient Location: PACU  Anesthesia Type:General  Level of Consciousness: awake  Airway & Oxygen Therapy: Patient Spontanous Breathing and Patient connected to face mask oxygen  Post-op Assessment: Report given to PACU RN and Post -op Vital signs reviewed and stable  Post vital signs: Reviewed and stable  Complications: No apparent anesthesia complications

## 2014-09-15 NOTE — Op Note (Signed)
09/15/2014  3:11 PM  PATIENT:  Martha Evans, 68 y.o., female, MRN: [id number]  PREOP DIAGNOSIS:  History of perforation of proximal left colon secondary to diverticulitis, end colostomy  POSTOP DIAGNOSIS:   History of perforation of proximal left colon secondary to diverticulitis, end colostomy, possible diverticular scarring at left colon/sigmoid colon junction.  PROCEDURE:   Procedure(s):  Laparoscopic assisted colostomy reversal with resection of left colon/proximal sigmoid colon and laparoscopic lysis of adhesions (1 hour), rigid proctoscopy  SURGEON:   Alphonsa Overall, M.D.  ASSISTANTRockne Coons, M.D.  ANESTHESIA:   general  Anesthesiologist: Peyton Najjar, MD CRNA: Sharlette Dense, CRNA; Glory Buff, CRNA; Anne Fu, CRNA  General  EBL:  150  ml  BLOOD ADMINISTERED: none  DRAINS: none   LOCAL MEDICATIONS USED:   30 cc 1/4% marcaine  SPECIMEN:   Left colon/sigmoid colon (sutures proximal), and ostomy  COUNTS CORRECT:  YES  INDICATIONS FOR PROCEDURE:  [name] is a 68 y.o. (DOB: 11-16-1945) white  female whose primary care physician is [name], MD and comes for reversal of end colostomy.   Ms. Fromme presented with a perforated proximal left colon on 02/11/2014 which required a end left transverse colostomy and Hartmann procedure.  She was in the middle of chemotherapy for breast cancer.  She completed her chemotherapy and now is ready for the colostomy reversal.   The indications and risks of the surgery were explained to the patient.  The risks include, but are not limited to, infection, bleeding, and nerve injury.  Note dictated to:   [phone]

## 2014-09-15 NOTE — H&P (View-Only) (Signed)
Martha Evans. Rigdon 06/09/2014 12:09 PM Location: [facility] Patient #: [id number] DOB: 05/15/1946 Married / Language: English / Race: White Female  History of Present Illness  Patient words: Long term f/u colostomy take down.  The patient is a 68 year old female who presents with diverticulitis. She comes by herself. Her PCP is Dr. Alan Mulder [facility]). She is seeing Dr. Jana Hakim for med onc. She is seeing Dr. Ginger Carne for rad onc.  The patient had an exploratory laparotomy with removal of left colon and splenic flexure, Hartmann pouch, left transverse colon and colostomy by Dr. Alphonsa Overall 02/11/2014 for perf diverticulitis. The plan is to reverse her colostomy after she completed chemotherapy, after radiation therapy, and at least 6 months from the day of surgery.  She had an open wound treated with a VAC. She has right breast cancer, T1, Nx. she had an invasive ductal carcinoma, 0.8 cm, grade 2-3, ER 0% PR 0%, Ki-67 - 84%, her 2 neu negative. Note: Dr. Harlow Asa did her breast sugery and thyroid surgery. I have repeatedly asked her about him following her colon, but she has requested that I take care of it. She starts radiation tx with Dr. Donella Stade soon, to be finished Oct 25.  BE - 07/11/2014 - 1. Extensive diverticulosis of the distal descending and sigmoid portions of the mucous fistula. 2. Scattered diverticula in the transverse colon.  Colonoscopy - 07/22/2014 - rectal segment 35 - 40 cm  She is married. Her husband cannot look at her wounds.  Other Problems Martha Jordan, LPN; 02/24/4131 4:40 PM) HISTORY OF DIVERTICULITIS OF COLON (V12.79  Z87.19) Breast Cancer Gastroesophageal Reflux Disease High blood pressure Thyroid Disease  Past Surgical History Martha Jordan, LPN; 10/01/7251 6:64 PM) Breast Biopsy Right. Breast Mass; Local Excision Right. Colon Polyp Removal - Colonoscopy Colon Removal - Partial Hysterectomy (not due to cancer) -  Complete Mammoplasty; Reduction Bilateral. Resection of Small Bowel Shoulder Surgery Right. Thyroid Surgery Tonsillectomy  Diagnostic Studies History Martha Jordan, LPN; 12/30/4740 5:95 PM) Colonoscopy 5-10 years ago Mammogram within last year Pap Smear 1-5 years ago  Social History Martha Jordan, LPN; 6/38/7564 3:32 PM) Alcohol use Occasional alcohol use. Caffeine use Coffee, Tea. No drug use Tobacco use Never smoker.  Family History Martha Jordan, LPN; 9/51/8841 6:60 PM) Diabetes Mellitus Father. Kidney Disease Father. Malignant Neoplasm Of Pancreas Father. Migraine Headache Mother.  Pregnancy / Birth History Martha Jordan, LPN; 03/28/1600 0:93 PM) Age at menarche 83 years. Age of menopause 63-55 Gravida 0 Para 0  Vitals Martha Raring Kendrick LPN; 2/35/5732 20:25 PM) 06/09/2014 12:41 PM Height: 64in Temp.: 66F  Resp.: 18 (Unlabored)  BP: 132/84 (Sitting, Left Arm, Standard)   Physical Exam Martha Evans H. Lucia Gaskins MD; 06/09/2014 12:38 PM) The physical exam findings are as follows: Note:General - WN WF. She has more of her hair coming in. She has gray hair and I think will turn it blond when it gets long enough. Lungs - Clear Heart - RRR Abdomen - Well healed midline incision. LUQ ostomy looks good.  Assessment & Plan  1  HISTORY OF DIVERTICULITIS OF COLON (V12.79  Z87.19)  Impression: Doing well.  For reversal of colostomy.  Discussed the surgery with the patient.  Will try to do as much as possible laparoscopically.  The risks of surgery include, but are not limited to, bleeding, infection, leak from the colon, and recurrent diverticulitis.  She has a bowel prep.   2.  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

## 2014-09-15 NOTE — Anesthesia Procedure Notes (Addendum)
Procedure Name: Intubation Date/Time: 09/15/2014 10:04 AM Performed by: Danley Danker L Patient Re-evaluated:Patient Re-evaluated prior to inductionOxygen Delivery Method: Circle system utilized Preoxygenation: Pre-oxygenation with 100% oxygen Intubation Type: IV induction Ventilation: Mask ventilation without difficulty Laryngoscope Size: Mac and 3 Grade View: Grade I Tube type: Glide rite Tube size: 7.5 mm Number of attempts: 1 Airway Equipment and Method: Video-laryngoscopy Placement Confirmation: ETT inserted through vocal cords under direct vision,  breath sounds checked- equal and bilateral and positive ETCO2 Secured at: 21 cm Tube secured with: Tape Dental Injury: Teeth and Oropharynx as per pre-operative assessment  Difficulty Due To: Difficulty was anticipated, Difficult Airway- due to anterior larynx, Difficult Airway- due to limited oral opening and Difficult Airway- due to immobile epiglottis Future Recommendations: Recommend- induction with short-acting agent, and alternative techniques readily available Comments: Grade 1 with Glidescope MAC 3

## 2014-09-15 NOTE — Interval H&P Note (Signed)
History and Physical Interval Note:  09/15/2014 9:54 AM  Martha Evans  has presented today for surgery, with the diagnosis of diverticulitis  The various methods of treatment have been discussed with the patient and family. Husband is here with the patient.  After consideration of risks, benefits and other options for treatment, the patient has consented to  Procedure(s): Laparoscopic colostomy reversal  (N/A) as a surgical intervention .  The patient's history has been reviewed, patient examined, no change in status, stable for surgery.  I have reviewed the patient's chart and labs.  Questions were answered to the patient's satisfaction.     Marico Buckle H

## 2014-09-15 NOTE — Anesthesia Postprocedure Evaluation (Signed)
  Anesthesia Post-op Note  Patient: Martha Evans  Procedure(s) Performed: Procedure(s) (LRB): Laparoscopic hand assisted colostomy reversal with resection of left colon and laparoscopic lysis of adhesions  (N/A)  Patient Location: PACU  Anesthesia Type: General  Level of Consciousness: awake and alert   Airway and Oxygen Therapy: Patient Spontanous Breathing  Post-op Pain: mild  Post-op Assessment: Post-op Vital signs reviewed, Patient's Cardiovascular Status Stable, Respiratory Function Stable, Patent Airway and No signs of Nausea or vomiting  Last Vitals:  Filed Vitals:   09/15/14 1607  BP:   Pulse:   Temp:   Resp: 12    Post-op Vital Signs: stable   Complications: No apparent anesthesia complications

## 2014-09-16 LAB — CBC
HCT: 29.9 % — ABNORMAL LOW (ref 36.0–46.0)
Hemoglobin: 9.7 g/dL — ABNORMAL LOW (ref 12.0–15.0)
MCH: 30.1 pg (ref 26.0–34.0)
MCHC: 32.4 g/dL (ref 30.0–36.0)
MCV: 92.9 fL (ref 78.0–100.0)
PLATELETS: 178 10*3/uL (ref 150–400)
RBC: 3.22 MIL/uL — AB (ref 3.87–5.11)
RDW: 12.4 % (ref 11.5–15.5)
WBC: 15.3 10*3/uL — AB (ref 4.0–10.5)

## 2014-09-16 LAB — BASIC METABOLIC PANEL
ANION GAP: 11 (ref 5–15)
BUN: 16 mg/dL (ref 6–23)
CALCIUM: 8.9 mg/dL (ref 8.4–10.5)
CO2: 26 mEq/L (ref 19–32)
Chloride: 98 mEq/L (ref 96–112)
Creatinine, Ser: 0.94 mg/dL (ref 0.50–1.10)
GFR calc Af Amer: 71 mL/min — ABNORMAL LOW (ref >90)
GFR calc non Af Amer: 61 mL/min — ABNORMAL LOW (ref >90)
GLUCOSE: 128 mg/dL — AB (ref 70–99)
POTASSIUM: 4.3 meq/L (ref 3.7–5.3)
SODIUM: 135 meq/L — AB (ref 137–147)

## 2014-09-16 MED ORDER — VITAMINS A & D EX OINT
TOPICAL_OINTMENT | CUTANEOUS | Status: AC
  Administered 2014-09-16: 1
  Filled 2014-09-16: qty 5

## 2014-09-16 MED ORDER — SODIUM CHLORIDE 0.9 % IJ SOLN
10.0000 mL | INTRAMUSCULAR | Status: DC | PRN
  Administered 2014-09-17 – 2014-09-21 (×6): 10 mL
  Filled 2014-09-16 (×6): qty 40

## 2014-09-16 NOTE — Progress Notes (Signed)
Report called to [place], Therapist, sports.  Transferring to room 1230.

## 2014-09-16 NOTE — Op Note (Signed)
NAMEHANSINI, Martha Evans                 ACCOUNT NO.:  [id number]  MEDICAL RECORD NO.:  [id number]  LOCATION:  1962                         FACILITY:  [facility]  PHYSICIAN:  Fenton Malling. Lucia Gaskins, M.D.  DATE OF BIRTH:  May 22, 1946  DATE OF PROCEDURE:  09/15/2014                              OPERATIVE REPORT  PREOPERATIVE DIAGNOSIS:  History of perforated proximal left colon secondary to diverticular disease with end left transverse colostomy.  POSTOPERATIVE DIAGNOSIS:  History of perforation of proximal left colon secondary to diverticular disease with end left colon colostomy, possible diverticular scarring/stricture at left colon/sigmoid junction, adhesions of omentum and small/large bowel.  PROCEDURE:  Laparoscopic-assisted colostomy reversal with resection of left colon to proximal sigmoid colon, end-to-end anastomosis (hand sewn), laparoscopic lysis of adhesions for 1 hour, rigid proctoscopy.  SURGEON:  Fenton Malling. Lucia Gaskins, M.D.  FIRST ASSISTANT:  Isabel Caprice. Hassell Done, MD  ANESTHESIA:  General endotracheal.  ESTIMATED BLOOD LOSS:  150 mL.  DRAINS:  Left in was, some Telfa wicks in the wound.  LOCAL ANESTHESIA:  30 mL of 0.25% Marcaine.  INDICATION FOR PROCEDURE:  Ms. Stodghill is a 68 year old white female, who sees Dr. Juanetta Beets as her primary care doctor.  She was being actively treated for triple negative right breast cancer in May 2015, when she presented with an acute abdomen.  She was found to have a perforation of her proximal left colon and underwent a partial left colectomy with end left colostomy.  She has chemotherapy supervised by Dr. Lurline Del.  She completed her chemotherapy.  She is now 7 months from her original surgery and is ready for reversal of her colostomy.  In preparation for this, I did a barium enema, which showed a fair amount of left colon remaining, and she has a narrowed left colon/sigmoid colon in the pelvis.  I tried to colonoscope her and got to about 35-40 cm before  running into narrowing of the colon.  I think this narrowing corresponds to what is visualized on the barium enema.  I discussed with the patient the indications, potential complications of the surgery, potential complications include, but are not limited to, bleeding, infection, nerve injury, leak from the bowel, and recurrent diverticular disease.  OPERATIVE NOTE:  [facility] to [facility], was taken to room #1, where she underwent a general anesthesia.  She was given 2 g of cefoxitin initially in the procedure.  She was placed in lithotomy position and had a Foley catheter placed and orogastric tube in place.  Her colostomy was sewn closed.  Her abdomen was prepped with Betadine and ChloraPrep and sterilely draped.  A time-out was held and surgical checklist run.    I accessed her abdominal cavity through the left upper quadrant 5-mm trocar.  I placed 4 additional 5 mm trocars: 1 in the right lower quadrant, 1 in the midline and the inner incision, 1 in the left upper quadrant and 1 in the left lower quadrant.  I spent about an hour taking out adhesions.  These adhesions included omentum to the anterior peritoneal surface, small bowel stuck down in the pelvis and then her distal left colon sigmoid colon adhered along the  left pelvic gutter and into the pelvis.  I was able to get it down the adhesions of the omentum and thought I had an adequate proximal colon. However, I struggled with the distal dissection around the distal left sigmoid colon.  Because the left colon/sigmoid colon went to the retroperitoneum, I was concerned about getting the bowel up.  I had identified the left ureter posterior to our dissection.  I made a lower midline incision.  I placed a wound protector into the wound and mobilized the remainder of the distal left colon, proximal sigmoid colon.  The distal left colon/sigmoid colon appeared to be chronically inflamed and my suspicion is, this may have led to her  blowing at her colon more proximally and this may have been a relative stricture of her colon.  I dissected to a segment of the distal 1/3rd of her sigmoid colon, where there were no diverticula.  The bowel looked good and I divided this with a green load contour stapler.  I sent the left colon/prox sigmoid colon specimen to Pathology.  There were blue Prolene sutures marked in the proximal end.   I then irrigated the abdomen with about a liter of saline.  I then mobilized the ostomy by making an incision around the ostomy site.  Taken this down into the peritoneal cavity.  Once I freed up the ostomy, the colon easily came down to distal third of the sigmoid colon.  I thought it was a little long to reach with the EEA and thought she will be well served with a hand-sewn anastomosis.  So, I did an interrupted hand-sewn anastomosis using interrupted 2-0 silk suture.  I placed the posterior row and most of the anterior row in inverting fashion.  I did Gambee sutures anteriorly and then tacked the colon to itself.  At this point, Dr. Hassell Done broke scrub and went below.  He insufflated the rectum with a proctoscope.  He did not try to visualize the anastomosis.  He did dilate the anastomosis under air pressure and I put this under water and there is no leak.  I then tried to place the new left colon and anastomosis along the left colonic gutter with colon behind the small bowel.  I then closed the abdominal cavity.  I closed the ostomy site with interrupted #1 PDS sutures.  I closed the midline incision with #1 PDS sutures with some interrupted #1 Novafil sutures.  I irrigated both wounds thoroughly with saline for about a liter and a half and stapled the 2 wounds closed.  I re-laparoscoped the patient.  I injected both wounds with 30 cc of 0.25% Marcaine.  I again tried to make sure the small bowel was anterior to the new left colon.  There was no bleeding.  I irrigated with about a liter of saline.  I then  removed the 5 mm trocars.  I closed the skin with a staple gun, placed some Telfa wicks in the ostomy wound, and sterilely dressed them with sterile gauze.  The patient tolerated the procedure well, was transported to recovery room in good condition.  Sponge and needle count were correct at the end of the case.   Fenton Malling. Lucia Gaskins, M.D., Allegra Grana, Secretary for [employer]   DHN/MEDQ  D:  09/15/2014  T:  09/16/2014  Job:  [id number]  cc:   Thea Silversmith, M.D. Fax: [phone]  Bartholome Bill, MD Fax: [phone]  [name], MD Fax: [phone]  Chauncey Cruel, M.D. Fax: [phone]

## 2014-09-16 NOTE — Progress Notes (Addendum)
1 Day Post-Op  Subjective: Good pain control.  No nausea.  Objective: Vital signs in last 24 hours: Temp:  [97.5 F (36.4 C)-98.3 F (36.8 C)] 98.3 F (36.8 C) (12/19 0349) Pulse Rate:  [29-91] 67 (12/19 0600) Resp:  [11-24] 12 (12/19 0600) BP: (104-139)/(47-81) 106/49 mmHg (12/19 0600) SpO2:  [94 %-100 %] 97 % (12/19 0600) Weight:  [192 lb 0.3 oz (87.1 kg)] 192 lb 0.3 oz (87.1 kg) (12/19 0413)    Intake/Output from previous day: 12/18 0701 - 12/19 0700 In: 4725 [I.V.:4625; IV Piggyback:100] Out: 1700 [Urine:1320; Blood:150] Intake/Output this shift:    PE: General- In NAD Abdomen-soft, quiet, some of the dressings have dried drainage on them  Lab Results:   Recent Labs  09/16/14 0633  WBC 15.3*  HGB 9.7*  HCT 29.9*  PLT 178   BMET  Recent Labs  09/16/14 0633  NA 135*  K 4.3  CL 98  CO2 26  GLUCOSE 128*  BUN PENDING  CREATININE 0.94  CALCIUM 8.9   PT/INR No results for input(s): LABPROT, INR in the last 72 hours. Comprehensive Metabolic Panel:    Component Value Date/Time   NA 135* 09/16/2014 0633   NA 143 09/11/2014 1400   NA 139 06/28/2014 1558   NA 139 05/29/2014 1349   K 4.3 09/16/2014 0633   K 4.1 09/11/2014 1400   K 3.6 06/28/2014 1558   K 3.7 05/29/2014 1349   CL 98 09/16/2014 0633   CL 105 09/11/2014 1400   CO2 26 09/16/2014 0633   CO2 25 09/11/2014 1400   CO2 27 06/28/2014 1558   CO2 25 05/29/2014 1349   BUN PENDING 09/16/2014 0633   BUN 23 09/11/2014 1400   BUN 22.6 06/28/2014 1558   BUN 17.6 05/29/2014 1349   CREATININE 0.94 09/16/2014 0633   CREATININE 0.96 09/11/2014 1400   CREATININE 1.0 06/28/2014 1558   CREATININE 1.2* 05/29/2014 1349   GLUCOSE 128* 09/16/2014 0633   GLUCOSE 112* 09/11/2014 1400   GLUCOSE 117 06/28/2014 1558   GLUCOSE 122 05/29/2014 1349   CALCIUM 8.9 09/16/2014 0633   CALCIUM 9.5 09/11/2014 1400   CALCIUM 9.8 06/28/2014 1558   CALCIUM 9.3 05/29/2014 1349   AST 16 06/28/2014 1558   AST 17  05/29/2014 1349   AST 19 02/15/2014 0329   AST 19 02/11/2014 2109   ALT 13 06/28/2014 1558   ALT 22 05/29/2014 1349   ALT 21 02/15/2014 0329   ALT 29 02/11/2014 2109   ALKPHOS 102 06/28/2014 1558   ALKPHOS 161* 05/29/2014 1349   ALKPHOS 107 02/15/2014 0329   ALKPHOS 96 02/11/2014 2109   BILITOT 0.32 06/28/2014 1558   BILITOT <0.20 05/29/2014 1349   BILITOT 0.6 02/15/2014 0329   BILITOT 1.7* 02/11/2014 2109   PROT 6.9 06/28/2014 1558   PROT 6.7 05/29/2014 1349   PROT 5.5* 02/15/2014 0329   PROT 5.1* 02/11/2014 2109   ALBUMIN 3.7 06/28/2014 1558   ALBUMIN 3.7 05/29/2014 1349   ALBUMIN 2.2* 02/15/2014 0329   ALBUMIN 2.0* 02/11/2014 2109     Studies/Results: No results found.  Anti-infectives: Anti-infectives    Start     Dose/Rate Route Frequency Ordered Stop   09/15/14 2200  cefoTEtan (CEFOTAN) 2 g in dextrose 5 % 50 mL IVPB     2 g100 mL/hr over 30 Minutes Intravenous  Once 09/15/14 1711 09/15/14 2219   09/15/14 0747  cefoTEtan (CEFOTAN) 2 g in dextrose 5 % 50 mL IVPB  2 g100 mL/hr over 30 Minutes Intravenous On call to O.R. 09/15/14 0747 09/15/14 1010      Assessment Principal Problem:   S/P colostomy takedown 09/15/14-stable overnight course.   ABL anemia-HD normal    LOS: 1 day   Plan: Transfer to med/surg, OOB, incentive spirometry, sips of water and ice chips, cbc tomorrow.   Lemma Tetro J 09/16/2014

## 2014-09-16 NOTE — Progress Notes (Signed)
Per MD, do not remove urinary catheter today.

## 2014-09-17 LAB — CBC
HEMATOCRIT: 29.7 % — AB (ref 36.0–46.0)
HEMOGLOBIN: 9.2 g/dL — AB (ref 12.0–15.0)
MCH: 29.6 pg (ref 26.0–34.0)
MCHC: 31 g/dL (ref 30.0–36.0)
MCV: 95.5 fL (ref 78.0–100.0)
Platelets: 151 10*3/uL (ref 150–400)
RBC: 3.11 MIL/uL — AB (ref 3.87–5.11)
RDW: 12.7 % (ref 11.5–15.5)
WBC: 9.8 10*3/uL (ref 4.0–10.5)

## 2014-09-17 NOTE — Progress Notes (Signed)
2 Days Post-Op  Subjective: Good pain control.  No nausea.  No flatus   Objective: Vital signs in last 24 hours: Temp:  [98.4 F (36.9 Evans)-99.4 F (37.4 Evans)] 99.4 F (37.4 Evans) (12/20 0415) Pulse Rate:  [68-97] 92 (12/20 0415) Resp:  [12-21] 12 (12/20 0747) BP: (105-148)/(45-74) 148/74 mmHg (12/20 0415) SpO2:  [94 %-100 %] 97 % (12/20 0747)    Intake/Output from previous day: 12/19 0701 - 12/20 0700 In: 2875 [I.V.:2875] Out: 3800 [Urine:3800] Intake/Output this shift:    PE: General- In NAD Abdomen-soft Incisions: Clean,dry, intact  Lab Results:   Recent Labs  09/16/14 0633 09/17/14 0500  WBC 15.3* 9.8  HGB 9.7* 9.2*  HCT 29.9* 29.7*  PLT 178 151   BMET  Recent Labs  09/16/14 0633  NA 135*  K 4.3  CL 98  CO2 26  GLUCOSE 128*  BUN 16  CREATININE 0.94  CALCIUM 8.9   PT/INR No results for input(s): LABPROT, INR in the last 72 hours. Comprehensive Metabolic Panel:    Component Value Date/Time   NA 135* 09/16/2014 0633   NA 143 09/11/2014 1400   NA 139 06/28/2014 1558   NA 139 05/29/2014 1349   K 4.3 09/16/2014 0633   K 4.1 09/11/2014 1400   K 3.6 06/28/2014 1558   K 3.7 05/29/2014 1349   CL 98 09/16/2014 0633   CL 105 09/11/2014 1400   CO2 26 09/16/2014 0633   CO2 25 09/11/2014 1400   CO2 27 06/28/2014 1558   CO2 25 05/29/2014 1349   BUN 16 09/16/2014 0633   BUN 23 09/11/2014 1400   BUN 22.6 06/28/2014 1558   BUN 17.6 05/29/2014 1349   CREATININE 0.94 09/16/2014 0633   CREATININE 0.96 09/11/2014 1400   CREATININE 1.0 06/28/2014 1558   CREATININE 1.2* 05/29/2014 1349   GLUCOSE 128* 09/16/2014 0633   GLUCOSE 112* 09/11/2014 1400   GLUCOSE 117 06/28/2014 1558   GLUCOSE 122 05/29/2014 1349   CALCIUM 8.9 09/16/2014 0633   CALCIUM 9.5 09/11/2014 1400   CALCIUM 9.8 06/28/2014 1558   CALCIUM 9.3 05/29/2014 1349   AST 16 06/28/2014 1558   AST 17 05/29/2014 1349   AST 19 02/15/2014 0329   AST 19 02/11/2014 2109   ALT 13 06/28/2014 1558   ALT  22 05/29/2014 1349   ALT 21 02/15/2014 0329   ALT 29 02/11/2014 2109   ALKPHOS 102 06/28/2014 1558   ALKPHOS 161* 05/29/2014 1349   ALKPHOS 107 02/15/2014 0329   ALKPHOS 96 02/11/2014 2109   BILITOT 0.32 06/28/2014 1558   BILITOT <0.20 05/29/2014 1349   BILITOT 0.6 02/15/2014 0329   BILITOT 1.7* 02/11/2014 2109   PROT 6.9 06/28/2014 1558   PROT 6.7 05/29/2014 1349   PROT 5.5* 02/15/2014 0329   PROT 5.1* 02/11/2014 2109   ALBUMIN 3.7 06/28/2014 1558   ALBUMIN 3.7 05/29/2014 1349   ALBUMIN 2.2* 02/15/2014 0329   ALBUMIN 2.0* 02/11/2014 2109     Studies/Results: No results found.  Anti-infectives: Anti-infectives    Start     Dose/Rate Route Frequency Ordered Stop   09/15/14 2200  cefoTEtan (CEFOTAN) 2 g in dextrose 5 % 50 mL IVPB     2 g100 mL/hr over 30 Minutes Intravenous  Once 09/15/14 1711 09/15/14 2219   09/15/14 0747  cefoTEtan (CEFOTAN) 2 g in dextrose 5 % 50 mL IVPB     2 g100 mL/hr over 30 Minutes Intravenous On call to O.R. 09/15/14 0747 09/15/14 1010  Assessment Principal Problem:   S/P colostomy takedown 09/15/14-stable course.   ABL anemia-HD normal, Hgb stabilizing     LOS: 2 days   Plan: OOB/Ambulate, incentive spirometry, sips of clears, cbc tomorrow.   Martha Evans. 73/66/8159

## 2014-09-18 ENCOUNTER — Encounter (HOSPITAL_COMMUNITY): Payer: Self-pay | Admitting: Surgery

## 2014-09-18 LAB — BASIC METABOLIC PANEL
ANION GAP: 11 (ref 5–15)
BUN: 9 mg/dL (ref 6–23)
CHLORIDE: 102 meq/L (ref 96–112)
CO2: 28 mEq/L (ref 19–32)
Calcium: 9.4 mg/dL (ref 8.4–10.5)
Creatinine, Ser: 0.87 mg/dL (ref 0.50–1.10)
GFR calc non Af Amer: 67 mL/min — ABNORMAL LOW (ref >90)
GFR, EST AFRICAN AMERICAN: 78 mL/min — AB (ref >90)
Glucose, Bld: 107 mg/dL — ABNORMAL HIGH (ref 70–99)
POTASSIUM: 3.7 meq/L (ref 3.7–5.3)
Sodium: 141 mEq/L (ref 137–147)

## 2014-09-18 LAB — CBC
HCT: 30.1 % — ABNORMAL LOW (ref 36.0–46.0)
Hemoglobin: 9.6 g/dL — ABNORMAL LOW (ref 12.0–15.0)
MCH: 30.2 pg (ref 26.0–34.0)
MCHC: 31.9 g/dL (ref 30.0–36.0)
MCV: 94.7 fL (ref 78.0–100.0)
PLATELETS: 161 10*3/uL (ref 150–400)
RBC: 3.18 MIL/uL — ABNORMAL LOW (ref 3.87–5.11)
RDW: 12.4 % (ref 11.5–15.5)
WBC: 7.5 10*3/uL (ref 4.0–10.5)

## 2014-09-18 MED ORDER — MORPHINE SULFATE 2 MG/ML IJ SOLN
1.0000 mg | INTRAMUSCULAR | Status: DC | PRN
  Administered 2014-09-19 (×2): 2 mg via INTRAVENOUS
  Filled 2014-09-18 (×2): qty 1

## 2014-09-18 NOTE — Progress Notes (Signed)
General Surgery Note  LOS: 3 days  POD -  3 Days Post-Op  Assessment/Plan: 1.  Laparoscopic hand assisted colostomy reversal with resection of left colon and laparoscopic lysis of adhesions - 09/15/2014 - D. Alysiana Ethridge  On Entereg  Doing well, but no bowel function  Will allow sips from the floor  2.  Anemia  Hgb - 9.6 - 11/19/2013  3.  Right breast cancer, T1, Nx    Invasive ductal ca (metaplastic), 0.8 cm, Grade II-III, ER 0%, PR 0%, Ki-67 - 84%, HER-2/neu negative.   Oncolopgy - Drs. Magrinat/Crystal  Right lumpectomy - 12/13/2013 - T. Gerkin  4. HTN  5. GERD  6. Left subclavian power port - April 2015 - Gerkin 7.   DVT prophylaxis - SQ Heparin   Principal Problem:   S/P colostomy takedown 09/15/14  Subjective:  Doing well.  No BM or flatus.  Objective:   Filed Vitals:   09/18/14 0442  BP: 147/68  Pulse: 80  Temp: 97.9 F (36.6 C)  Resp: 22     Intake/Output from previous day:  12/20 0701 - 12/21 0700 In: 1962.5 [P.O.:60; I.V.:1902.5] Out: 2500 [Urine:2500]  Intake/Output this shift:      Physical Exam:   General: WN WF who is alert and oriented.    HEENT: Normal. Pupils equal. .   Lungs: Clear.  Good inspiration   Abdomen: Soft.  BS present.   Wound: Wicks removed from ostomy wound.      Lab Results:    Recent Labs  09/17/14 0500 09/18/14 0440  WBC 9.8 7.5  HGB 9.2* 9.6*  HCT 29.7* 30.1*  PLT 151 161    BMET   Recent Labs  09/16/14 0633 09/18/14 0440  NA 135* 141  K 4.3 3.7  CL 98 102  CO2 26 28  GLUCOSE 128* 107*  BUN 16 9  CREATININE 0.94 0.87  CALCIUM 8.9 9.4    PT/INR  No results for input(s): LABPROT, INR in the last 72 hours.  ABG  No results for input(s): PHART, HCO3 in the last 72 hours.  Invalid input(s): PCO2, PO2   Studies/Results:  No results found.   Anti-infectives:   Anti-infectives    Start     Dose/Rate Route Frequency Ordered Stop   09/15/14 2200  cefoTEtan (CEFOTAN) 2 g in dextrose 5 % 50 mL IVPB     2  g100 mL/hr over 30 Minutes Intravenous  Once 09/15/14 1711 09/15/14 2219   09/15/14 0747  cefoTEtan (CEFOTAN) 2 g in dextrose 5 % 50 mL IVPB     2 g100 mL/hr over 30 Minutes Intravenous On call to O.R. 09/15/14 0747 09/15/14 1010      Alphonsa Overall, MD, FACS Pager: [facility] Office: [phone] 09/18/2014

## 2014-09-18 NOTE — Progress Notes (Signed)
PCA discontinued.  Wasted Morphine Sulfate 44mL with Horris Latino, RN

## 2014-09-19 MED ORDER — HYDROCODONE-ACETAMINOPHEN 5-325 MG PO TABS: 1.0000 | ORAL_TABLET | ORAL | Status: DC | PRN

## 2014-09-19 NOTE — Progress Notes (Signed)
General Surgery Note  LOS: 4 days  POD -  4 Days Post-Op  Assessment/Plan: 1.  Laparoscopic assisted colostomy reversal with resection of left colon and laparoscopic lysis of adhesions - 09/15/2014 - D. Magdaleno Lortie  On [employer] - diverticulitis, no malignancy  Passed flatus, to start clear liquids.  2.  Anemia  Hgb - 9.6 - 11/19/2013  3.  Right breast cancer, T1, Nx    Invasive ductal ca (metaplastic), 0.8 cm, Grade II-III, ER 0%, PR 0%, Ki-67 - 84%, HER-2/neu negative.   Oncolopgy - Drs. Magrinat/Crystal  Right lumpectomy - 12/13/2013 - T. Gerkin  4. HTN  5. GERD  6. Left subclavian power port - April 2015 - Gerkin 7.   DVT prophylaxis - SQ Heparin   Principal Problem:   S/P colostomy takedown 09/15/14  Subjective:  Passed flatus.  Appears to be doing well.  Objective:   Filed Vitals:   09/19/14 0600  BP: 120/63  Pulse: 82  Temp: 98 F (36.7 C)  Resp: 16     Intake/Output from previous day:  12/21 0701 - 12/22 0700 In: 1965 [P.O.:240; I.V.:1725] Out: 2376 [Urine:1650]  Intake/Output this shift:      Physical Exam:   General: WN WF who is alert and oriented.    HEENT: Normal. Pupils equal. .   Lungs: Clear.  Good inspiration   Abdomen: Soft.  BS present.   Wound: Clean     Lab Results:     Recent Labs  09/17/14 0500 09/18/14 0440  WBC 9.8 7.5  HGB 9.2* 9.6*  HCT 29.7* 30.1*  PLT 151 161    BMET    Recent Labs  09/18/14 0440  NA 141  K 3.7  CL 102  CO2 28  GLUCOSE 107*  BUN 9  CREATININE 0.87  CALCIUM 9.4    PT/INR  No results for input(s): LABPROT, INR in the last 72 hours.  ABG  No results for input(s): PHART, HCO3 in the last 72 hours.  Invalid input(s): PCO2, PO2   Studies/Results:  No results found.   Anti-infectives:   Anti-infectives    Start     Dose/Rate Route Frequency Ordered Stop   09/15/14 2200  cefoTEtan (CEFOTAN) 2 g in dextrose 5 % 50 mL IVPB     2 g100 mL/hr over 30 Minutes Intravenous  Once 09/15/14 1711  09/15/14 2219   09/15/14 0747  cefoTEtan (CEFOTAN) 2 g in dextrose 5 % 50 mL IVPB     2 g100 mL/hr over 30 Minutes Intravenous On call to O.R. 09/15/14 0747 09/15/14 1010      Alphonsa Overall, MD, FACS Pager: [facility] Office: [phone] 09/19/2014

## 2014-09-20 NOTE — Progress Notes (Signed)
General Surgery Note  LOS: 5 days  POD -  5 Days Post-Op  Assessment/Plan: 1.  Laparoscopic assisted colostomy reversal with resection of left colon and laparoscopic lysis of adhesions - 09/15/2014 - Martha Evans  On [employer] - diverticulitis, no malignancy  Looks good  Passed flatus, to start full liquids.  2.  Anemia  Hgb - 9.6 - 11/19/2013  3.  Right breast cancer, T1, Nx    Invasive ductal ca (metaplastic), 0.8 cm, Grade II-III, ER 0%, PR 0%, Ki-67 - 84%, HER-2/neu negative.   Oncolopgy - Drs. Magrinat/Crystal  Right lumpectomy - 12/13/2013 - T. Gerkin  4. HTN  5. GERD  6. Left subclavian power port - April 2015 - Gerkin 7.   DVT prophylaxis - SQ Heparin   Principal Problem:   S/P colostomy takedown 09/15/14  Subjective:  Passed flatus.  No BM yet.  Tolerated clear liquids.  Objective:   Filed Vitals:   09/20/14 0630  BP: 128/51  Pulse: 68  Temp: 98.3 F (36.8 C)  Resp: 16     Intake/Output from previous day:  12/22 0701 - 12/23 0700 In: 2022.1 [P.O.:1360; I.V.:662.1] Out: 3250 [Urine:3250]  Intake/Output this shift:      Physical Exam:   General: WN WF who is alert and oriented.    HEENT: Normal. Pupils equal. .   Lungs: Clear.  Good inspiration   Abdomen: Soft.  BS present.   Wound: Clean.  I have left dressing off.     Lab Results:     Recent Labs  09/18/14 0440  WBC 7.5  HGB 9.6*  HCT 30.1*  PLT 161    BMET    Recent Labs  09/18/14 0440  NA 141  K 3.7  CL 102  CO2 28  GLUCOSE 107*  BUN 9  CREATININE 0.87  CALCIUM 9.4    PT/INR  No results for input(s): LABPROT, INR in the last 72 hours.  ABG  No results for input(s): PHART, HCO3 in the last 72 hours.  Invalid input(s): PCO2, PO2   Studies/Results:  No results found.   Anti-infectives:   Anti-infectives    Start     Dose/Rate Route Frequency Ordered Stop   09/15/14 2200  cefoTEtan (CEFOTAN) 2 g in dextrose 5 % 50 mL IVPB     2 g100 mL/hr over 30 Minutes Intravenous   Once 09/15/14 1711 09/15/14 2219   09/15/14 0747  cefoTEtan (CEFOTAN) 2 g in dextrose 5 % 50 mL IVPB     2 g100 mL/hr over 30 Minutes Intravenous On call to O.R. 09/15/14 0747 09/15/14 1010      Alphonsa Overall, MD, FACS Pager: [facility] Office: [phone] 09/20/2014

## 2014-09-21 MED ORDER — HYDROCODONE-ACETAMINOPHEN 5-325 MG PO TABS: 1.0000 | ORAL_TABLET | ORAL | Status: DC | PRN

## 2014-09-21 MED ORDER — HEPARIN SOD (PORK) LOCK FLUSH 100 UNIT/ML IV SOLN
500.0000 [IU] | INTRAVENOUS | Status: AC | PRN
  Administered 2014-09-21: 500 [IU]

## 2014-09-21 NOTE — Discharge Summary (Signed)
Physician Discharge Summary  Patient ID:  Martha Evans  MRN: [id number]  DOB/AGE: March 20, 1946 68 y.o.  Admit date: 09/15/2014 Discharge date: 09/21/2014  Discharge Diagnoses:  1.  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 [facility] - diverticulitis, no malignancy  2. Anemia Hgb - 9.6 - 11/19/2013 3. Right breast cancer, T1, Nx  Invasive ductal ca (metaplastic), 0.8 cm, Grade II-III, ER 0%, PR 0%, Ki-67 - 84%, HER-2/neu negative.  Oncolopgy - Drs. Magrinat/Crystal Right lumpectomy - 12/13/2013 - T. Gerkin  4. HTN  5. GERD  6. Left subclavian power port - April 2015 - Gerkin   Principal Problem:   S/P colostomy takedown 09/15/14   Operation: Procedure(s): Laparoscopic assisted colostomy reversal with resection of left colon and laparoscopic lysis of adhesions  on 09/15/2014 - D. [facility]  Discharged Condition: good  Hospital Course: [name] is an 68 y.o. female whose primary care physician is [name], MD and who was admitted 09/15/2014 with a chief complaint of end colostomy from diverticular perforation.   She was brought to the operating room on 09/15/2014 and underwent  Laparoscopic assisted colostomy reversal with resection of left colon and laparoscopic lysis of adhesions  .   Post op she was kept NPO for about 4 days.  She then started having bowel function and her diet was advanced.  She is now on regular diet, she is having bowel movements, and she is ready to be discharged.  The discharge instructions were reviewed with the patient.  Consults: None  Significant Diagnostic Studies: Results for orders placed or performed during the hospital encounter of 84/13/24  Basic metabolic panel  Result Value Ref Range   Sodium 135 (L) 137 - 147 mEq/L   Potassium 4.3 3.7 -  5.3 mEq/L   Chloride 98 96 - 112 mEq/L   CO2 26 19 - 32 mEq/L   Glucose, Bld 128 (H) 70 - 99 mg/dL   BUN 16 6 - 23 mg/dL   Creatinine, Ser 0.94 0.50 - 1.10 mg/dL   Calcium 8.9 8.4 - 10.5 mg/dL   GFR calc non Af Amer 61 (L) >90 mL/min   GFR calc Af Amer 71 (L) >90 mL/min   Anion gap 11 5 - 15  CBC  Result Value Ref Range   WBC 15.3 (H) 4.0 - 10.5 K/uL   RBC 3.22 (L) 3.87 - 5.11 MIL/uL   Hemoglobin 9.7 (L) 12.0 - 15.0 g/dL   HCT 29.9 (L) 36.0 - 46.0 %   MCV 92.9 78.0 - 100.0 fL   MCH 30.1 26.0 - 34.0 pg   MCHC 32.4 30.0 - 36.0 g/dL   RDW 12.4 11.5 - 15.5 %   Platelets 178 150 - 400 K/uL  CBC  Result Value Ref Range   WBC 9.8 4.0 - 10.5 K/uL   RBC 3.11 (L) 3.87 - 5.11 MIL/uL   Hemoglobin 9.2 (L) 12.0 - 15.0 g/dL   HCT 29.7 (L) 36.0 - 46.0 %   MCV 95.5 78.0 - 100.0 fL   MCH 29.6 26.0 - 34.0 pg   MCHC 31.0 30.0 - 36.0 g/dL   RDW 12.7 11.5 - 15.5 %   Platelets 151 150 - 400 K/uL  CBC  Result Value Ref Range   WBC 7.5 4.0 - 10.5 K/uL   RBC 3.18 (L) 3.87 - 5.11 MIL/uL   Hemoglobin 9.6 (L) 12.0 - 15.0  g/dL   HCT 30.1 (L) 36.0 - 46.0 %   MCV 94.7 78.0 - 100.0 fL   MCH 30.2 26.0 - 34.0 pg   MCHC 31.9 30.0 - 36.0 g/dL   RDW 12.4 11.5 - 15.5 %   Platelets 161 150 - 400 K/uL  Basic metabolic panel  Result Value Ref Range   Sodium 141 137 - 147 mEq/L   Potassium 3.7 3.7 - 5.3 mEq/L   Chloride 102 96 - 112 mEq/L   CO2 28 19 - 32 mEq/L   Glucose, Bld 107 (H) 70 - 99 mg/dL   BUN 9 6 - 23 mg/dL   Creatinine, Ser 0.87 0.50 - 1.10 mg/dL   Calcium 9.4 8.4 - 10.5 mg/dL   GFR calc non Af Amer 67 (L) >90 mL/min   GFR calc Af Amer 78 (L) >90 mL/min   Anion gap 11 5 - 15  Type and screen  Result Value Ref Range   ABO/RH(D) O POS    Antibody Screen NEG    Sample Expiration 09/18/2014   ABO/Rh  Result Value Ref Range   ABO/RH(D) O POS     No results found.  Discharge Exam:  Filed Vitals:   09/21/14 0517  BP: 124/55  Pulse: 77  Temp: 98.7 F (37.1 C)  Resp: 18     General: WN WF who is alert and generally healthy appearing.  Lungs: Clear to auscultation and symmetric breath sounds. Heart:  RRR. No murmur or rub. Abdomen: Soft.  Normal bowel sounds.   Incisions look good.  Will remove some of the staples.  Discharge Medications:     Medication List    TAKE these medications        acetaminophen 325 MG tablet  Commonly known as:  TYLENOL  Take 325 mg by mouth every 6 (six) hours as needed for moderate pain or headache.     b complex vitamins tablet  Take 1 tablet by mouth at bedtime as needed.     calcium carbonate 500 MG chewable tablet  Commonly known as:  TUMS - dosed in mg elemental calcium  Chew 1 tablet by mouth daily as needed for indigestion or heartburn.     calcium-vitamin D 500-200 MG-UNIT per tablet  Commonly known as:  OSCAL WITH D  Take 1 tablet by mouth 2 (two) times a week. At night.     dexamethasone 4 MG tablet  Commonly known as:  DECADRON  Take 2 tablets (8 mg total) by mouth 2 (two) times daily with a meal. Start the day after chemotherapy for 3 days.     HYDROcodone-acetaminophen 5-325 MG per tablet  Commonly known as:  NORCO/VICODIN  Take 1-2 tablets by mouth every 4 (four) hours as needed for moderate pain.     ibuprofen 200 MG tablet  Commonly known as:  ADVIL,MOTRIN  Take 200 mg by mouth every 6 (six) hours as needed.     lidocaine-prilocaine cream  Commonly known as:  EMLA  Apply topically as needed.     loratadine 10 MG tablet  Commonly known as:  CLARITIN  Take 10 mg by mouth daily as needed for allergies. At night.     omeprazole 20 MG capsule  Commonly known as:  PRILOSEC  Take 20 mg by mouth 2 (two) times daily before a meal.     ondansetron 8 MG tablet  Commonly known as:  ZOFRAN  Take 1 tablet (8 mg total) by mouth 2 (two) times daily. Start  the day after chemo for 3 days. Then take as needed for nausea or vomiting.     polyethylene glycol packet  Commonly known as:  MIRALAX /  GLYCOLAX  Take 17 g by mouth daily as needed for mild constipation.     telmisartan 40 MG tablet  Commonly known as:  MICARDIS  Take 40 mg by mouth at bedtime.        Disposition: 01-Home or Self Care      Discharge Instructions    Diet - low sodium heart healthy    Complete by:  As directed      Increase activity slowly    Complete by:  As directed           Activity:  Driving - May drive in 3 or 4 days, if doing well   Lifting - No lifting more than 15 pounds for 3 weeks, then no limit  Wound Care:   May Shower  Diet:  Regular diet  Follow up appointment:  Call Dr. Pollie Friar office [facility]) at [phone] for an appointment in next week for staple removal.  Then in 2 to 3 weeks to see Dr. Lucia Gaskins  Medications and dosages:  Resume your home medications.  You have a prescription for:  Vicodin  Signed: Alphonsa Overall, M.D., [facility] Office:  [phone]  09/21/2014, 8:00 AM

## 2014-09-21 NOTE — Progress Notes (Signed)
Staples removed from port sites. Removed every other staple midline and applied steri strips.

## 2014-09-21 NOTE — Discharge Instructions (Signed)
[facility] - DISCHARGE INSTRUCTIONS TO PATIENT  Activity:  Driving - May drive in 3 or 4 days, if doing well   Lifting - No lifting more than 15 pounds for 3 weeks, then no limit  Wound Care:   May Shower  Diet:  Regular diet  Follow up appointment:  Call Dr. Pollie Friar office [facility]) at [phone] for an appointment in next week for staple removal.  Then in 2 to 3 weeks to see Dr. Lucia Gaskins  Medications and dosages:  Resume your home medications.  You have a prescription for:  Vicodin  Call Dr. Lucia Gaskins or his office  [phone]) if you have:  Temperature greater than 100.4,  Persistent nausea and vomiting,  Severe uncontrolled pain,  Redness, tenderness, or signs of infection (pain, swelling, redness, odor or green/yellow discharge around the site),  Difficulty breathing, headache or visual disturbances,  Any other questions or concerns you may have after discharge.  In an emergency, call 911 or go to an Emergency Department at a nearby hospital.

## 2014-10-02 ENCOUNTER — Other Ambulatory Visit: Payer: Medicare Other

## 2014-10-02 ENCOUNTER — Ambulatory Visit: Payer: Medicare Other | Admitting: Oncology

## 2014-10-11 ENCOUNTER — Other Ambulatory Visit: Payer: Medicare Other

## 2014-10-11 ENCOUNTER — Ambulatory Visit: Payer: Medicare Other | Admitting: Oncology

## 2014-10-13 ENCOUNTER — Ambulatory Visit (HOSPITAL_BASED_OUTPATIENT_CLINIC_OR_DEPARTMENT_OTHER): Payer: Medicare Other | Admitting: Oncology

## 2014-10-13 ENCOUNTER — Telehealth: Payer: Self-pay | Admitting: Oncology

## 2014-10-13 ENCOUNTER — Other Ambulatory Visit (HOSPITAL_BASED_OUTPATIENT_CLINIC_OR_DEPARTMENT_OTHER): Payer: Medicare Other

## 2014-10-13 VITALS — BP 115/91 | HR 69 | Temp 98.3°F | Resp 18 | Ht 66.0 in | Wt 187.7 lb

## 2014-10-13 DIAGNOSIS — C50511 Malignant neoplasm of lower-outer quadrant of right female breast: Secondary | ICD-10-CM

## 2014-10-13 DIAGNOSIS — Z170 Estrogen receptor positive status [ER+]: Secondary | ICD-10-CM

## 2014-10-13 DIAGNOSIS — K631 Perforation of intestine (nontraumatic): Secondary | ICD-10-CM

## 2014-10-13 DIAGNOSIS — Z9889 Other specified postprocedural states: Secondary | ICD-10-CM

## 2014-10-13 LAB — CBC WITH DIFFERENTIAL/PLATELET
BASO%: 0.2 % (ref 0.0–2.0)
Basophils Absolute: 0 10*3/uL (ref 0.0–0.1)
EOS%: 4.5 % (ref 0.0–7.0)
Eosinophils Absolute: 0.2 10*3/uL (ref 0.0–0.5)
HCT: 34.2 % — ABNORMAL LOW (ref 34.8–46.6)
HEMOGLOBIN: 11 g/dL — AB (ref 11.6–15.9)
LYMPH%: 22.4 % (ref 14.0–49.7)
MCH: 29.5 pg (ref 25.1–34.0)
MCHC: 32.2 g/dL (ref 31.5–36.0)
MCV: 91.7 fL (ref 79.5–101.0)
MONO#: 0.5 10*3/uL (ref 0.1–0.9)
MONO%: 12.1 % (ref 0.0–14.0)
NEUT%: 60.8 % (ref 38.4–76.8)
NEUTROS ABS: 2.7 10*3/uL (ref 1.5–6.5)
Platelets: 164 10*3/uL (ref 145–400)
RBC: 3.73 10*6/uL (ref 3.70–5.45)
RDW: 13.2 % (ref 11.2–14.5)
WBC: 4.5 10*3/uL (ref 3.9–10.3)
lymph#: 1 10*3/uL (ref 0.9–3.3)

## 2014-10-13 LAB — COMPREHENSIVE METABOLIC PANEL (CC13)
ALBUMIN: 3.6 g/dL (ref 3.5–5.0)
ALT: 31 U/L (ref 0–55)
AST: 16 U/L (ref 5–34)
Alkaline Phosphatase: 105 U/L (ref 40–150)
Anion Gap: 9 mEq/L (ref 3–11)
BILIRUBIN TOTAL: 0.35 mg/dL (ref 0.20–1.20)
BUN: 19.6 mg/dL (ref 7.0–26.0)
CHLORIDE: 106 meq/L (ref 98–109)
CO2: 26 mEq/L (ref 22–29)
Calcium: 9.2 mg/dL (ref 8.4–10.4)
Creatinine: 1 mg/dL (ref 0.6–1.1)
EGFR: 60 mL/min/{1.73_m2} — AB (ref >90)
Glucose: 92 mg/dl (ref 70–140)
Potassium: 4.1 mEq/L (ref 3.5–5.1)
SODIUM: 141 meq/L (ref 136–145)
TOTAL PROTEIN: 6.6 g/dL (ref 6.4–8.3)

## 2014-10-13 MED ORDER — TAMOXIFEN CITRATE 20 MG PO TABS: 20.0000 mg | ORAL_TABLET | Freq: Every day | ORAL | Status: AC

## 2014-10-13 NOTE — Progress Notes (Signed)
[place]  [phone] [phone]     ID: Martha Evans DOB: 05-27-1946  MR#: [id number]  [facility]#:[id number]  Patient Care Team: [name], MD as PCP - General (Family Medicine) Teodoro Spray, MD as Consulting Physician (Cardiology) Armandina Gemma, MD as Consulting Physician (General Surgery) Thea Silversmith, MD as Consulting Physician (Radiation Oncology) Chauncey Cruel, MD as Consulting Physician (Oncology) Armstead Peaks, MD as Consulting Physician  CHIEF COMPLAINT: Metaplastic breast cancer  CURRENT TREATMENT:  Tamoxifen  BREAST CANCER HISTORY: From a Dr. Laurelyn Sickle initial intake note 12/21/2013:  "Martha Evans is a 69 y.o. female. Would medical history significant for hypertension gastroesophageal reflux disease. Patient underwent bilateral breast reductions in the 1980s. In February 2015 she had screening mammograms performed that showed a mass in the central right breast. She had ultrasound performed which revealed a 6 x 4 x 6 mm high-grade calcifications. Biopsy of these calcifications revealed invasive ductal carcinoma. On 12/12/2013 patient underwent a lumpectomy without sentinel lymph node biopsy. 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 Tumor was ER +2% PR +2% HER-2/neu negative with an elevated Ki-67 of 84%. Of note patient [could not] undergo a sentinel lymph node biopsy because of previous breast reduction."  The patient was started on adjuvant chemotherapy with cyclophosphamide and docetaxel 01/16/2014, but treatment was interrupted by a perforated diverticulum requiring partial colectomy 02/13/2014.   Her subsequent history is as detailed below  INTERVAL HISTORY: Martha Evans returns today for follow up of her breast cancer. She underwent colostomy takedown in mid December.  The pathology from that procedure [[id number]] was benign. She had no significant postoperative problems.  REVIEW OF  SYSTEMS: She feels "almost back to normal". Her neuropathy has just about completely resolved. She still has minimal fatigue, but is walking about 3 times a week, about 15-20 minutes at a time.Marland Kitchen Her appetite is better. Overall a detailed review of systems was noncontributory  PAST MEDICAL HISTORY: Past Medical History  Diagnosis Date  . Hypertension   . Allergy   . Wears hearing aid     right  . GERD (gastroesophageal reflux disease)   . Complication of anesthesia     was hard to intubate with thyroid surgery-99  . Difficult intubation 1999    when had thyroid lobectomy  . Dysrhythmia     irregular reuglar heart beat   . Cancer     breast cancer right breast    PAST SURGICAL HISTORY: Past Surgical History  Procedure Laterality Date  . Thyroid lobectomy  1999    right  . Rotator cuff repair  2006    right shoulder  . Breast reduction surgery Bilateral     1980's  . Abdominal hysterectomy  1999  . Tonsillectomy      as child  . Appendectomy      as child  . Colonoscopy    . Partial mastectomy with needle localization and axillary sentinel lymph node bx Right 12/12/2013    Procedure: PARTIAL MASTECTOMY WITH NEEDLE LOCALIZATION AND AXILLARY SENTINEL LYMPH NODE BX;  Surgeon: Earnstine Regal, MD;  Location: [facility];  Service: General;  Laterality: Right;  . Portacath placement Left 01/02/2014    Procedure: INSERTION PORT-A-CATH;  Surgeon: Earnstine Regal, MD;  Location: [facility];  Service: General;  Laterality: Left;  . Laparotomy N/A 02/11/2014    Procedure: EXPLORATORY LAPAROTOMY, COLON RESECTION, COLOSTOMY;  Surgeon: Shann Medal, MD;  Location: [facility];  Service: General;  Laterality: N/A;  . Colonoscopy N/A 08/22/2014    Procedure: COLONOSCOPY;  Surgeon: Alphonsa Overall, MD;  Location: [facility];  Service: General;  Laterality: N/A;  . Colon resection N/A 09/15/2014    Procedure: Laparoscopic hand assisted colostomy reversal with resection of left colon and  laparoscopic lysis of adhesions ;  Surgeon: Alphonsa Overall, MD;  Location: [facility];  Service: General;  Laterality: N/A;    FAMILY HISTORY Family History  Problem Relation Age of Onset  . Cancer Father     bladder & pancreatic  The patient's father died from pancreatic cancer the age of 69. The patient's mother is alive, age 36, with significant Alzheimer's disease. The patient had one brother, no sisters. There are no first-degree relatives with breast or ovarian cancer.  GYNECOLOGIC HISTORY:  No LMP recorded. Patient has had a hysterectomy. Menarche age 76. The patient is GX P0. She underwent hysterectomy with bilateral salpingo-oophorectomy in 1999. She used an estrogen patch for several years after that.   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 Her husband, Martha Evans, works as a Armed forces logistics/support/administrative officer. He has 3 children from a prior marriage, living in [place], [place], and both work. The patient attends a local [employer]    ADVANCED DIRECTIVES: Not in place   HEALTH MAINTENANCE: History  Substance Use Topics  . Smoking status: Never Smoker   . Smokeless tobacco: Never Used  . Alcohol Use: Yes     Comment: seldom     Colonoscopy:  PAP: Status post hysterectomy  Bone density:  Lipid panel:  Allergies  Allergen Reactions  . Codeine Sulfate Nausea And Vomiting  . Tramadol Nausea Only    Nausea     Current Outpatient Prescriptions  Medication Sig Dispense Refill  . acetaminophen (TYLENOL) 325 MG tablet Take 325 mg by mouth every 6 (six) hours as needed for moderate pain or headache.    . b complex vitamins tablet Take 1 tablet by mouth at bedtime as needed.     . calcium carbonate (TUMS - DOSED IN MG ELEMENTAL CALCIUM) 500 MG chewable tablet Chew 1 tablet by mouth daily as needed for indigestion or heartburn.     . calcium-vitamin D (OSCAL WITH D) 500-200 MG-UNIT per tablet Take 1 tablet by mouth 2 (two) times a  week. At night.    . dexamethasone (DECADRON) 4 MG tablet Take 2 tablets (8 mg total) by mouth 2 (two) times daily with a meal. Start the day after chemotherapy for 3 days. (Patient not taking: Reported on 09/08/2014) 30 tablet 1  . HYDROcodone-acetaminophen (NORCO/VICODIN) 5-325 MG per tablet Take 1-2 tablets by mouth every 4 (four) hours as needed for moderate pain. 30 tablet 0  . ibuprofen (ADVIL,MOTRIN) 200 MG tablet Take 200 mg by mouth every 6 (six) hours as needed.    . lidocaine-prilocaine (EMLA) cream Apply topically as needed. 30 g 6  . loratadine (CLARITIN) 10 MG tablet Take 10 mg by mouth daily as needed for allergies. At night.    Marland Kitchen omeprazole (PRILOSEC) 20 MG capsule Take 20 mg by mouth 2 (two) times daily before a meal.     . ondansetron (ZOFRAN) 8 MG tablet Take 1 tablet (8 mg total) by mouth 2 (two) times daily. Start the day after chemo for 3 days. Then take as needed for nausea or vomiting. (Patient not taking: Reported on 09/08/2014) 30 tablet 1  . polyethylene glycol (  MIRALAX / GLYCOLAX) packet Take 17 g by mouth daily as needed for mild constipation.    Marland Kitchen telmisartan (MICARDIS) 40 MG tablet Take 40 mg by mouth at bedtime.      No current facility-administered medications for this visit.    OBJECTIVE: Middle-aged white woman who appears well Filed Vitals:   10/13/14 1135  BP: 115/91  Pulse: 69  Temp: 98.3 F (36.8 C)  Resp: 18     Body mass index is 30.31 kg/(m^2).    ECOG FS:0 - Asymptomatic  Sclerae unicteric, EOMs intact Oropharynx clear, teeth in good repair No cervical or supraclavicular adenopathy Lungs no rales or rhonchi Heart regular rate and rhythm Abd soft, nontender, positive bowel sounds MSK no focal spinal tenderness, no upper extremity lymphedema Neuro: nonfocal, well oriented, positive affect Breasts: The right breast is status post lumpectomy and radiation. There is no residual erythema. There are no masses or skin changes of concern. The right  axilla is benign per the left breast is unremarkable.  LAB RESULTS:  CMP     Component Value Date/Time   NA 141 09/18/2014 0440   NA 139 06/28/2014 1558   K 3.7 09/18/2014 0440   K 3.6 06/28/2014 1558   CL 102 09/18/2014 0440   CO2 28 09/18/2014 0440   CO2 27 06/28/2014 1558   GLUCOSE 107* 09/18/2014 0440   GLUCOSE 117 06/28/2014 1558   BUN 9 09/18/2014 0440   BUN 22.6 06/28/2014 1558   CREATININE 0.87 09/18/2014 0440   CREATININE 1.0 06/28/2014 1558   CALCIUM 9.4 09/18/2014 0440   CALCIUM 9.8 06/28/2014 1558   PROT 6.9 06/28/2014 1558   PROT 5.5* 02/15/2014 0329   ALBUMIN 3.7 06/28/2014 1558   ALBUMIN 2.2* 02/15/2014 0329   AST 16 06/28/2014 1558   AST 19 02/15/2014 0329   ALT 13 06/28/2014 1558   ALT 21 02/15/2014 0329   ALKPHOS 102 06/28/2014 1558   ALKPHOS 107 02/15/2014 0329   BILITOT 0.32 06/28/2014 1558   BILITOT 0.6 02/15/2014 0329   GFRNONAA 67* 09/18/2014 0440   GFRAA 78* 09/18/2014 0440    I No results found for: SPEP  Lab Results  Component Value Date   WBC 4.5 10/13/2014   NEUTROABS 2.7 10/13/2014   HGB 11.0* 10/13/2014   HCT 34.2* 10/13/2014   MCV 91.7 10/13/2014   PLT 164 10/13/2014      Chemistry      Component Value Date/Time   NA 141 09/18/2014 0440   NA 139 06/28/2014 1558   K 3.7 09/18/2014 0440   K 3.6 06/28/2014 1558   CL 102 09/18/2014 0440   CO2 28 09/18/2014 0440   CO2 27 06/28/2014 1558   BUN 9 09/18/2014 0440   BUN 22.6 06/28/2014 1558   CREATININE 0.87 09/18/2014 0440   CREATININE 1.0 06/28/2014 1558      Component Value Date/Time   CALCIUM 9.4 09/18/2014 0440   CALCIUM 9.8 06/28/2014 1558   ALKPHOS 102 06/28/2014 1558   ALKPHOS 107 02/15/2014 0329   AST 16 06/28/2014 1558   AST 19 02/15/2014 0329   ALT 13 06/28/2014 1558   ALT 21 02/15/2014 0329   BILITOT 0.32 06/28/2014 1558   BILITOT 0.6 02/15/2014 0329       No results found for: LABCA2  No components found for: [id number]  No results for input(s): INR  in the last 168 hours.  Urinalysis    Component Value Date/Time   COLORURINE YELLOW 02/17/2014 1639   APPEARANCEUR  CLOUDY* 02/17/2014 1639   LABSPEC 1.016 02/17/2014 1639   PHURINE 5.0 02/17/2014 1639   GLUCOSEU NEGATIVE 02/17/2014 1639   HGBUR NEGATIVE 02/17/2014 1639   BILIRUBINUR NEGATIVE 02/17/2014 1639   KETONESUR NEGATIVE 02/17/2014 1639   PROTEINUR 30* 02/17/2014 1639   UROBILINOGEN 1.0 02/17/2014 1639   NITRITE NEGATIVE 02/17/2014 1639   LEUKOCYTESUR NEGATIVE 02/17/2014 1639    STUDIES: No results found.   ASSESSMENT: 69 y.o. [place]  (1) status post right breast biopsy 12/01/2013 for an invasive ductal carcinoma, grade 2 or 3, estrogen receptor 2% "positive", progesterone receptor 2% "positive", both with moderate staining intensity, with an MIB-1 of 84%, and no HER-2 amplification  (2) status post right lumpectomy 12/12/2013 ([id number]) for a pT1b pNX invasive ductal carcinoma, metaplastic, high-grade, estrogen and progesterone receptor negative, repeat HER-2 again negative. Because of prior reduction mammoplasty sentinel lymph node sampling was not performed  (3) adjuvant chemotherapy with cyclophosphamide and docetaxel started 12/45/8099, complicated by peripheral neuropathy, and interrupted after 2 cycles because of diverticular perforation requiring partial colectomy (02/13/2014)  (4) completed 2 additional cycles of chemotherapy 05/22/2014, consisting of carboplatin given day 1 and gemcitabine days 1 and 8 of each 21 day cycle, with Neulasta on day 9.  (5) adjuvant radiation under Dr. Berton Mount followed chemotherapy  (6) tamoxifen started January 2016  (7) diverticulitis, status post partial colectomy with colostomy placement may 05/18/2014, status post colostomy takedown 09/15/2014  PLAN: Yocelin has now sufficiently recovered from her chemotherapy, radiation, and surgery for diverticular disease, that she can start anti-estrogens. As we have  discussed previously, while anti-estrogens in her case will marginally reduce the risk of recurrence, they will have more of an impact prophylactically, preventing her from developing another breast cancer in either breast.  We discussed the difference between tamoxifen and the aromatase inhibitors and differentiated both of them from the common symptoms of menopause. After much discussion we decided to go with tamoxifen and I am placing that prescription in for her today.  She will see me again in April. She will have her mammogram prior to that visit. If she is tolerating tamoxifen well at that point the plan will be for follow-up for an additional 5 years, with consideration of switching the tamoxifen to an aromatase inhibitor after 2 years.  Cicily has a good understanding of the overall plan. She agrees with it. She knows the goal of treatment in her case is cure. She will call with any problems that may develop before next visit here.   Chauncey Cruel, MD   10/13/2014 12:00 PM

## 2014-10-13 NOTE — Telephone Encounter (Signed)
per pof to sch pt appt-gave pt copy of sch °

## 2014-10-16 NOTE — Addendum Note (Signed)
Addended by: Laureen Abrahams on: 10/16/2014 05:59 PM   Modules accepted: Orders, Medications

## 2014-10-23 ENCOUNTER — Telehealth: Payer: Self-pay | Admitting: Oncology

## 2014-10-23 NOTE — Telephone Encounter (Signed)
due to bmdc moved 4/6 appt to AM. left message for pt and mailed schedule. other appts remain the same.

## 2014-10-25 ENCOUNTER — Other Ambulatory Visit: Payer: Self-pay | Admitting: Nurse Practitioner

## 2014-10-25 ENCOUNTER — Telehealth: Payer: Self-pay | Admitting: Nurse Practitioner

## 2014-10-25 ENCOUNTER — Telehealth: Payer: Self-pay | Admitting: *Deleted

## 2014-10-25 NOTE — Telephone Encounter (Signed)
Patient called reporting she started Tamoxifen 10-13-2014 and on 10-18-2014 noticed changes that have progressed.  "I am tired, sleeping 10 hours a day.  I my bones ache to my lower body.  Headache between my eyes, I don't know if I can be on this for two years." Will notify APP.  Can reach patient at [phone].

## 2014-10-25 NOTE — Telephone Encounter (Signed)
Patient believes she is unable to tolerate tamoxifen at this time. To hold drug at this time. Will follow up with myself in 4 weeks to discuss further options.

## 2014-10-26 ENCOUNTER — Telehealth: Payer: Self-pay | Admitting: Nurse Practitioner

## 2014-10-26 NOTE — Telephone Encounter (Signed)
, °

## 2014-11-22 ENCOUNTER — Ambulatory Visit (HOSPITAL_BASED_OUTPATIENT_CLINIC_OR_DEPARTMENT_OTHER): Payer: Medicare Other | Admitting: Nurse Practitioner

## 2014-11-22 ENCOUNTER — Other Ambulatory Visit (HOSPITAL_BASED_OUTPATIENT_CLINIC_OR_DEPARTMENT_OTHER): Payer: Medicare Other

## 2014-11-22 ENCOUNTER — Ambulatory Visit (HOSPITAL_BASED_OUTPATIENT_CLINIC_OR_DEPARTMENT_OTHER): Payer: Medicare Other

## 2014-11-22 ENCOUNTER — Telehealth: Payer: Self-pay | Admitting: Nurse Practitioner

## 2014-11-22 ENCOUNTER — Encounter: Payer: Self-pay | Admitting: Nurse Practitioner

## 2014-11-22 VITALS — BP 123/64 | HR 68 | Temp 98.4°F | Resp 18 | Ht 66.0 in | Wt 191.3 lb

## 2014-11-22 DIAGNOSIS — G8929 Other chronic pain: Secondary | ICD-10-CM | POA: Insufficient documentation

## 2014-11-22 DIAGNOSIS — Z95828 Presence of other vascular implants and grafts: Secondary | ICD-10-CM

## 2014-11-22 DIAGNOSIS — C50111 Malignant neoplasm of central portion of right female breast: Secondary | ICD-10-CM

## 2014-11-22 DIAGNOSIS — R232 Flushing: Secondary | ICD-10-CM

## 2014-11-22 DIAGNOSIS — C50511 Malignant neoplasm of lower-outer quadrant of right female breast: Secondary | ICD-10-CM

## 2014-11-22 DIAGNOSIS — Z452 Encounter for adjustment and management of vascular access device: Secondary | ICD-10-CM

## 2014-11-22 DIAGNOSIS — M2550 Pain in unspecified joint: Secondary | ICD-10-CM

## 2014-11-22 LAB — CBC WITH DIFFERENTIAL/PLATELET
BASO%: 0.7 % (ref 0.0–2.0)
Basophils Absolute: 0 10*3/uL (ref 0.0–0.1)
EOS%: 2.1 % (ref 0.0–7.0)
Eosinophils Absolute: 0.1 10*3/uL (ref 0.0–0.5)
HCT: 32.7 % — ABNORMAL LOW (ref 34.8–46.6)
HGB: 10.6 g/dL — ABNORMAL LOW (ref 11.6–15.9)
LYMPH#: 1 10*3/uL (ref 0.9–3.3)
LYMPH%: 17.7 % (ref 14.0–49.7)
MCH: 29.4 pg (ref 25.1–34.0)
MCHC: 32.5 g/dL (ref 31.5–36.0)
MCV: 90.6 fL (ref 79.5–101.0)
MONO#: 0.5 10*3/uL (ref 0.1–0.9)
MONO%: 8.3 % (ref 0.0–14.0)
NEUT#: 4.1 10*3/uL (ref 1.5–6.5)
NEUT%: 71.2 % (ref 38.4–76.8)
Platelets: 192 10*3/uL (ref 145–400)
RBC: 3.61 10*6/uL — AB (ref 3.70–5.45)
RDW: 13.8 % (ref 11.2–14.5)
WBC: 5.8 10*3/uL (ref 3.9–10.3)

## 2014-11-22 LAB — COMPREHENSIVE METABOLIC PANEL (CC13)
ALK PHOS: 99 U/L (ref 40–150)
ALT: 17 U/L (ref 0–55)
AST: 16 U/L (ref 5–34)
Albumin: 3.6 g/dL (ref 3.5–5.0)
Anion Gap: 11 mEq/L (ref 3–11)
BILIRUBIN TOTAL: 0.45 mg/dL (ref 0.20–1.20)
BUN: 19.8 mg/dL (ref 7.0–26.0)
CHLORIDE: 106 meq/L (ref 98–109)
CO2: 24 mEq/L (ref 22–29)
CREATININE: 0.9 mg/dL (ref 0.6–1.1)
Calcium: 9.2 mg/dL (ref 8.4–10.4)
EGFR: 63 mL/min/{1.73_m2} — ABNORMAL LOW (ref >90)
Glucose: 105 mg/dl (ref 70–140)
Potassium: 4 mEq/L (ref 3.5–5.1)
Sodium: 141 mEq/L (ref 136–145)
Total Protein: 6.5 g/dL (ref 6.4–8.3)

## 2014-11-22 MED ORDER — GABAPENTIN 300 MG PO CAPS: 300.0000 mg | ORAL_CAPSULE | Freq: Every day | ORAL | Status: DC

## 2014-11-22 MED ORDER — HEPARIN SOD (PORK) LOCK FLUSH 100 UNIT/ML IV SOLN
500.0000 [IU] | Freq: Once | INTRAVENOUS | Status: AC
Start: 2014-11-22 — End: 2014-11-22
  Administered 2014-11-22: 500 [IU] via INTRAVENOUS
  Filled 2014-11-22: qty 5

## 2014-11-22 MED ORDER — SODIUM CHLORIDE 0.9 % IJ SOLN
10.0000 mL | INTRAMUSCULAR | Status: DC | PRN
  Administered 2014-11-22: 10 mL via INTRAVENOUS
  Filled 2014-11-22: qty 10

## 2014-11-22 NOTE — Progress Notes (Signed)
[name]  [phone] [phone]     ID: LETECIA Evans DOB: [birth year]  MR#: [id number]  HGD#:[id number]  Patient Care Team: [name], MD as PCP - General (Family Medicine) Teodoro Spray, MD as Consulting Physician (Cardiology) Armandina Gemma, MD as Consulting Physician (General Surgery) Thea Silversmith, MD as Consulting Physician (Radiation Oncology) Chauncey Cruel, MD as Consulting Physician (Oncology) Noreene Filbert, MD as Consulting Physician  CHIEF COMPLAINT: Metaplastic breast cancer  CURRENT TREATMENT:  Tamoxifen  BREAST CANCER HISTORY: From a Dr. Laurelyn Sickle initial intake note 12/21/2013:  "Martha Evans is a 69 y.o. female. Would medical history significant for hypertension gastroesophageal reflux disease. Patient underwent bilateral breast reductions in the 1980s. In February 2015 she had screening mammograms performed that showed a mass in the central right breast. She had ultrasound performed which revealed a 6 x 4 x 6 mm high-grade calcifications. Biopsy of these calcifications revealed invasive ductal carcinoma. On 12/12/2013 patient underwent a lumpectomy without sentinel lymph node biopsy. 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 Tumor was ER +2% PR +2% HER-2/neu negative with an elevated Ki-67 of 84%. Of note patient [could not] undergo a sentinel lymph node biopsy because of previous breast reduction."  The patient was started on adjuvant chemotherapy with cyclophosphamide and docetaxel 01/16/2014, but treatment was interrupted by a perforated diverticulum requiring partial colectomy 02/13/2014.   Her subsequent history is as detailed below  INTERVAL HISTORY: Maame returns today for follow up of her breast cancer. She started tamoxifen last month but after 2 weeks she stopped the drug with complaints of joint aches, hot flashes, and headaches. The symptoms got better somewhat after  being off of the drug, but they are not completely resolved. She continues to have hot flashes at night, and her bilateral wrists and right shoulder are still stiff. She takes ibuprofen PRN at night to help her ignore the discomfort. She has had no headaches. The interval history is otherwise unremarkable.  REVIEW OF SYSTEMS: Braylie denies fevers, chills, nausea, vomiting, or changes in bowel or bladder habits. 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 She continues to be fatigued, likely from radiation that was completed late last year. She denies shortness of breath, chest pain, cough, or palpitations. A detailed review of systems is otherwise stable.  PAST MEDICAL HISTORY: Past Medical History  Diagnosis Date  . Hypertension   . Allergy   . Wears hearing aid     right  . GERD (gastroesophageal reflux disease)   . Complication of anesthesia     was hard to intubate with thyroid surgery-99  . Difficult intubation 1999    when had thyroid lobectomy  . Dysrhythmia     irregular reuglar heart beat   . Cancer     breast cancer right breast    PAST SURGICAL HISTORY: Past Surgical History  Procedure Laterality Date  . Thyroid lobectomy  1999    right  . Rotator cuff repair  2006    right shoulder  . Breast reduction surgery Bilateral     1980's  . Abdominal hysterectomy  1999  . Tonsillectomy      as child  . Appendectomy      as child  . Colonoscopy    . Partial mastectomy with needle localization and axillary sentinel lymph node bx Right 12/12/2013    Procedure: PARTIAL MASTECTOMY WITH NEEDLE LOCALIZATION AND AXILLARY SENTINEL LYMPH NODE BX;  Surgeon: Earnstine Regal,  MD;  Location: [facility];  Service: General;  Laterality: Right;  . Portacath placement Left 01/02/2014    Procedure: INSERTION PORT-A-CATH;  Surgeon: Earnstine Regal, MD;  Location: [facility];  Service: General;  Laterality: Left;  . Laparotomy N/A 02/11/2014    Procedure:  EXPLORATORY LAPAROTOMY, COLON RESECTION, COLOSTOMY;  Surgeon: Shann Medal, MD;  Location: [facility];  Service: General;  Laterality: N/A;  . Colonoscopy N/A 08/22/2014    Procedure: COLONOSCOPY;  Surgeon: Alphonsa Overall, MD;  Location: [facility];  Service: General;  Laterality: N/A;  . Colon resection N/A 09/15/2014    Procedure: Laparoscopic hand assisted colostomy reversal with resection of left colon and laparoscopic lysis of adhesions ;  Surgeon: Alphonsa Overall, MD;  Location: [facility];  Service: General;  Laterality: N/A;    FAMILY HISTORY Family History  Problem Relation Age of Onset  . Cancer Father     bladder & pancreatic  The patient's father died from pancreatic cancer the age of 63. The patient's mother is alive, age 4, with significant Alzheimer's disease. The patient had one brother, no sisters. There are no first-degree relatives with breast or ovarian cancer.  GYNECOLOGIC HISTORY:  No LMP recorded. Patient has had a hysterectomy. Menarche age 61. The patient is GX P0. She underwent hysterectomy with bilateral salpingo-oophorectomy in 1999. She used an estrogen patch for several years after that.   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 Her husband, Martha Evans, works as a Armed forces logistics/support/administrative officer. He has 3 children from a prior marriage, living in [place], [place], and both work. The patient attends a local [employer]    ADVANCED DIRECTIVES: Not in place   HEALTH MAINTENANCE: History  Substance Use Topics  . Smoking status: Never Smoker   . Smokeless tobacco: Never Used  . Alcohol Use: Yes     Comment: seldom     Colonoscopy:  PAP: Status post hysterectomy  Bone density:  Lipid panel:  Allergies  Allergen Reactions  . Codeine Sulfate Nausea And Vomiting  . Tramadol Nausea Only    Nausea     Current Outpatient Prescriptions  Medication Sig Dispense Refill  . b complex vitamins tablet Take 1 tablet by  mouth at bedtime as needed.     . calcium carbonate (TUMS - DOSED IN MG ELEMENTAL CALCIUM) 500 MG chewable tablet Chew 1 tablet by mouth daily as needed for indigestion or heartburn.     Marland Kitchen omeprazole (PRILOSEC) 20 MG capsule Take 20 mg by mouth 2 (two) times daily before a meal.     . telmisartan (MICARDIS) 40 MG tablet Take 40 mg by mouth at bedtime.     . gabapentin (NEURONTIN) 300 MG capsule Take 1 capsule (300 mg total) by mouth at bedtime. 90 capsule 3  . lidocaine-prilocaine (EMLA) cream Apply topically as needed. (Patient not taking: Reported on 11/22/2014) 30 g 6   No current facility-administered medications for this visit.    OBJECTIVE: Middle-aged white woman who appears well Filed Vitals:   11/22/14 1337  BP: 123/64  Pulse: 68  Temp: 98.4 F (36.9 C)  Resp: 18     Body mass index is 30.89 kg/(m^2).    ECOG FS:0 - Asymptomatic  Skin: warm, dry  HEENT: sclerae anicteric, conjunctivae pink, oropharynx clear. No thrush or mucositis.  Lymph Nodes: No cervical or supraclavicular lymphadenopathy  Lungs: clear to auscultation bilaterally, no rales, wheezes, or rhonci  Heart:  regular rate and rhythm  Abdomen: round, soft, non tender, positive bowel sounds  Musculoskeletal: No focal spinal tenderness, no peripheral edema  Neuro: non focal, well oriented, positive affect  Breasts: deferred  LAB RESULTS:  CMP     Component Value Date/Time   NA 141 11/22/2014 1303   NA 141 09/18/2014 0440   K 4.0 11/22/2014 1303   K 3.7 09/18/2014 0440   CL 102 09/18/2014 0440   CO2 24 11/22/2014 1303   CO2 28 09/18/2014 0440   GLUCOSE 105 11/22/2014 1303   GLUCOSE 107* 09/18/2014 0440   BUN 19.8 11/22/2014 1303   BUN 9 09/18/2014 0440   CREATININE 0.9 11/22/2014 1303   CREATININE 0.87 09/18/2014 0440   CALCIUM 9.2 11/22/2014 1303   CALCIUM 9.4 09/18/2014 0440   PROT 6.5 11/22/2014 1303   PROT 5.5* 02/15/2014 0329   ALBUMIN 3.6 11/22/2014 1303   ALBUMIN 2.2* 02/15/2014 0329   AST  16 11/22/2014 1303   AST 19 02/15/2014 0329   ALT 17 11/22/2014 1303   ALT 21 02/15/2014 0329   ALKPHOS 99 11/22/2014 1303   ALKPHOS 107 02/15/2014 0329   BILITOT 0.45 11/22/2014 1303   BILITOT 0.6 02/15/2014 0329   GFRNONAA 67* 09/18/2014 0440   GFRAA 78* 09/18/2014 0440    I No results found for: SPEP  Lab Results  Component Value Date   WBC 5.8 11/22/2014   NEUTROABS 4.1 11/22/2014   HGB 10.6* 11/22/2014   HCT 32.7* 11/22/2014   MCV 90.6 11/22/2014   PLT 192 11/22/2014      Chemistry      Component Value Date/Time   NA 141 11/22/2014 1303   NA 141 09/18/2014 0440   K 4.0 11/22/2014 1303   K 3.7 09/18/2014 0440   CL 102 09/18/2014 0440   CO2 24 11/22/2014 1303   CO2 28 09/18/2014 0440   BUN 19.8 11/22/2014 1303   BUN 9 09/18/2014 0440   CREATININE 0.9 11/22/2014 1303   CREATININE 0.87 09/18/2014 0440      Component Value Date/Time   CALCIUM 9.2 11/22/2014 1303   CALCIUM 9.4 09/18/2014 0440   ALKPHOS 99 11/22/2014 1303   ALKPHOS 107 02/15/2014 0329   AST 16 11/22/2014 1303   AST 19 02/15/2014 0329   ALT 17 11/22/2014 1303   ALT 21 02/15/2014 0329   BILITOT 0.45 11/22/2014 1303   BILITOT 0.6 02/15/2014 0329       No results found for: LABCA2  No components found for: LABCA125  No results for input(s): INR in the last 168 hours.  Urinalysis    Component Value Date/Time   COLORURINE YELLOW 02/17/2014 1639   APPEARANCEUR CLOUDY* 02/17/2014 1639   LABSPEC 1.016 02/17/2014 1639   PHURINE 5.0 02/17/2014 1639   GLUCOSEU NEGATIVE 02/17/2014 1639   HGBUR NEGATIVE 02/17/2014 1639   BILIRUBINUR NEGATIVE 02/17/2014 1639   KETONESUR NEGATIVE 02/17/2014 1639   PROTEINUR 30* 02/17/2014 1639   UROBILINOGEN 1.0 02/17/2014 1639   NITRITE NEGATIVE 02/17/2014 1639   LEUKOCYTESUR NEGATIVE 02/17/2014 1639    STUDIES: No results found.   ASSESSMENT: 69 y.o. [place], [place] woman  (1) status post right breast biopsy 12/01/2013 for an invasive ductal carcinoma,  grade 2 or 3, estrogen receptor 2% "positive", progesterone receptor 2% "positive", both with moderate staining intensity, with an MIB-1 of 84%, and no HER-2 amplification  (2) status post right lumpectomy 12/12/2013 ([id number]) for a pT1b pNX invasive ductal carcinoma, metaplastic, high-grade, estrogen and progesterone receptor negative, repeat  HER-2 again negative. Because of prior reduction mammoplasty sentinel lymph node sampling was not performed  (3) adjuvant chemotherapy with cyclophosphamide and docetaxel started 16/06/9603, complicated by peripheral neuropathy, and interrupted after 2 cycles because of diverticular perforation requiring partial colectomy (02/13/2014)  (4) completed 2 additional cycles of chemotherapy 05/22/2014, consisting of carboplatin given day 1 and gemcitabine days 1 and 8 of each 21 day cycle, with Neulasta on day 9.  (5) adjuvant radiation under Dr. Berton Mount followed chemotherapy  (6) tamoxifen started January 2016 stopped 2 weeks into drug. To restart in February 2016  (7) diverticulitis, status post partial colectomy with colostomy placement may 05/18/2014, status post colostomy takedown 09/15/2014  PLAN: Marlowe Kays and I spent about 25 minutes discussing her symptoms with tamoxifen and antiestrogen therapy in general. She is unsure if it were truly tamoxifen causing all of the issues, or if she simply had a bad week seeing as how she was on it for an exceptionally short period of time. She is willing to restart it with a few adjustments. I have prescribed her 31m gabapentin QHS for her hot flashes and she has tramadol left over from a previous surgery to try for her joint aches.   CShandrekawill retry the tamoxifen for 2 weeks and will call u[place]agood or bad, with how she is feeling. If she does not tolerate it well again, she will take a 4 week break before beginning an aromatase inhibitor. CDarriaunderstands and agrees with this plan. She knows the goal of  treatment in her case is cure. She has been encouraged to call with any issues that might arise before her next visit here.   FMarcelino Duster NP   11/22/2014 2:34 PM

## 2014-11-22 NOTE — Telephone Encounter (Signed)
pt req a copy of sch-printed and gave pt copy

## 2014-11-27 ENCOUNTER — Other Ambulatory Visit: Payer: Self-pay | Admitting: Oncology

## 2014-11-27 DIAGNOSIS — Z853 Personal history of malignant neoplasm of breast: Secondary | ICD-10-CM

## 2014-11-27 DIAGNOSIS — Z9889 Other specified postprocedural states: Secondary | ICD-10-CM

## 2014-12-22 ENCOUNTER — Telehealth: Payer: Self-pay | Admitting: Oncology

## 2014-12-22 ENCOUNTER — Telehealth: Payer: Self-pay | Admitting: *Deleted

## 2014-12-22 ENCOUNTER — Other Ambulatory Visit: Payer: Self-pay | Admitting: *Deleted

## 2014-12-22 MED ORDER — TAMOXIFEN CITRATE 20 MG PO TABS: 20.0000 mg | ORAL_TABLET | Freq: Every day | ORAL | Status: DC

## 2014-12-22 NOTE — Telephone Encounter (Signed)
Ok to refill. I asked her to call to make sure she was tolerating it.

## 2014-12-22 NOTE — Telephone Encounter (Signed)
Called and spoke with patient and she is aware of her new appointment 3/29

## 2014-12-22 NOTE — Telephone Encounter (Signed)
Pt called to inform us that she is tolerating Tamoxifen so far and would like [place] to call her in some refills. Pt states she is having some arthritic pain but working through it. Message to be forwarded to [facility]

## 2014-12-26 ENCOUNTER — Other Ambulatory Visit (HOSPITAL_BASED_OUTPATIENT_CLINIC_OR_DEPARTMENT_OTHER): Payer: Medicare Other

## 2014-12-26 DIAGNOSIS — C50511 Malignant neoplasm of lower-outer quadrant of right female breast: Secondary | ICD-10-CM | POA: Diagnosis not present

## 2014-12-26 LAB — CBC WITH DIFFERENTIAL/PLATELET
BASO%: 0.7 % (ref 0.0–2.0)
BASOS ABS: 0 10*3/uL (ref 0.0–0.1)
EOS%: 3.2 % (ref 0.0–7.0)
Eosinophils Absolute: 0.2 10*3/uL (ref 0.0–0.5)
HCT: 34.5 % — ABNORMAL LOW (ref 34.8–46.6)
HEMOGLOBIN: 11.2 g/dL — AB (ref 11.6–15.9)
LYMPH#: 1 10*3/uL (ref 0.9–3.3)
LYMPH%: 18.8 % (ref 14.0–49.7)
MCH: 29.5 pg (ref 25.1–34.0)
MCHC: 32.4 g/dL (ref 31.5–36.0)
MCV: 90.8 fL (ref 79.5–101.0)
MONO#: 0.6 10*3/uL (ref 0.1–0.9)
MONO%: 10.1 % (ref 0.0–14.0)
NEUT#: 3.7 10*3/uL (ref 1.5–6.5)
NEUT%: 67.2 % (ref 38.4–76.8)
Platelets: 179 10*3/uL (ref 145–400)
RBC: 3.8 10*6/uL (ref 3.70–5.45)
RDW: 13.6 % (ref 11.2–14.5)
WBC: 5.5 10*3/uL (ref 3.9–10.3)

## 2014-12-26 LAB — COMPREHENSIVE METABOLIC PANEL (CC13)
ALBUMIN: 3.6 g/dL (ref 3.5–5.0)
ALK PHOS: 97 U/L (ref 40–150)
ALT: 28 U/L (ref 0–55)
ANION GAP: 8 meq/L (ref 3–11)
AST: 17 U/L (ref 5–34)
BUN: 23 mg/dL (ref 7.0–26.0)
CO2: 25 mEq/L (ref 22–29)
Calcium: 9.2 mg/dL (ref 8.4–10.4)
Chloride: 108 mEq/L (ref 98–109)
Creatinine: 1 mg/dL (ref 0.6–1.1)
EGFR: 62 mL/min/{1.73_m2} — AB (ref >90)
Glucose: 95 mg/dl (ref 70–140)
Potassium: 3.9 mEq/L (ref 3.5–5.1)
SODIUM: 141 meq/L (ref 136–145)
Total Bilirubin: 0.46 mg/dL (ref 0.20–1.20)
Total Protein: 6.6 g/dL (ref 6.4–8.3)

## 2014-12-27 ENCOUNTER — Other Ambulatory Visit: Payer: Medicare Other

## 2015-01-03 ENCOUNTER — Ambulatory Visit: Payer: Self-pay | Admitting: Surgery

## 2015-01-03 ENCOUNTER — Ambulatory Visit (HOSPITAL_BASED_OUTPATIENT_CLINIC_OR_DEPARTMENT_OTHER): Payer: Medicare Other | Admitting: Oncology

## 2015-01-03 VITALS — BP 114/84 | HR 90 | Temp 97.0°F | Resp 18 | Ht 66.0 in | Wt 191.2 lb

## 2015-01-03 DIAGNOSIS — E890 Postprocedural hypothyroidism: Secondary | ICD-10-CM

## 2015-01-03 DIAGNOSIS — Z170 Estrogen receptor positive status [ER+]: Secondary | ICD-10-CM

## 2015-01-03 DIAGNOSIS — Z9009 Acquired absence of other part of head and neck: Secondary | ICD-10-CM

## 2015-01-03 DIAGNOSIS — C50511 Malignant neoplasm of lower-outer quadrant of right female breast: Secondary | ICD-10-CM

## 2015-01-03 DIAGNOSIS — N179 Acute kidney failure, unspecified: Secondary | ICD-10-CM

## 2015-01-03 DIAGNOSIS — M2550 Pain in unspecified joint: Secondary | ICD-10-CM

## 2015-01-03 NOTE — Progress Notes (Signed)
[facility]  [phone] [phone]     ID: Martha Evans DOB: September 24, 1946  MR#: [id number]  GGE#:[id number]  Patient Care Team: Birdie Sons, MD as PCP - General (Family Medicine) Teodoro Spray, MD as Consulting Physician (Cardiology) Armandina Gemma, MD as Consulting Physician (General Surgery) Thea Silversmith, MD as Consulting Physician (Radiation Oncology) Chauncey Cruel, MD as Consulting Physician (Oncology) Noreene Filbert, MD as Consulting Physician  CHIEF COMPLAINT: Metaplastic breast cancer  CURRENT TREATMENT:  Tamoxifen  BREAST CANCER HISTORY: From a Dr. Laurelyn Sickle initial intake note 12/21/2013:  "Martha Evans is a 69 y.o. female. Would medical history significant for hypertension gastroesophageal reflux disease. Patient underwent bilateral breast reductions in the 1980s. In February 2015 she had screening mammograms performed that showed a mass in the central right breast. She had ultrasound performed which revealed a 6 x 4 x 6 mm high-grade calcifications. Biopsy of these calcifications revealed invasive ductal carcinoma. On 12/12/2013 patient underwent a lumpectomy without sentinel lymph node biopsy. 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 Tumor was ER +2% PR +2% HER-2/neu negative with an elevated Ki-67 of 84%. Of note patient [could not] undergo a sentinel lymph node biopsy because of previous breast reduction."  The patient was started on adjuvant chemotherapy with cyclophosphamide and docetaxel 01/16/2014, but treatment was interrupted by a perforated diverticulum requiring partial colectomy 02/13/2014.   Her subsequent history is as detailed below  INTERVAL HISTORY: Martha Evans returns today for follow up of her breast cancer. After being off it for a while she gave tamoxifen another try. She is doing much better with it. She does have some night sweats, but nighttime gabapentin is helpful with  that. She has had a very minimal vaginal dryness. She has some bony aches and arthralgias which she attributes to tamoxifen but which I think may not be related. She obtains a drug at approximately $5 per month. Marland Kitchen  REVIEW OF SYSTEMS: Martha Evans feels getting her colostomy taken down around Christmastime last year was a "great present". She has "less storage capacity" now so when she feels the urge she has to go but there have been no incontinence episodes. She has mild sinus symptoms related to the recent changes in season, and she takes an occasional antihistamine for that. She does a lot of walking at home and when she goes shopping, but does not exercise regularly. A detailed review of systems today was otherwise entirely benign.  PAST MEDICAL HISTORY: Past Medical History  Diagnosis Date  . Hypertension   . Allergy   . Wears hearing aid     right  . GERD (gastroesophageal reflux disease)   . Complication of anesthesia     was hard to intubate with thyroid surgery-99  . Difficult intubation 1999    when had thyroid lobectomy  . Dysrhythmia     irregular reuglar heart beat   . Cancer     breast cancer right breast    PAST SURGICAL HISTORY: Past Surgical History  Procedure Laterality Date  . Thyroid lobectomy  1999    right  . Rotator cuff repair  2006    right shoulder  . Breast reduction surgery Bilateral     1980's  . Abdominal hysterectomy  1999  . Tonsillectomy      as child  . Appendectomy      as child  . Colonoscopy    . Partial mastectomy with needle localization and axillary sentinel lymph  node bx Right 12/12/2013    Procedure: PARTIAL MASTECTOMY WITH NEEDLE LOCALIZATION AND AXILLARY SENTINEL LYMPH NODE BX;  Surgeon: Earnstine Regal, MD;  Location: [place];  Service: General;  Laterality: Right;  . Portacath placement Left 01/02/2014    Procedure: INSERTION PORT-A-CATH;  Surgeon: Earnstine Regal, MD;  Location: [facility];  Service: General;  Laterality: Left;    . Laparotomy N/A 02/11/2014    Procedure: EXPLORATORY LAPAROTOMY, COLON RESECTION, COLOSTOMY;  Surgeon: Shann Medal, MD;  Location: [facility];  Service: General;  Laterality: N/A;  . Colonoscopy N/A 08/22/2014    Procedure: COLONOSCOPY;  Surgeon: Alphonsa Overall, MD;  Location: [facility];  Service: General;  Laterality: N/A;  . Colon resection N/A 09/15/2014    Procedure: Laparoscopic hand assisted colostomy reversal with resection of left colon and laparoscopic lysis of adhesions ;  Surgeon: Alphonsa Overall, MD;  Location: [facility];  Service: General;  Laterality: N/A;    FAMILY HISTORY Family History  Problem Relation Age of Onset  . Cancer Father     bladder & pancreatic  The patient's father died from pancreatic cancer the age of 45. The patient's mother is alive, age 12, with significant Alzheimer's disease. The patient had one brother, no sisters. There are no first-degree relatives with breast or ovarian cancer.  GYNECOLOGIC HISTORY:  No LMP recorded. Patient has had a hysterectomy. Menarche age 55. The patient is GX P0. She underwent hysterectomy with bilateral salpingo-oophorectomy in 1999. She used an estrogen patch for several years after that.   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 Her husband, Lendon Collar, works as a Armed forces logistics/support/administrative officer. He has 3 children from a prior marriage, living in [place], [place], and both work. The patient attends a local [employer]    ADVANCED DIRECTIVES: Not in place   HEALTH MAINTENANCE: History  Substance Use Topics  . Smoking status: Never Smoker   . Smokeless tobacco: Never Used  . Alcohol Use: Yes     Comment: seldom     Colonoscopy:  PAP: Status post hysterectomy  Bone density:  Lipid panel:  Allergies  Allergen Reactions  . Codeine Sulfate Nausea And Vomiting  . Tramadol Nausea Only    Nausea     Current Outpatient Prescriptions  Medication Sig Dispense Refill  . b  complex vitamins tablet Take 1 tablet by mouth at bedtime as needed.     . calcium carbonate (TUMS - DOSED IN MG ELEMENTAL CALCIUM) 500 MG chewable tablet Chew 1 tablet by mouth daily as needed for indigestion or heartburn.     . gabapentin (NEURONTIN) 300 MG capsule Take 1 capsule (300 mg total) by mouth at bedtime. 90 capsule 3  . lidocaine-prilocaine (EMLA) cream Apply topically as needed. (Patient not taking: Reported on 11/22/2014) 30 g 6  . omeprazole (PRILOSEC) 20 MG capsule Take 20 mg by mouth 2 (two) times daily before a meal.     . tamoxifen (NOLVADEX) 20 MG tablet Take 1 tablet (20 mg total) by mouth daily. 90 tablet 2  . telmisartan (MICARDIS) 40 MG tablet Take 40 mg by mouth at bedtime.      No current facility-administered medications for this visit.    OBJECTIVE: Middle-aged white woman in no acute distress  Filed Vitals:   01/03/15 1009  BP: 114/84  Pulse: 90  Temp: 97 F (36.1 C)  Resp: 18     Body mass index is  30.88 kg/(m^2).    ECOG FS:0 - Asymptomatic  Sclerae unicteric, pupils round and equal Oropharynx clear and moist-- no thrush or other lesions No cervical or supraclavicular adenopathy Lungs no rales or rhonchi Heart regular rate and rhythm Abd soft, nontender, positive bowel sounds MSK no focal spinal tenderness, no upper extremity lymphedema Neuro: nonfocal, well oriented, appropriate affect Breasts: The right breast is status post lumpectomy and radiation. There is some induration in the inferior outer portion, a consistent with scar tissue. There is no evidence of local recurrence. The right axilla is benign. The left breast is status post reduction mammoplasty. There is no suspicious finding.   LAB RESULTS:  CMP     Component Value Date/Time   NA 141 12/26/2014 1034   NA 141 09/18/2014 0440   K 3.9 12/26/2014 1034   K 3.7 09/18/2014 0440   CL 102 09/18/2014 0440   CO2 25 12/26/2014 1034   CO2 28 09/18/2014 0440   GLUCOSE 95 12/26/2014 1034    GLUCOSE 107* 09/18/2014 0440   BUN 23.0 12/26/2014 1034   BUN 9 09/18/2014 0440   CREATININE 1.0 12/26/2014 1034   CREATININE 0.87 09/18/2014 0440   CALCIUM 9.2 12/26/2014 1034   CALCIUM 9.4 09/18/2014 0440   PROT 6.6 12/26/2014 1034   PROT 5.5* 02/15/2014 0329   ALBUMIN 3.6 12/26/2014 1034   ALBUMIN 2.2* 02/15/2014 0329   AST 17 12/26/2014 1034   AST 19 02/15/2014 0329   ALT 28 12/26/2014 1034   ALT 21 02/15/2014 0329   ALKPHOS 97 12/26/2014 1034   ALKPHOS 107 02/15/2014 0329   BILITOT 0.46 12/26/2014 1034   BILITOT 0.6 02/15/2014 0329   GFRNONAA 67* 09/18/2014 0440   GFRAA 78* 09/18/2014 0440    I No results found for: SPEP  Lab Results  Component Value Date   WBC 5.5 12/26/2014   NEUTROABS 3.7 12/26/2014   HGB 11.2* 12/26/2014   HCT 34.5* 12/26/2014   MCV 90.8 12/26/2014   PLT 179 12/26/2014      Chemistry      Component Value Date/Time   NA 141 12/26/2014 1034   NA 141 09/18/2014 0440   K 3.9 12/26/2014 1034   K 3.7 09/18/2014 0440   CL 102 09/18/2014 0440   CO2 25 12/26/2014 1034   CO2 28 09/18/2014 0440   BUN 23.0 12/26/2014 1034   BUN 9 09/18/2014 0440   CREATININE 1.0 12/26/2014 1034   CREATININE 0.87 09/18/2014 0440      Component Value Date/Time   CALCIUM 9.2 12/26/2014 1034   CALCIUM 9.4 09/18/2014 0440   ALKPHOS 97 12/26/2014 1034   ALKPHOS 107 02/15/2014 0329   AST 17 12/26/2014 1034   AST 19 02/15/2014 0329   ALT 28 12/26/2014 1034   ALT 21 02/15/2014 0329   BILITOT 0.46 12/26/2014 1034   BILITOT 0.6 02/15/2014 0329       No results found for: LABCA2  No components found for: LABCA125  No results for input(s): INR in the last 168 hours.  Urinalysis    Component Value Date/Time   COLORURINE YELLOW 02/17/2014 1639   APPEARANCEUR CLOUDY* 02/17/2014 1639   LABSPEC 1.016 02/17/2014 1639   PHURINE 5.0 02/17/2014 1639   GLUCOSEU NEGATIVE 02/17/2014 1639   HGBUR NEGATIVE 02/17/2014 1639   BILIRUBINUR NEGATIVE 02/17/2014 1639    KETONESUR NEGATIVE 02/17/2014 1639   PROTEINUR 30* 02/17/2014 1639   UROBILINOGEN 1.0 02/17/2014 1639   NITRITE NEGATIVE 02/17/2014 1639   LEUKOCYTESUR NEGATIVE 02/17/2014  1639    STUDIES: Mammography scheduled for April 2016  ASSESSMENT: 69 y.o. [place], [place] woman  (1) status post right breast biopsy 12/01/2013 for an invasive ductal carcinoma, grade 2 or 3, estrogen receptor 2% "positive", progesterone receptor 2% "positive", both with moderate staining intensity, with an MIB-1 of 84%, and no HER-2 amplification  (2) status post right lumpectomy 12/12/2013 ([id number]) for a pT1b pNX invasive ductal carcinoma, metaplastic, high-grade, estrogen and progesterone receptor negative, repeat HER-2 again negative. Because of prior reduction mammoplasty sentinel lymph node sampling was not performed  (3) adjuvant chemotherapy with cyclophosphamide and docetaxel started 88/50/2774, complicated by peripheral neuropathy, and interrupted after 2 cycles because of diverticular perforation requiring partial colectomy (02/13/2014)  (4) completed 2 additional cycles of chemotherapy 05/22/2014, consisting of carboplatin given day 1 and gemcitabine days 1 and 8 of each 21 day cycle, with Neulasta on day 9.  (5) adjuvant radiation under Dr. Berton Mount followed chemotherapy  (6) tamoxifen started January 2016 stopped 2 weeks into drug. To restart in February 2016  (7) diverticulitis, status post partial colectomy with colostomy placement may 05/18/2014, status post colostomy takedown 09/15/2014  PLAN: Martha Evans is doing much better on the tamoxifen, and though this may only be minimally helpful in terms of her breast cancer, it will at least be useful prophylactically in terms of her not developing estrogen receptor positive breast cancer in the future.  I reassured her that she can use gabapentin every night if she wishes as it is a very benign drug that she is taking at a very low dose and which is not  habit-forming. It is fine also to use it when necessary as she is doing.  I don't think the arthralgias and myalgias she is experiencing are going to be related to tamoxifen. Neither is the weight gain issue. I think she would do well with tai chi or yoga exercises and I gave her a copy of the [employer]. She will [facility] has this program and if it does not have it available I encouraged her to press for it, as it will be helpful for other patients as well as herself.  She will see Dr. jerking in the near future to have her port removed. She'll see her primary care physician in about 3 months, she tells me, for her yearly visit. Accordingly she will return to see [place] again in 6 months. She knows to call for any problems that may develop before her next visit here.     Chauncey Cruel, MD   01/03/2015 10:27 AM

## 2015-01-08 ENCOUNTER — Ambulatory Visit
Admission: RE | Admit: 2015-01-08 | Discharge: 2015-01-08 | Disposition: A | Discharge status: Home / Self Care | Payer: Medicare Other | Source: Ambulatory Visit | Attending: Oncology | Admitting: Oncology

## 2015-01-08 DIAGNOSIS — Z9889 Other specified postprocedural states: Secondary | ICD-10-CM

## 2015-01-08 DIAGNOSIS — Z853 Personal history of malignant neoplasm of breast: Secondary | ICD-10-CM

## 2015-01-20 NOTE — Consult Note (Signed)
Reason for Visit: This 69 year old Female patient presents to the clinic for initial evaluation of  breast cancer .   Referred by Dr. Jana Hakim.  Diagnosis:  Chief Complaint/Diagnosis   75 rolled female status post wide local excision for a stage I (T1 B. 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  Pathology Report pathology report reviewed   Imaging Report mammograms requested for my review   Referral Report clinical notes reviewed   Planned Treatment Regimen right whole breast radiation   HPI   patient is a 69 year old female who presented with an abnormal mammogram of her right breastshowing a 6 x 4 x 6 mm high-grade calcifications in the central part of the right breast. Ultrasound-guided biopsy was positive for invasive ductal carcinoma. 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 Tumor was ER/PR positive HER-2/neu not over expressed. She has a history in the 1980s of bilateral breast reduction sentinel node biopsy cannot be performed based on his prior surgery. Based on the probability of progressive nature of metaplastic breast cancer she was treated with adjuvant chemotherapy Cytoxan and Taxol although developed a perforated diverticulum requiring partial colectomy and colostomy in May of 2015. She was able to complete her treatments with chemotherapy and has done fairly well and agents were switched to gemcitabine carboplatinum with Neulasta support. She is seen today for consideration of radiation therapy. She is doing well at this time. She specifically denies breast tenderness cough or bone pain.  Past Hx:    Breast Cancer:    Irregular Heart Beat:    Appendectomy:    Hysterectomy - Total:    Lumpectomy:   Past,  Family and Social History:  Past Medical History positive   Cardiovascular hypertension; irregular heartbeat   Gastrointestinal GERD   Past Surgical History appendectomy; hysterectomy, right thyroidectomy, rotator cuff repair, breast reduction surgery   Family History noncontributory   Additional Past Medical and Surgical History seen by herself tonight   Allergies:   No Known Allergies:   Home Meds:  Home Medications: Medication Instructions Status  Vitamin B complex 1 tab(s) orally once a day Active  calcium carbonate 500 mg oral tablet, chewable 1 tab(s) orally once a day Active  Calcium 500+D 500 mg-400 intl units oral tablet, chewable 1 tab(s) orally once a day Active  loratadine 10 mg oral tablet 1 tab(s) orally once a day Active  omeprazole 20 mg oral delayed release capsule 1 cap(s) orally once a day Active  MiraLax 1 dose(s) orally once a day, As Needed - for Constipation Active  telmisartan 40 mg oral tablet 1 tab(s) orally once a day Active   Review of Systems:  General negative   Performance Status (ECOG) 0   Skin negative   Breast see HPI   Ophthalmologic negative   ENMT negative   Respiratory and Thorax negative   Cardiovascular negative   Gastrointestinal negative   Genitourinary negative   Musculoskeletal negative   Neurological negative   Psychiatric negative   Hematology/Lymphatics negative   Endocrine negative   Allergic/Immunologic negative   Review of Systems   denies any weight loss, fatigue, weakness, fever, chills or night sweats. Patient denies any loss of vision, blurred vision. Patient denies any ringing  of the ears or hearing loss.  No irregular heartbeat. Patient denies heart murmur or history of fainting. Patient denies any chest pain or pain radiating to her upper extremities. Patient denies any shortness of breath, difficulty breathing at night, cough or hemoptysis. Patient denies any swelling in the lower legs. Patient  denies any nausea vomiting, vomiting of blood, or coffee ground material in the vomitus. Patient denies any stomach pain. Patient states has had normal bowel movements no significant constipation or diarrhea. Patient denies any dysuria, hematuria or significant nocturia. Patient denies any problems walking, swelling in the joints or loss of balance. Patient denies any skin changes, loss of hair or loss of weight. Patient denies any excessive worrying or anxiety or significant depression. Patient denies any problems with insomnia. Patient denies excessive thirst, polyuria, polydipsia. Patient denies any swollen glands, patient denies easy bruising or easy bleeding. Patient denies any recent infections, allergies or URI. Patient "s visual fields have not changed significantly in recent time.   Nursing Notes:  Nursing Vital Signs and Chemo Nursing Nursing Notes: *CC Vital Signs Flowsheet:   26-Aug-15 11:34  Temp Temperature 97  Pulse Pulse 91  Respirations Respirations 20  SBP SBP 116  DBP DBP 64  Pain Scale (0-10)  0  Current Weight (kg) (kg) 82.7   Physical Exam:  General/Skin/HEENT:  General normal   Skin normal   Eyes normal   ENMT normal   Head and Neck normal   Additional PE well-developed well-nourished female in NAD. Lungs are clear to A&P. She is a slightly irregular heartbeat dropping a beat every 30-60 seconds. She is status post bilateral breast reduction. She status post wide local excision of the right breast with incision healing well. No dominant mass or nodularity is noted in either breast in 2 positions examined. No axillary or supraclavicular adenopathy is noted.   Breasts/Resp/CV/GI/GU:  Respiratory and Thorax normal   Cardiovascular normal   Gastrointestinal normal   Genitourinary normal   MS/Neuro/Psych/Lymph:  Musculoskeletal normal   Neurological normal   Lymphatics normal   Other Results:  Radiology Results: [place]:    07-Oct-14 14:38, [place] Soft  Tissue Head/Neck/Thyroid  US Soft Tissue Head/Neck/Thyroid   REASON FOR EXAM:    throid fullness  COMMENTS:       PROCEDURE: KUS - KUS SOFT TISSUE HEAD/NECK/THYROI  - Jul 05 2013  2:38PM     RESULT: The patient is undergone previous right thyroid lobectomy. The   left thyroid lobe measures 5.96 x 2.86 x 2.62 cm. There are images   demonstrating a very heterogeneous appearance of the thyroid with   numerous hypo-, isoechoic and hyperechoic lesions seen. The largest area   is in the lower pole of the left thyroid lobe measuring 1.71 x 1.20 x   1.67 appears to be hypoechoic. Second lesion measured is slightly   superior to this area and more anterior in position measuring 0.85 x 0.56   x 1.11 cm. The third lesion is in the midportion of the left thyroid lobe   slightly more anteriorly measuring 0.56 x 0.40 x 0.62 cm and is also   hypoechoic. The fourth measured lesion is solid and isoechoic measuring     1.18 x 0.64 x 1.38 cm. This has a mixed echotexture overall is isoechoic.   The last lesion measured is in the upper pole laterally and has solid and  cystic components measuring 1.77 x 0.82 x 1.37 cm.    IMPRESSION:   1. Status post right thyroid lobectomy. It appears the  isthmus was   removed as well.  2. Multiple solid and cystic lesions and extensive heterogeneity in the   left thyroid lobe which is hypertrophied.    Dictation Site: 2        Verified By: Sundra Aland, M.D., MD    28-Apr-15 10:07, US Soft Tissue Head/Neck/Thyroid  US Soft Tissue Head/Neck/Thyroid   REASON FOR EXAM:    multi thyroid nodules lt lobe  COMMENTS:       PROCEDURE: [place]  - US SOFT TISSUE HEAD/NECK/THYROID  - Jan 24 2014 10:07AM     CLINICAL DATA:  Multiple thyroid nodules on the left. Status post  right thyroidectomy.    EXAM:  THYROID ULTRASOUND    TECHNIQUE:  Ultrasound examination of the thyroid gland and adjacent soft  tissues was performed.  COMPARISON:   07/05/2013    FINDINGS:  Right thyroid lobe    Surgically absent.  No residual right thyroid tissue is seen.    Left thyroid lobe    Measurements: 5.9 cm x 2.3 cm x 2.7 cm. Multiple left thyroid lobe  nodules. Left thyroid lobe is diffusely heterogeneous in  echogenicity.    Nodule 1: Lower pole hypoechoic nodule measures 17 mm x 8 mm x 14 mm  (previously 17 mm x 12 mm x 63m).  Nodule 2: Midpole nodule, mostly solid and isoechoic, measuring 13  mm x 6 mm x 12 mm, previously 12 mm x 6 mm x 14 mm.    Nodule 3: Upper pole nodule solid and mostly isoechoic measuring 16  mm x 8 mm x 14 mm, previously 18 mm x 8 mm x 14 mm.    Isthmus    No significant residual isthmus tissue    Lymphadenopathy    None visualized.     IMPRESSION:  1. Status post right thyroidectomy. No residual right thyroid tissue  is seen.  2. Mild enlargement of the left lobe with multiple nodules. Nodules  are stable when compared to the prior exam allowing for slight  differences in measurement technique. No new nodules.      Electronically Signed    By: DLajean ManesM.D.    On: 01/24/2014 10:24         Verified By: DLasandra Beech M.D.,  LGoodrich    03-Sep-14 14:42, [facility]     07-Oct-14 14:38, [place] Head[place]yroid  PACS Image     28-Apr-15 10:07, [place] Head/Neck/Thyroid  PACS Image    Assessment and Plan: Impression:   69year old female with metaplastic carcinoma of the right breast status post wide local excision with no sentinel biopsy ER/PR positive. Patient is status post adjuvant chemotherapy for whole breast radiation. Plan:   at this time [facility] would predict about 13% possibility of sentinel node would've been involved. I believe based on the small size of her breast lesion we can admit peripheral lymphatic radiation in her case since she has received adjuvant systemic chemotherapy.metaplastic breast cancer  is unusual although it may have a worse prognosis is treated as normal invasive K. carcinoma by Mohs clinicians. I will plan on delivering 5000 cGy to the right breastand a boosting her scar another 1600 cGy using electron beam treatment. Risks and benefits of treatment including skin reactions, fatigue, inclusion of some superficial lung, possible alteration of blood counts and possible thickening of the breast all were explained in detail to the patient. I have set her up for  CT simulation in about a week's time. We'll present her case our weekly tumor conference for any further suggestions and possible debate about peripheral lymphatic radiation.  I would like to take this opportunity for allowing me to participate in the care of your patient..  Fax to Physician:  Physicians To Recieve Fax: Birdie Sons, MD - [phone] Chauncey Cruel - [phone].  Electronic Signatures: Armstead Peaks (MD)  (Signed 26-Aug-15 13:52)  Authored: HPI, Diagnosis, Past Hx, PFSH, Allergies, Home Meds, ROS, Nursing Notes, Physical Exam, Other Results, Encounter Assessment and Plan, Fax to Physician   Last Updated: 26-Aug-15 13:52 by Armstead Peaks (MD)

## 2015-02-08 ENCOUNTER — Encounter: Payer: Self-pay | Admitting: Radiation Oncology

## 2015-02-08 ENCOUNTER — Encounter (INDEPENDENT_AMBULATORY_CARE_PROVIDER_SITE_OTHER): Payer: Self-pay

## 2015-02-08 ENCOUNTER — Ambulatory Visit
Admission: RE | Admit: 2015-02-08 | Discharge: 2015-02-08 | Disposition: A | Discharge status: Home / Self Care | Payer: Medicare Other | Source: Ambulatory Visit | Attending: Radiation Oncology | Admitting: Radiation Oncology

## 2015-02-08 VITALS — BP 136/78 | HR 79 | Temp 97.4°F | Resp 20 | Ht 66.0 in | Wt 193.3 lb

## 2015-02-08 DIAGNOSIS — C50911 Malignant neoplasm of unspecified site of right female breast: Secondary | ICD-10-CM

## 2015-02-08 NOTE — Progress Notes (Signed)
Last mammogram was in April 2015.  Next will be scheduled in a year.

## 2015-02-08 NOTE — Progress Notes (Signed)
Radiation Oncology Follow up Note  Name: Martha Evans   Date:   02/08/2015 MRN:  [id number] DOB: 26-Nov-1945    This 69 y.o. female presents to the clinic today for breast cancer follow-up.  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 She has a former history of breast recent reduction surgery bilaterally. She is seen today in routine follow-up and is doing well. She's been started on aromatase inhibitor therapy and tolerating that without side effect. She specifically denies breast tenderness cough or bone pain recently had a mammogram which was fine according to the patient..  COMPLICATIONS OF TREATMENT: none  FOLLOW UP COMPLIANCE: keeps appointments   PHYSICAL EXAM:  BP 136/78 mmHg  Pulse 79  Temp(Src) 97.4 F (36.3 C)  Resp 20  Ht 5' 6"  (1.676 m)  Wt 193 lb 5.5 oz (87.7 kg)  BMI 31.22 kg/m2 Well-developed female in NAD.Lungs are clear to A&P cardiac examination essentially unremarkable with regular rate and rhythm. No dominant mass or nodularity is noted in either breast in 2 positions examined. Incision is well-healed. No axillary or supraclavicular adenopathy is appreciated. Cosmetic result is excellent. Patient has obvious signs of prior breast reduction surgery. Well-developed well-nourished patient in NAD. HEENT reveals PERLA, EOMI, discs not visualized.  Oral cavity is clear. No oral mucosal lesions are identified. Neck is clear without evidence of cervical or supraclavicular adenopathy. Lungs are clear to A&P. Cardiac examination is essentially unremarkable with regular rate and rhythm without murmur rub or thrill. Abdomen is benign with no organomegaly or masses noted. Motor sensory and DTR levels are equal and symmetric in the upper and lower  extremities. Cranial nerves II through XII are grossly intact. Proprioception is intact. No peripheral adenopathy or edema is identified. No motor or sensory levels are noted. Crude visual fields are within normal range.   RADIOLOGY RESULTS: Mammogram dated 01/08/2015 reviewed showing no evidence of disease  PLAN: At the present time she continues to do well with no evidence of disease. Continues close follow-up care with medical oncology. I've asked to see her back in 1 year for follow-up. She continues on aromatase inhibitor without side effect. Patient is to call sooner with any concerns.  I would like to take this opportunity for allowing me to participate in the care of your patient.Armstead Peaks., MD

## 2015-02-15 ENCOUNTER — Telehealth: Payer: Self-pay | Admitting: *Deleted

## 2015-02-15 NOTE — Telephone Encounter (Signed)
VM message from patient requesting a CBC (last done 12/26/14) and to get her port flushed (last flushed 11/23/14)

## 2015-02-21 ENCOUNTER — Other Ambulatory Visit: Payer: Self-pay | Admitting: *Deleted

## 2015-02-21 ENCOUNTER — Telehealth: Payer: Self-pay | Admitting: Oncology

## 2015-02-21 NOTE — Telephone Encounter (Signed)
s.w. pt and advised on May appt....pt will get new sched tomorrow

## 2015-02-21 NOTE — Telephone Encounter (Signed)
POF sent to scheduling

## 2015-02-22 ENCOUNTER — Ambulatory Visit (HOSPITAL_BASED_OUTPATIENT_CLINIC_OR_DEPARTMENT_OTHER): Payer: Medicare Other

## 2015-02-22 ENCOUNTER — Other Ambulatory Visit (HOSPITAL_BASED_OUTPATIENT_CLINIC_OR_DEPARTMENT_OTHER): Payer: Medicare Other

## 2015-02-22 VITALS — BP 140/72 | HR 82 | Temp 97.7°F

## 2015-02-22 DIAGNOSIS — C50511 Malignant neoplasm of lower-outer quadrant of right female breast: Secondary | ICD-10-CM

## 2015-02-22 DIAGNOSIS — Z95828 Presence of other vascular implants and grafts: Secondary | ICD-10-CM

## 2015-02-22 LAB — COMPREHENSIVE METABOLIC PANEL (CC13)
ALT: 41 U/L (ref 0–55)
AST: 23 U/L (ref 5–34)
Albumin: 3.6 g/dL (ref 3.5–5.0)
Alkaline Phosphatase: 120 U/L (ref 40–150)
Anion Gap: 11 mEq/L (ref 3–11)
BILIRUBIN TOTAL: 0.44 mg/dL (ref 0.20–1.20)
BUN: 19.9 mg/dL (ref 7.0–26.0)
CHLORIDE: 107 meq/L (ref 98–109)
CO2: 23 meq/L (ref 22–29)
Calcium: 8.9 mg/dL (ref 8.4–10.4)
Creatinine: 0.9 mg/dL (ref 0.6–1.1)
EGFR: 69 mL/min/{1.73_m2} — ABNORMAL LOW (ref >90)
Glucose: 100 mg/dl (ref 70–140)
Potassium: 4 mEq/L (ref 3.5–5.1)
Sodium: 141 mEq/L (ref 136–145)
TOTAL PROTEIN: 6.6 g/dL (ref 6.4–8.3)

## 2015-02-22 LAB — CBC WITH DIFFERENTIAL/PLATELET
BASO%: 0.4 % (ref 0.0–2.0)
Basophils Absolute: 0 10*3/uL (ref 0.0–0.1)
EOS%: 3.9 % (ref 0.0–7.0)
Eosinophils Absolute: 0.2 10*3/uL (ref 0.0–0.5)
HEMATOCRIT: 34.6 % — AB (ref 34.8–46.6)
HGB: 11.4 g/dL — ABNORMAL LOW (ref 11.6–15.9)
LYMPH%: 22 % (ref 14.0–49.7)
MCH: 30.2 pg (ref 25.1–34.0)
MCHC: 32.9 g/dL (ref 31.5–36.0)
MCV: 91.8 fL (ref 79.5–101.0)
MONO#: 0.6 10*3/uL (ref 0.1–0.9)
MONO%: 11.4 % (ref 0.0–14.0)
NEUT#: 3.2 10*3/uL (ref 1.5–6.5)
NEUT%: 62.3 % (ref 38.4–76.8)
PLATELETS: 158 10*3/uL (ref 145–400)
RBC: 3.77 10*6/uL (ref 3.70–5.45)
RDW: 13.1 % (ref 11.2–14.5)
WBC: 5.1 10*3/uL (ref 3.9–10.3)
lymph#: 1.1 10*3/uL (ref 0.9–3.3)

## 2015-02-22 MED ORDER — HEPARIN SOD (PORK) LOCK FLUSH 100 UNIT/ML IV SOLN
500.0000 [IU] | Freq: Once | INTRAVENOUS | Status: AC
  Administered 2015-02-22: 500 [IU] via INTRAVENOUS
  Filled 2015-02-22: qty 5

## 2015-02-22 MED ORDER — SODIUM CHLORIDE 0.9 % IJ SOLN
10.0000 mL | INTRAMUSCULAR | Status: DC | PRN
  Administered 2015-02-22: 10 mL via INTRAVENOUS
  Filled 2015-02-22: qty 10

## 2015-02-22 NOTE — Patient Instructions (Signed)

## 2015-03-26 ENCOUNTER — Other Ambulatory Visit: Payer: Self-pay

## 2015-04-17 ENCOUNTER — Encounter (HOSPITAL_BASED_OUTPATIENT_CLINIC_OR_DEPARTMENT_OTHER): Payer: Self-pay | Admitting: *Deleted

## 2015-04-19 ENCOUNTER — Encounter (HOSPITAL_BASED_OUTPATIENT_CLINIC_OR_DEPARTMENT_OTHER): Payer: Self-pay | Admitting: Surgery

## 2015-04-19 NOTE — H&P (Signed)
[facility]  Carie Caddy. Lindblad DOB: 1946/07/03 Married / Language: English / Race: White Female  History of Present Illness  The patient is a 69 year old female who presents with diverticulitis. Her PCP is Dr. [name] [facility]).  She has breast cancer and is seeing Dr. Jana Hakim for med onc. She is seeing Dr. Ginger Carne for rad onc. Dr. Deanne Coffer did her breast surgery. She comes by herself.   I reversed her colostomy in Dec 2015. She has done well from this surgery. Her bowels are returning to normal function. We talked about follow up for breast cancer. She is to get a mammogram in Feb/March. She is going to see Dr. Jana Hakim in about a month. I told her I would usually follow my breast cancer patients every 6 months for at least 5 years. She wants to be followed by Dr. Harlow Asa.  History of diverticular disease: The patient had an exploratory laparotomy with removal of left colon and splenic flexure, Hartmann pouch, left transverse colon and colostomy by Dr. Alphonsa Overall 02/11/2014 for perf diverticulitis. Laparoscopic assisted colostomy reversal with resection of left colon and laparoscopic lysis of adhesions - 09/15/2014 - D. Lucia Gaskins  Past Medical History: 1. She has right breast cancer, T1, Nx. she had an invasive ductal carcinoma, 0.8 cm, grade 2-3, ER 0% PR 0%, Ki-67 - 84%, her 2 neu negative.  Right partical mastectomy - 12/13/2013 - T. Tennis Mckinnon Radiation tx with Dr. Donella Stade - finished Jul 23, 2014.  2. HTN  3. GERD  4. Left subclavian power port - April 2015 - Shaleka Brines  Social history: She is married. Her husband cannot look at her wounds.   Other Problems HISTORY OF DIVERTICULITIS OF COLON (V12.79  Z87.19) ASSESSMENT AND PLAN: 1. Exploratory laparotomy with removal of left colon and splenic flexure (mobilization of the splenic flexure), Hartmann pouch, and left transverse colon end colostomy - 02/11/2014 - D.  Newman For perforated diverticular disease. Hospitalized from 02/10/2014 to 02/24/2014 MALIGNANT NEOPLASM OF LOWER-OUTER QUADRANT OF RIGHT FEMALE BREAST (174.5  C50.511) Oncologist - [facility] She starts radiation therapy on 9/15 and plans to end 07/23/2014. Unspecified Diagnosis DIVERTICULITIS (562.11  K57.92) Breast Cancer Gastroesophageal Reflux Disease High blood pressure Thyroid Disease  Past Surgical History Breast Biopsy Right. Breast Mass; Local Excision Right. Colon Polyp Removal - Colonoscopy Colon Removal - Partial Hysterectomy (not due to cancer) - Complete Mammoplasty; Reduction Bilateral. Resection of Small Bowel Shoulder Surgery Right. Thyroid Surgery Tonsillectomy  Diagnostic Studies History Colonoscopy 5-10 years ago Mammogram within last year Pap Smear 1-5 years ago  Allergies Codeine Sulfate *ANALGESICS - OPIOID* TraMADol HCl *CHEMICALS* HYDROcodone Bitartrate *CHEMICALS* OxyCODONE HCl *ANALGESICS - OPIOID*  Medication History Neomycin Sulfate (500MG Tablet, 2 (two) Tablet Oral SEE NOTE, Taken starting 09/06/2014) Active. (TAKE TWO TABLETS AT 2 PM, 3 PM, AND 10 PM THE DAY PRIOR TO SURGERY) Flagyl (500MG Tablet, 2 (two) Tablet Oral SEE NOTE, Taken starting 09/06/2014) Active. (Take at 2pm, 3pm, and 10pm the day prior to your colon operation) B Complex Vitamins (Oral) Active. Tums 500 (1250MG Tablet Chewable, Oral) Active. Oscal 500/200 D-3 (500-200MG-UNIT Tablet, Oral) Active. Dexamethasone (4MG Tablet, Oral) Active. EMLA (2.5-2.5% Cream, External) Active. PriLOSEC (20MG Capsule DR, Oral) Active. Zofran (8MG Tablet, Oral) Active. MiraLax (Oral) Active. Senokot S (8.6-50MG Tablet, Oral) Active. Micardis (40MG Tablet, Oral) Active.  Social History Alcohol use Occasional alcohol use. Caffeine use Coffee, Tea. No drug use Tobacco use Never smoker.  Family History Diabetes Mellitus  Father. Kidney Disease Father. Malignant Neoplasm Of Pancreas Father. Migraine Headache Mother.  Pregnancy / Birth History Age at menarche 20 years. Age of menopause 54-55 Gravida 0 Para 0  Vitals  Weight: 183 lb Height: 66in Body Surface Area: 1.97 m Body Mass Index: 29.54 kg/m Temp.: 97.20F  Pulse: 78 (Regular)  BP: 120/80 (Sitting, Left Arm, Standard)  Physical Exam  General: WN WF alert and generally healthy appearing. HEENT: Normal. Pupils equal. Good dentition.  Lungs: Clear to auscultation and symmetric breath sounds. Heart: RRR. No murmur or rub. Breasts: I did not examine her breasts today.  Abdomen: Soft. No mass. No tenderness. No hernia. Normal bowel sounds. Her midline and LUQ ostomy incisions are well healed.Rectal: Not done.  Extremities: Good strength and ROM in upper and lower extremities.    Assessment & Plan HISTORY OF DIVERTICULITIS OF COLON (V12.79  Z87.19)  1. Exploratory laparotomy with removal of left colon and splenic flexure (mobilization of the splenic flexure), Hartmann pouch, and left transverse colon end colostomy - 02/11/2014 - D. Newman For perforated diverticular disease. Hospitalized from 02/10/2014 to 02/24/2014  2. Laparoscopic assisted colostomy reversal with resection of left colon and laparoscopic lysis of adhesions - 09/15/2014 - D. Newman  MALIGNANT NEOPLASM OF LOWER-OUTER QUADRANT OF RIGHT FEMALE BREAST (174.5  C50.511)  Plan removal of infusion port following completion of adjuvant chemotherapy.  The risks and benefits of the procedure have been discussed at length with the patient.  The patient understands the proposed procedure, potential alternative treatments, and the course of recovery to be expected.  All of the patient's questions have been answered at this time.  The patient wishes to proceed with surgery.  Earnstine Regal, MD, [facility] Office: [phone]

## 2015-04-20 ENCOUNTER — Ambulatory Visit (HOSPITAL_BASED_OUTPATIENT_CLINIC_OR_DEPARTMENT_OTHER): Payer: Medicare Other | Admitting: Anesthesiology

## 2015-04-20 ENCOUNTER — Encounter (HOSPITAL_BASED_OUTPATIENT_CLINIC_OR_DEPARTMENT_OTHER)
Admission: RE | Disposition: A | Discharge status: Home / Self Care | Payer: Self-pay | Source: Ambulatory Visit | Attending: Surgery

## 2015-04-20 ENCOUNTER — Ambulatory Visit (HOSPITAL_BASED_OUTPATIENT_CLINIC_OR_DEPARTMENT_OTHER)
Admission: RE | Admit: 2015-04-20 | Discharge: 2015-04-20 | Disposition: A | Discharge status: Home / Self Care | Payer: Medicare Other | Source: Ambulatory Visit | Attending: Surgery | Admitting: Surgery

## 2015-04-20 ENCOUNTER — Encounter (HOSPITAL_BASED_OUTPATIENT_CLINIC_OR_DEPARTMENT_OTHER): Payer: Self-pay | Admitting: *Deleted

## 2015-04-20 DIAGNOSIS — Z9221 Personal history of antineoplastic chemotherapy: Secondary | ICD-10-CM | POA: Diagnosis not present

## 2015-04-20 DIAGNOSIS — I10 Essential (primary) hypertension: Secondary | ICD-10-CM | POA: Insufficient documentation

## 2015-04-20 DIAGNOSIS — K219 Gastro-esophageal reflux disease without esophagitis: Secondary | ICD-10-CM | POA: Diagnosis not present

## 2015-04-20 DIAGNOSIS — Z6831 Body mass index (BMI) 31.0-31.9, adult: Secondary | ICD-10-CM | POA: Diagnosis not present

## 2015-04-20 DIAGNOSIS — Z7952 Long term (current) use of systemic steroids: Secondary | ICD-10-CM | POA: Insufficient documentation

## 2015-04-20 DIAGNOSIS — Z9049 Acquired absence of other specified parts of digestive tract: Secondary | ICD-10-CM | POA: Insufficient documentation

## 2015-04-20 DIAGNOSIS — Z933 Colostomy status: Secondary | ICD-10-CM | POA: Insufficient documentation

## 2015-04-20 DIAGNOSIS — Z170 Estrogen receptor positive status [ER+]: Secondary | ICD-10-CM

## 2015-04-20 DIAGNOSIS — Z853 Personal history of malignant neoplasm of breast: Secondary | ICD-10-CM | POA: Diagnosis not present

## 2015-04-20 DIAGNOSIS — Z9011 Acquired absence of right breast and nipple: Secondary | ICD-10-CM | POA: Diagnosis not present

## 2015-04-20 DIAGNOSIS — Z452 Encounter for adjustment and management of vascular access device: Secondary | ICD-10-CM | POA: Diagnosis present

## 2015-04-20 DIAGNOSIS — Z923 Personal history of irradiation: Secondary | ICD-10-CM | POA: Insufficient documentation

## 2015-04-20 DIAGNOSIS — Z79899 Other long term (current) drug therapy: Secondary | ICD-10-CM | POA: Diagnosis not present

## 2015-04-20 DIAGNOSIS — Z8719 Personal history of other diseases of the digestive system: Secondary | ICD-10-CM | POA: Diagnosis not present

## 2015-04-20 DIAGNOSIS — C50511 Malignant neoplasm of lower-outer quadrant of right female breast: Secondary | ICD-10-CM

## 2015-04-20 HISTORY — PX: PORT-A-CATH REMOVAL: SHX5289

## 2015-04-20 LAB — POCT HEMOGLOBIN-HEMACUE: Hemoglobin: 11.5 g/dL — ABNORMAL LOW (ref 12.0–15.0)

## 2015-04-20 SURGERY — REMOVAL PORT-A-CATH
Anesthesia: Monitor Anesthesia Care | Site: Chest | Laterality: Left

## 2015-04-20 MED ORDER — FENTANYL CITRATE (PF) 100 MCG/2ML IJ SOLN
INTRAMUSCULAR | Status: AC
  Filled 2015-04-20: qty 2

## 2015-04-20 MED ORDER — FENTANYL CITRATE (PF) 100 MCG/2ML IJ SOLN: 25.0000 ug | INTRAMUSCULAR | Status: DC | PRN

## 2015-04-20 MED ORDER — SCOPOLAMINE 1 MG/3DAYS TD PT72: 1.0000 | MEDICATED_PATCH | Freq: Once | TRANSDERMAL | Status: DC | PRN

## 2015-04-20 MED ORDER — LACTATED RINGERS IV SOLN
INTRAVENOUS | Status: DC
  Administered 2015-04-20: 07:00:00 via INTRAVENOUS

## 2015-04-20 MED ORDER — PROPOFOL 10 MG/ML IV BOLUS
INTRAVENOUS | Status: DC | PRN
  Administered 2015-04-20 (×3): 20 mg via INTRAVENOUS

## 2015-04-20 MED ORDER — PROMETHAZINE HCL 25 MG/ML IJ SOLN: 6.2500 mg | INTRAMUSCULAR | Status: DC | PRN

## 2015-04-20 MED ORDER — MEPERIDINE HCL 25 MG/ML IJ SOLN: 6.2500 mg | INTRAMUSCULAR | Status: DC | PRN

## 2015-04-20 MED ORDER — LIDOCAINE HCL (PF) 1 % IJ SOLN
INTRAMUSCULAR | Status: DC | PRN
  Administered 2015-04-20: 9 mL

## 2015-04-20 MED ORDER — ONDANSETRON HCL 4 MG/2ML IJ SOLN
INTRAMUSCULAR | Status: DC | PRN
  Administered 2015-04-20: 4 mg via INTRAVENOUS

## 2015-04-20 MED ORDER — LIDOCAINE-EPINEPHRINE (PF) 1 %-1:200000 IJ SOLN
INTRAMUSCULAR | Status: AC
  Filled 2015-04-20: qty 10

## 2015-04-20 MED ORDER — FENTANYL CITRATE (PF) 100 MCG/2ML IJ SOLN
50.0000 ug | INTRAMUSCULAR | Status: DC | PRN
  Administered 2015-04-20: 100 ug via INTRAVENOUS

## 2015-04-20 MED ORDER — GLYCOPYRROLATE 0.2 MG/ML IJ SOLN: 0.2000 mg | Freq: Once | INTRAMUSCULAR | Status: DC | PRN

## 2015-04-20 MED ORDER — MIDAZOLAM HCL 2 MG/2ML IJ SOLN
1.0000 mg | INTRAMUSCULAR | Status: DC | PRN
  Administered 2015-04-20: 2 mg via INTRAVENOUS

## 2015-04-20 MED ORDER — MIDAZOLAM HCL 2 MG/2ML IJ SOLN: 0.5000 mg | Freq: Once | INTRAMUSCULAR | Status: DC | PRN

## 2015-04-20 MED ORDER — MIDAZOLAM HCL 2 MG/2ML IJ SOLN
INTRAMUSCULAR | Status: AC
  Filled 2015-04-20: qty 2

## 2015-04-20 MED ORDER — HYDROCODONE-ACETAMINOPHEN 5-325 MG PO TABS: 1.0000 | ORAL_TABLET | ORAL | Status: DC | PRN

## 2015-04-20 SURGICAL SUPPLY — 33 items
APL SKNCLS STERI-STRIP NONHPOA (GAUZE/BANDAGES/DRESSINGS) ×1
BENZOIN TINCTURE PRP APPL 2/3 (GAUZE/BANDAGES/DRESSINGS) ×3 IMPLANT
BLADE SURG 15 STRL LF DISP TIS (BLADE) ×1 IMPLANT
BLADE SURG 15 STRL SS (BLADE) ×3
CHLORAPREP W/TINT 26ML (MISCELLANEOUS) ×3 IMPLANT
CLOSURE WOUND 1/2 X4 (GAUZE/BANDAGES/DRESSINGS) ×1
COVER BACK TABLE 60X90IN (DRAPES) ×3 IMPLANT
COVER MAYO STAND STRL (DRAPES) ×3 IMPLANT
DECANTER SPIKE VIAL GLASS SM (MISCELLANEOUS) IMPLANT
DRAPE LAPAROTOMY 100X72 PEDS (DRAPES) ×3 IMPLANT
DRAPE UTILITY XL STRL (DRAPES) ×3 IMPLANT
ELECT REM PT RETURN 9FT ADLT (ELECTROSURGICAL) ×3
ELECTRODE REM PT RTRN 9FT ADLT (ELECTROSURGICAL) ×1 IMPLANT
GLOVE BIO SURGEON STRL SZ7 (GLOVE) ×6 IMPLANT
GLOVE EXAM NITRILE EXT CUFF MD (GLOVE) ×3 IMPLANT
GLOVE SURG ORTHO 8.0 STRL STRW (GLOVE) ×3 IMPLANT
GOWN STRL REUS W/ TWL LRG LVL3 (GOWN DISPOSABLE) ×1 IMPLANT
GOWN STRL REUS W/ TWL XL LVL3 (GOWN DISPOSABLE) ×1 IMPLANT
GOWN STRL REUS W/TWL LRG LVL3 (GOWN DISPOSABLE) ×3
GOWN STRL REUS W/TWL XL LVL3 (GOWN DISPOSABLE) ×3
NDL HYPO 25X1 1.5 SAFETY (NEEDLE) IMPLANT
NEEDLE HYPO 25X1 1.5 SAFETY (NEEDLE) ×3 IMPLANT
PACK BASIN DAY SURGERY FS (CUSTOM PROCEDURE TRAY) ×3 IMPLANT
PENCIL BUTTON HOLSTER BLD 10FT (ELECTRODE) ×3 IMPLANT
SLEEVE SCD COMPRESS KNEE MED (MISCELLANEOUS) IMPLANT
SPONGE GAUZE 4X4 12PLY STER LF (GAUZE/BANDAGES/DRESSINGS) ×3 IMPLANT
STRIP CLOSURE SKIN 1/2X4 (GAUZE/BANDAGES/DRESSINGS) ×2 IMPLANT
SUT MNCRL AB 4-0 PS2 18 (SUTURE) ×3 IMPLANT
SUT VIC AB 3-0 SH 27 (SUTURE) ×3
SUT VIC AB 3-0 SH 27X BRD (SUTURE) ×1 IMPLANT
SYR CONTROL 10ML LL (SYRINGE) ×2 IMPLANT
TOWEL OR 17X24 6PK STRL BLUE (TOWEL DISPOSABLE) ×3 IMPLANT
TOWEL OR NON WOVEN STRL DISP B (DISPOSABLE) ×1 IMPLANT

## 2015-04-20 NOTE — Brief Op Note (Signed)
04/20/2015  7:59 AM  PATIENT:  Blair Dolphin  69 y.o. female  PRE-OPERATIVE DIAGNOSIS:  history breast cancer  POST-OPERATIVE DIAGNOSIS:  history breast cancer  PROCEDURE:  Procedure(s): REMOVAL PORT-A-CATH (Left)  SURGEON:  Surgeon(s) and Role:    * Armandina Gemma, MD - Primary  ANESTHESIA:   IV sedation  EBL:  Total I/O In: 600 [I.V.:600] Out: -   BLOOD ADMINISTERED:none  DRAINS: none   LOCAL MEDICATIONS USED:  MARCAINE     SPECIMEN:  No Specimen  DISPOSITION OF SPECIMEN:  N/A  COUNTS:  YES  TOURNIQUET:  * No tourniquets in log *  DICTATION: .Other Dictation: Dictation Number [id number]  PLAN OF CARE: Discharge to home after PACU  PATIENT DISPOSITION:  PACU - hemodynamically stable.   Delay start of Pharmacological VTE agent (>24hrs) due to surgical blood loss or risk of bleeding: yes  Earnstine Regal, MD, [facility] Office: [phone]

## 2015-04-20 NOTE — Transfer of Care (Signed)
Immediate Anesthesia Transfer of Care Note  Patient: Martha Evans  Procedure(s) Performed: Procedure(s): REMOVAL PORT-A-CATH (Left)  Patient Location: PACU  Anesthesia Type:MAC  Level of Consciousness: awake, alert  and oriented  Airway & Oxygen Therapy: Patient Spontanous Breathing  Post-op Assessment: Report given to RN and Post -op Vital signs reviewed and stable  Post vital signs: Reviewed and stable  Last Vitals:  Filed Vitals:   04/20/15 0647  BP: 146/77  Pulse: 64  Temp: 36.5 C  Resp: 16    Complications: No apparent anesthesia complications

## 2015-04-20 NOTE — Anesthesia Postprocedure Evaluation (Signed)
  Anesthesia Post-op Note  Patient: Martha Evans  Procedure(s) Performed: Procedure(s): REMOVAL PORT-A-CATH (Left)  Patient Location: PACU  Anesthesia Type:MAC  Level of Consciousness: awake, alert , oriented and patient cooperative  Airway and Oxygen Therapy: Patient Spontanous Breathing  Post-op Pain: none  Post-op Assessment: Post-op Vital signs reviewed, Patient's Cardiovascular Status Stable, Respiratory Function Stable, Patent Airway, No signs of Nausea or vomiting and Pain level controlled              Post-op Vital Signs: Reviewed and stable  Last Vitals:  Filed Vitals:   04/20/15 0830  BP: 127/60  Pulse: 48  Temp: 36.5 C  Resp: 16    Complications: No apparent anesthesia complications

## 2015-04-20 NOTE — Anesthesia Preprocedure Evaluation (Addendum)
Anesthesia Evaluation  Patient identified by MRN, date of birth, ID band Patient awake    Reviewed: Allergy & Precautions, NPO status , Patient's Chart, lab work & pertinent test results  History of Anesthesia Complications (+) DIFFICULT AIRWAY and history of anesthetic complications  Airway Mallampati: IV  TM Distance: >3 FB Neck ROM: Full    Dental  (+) Teeth Intact, Dental Advisory Given   Pulmonary neg pulmonary ROS,  breath sounds clear to auscultation        Cardiovascular hypertension, Pt. on medications Rhythm:Regular Rate:Normal  '15 ECHO: EF 65%, valves OK   Neuro/Psych negative neurological ROS     GI/Hepatic negative GI ROS, Neg liver ROS, GERD-  Medicated and Controlled,  Endo/Other  Morbid obesity  Renal/GU negative Renal ROS     Musculoskeletal   Abdominal (+) + obese,   Peds  Hematology negative hematology ROS (+)   Anesthesia Other Findings Breast cancer  Reproductive/Obstetrics                            Anesthesia Physical Anesthesia Plan  ASA: III  Anesthesia Plan: MAC   Post-op Pain Management:    Induction: Intravenous  Airway Management Planned: Natural Airway and Simple Face Mask  Additional Equipment:   Intra-op Plan:   Post-operative Plan:   Informed Consent: I have reviewed the patients History and Physical, chart, labs and discussed the procedure including the risks, benefits and alternatives for the proposed anesthesia with the patient or authorized representative who has indicated his/her understanding and acceptance.   Dental advisory given  Plan Discussed with: CRNA and Surgeon  Anesthesia Plan Comments: (Plan routine monitors, MAC)        Anesthesia Quick Evaluation

## 2015-04-20 NOTE — Interval H&P Note (Signed)
History and Physical Interval Note:  04/20/2015 7:13 AM  Martha Evans  has presented today for surgery, with the diagnosis of history breast cancer.  The various methods of treatment have been discussed with the patient and family. After consideration of risks, benefits and other options for treatment, the patient has consented to    Procedure(s): REMOVAL PORT-A-CATH (N/A) as a surgical intervention .    The patient's history has been reviewed, patient examined, no change in status, stable for surgery.  I have reviewed the patient's chart and labs.  Questions were answered to the patient's satisfaction.    Earnstine Regal, MD, [facility] Office: [place]

## 2015-04-20 NOTE — Discharge Instructions (Signed)

## 2015-04-20 NOTE — Anesthesia Procedure Notes (Signed)
Procedure Name: MAC Date/Time: 04/20/2015 7:32 AM Performed by: Melynda Ripple D Pre-anesthesia Checklist: Patient identified, Timeout performed, Emergency Drugs available, Suction available and Patient being monitored

## 2015-04-20 NOTE — Op Note (Signed)
[name], Martha Evans NO.:  [id number]  MEDICAL RECORD NO.:  [id number]  LOCATION:                               FACILITY:  [facility]  PHYSICIAN:  Earnstine Regal, MD      DATE OF BIRTH:  May 16, 1946  DATE OF PROCEDURE:  04/20/2015                              OPERATIVE REPORT   LOCATION:  [place].  PREOPERATIVE DIAGNOSIS:  History of breast cancer.  POSTOPERATIVE DIAGNOSIS:  History of breast cancer.  PROCEDURE:  Removal of left subclavian vein infusion port.  SURGEON:  Earnstine Regal, MD, FACS  ANESTHESIA:  Local with intravenous sedation.  PREPARATION:  ChloraPrep.  COMPLICATIONS:  None.  BLOOD LOSS:  Minimal.  INDICATIONS:  The patient is a 69 year old female who underwent adjuvant chemotherapy for management of breast carcinoma.  She has completed therapy.  She now comes to Surgery for removal of infusion port.  BODY OF REPORT:  Procedure was done in OR #8 at the [facility].  The patient was brought to the operating room, placed in a supine position on the operating room table.  Following administration of intravenous sedation, the patient was prepped and draped in the usual aseptic fashion.  After ascertaining that an adequate level of sedation had been achieved, the skin at the site of the Port-A-Cath in the upper left chest wall was anesthetized with local anesthetic.  Using a #15 blade, a 2 cm incision was made into the subcutaneous tissues, and hemostasis was achieved with electrocautery.  Fibrous sheath around the Port-A-Cath was opened.  Two Prolene sutures were extracted.  The entire Port-A-Cath was removed with the catheter and discarded.  The catheter tract was closed with a figure-of-eight 3-0 Vicryl suture.  Subcutaneous tissues were closed with interrupted 3-0 Vicryl sutures.  Skin was closed with a running 4-0 Monocryl subcuticular suture.  Wound was washed and dried, and benzoin and Steri-Strips  were applied.  Sterile dressings were applied.  The patient was awakened from anesthesia and brought to the recovery room.  The patient tolerated the procedure well.   Earnstine Regal, MD, [facility] Office: [phone]    TMG/MEDQ  D:  04/20/2015  T:  04/20/2015  Job:  [id number]

## 2015-04-23 ENCOUNTER — Encounter (HOSPITAL_BASED_OUTPATIENT_CLINIC_OR_DEPARTMENT_OTHER): Payer: Self-pay | Admitting: Surgery

## 2015-05-24 ENCOUNTER — Ambulatory Visit (INDEPENDENT_AMBULATORY_CARE_PROVIDER_SITE_OTHER): Payer: Medicare Other | Admitting: Family Medicine

## 2015-05-24 ENCOUNTER — Encounter: Payer: Self-pay | Admitting: Family Medicine

## 2015-05-24 VITALS — BP 122/68 | HR 78 | Temp 98.0°F | Resp 16 | Wt 194.0 lb

## 2015-05-24 DIAGNOSIS — E7800 Pure hypercholesterolemia, unspecified: Secondary | ICD-10-CM | POA: Insufficient documentation

## 2015-05-24 DIAGNOSIS — I517 Cardiomegaly: Secondary | ICD-10-CM | POA: Insufficient documentation

## 2015-05-24 DIAGNOSIS — N3090 Cystitis, unspecified without hematuria: Secondary | ICD-10-CM

## 2015-05-24 DIAGNOSIS — I10 Essential (primary) hypertension: Secondary | ICD-10-CM | POA: Insufficient documentation

## 2015-05-24 DIAGNOSIS — K219 Gastro-esophageal reflux disease without esophagitis: Secondary | ICD-10-CM | POA: Insufficient documentation

## 2015-05-24 LAB — POCT URINALYSIS DIPSTICK
Bilirubin, UA: NEGATIVE
Glucose, UA: NEGATIVE
KETONES UA: NEGATIVE
Nitrite, UA: NEGATIVE
Spec Grav, UA: 1.025
Urobilinogen, UA: 0.2
pH, UA: 6

## 2015-05-24 MED ORDER — NITROFURANTOIN MONOHYD MACRO 100 MG PO CAPS: 100.0000 mg | ORAL_CAPSULE | Freq: Two times a day (BID) | ORAL | Status: DC

## 2015-05-24 NOTE — Patient Instructions (Signed)
Increase fluids. We will call you the urine culture results.

## 2015-05-24 NOTE — Progress Notes (Signed)
Subjective:     Patient ID: Martha Evans, female   DOB: 1946-01-19, 69 y.o.   MRN: [id number]  HPI  Chief Complaint  Patient presents with  . Urinary Tract Infection    started about a week ago, pt has hx acute renal failure a year ago. when voiding feels like not emptying complete.  Last GFR in May > 60. Reports she has not had a UTI for years. Has completed chemotherapy for breast cancer but remains on tamoxifen.   Review of Systems  Constitutional: Negative for fever and chills.  Genitourinary: Positive for dysuria, urgency and frequency.       Objective:   Physical Exam  Constitutional: She appears well-developed and well-nourished. No distress.  Genitourinary:  No cva tenderness       Assessment:    1. Cystitis - POCT urinalysis dipstick - Urine culture - nitrofurantoin, macrocrystal-monohydrate, (MACROBID) 100 MG capsule; Take 1 capsule (100 mg total) by mouth 2 (two) times daily.  Dispense: 14 capsule; Refill: 0    Plan:    Continue increased fluid intake pending culture results.

## 2015-05-26 LAB — URINE CULTURE

## 2015-05-28 ENCOUNTER — Other Ambulatory Visit: Payer: Self-pay | Admitting: Family Medicine

## 2015-05-28 ENCOUNTER — Telehealth: Payer: Self-pay

## 2015-05-28 DIAGNOSIS — N3090 Cystitis, unspecified without hematuria: Secondary | ICD-10-CM

## 2015-05-28 MED ORDER — SULFAMETHOXAZOLE-TRIMETHOPRIM 800-160 MG PO TABS: 1.0000 | ORAL_TABLET | Freq: Two times a day (BID) | ORAL | Status: DC

## 2015-05-28 NOTE — Telephone Encounter (Signed)
Patient has been advised of culture. KW 

## 2015-05-28 NOTE — Telephone Encounter (Signed)
-----   Message from Carmon Ginsberg, [place] sent at 05/28/2015  7:56 AM EDT ----- Your infection is resistant to nitrofurantoin. I have switched you to Septra a sulfa antibiotic and sent it to your pharmacy.

## 2015-07-11 ENCOUNTER — Ambulatory Visit: Payer: Medicare Other | Admitting: Nurse Practitioner

## 2015-07-11 ENCOUNTER — Other Ambulatory Visit: Payer: Medicare Other

## 2015-07-19 ENCOUNTER — Other Ambulatory Visit: Payer: Self-pay

## 2015-07-19 DIAGNOSIS — C50511 Malignant neoplasm of lower-outer quadrant of right female breast: Secondary | ICD-10-CM

## 2015-07-20 ENCOUNTER — Ambulatory Visit (HOSPITAL_BASED_OUTPATIENT_CLINIC_OR_DEPARTMENT_OTHER): Payer: Medicare Other | Admitting: Nurse Practitioner

## 2015-07-20 ENCOUNTER — Other Ambulatory Visit (HOSPITAL_BASED_OUTPATIENT_CLINIC_OR_DEPARTMENT_OTHER): Payer: Medicare Other

## 2015-07-20 ENCOUNTER — Encounter: Payer: Self-pay | Admitting: Nurse Practitioner

## 2015-07-20 ENCOUNTER — Telehealth: Payer: Self-pay | Admitting: Oncology

## 2015-07-20 VITALS — BP 100/66 | HR 66 | Temp 98.1°F | Resp 19 | Ht 66.0 in | Wt 196.2 lb

## 2015-07-20 DIAGNOSIS — Z170 Estrogen receptor positive status [ER+]: Secondary | ICD-10-CM | POA: Diagnosis not present

## 2015-07-20 DIAGNOSIS — C50511 Malignant neoplasm of lower-outer quadrant of right female breast: Secondary | ICD-10-CM | POA: Diagnosis not present

## 2015-07-20 DIAGNOSIS — M25552 Pain in left hip: Secondary | ICD-10-CM

## 2015-07-20 LAB — CBC WITH DIFFERENTIAL/PLATELET
BASO%: 0.7 % (ref 0.0–2.0)
BASOS ABS: 0 10*3/uL (ref 0.0–0.1)
EOS%: 4.5 % (ref 0.0–7.0)
Eosinophils Absolute: 0.2 10*3/uL (ref 0.0–0.5)
HEMATOCRIT: 35.8 % (ref 34.8–46.6)
HGB: 11.8 g/dL (ref 11.6–15.9)
LYMPH%: 19.3 % (ref 14.0–49.7)
MCH: 30.9 pg (ref 25.1–34.0)
MCHC: 32.9 g/dL (ref 31.5–36.0)
MCV: 93.9 fL (ref 79.5–101.0)
MONO#: 0.5 10*3/uL (ref 0.1–0.9)
MONO%: 10 % (ref 0.0–14.0)
NEUT#: 3.3 10*3/uL (ref 1.5–6.5)
NEUT%: 65.5 % (ref 38.4–76.8)
Platelets: 170 10*3/uL (ref 145–400)
RBC: 3.82 10*6/uL (ref 3.70–5.45)
RDW: 13.1 % (ref 11.2–14.5)
WBC: 5.1 10*3/uL (ref 3.9–10.3)
lymph#: 1 10*3/uL (ref 0.9–3.3)

## 2015-07-20 LAB — COMPREHENSIVE METABOLIC PANEL (CC13)
ALBUMIN: 3.5 g/dL (ref 3.5–5.0)
ALT: 31 U/L (ref 0–55)
AST: 19 U/L (ref 5–34)
Alkaline Phosphatase: 101 U/L (ref 40–150)
Anion Gap: 6 mEq/L (ref 3–11)
BUN: 20.4 mg/dL (ref 7.0–26.0)
CALCIUM: 9.2 mg/dL (ref 8.4–10.4)
CO2: 26 mEq/L (ref 22–29)
Chloride: 111 mEq/L — ABNORMAL HIGH (ref 98–109)
Creatinine: 1 mg/dL (ref 0.6–1.1)
EGFR: 60 mL/min/{1.73_m2} — AB (ref >90)
Glucose: 104 mg/dl (ref 70–140)
POTASSIUM: 4.2 meq/L (ref 3.5–5.1)
Sodium: 142 mEq/L (ref 136–145)
TOTAL PROTEIN: 6.5 g/dL (ref 6.4–8.3)
Total Bilirubin: 0.37 mg/dL (ref 0.20–1.20)

## 2015-07-20 MED ORDER — TAMOXIFEN CITRATE 20 MG PO TABS: 20.0000 mg | ORAL_TABLET | Freq: Every day | ORAL | Status: DC

## 2015-07-20 NOTE — Telephone Encounter (Signed)
Appointments made and avs printed for patient.  Patient to see dr swinteck at [facility] [phone]  3:15

## 2015-07-20 NOTE — Progress Notes (Signed)
[place]  Telephone:([phone]) [phone] Fax:(336) [phone]     ID: ITZAMAR TRAYNOR DOB: 07-30-1946  MR#: [id number]  JIR#:[id number]  Patient Care Team: Birdie Sons, MD as PCP - General (Family Medicine) Teodoro Spray, MD as Consulting Physician (Cardiology) Armandina Gemma, MD as Consulting Physician (General Surgery) Thea Silversmith, MD as Consulting Physician (Radiation Oncology) Chauncey Cruel, MD as Consulting Physician (Oncology) Noreene Filbert, MD as Consulting Physician  CHIEF COMPLAINT: Metaplastic breast cancer  CURRENT TREATMENT:  Tamoxifen  BREAST CANCER HISTORY: From a Dr. Laurelyn Sickle initial intake note 12/21/2013:  "CHAVELA JUSTINIANO is a 69 y.o. female. Would medical history significant for hypertension gastroesophageal reflux disease. Patient underwent bilateral breast reductions in the 1980s. In February 2015 she had screening mammograms performed that showed a mass in the central right breast. She had ultrasound performed which revealed a 6 x 4 x 6 mm high-grade calcifications. Biopsy of these calcifications revealed invasive ductal carcinoma. On 12/12/2013 patient underwent a lumpectomy without sentinel lymph node biopsy. 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 Tumor was ER +2% PR +2% HER-2/neu negative with an elevated Ki-67 of 84%. Of note patient [could not] undergo a sentinel lymph node biopsy because of previous breast reduction."  The patient was started on adjuvant chemotherapy with cyclophosphamide and docetaxel 01/16/2014, but treatment was interrupted by a perforated diverticulum requiring partial colectomy 02/13/2014.   Her subsequent history is as detailed below  INTERVAL HISTORY: Titiana returns today for follow up of her breast cancer. She has been on tamoxifen since February of this year, and tolerates it reasonably well. Her hot flashes are still present, but she is inconsistent with her gabapentin use.  She has vaginal wetness and is using a panty liner daily. The interval history is remarkable for new left hip pain that runs down her leg. She assumed it was a sciatic pain and has been taking NSAIDs but it never got better. This has been going on for 1 month now.   REVIEW OF SYSTEMS: Tramaine denies fevers, chills, nausea, or vomiting. She is staying well hydrated and moves her bowels well since having her colostomy removed late last year. Her appetite is good. She is short of breath with exertion, but denies chest pain, cough, or palpitations. She has occasional headaches and dizziness, but they are no worse than usual. Her energy level and mood are goo., a detailed review of systems is otherwise stable.  PAST MEDICAL HISTORY: Past Medical History  Diagnosis Date  . Hypertension   . Allergy   . Wears hearing aid     right  . GERD (gastroesophageal reflux disease)   . Complication of anesthesia     was hard to intubate with thyroid surgery-99  . Difficult intubation 1999    when had thyroid lobectomy  . Dysrhythmia     irregular reuglar heart beat   . Cancer     breast cancer right breast    PAST SURGICAL HISTORY: Past Surgical History  Procedure Laterality Date  . Thyroid lobectomy  1999    right  . Rotator cuff repair  2006    right shoulder  . Breast reduction surgery Bilateral     1980's  . Abdominal hysterectomy  1999  . Tonsillectomy      as child  . Appendectomy      as child  . Colonoscopy    . Partial mastectomy with needle localization and axillary sentinel lymph node bx Right  12/12/2013    Procedure: PARTIAL MASTECTOMY WITH NEEDLE LOCALIZATION AND AXILLARY SENTINEL LYMPH NODE BX;  Surgeon: Earnstine Regal, MD;  Location: [facility];  Service: General;  Laterality: Right;  . Portacath placement Left 01/02/2014    Procedure: INSERTION PORT-A-CATH;  Surgeon: Earnstine Regal, MD;  Location: [facility];  Service: General;  Laterality: Left;  . Laparotomy N/A  02/11/2014    Procedure: EXPLORATORY LAPAROTOMY, COLON RESECTION, COLOSTOMY;  Surgeon: Shann Medal, MD;  Location: [facility];  Service: General;  Laterality: N/A;  . Colonoscopy N/A 08/22/2014    Procedure: COLONOSCOPY;  Surgeon: Alphonsa Overall, MD;  Location: [facility];  Service: General;  Laterality: N/A;  . Colon resection N/A 09/15/2014    Procedure: Laparoscopic hand assisted colostomy reversal with resection of left colon and laparoscopic lysis of adhesions ;  Surgeon: Alphonsa Overall, MD;  Location: [facility];  Service: General;  Laterality: N/A;  . Port-a-cath removal Left 04/20/2015    Procedure: REMOVAL PORT-A-CATH;  Surgeon: Armandina Gemma, MD;  Location: [facility];  Service: General;  Laterality: Left;    FAMILY HISTORY Family History  Problem Relation Age of Onset  . Cancer Father     bladder & pancreatic  The patient's father died from pancreatic cancer the age of 10. The patient's mother is alive, age 47, with significant Alzheimer's disease. The patient had one brother, no sisters. There are no first-degree relatives with breast or ovarian cancer.  GYNECOLOGIC HISTORY:  No LMP recorded. Patient has had a hysterectomy. Menarche age 78. The patient is GX P0. She underwent hysterectomy with bilateral salpingo-oophorectomy in 1999. She used an estrogen patch for several years after that.   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 Her husband, Lendon Collar, works as a Armed forces logistics/support/administrative officer. He has 3 children from a prior marriage, living in [place], [place], and both work. The patient attends a local [employer]    ADVANCED DIRECTIVES: Not in place   HEALTH MAINTENANCE: Social History  Substance Use Topics  . Smoking status: Never Smoker   . Smokeless tobacco: Never Used  . Alcohol Use: Yes     Comment: seldom     Colonoscopy:  PAP: Status post hysterectomy  Bone density:  Lipid panel:  Allergies  Allergen  Reactions  . Codeine Sulfate Nausea And Vomiting  . Tramadol Nausea Only    Nausea     Current Outpatient Prescriptions  Medication Sig Dispense Refill  . gabapentin (NEURONTIN) 300 MG capsule Take 1 capsule (300 mg total) by mouth at bedtime. 90 capsule 3  . omeprazole (PRILOSEC) 20 MG capsule Take 20 mg by mouth 2 (two) times daily before a meal.     . tamoxifen (NOLVADEX) 20 MG tablet Take 1 tablet (20 mg total) by mouth daily. 90 tablet 3  . telmisartan (MICARDIS) 40 MG tablet Take 40 mg by mouth at bedtime.      No current facility-administered medications for this visit.    OBJECTIVE: Middle-aged white woman in no acute distress  Filed Vitals:   07/20/15 1117  BP: 100/66  Pulse: 66  Temp: 98.1 F (36.7 C)  Resp: 19     Body mass index is 31.68 kg/(m^2).    ECOG FS:0 - Asymptomatic  Skin: warm, dry  HEENT: sclerae anicteric, conjunctivae pink, oropharynx clear. No thrush or mucositis.  Lymph Nodes: No cervical or supraclavicular lymphadenopathy  Lungs: clear to auscultation bilaterally, no  rales, wheezes, or rhonci  Heart: regular rate and rhythm  Abdomen: round, soft, non tender, positive bowel sounds  Musculoskeletal: No focal spinal tenderness, no peripheral edema  Neuro: non focal, well oriented, positive affect  Breasts: right breast status post lumpectomy and radiation. Palpable scar tissue along lower outer breast. No evidence of recurrent disease. Right axilla benign. Left breast status post reduction. Otherwise unremarkable.   LAB RESULTS:  CMP     Component Value Date/Time   NA 141 02/22/2015 1103   NA 141 09/18/2014 0440   K 4.0 02/22/2015 1103   K 3.7 09/18/2014 0440   CL 102 09/18/2014 0440   CO2 23 02/22/2015 1103   CO2 28 09/18/2014 0440   GLUCOSE 100 02/22/2015 1103   GLUCOSE 107* 09/18/2014 0440   BUN 19.9 02/22/2015 1103   BUN 9 09/18/2014 0440   CREATININE 0.9 02/22/2015 1103   CREATININE 0.87 09/18/2014 0440   CALCIUM 8.9 02/22/2015 1103     CALCIUM 9.4 09/18/2014 0440   PROT 6.6 02/22/2015 1103   PROT 5.5* 02/15/2014 0329   ALBUMIN 3.6 02/22/2015 1103   ALBUMIN 2.2* 02/15/2014 0329   AST 23 02/22/2015 1103   AST 19 02/15/2014 0329   ALT 41 02/22/2015 1103   ALT 21 02/15/2014 0329   ALKPHOS 120 02/22/2015 1103   ALKPHOS 107 02/15/2014 0329   BILITOT 0.44 02/22/2015 1103   BILITOT 0.6 02/15/2014 0329   GFRNONAA 67* 09/18/2014 0440   GFRAA 78* 09/18/2014 0440    I No results found for: SPEP  Lab Results  Component Value Date   WBC 5.1 07/20/2015   NEUTROABS 3.3 07/20/2015   HGB 11.8 07/20/2015   HCT 35.8 07/20/2015   MCV 93.9 07/20/2015   PLT 170 07/20/2015      Chemistry      Component Value Date/Time   NA 141 02/22/2015 1103   NA 141 09/18/2014 0440   K 4.0 02/22/2015 1103   K 3.7 09/18/2014 0440   CL 102 09/18/2014 0440   CO2 23 02/22/2015 1103   CO2 28 09/18/2014 0440   BUN 19.9 02/22/2015 1103   BUN 9 09/18/2014 0440   CREATININE 0.9 02/22/2015 1103   CREATININE 0.87 09/18/2014 0440      Component Value Date/Time   CALCIUM 8.9 02/22/2015 1103   CALCIUM 9.4 09/18/2014 0440   ALKPHOS 120 02/22/2015 1103   ALKPHOS 107 02/15/2014 0329   AST 23 02/22/2015 1103   AST 19 02/15/2014 0329   ALT 41 02/22/2015 1103   ALT 21 02/15/2014 0329   BILITOT 0.44 02/22/2015 1103   BILITOT 0.6 02/15/2014 0329       No results found for: LABCA2  No components found for: LABCA125  No results for input(s): INR in the last 168 hours.  Urinalysis    Component Value Date/Time   COLORURINE YELLOW 02/17/2014 1639   APPEARANCEUR CLOUDY* 02/17/2014 1639   LABSPEC 1.016 02/17/2014 1639   PHURINE 5.0 02/17/2014 1639   GLUCOSEU NEGATIVE 02/17/2014 1639   HGBUR NEGATIVE 02/17/2014 1639   BILIRUBINUR negative 05/24/2015 [place] 02/17/2014 1639   KETONESUR NEGATIVE 02/17/2014 1639   PROTEINUR + 30 05/24/2015 1157   PROTEINUR 30* 02/17/2014 1639   UROBILINOGEN 0.2 05/24/2015 1157    UROBILINOGEN 1.0 02/17/2014 1639   NITRITE negative 05/24/2015 1157   NITRITE NEGATIVE 02/17/2014 1639   LEUKOCYTESUR moderate (2+)* 05/24/2015 1157    STUDIES: No results found.  ASSESSMENT: 69 y.o. [place], [place] woman  (  1) status post right breast biopsy 12/01/2013 for an invasive ductal carcinoma, grade 2 or 3, estrogen receptor 2% "positive", progesterone receptor 2% "positive", both with moderate staining intensity, with an MIB-1 of 84%, and no HER-2 amplification  (2) status post right lumpectomy 12/12/2013 ([id number]) for a pT1b pNX invasive ductal carcinoma, metaplastic, high-grade, estrogen and progesterone receptor negative, repeat HER-2 again negative. Because of prior reduction mammoplasty sentinel lymph node sampling was not performed  (3) adjuvant chemotherapy with cyclophosphamide and docetaxel started 28/90/2284, complicated by peripheral neuropathy, and interrupted after 2 cycles because of diverticular perforation requiring partial colectomy (02/13/2014)  (4) completed 2 additional cycles of chemotherapy 05/22/2014, consisting of carboplatin given day 1 and gemcitabine days 1 and 8 of each 21 day cycle, with Neulasta on day 9.  (5) adjuvant radiation under Dr. Berton [place] followed chemotherapy  (6) tamoxifen started January 2016 stopped 2 weeks into drug. To restart in February 2016  (7) diverticulitis, status post partial colectomy with colostomy placement may 05/18/2014, status post colostomy takedown 09/15/2014  PLAN: Lianna looks and feels well today. The labs were reviewed in detail and were stable. She is tolerating the tamoxifen well and will continue this drug for 5-10 years of antiestrogen therapy.   As far as her hip pain is concerned, I believe an MRI of the hip is indicated. I have ordered on to be performed this month, and I have sent a referral to [facility] for further evaluation.   Kyannah will be due for a repeat mammogram in early April.  She will return to see [place] shortly after that visit. She understands and agrees with this plan. She knows the goal of treatment in her case is cure. She has been encouraged to call with any issues that might arise before her next visit here.    Laurie Panda, NP  07/20/2015 11:54 AM

## 2015-07-25 ENCOUNTER — Telehealth: Payer: Self-pay | Admitting: *Deleted

## 2015-07-25 NOTE — Telephone Encounter (Signed)
Called asking to "speak with Nira Conn about MRI.  Appointment with orthopedic is 07-31-2015."  Central Scheduling number given to patient to call to schedule [phone]).

## 2015-07-31 ENCOUNTER — Ambulatory Visit (HOSPITAL_COMMUNITY): Payer: Medicare Other

## 2015-08-03 ENCOUNTER — Telehealth: Payer: Self-pay | Admitting: *Deleted

## 2015-08-03 ENCOUNTER — Other Ambulatory Visit: Payer: Self-pay | Admitting: Oncology

## 2015-08-03 NOTE — Telephone Encounter (Signed)
Received call from pt concerning left hip pain. Pt has been taking her husband's Ibuprofen 800 mg tab twice daily. She said it has helped and wanted to know if we could Rx her Ibuprofen 800 mg. I told pt to contact her PCP or the Orthopedic doctor she visited recently to see if they will prescribe. Pt is to get MRI of the hip on 11/7 to find out the source of the reason why she is having the pain.

## 2015-08-06 ENCOUNTER — Ambulatory Visit (HOSPITAL_COMMUNITY)
Admission: RE | Admit: 2015-08-06 | Discharge: 2015-08-06 | Disposition: A | Discharge status: Home / Self Care | Payer: Medicare Other | Source: Ambulatory Visit | Attending: Nurse Practitioner | Admitting: Nurse Practitioner

## 2015-08-06 DIAGNOSIS — Z853 Personal history of malignant neoplasm of breast: Secondary | ICD-10-CM | POA: Insufficient documentation

## 2015-08-06 DIAGNOSIS — M7071 Other bursitis of hip, right hip: Secondary | ICD-10-CM | POA: Insufficient documentation

## 2015-08-06 DIAGNOSIS — M25552 Pain in left hip: Secondary | ICD-10-CM

## 2015-08-06 DIAGNOSIS — M67952 Unspecified disorder of synovium and tendon, left thigh: Secondary | ICD-10-CM | POA: Diagnosis not present

## 2015-08-06 DIAGNOSIS — M67951 Unspecified disorder of synovium and tendon, right thigh: Secondary | ICD-10-CM | POA: Diagnosis not present

## 2015-08-06 DIAGNOSIS — M5136 Other intervertebral disc degeneration, lumbar region: Secondary | ICD-10-CM | POA: Insufficient documentation

## 2015-08-06 MED ORDER — GADOBENATE DIMEGLUMINE 529 MG/ML IV SOLN
20.0000 mL | Freq: Once | INTRAVENOUS | Status: AC | PRN
  Administered 2015-08-06: 18 mL via INTRAVENOUS

## 2015-08-10 ENCOUNTER — Encounter: Payer: Self-pay | Admitting: Nurse Practitioner

## 2015-08-13 ENCOUNTER — Other Ambulatory Visit: Payer: Self-pay | Admitting: Oncology

## 2015-08-13 NOTE — Telephone Encounter (Signed)
Call received in [place] from pt inquiring about MRI results which she was able to view per my chart- "what is the follow up "  Result reviewed with pt - Martha Evans states she is scheduled to see an orthopedist this Wednesday at [facility].  This RN informed pt above is appropriate follow up per results-   Results will be faxed to Dr Lyla Glassing per above appointment.  No other needs at this time.

## 2015-10-11 DIAGNOSIS — E034 Atrophy of thyroid (acquired): Secondary | ICD-10-CM | POA: Insufficient documentation

## 2015-10-11 DIAGNOSIS — K635 Polyp of colon: Secondary | ICD-10-CM | POA: Insufficient documentation

## 2015-10-11 DIAGNOSIS — J309 Allergic rhinitis, unspecified: Secondary | ICD-10-CM | POA: Insufficient documentation

## 2015-10-11 DIAGNOSIS — E559 Vitamin D deficiency, unspecified: Secondary | ICD-10-CM | POA: Insufficient documentation

## 2015-10-12 ENCOUNTER — Ambulatory Visit (INDEPENDENT_AMBULATORY_CARE_PROVIDER_SITE_OTHER): Payer: PPO | Admitting: Family Medicine

## 2015-10-12 ENCOUNTER — Encounter: Payer: Self-pay | Admitting: Family Medicine

## 2015-10-12 VITALS — BP 110/64 | HR 94 | Temp 98.5°F | Resp 16 | Wt 202.0 lb

## 2015-10-12 DIAGNOSIS — J0100 Acute maxillary sinusitis, unspecified: Secondary | ICD-10-CM | POA: Diagnosis not present

## 2015-10-12 MED ORDER — FLUTICASONE PROPIONATE 50 MCG/ACT NA SUSP: 2.0000 | Freq: Every day | NASAL | Status: DC

## 2015-10-12 MED ORDER — AMOXICILLIN 500 MG PO CAPS: 500.0000 mg | ORAL_CAPSULE | Freq: Three times a day (TID) | ORAL | Status: DC

## 2015-10-12 NOTE — Patient Instructions (Signed)
Start taking amoxicillin of you develop fever over 101, worsening pain in you sinuses, or if not feeling better by next week.

## 2015-10-12 NOTE — Progress Notes (Signed)
Patient: Martha Evans Female    DOB: 1946-08-25   70 y.o.   MRN: [id number] Visit Date: 10/12/2015  Today's Provider: [name], MD   Chief Complaint  Patient presents with  . Sinusitis   Subjective:    Sinusitis This is a new problem. The current episode started in the past 7 days (x2 days). Associated symptoms include chills, congestion, coughing, diaphoresis, headaches, sinus pressure and sneezing. Pertinent negatives include no ear pain, hoarse voice, neck pain, shortness of breath, sore throat or swollen glands. Past treatments include sitting up ([place] and Nyquil). The treatment provided mild relief.   Nasal congestion started 2 days ago with sneezing. Nasal drainage and congestion. Runny nose, sinus pressure and headache. Cough from nasal drainage. No fever.   Allergies  Allergen Reactions  . Codeine Sulfate Nausea And Vomiting  . Tramadol Nausea Only    Nausea    Previous Medications   CALCIUM-MAGNESIUM-VITAMIN D (CALCIUM 500 PO)    Take 1 tablet by mouth daily. Reported on 10/12/2015   CHOLECALCIFEROL (VITAMIN D) 2000 UNITS CAPS    Take 1 tablet by mouth daily. Reported on 10/12/2015   COENZYME Q10 (COQ10 PO)    Take 1 tablet by mouth daily. Reported on 10/12/2015   GABAPENTIN (NEURONTIN) 300 MG CAPSULE    Take 1 capsule (300 mg total) by mouth at bedtime.   LORATADINE (CLARITIN) 10 MG TABLET    Take 1 tablet by mouth daily.   MILK THISTLE 175 MG CAPS    Take 1 tablet by mouth daily.   OMEPRAZOLE (PRILOSEC) 20 MG CAPSULE    Take 20 mg by mouth 2 (two) times daily before a meal.    TAMOXIFEN (NOLVADEX) 20 MG TABLET    Take 1 tablet (20 mg total) by mouth daily.   TELMISARTAN (MICARDIS) 40 MG TABLET    Take 40 mg by mouth at bedtime.     Review of Systems  Constitutional: Positive for chills and diaphoresis. Negative for fever, appetite change and fatigue.  HENT: Positive for congestion, postnasal drip, rhinorrhea, sinus pressure and sneezing. Negative for ear  pain, hoarse voice and sore throat.   Respiratory: Positive for cough. Negative for chest tightness, shortness of breath and wheezing.   Cardiovascular: Negative for chest pain and palpitations.  Gastrointestinal: Negative for nausea, vomiting and abdominal pain.  Musculoskeletal: Negative for neck pain.  Neurological: Positive for headaches. Negative for dizziness and weakness.    Social History  Substance Use Topics  . Smoking status: Never Smoker   . Smokeless tobacco: Never Used  . Alcohol Use: Yes     Comment: seldom   Objective:   BP 110/64 mmHg  Pulse 94  Temp(Src) 98.5 F (36.9 C) (Oral)  Resp 16  Wt 202 lb (91.627 kg)  SpO2 98%  Physical Exam  General Appearance:    Alert, cooperative, no distress  HENT:   bilateral TM normal without fluid or infection, neck without nodes, pharynx erythematous without exudate, frontal sinuses slightly tender and nasal mucosa pale and congested  Eyes:    PERRL, conjunctiva/corneas clear, EOM's intact       Lungs:     Clear to auscultation bilaterally, respirations unlabored  Heart:    Regular rate and rhythm  Neurologic:   Awake, alert, oriented x 3. No apparent focal neurological           defect.           Assessment & Plan:  1. Acute maxillary sinusitis, recurrence not specified Start nasal steroid. Counseled regarding signs and symptoms of viral and bacterial respiratory infections. Advised to start antibiotic if she develops any sign of bacterial infection, or if current symptoms last longer than 7 days.    - fluticasone (FLONASE) 50 MCG/ACT nasal spray; Place 2 sprays into both nostrils daily.  Dispense: 16 g; Refill: 6 - amoxicillin (AMOXIL) 500 MG capsule; Take 1 capsule (500 mg total) by mouth 3 (three) times daily.  Dispense: 30 capsule; Refill: 0       [name], MD  [facility]

## 2015-12-04 ENCOUNTER — Other Ambulatory Visit: Payer: Self-pay | Admitting: Oncology

## 2015-12-04 DIAGNOSIS — Z853 Personal history of malignant neoplasm of breast: Secondary | ICD-10-CM

## 2015-12-11 ENCOUNTER — Encounter: Payer: Self-pay | Admitting: Family Medicine

## 2015-12-11 ENCOUNTER — Ambulatory Visit (INDEPENDENT_AMBULATORY_CARE_PROVIDER_SITE_OTHER): Payer: PPO | Admitting: Family Medicine

## 2015-12-11 ENCOUNTER — Other Ambulatory Visit: Payer: Self-pay | Admitting: Family Medicine

## 2015-12-11 VITALS — BP 118/70 | HR 84 | Temp 98.4°F | Resp 16 | Wt 200.6 lb

## 2015-12-11 DIAGNOSIS — N3091 Cystitis, unspecified with hematuria: Secondary | ICD-10-CM

## 2015-12-11 LAB — POCT URINALYSIS DIPSTICK
Bilirubin, UA: NEGATIVE
Glucose, UA: NEGATIVE
KETONES UA: NEGATIVE
Nitrite, UA: NEGATIVE
PH UA: 5
PROTEIN UA: 30
SPEC GRAV UA: 1.02
Urobilinogen, UA: 0.2

## 2015-12-11 MED ORDER — SULFAMETHOXAZOLE-TRIMETHOPRIM 800-160 MG PO TABS: 1.0000 | ORAL_TABLET | Freq: Two times a day (BID) | ORAL | Status: DC

## 2015-12-11 NOTE — Patient Instructions (Signed)
Increase fluid intake.

## 2015-12-11 NOTE — Progress Notes (Signed)
Subjective:     Patient ID: Martha Evans, female   DOB: 29-Sep-1946, 70 y.o.   MRN: [id number]  HPI  Chief Complaint  Patient presents with  . Dysuria    Patient comes into office today with complaints of urinary frequency, urgency and lower back pain for the past week.   Last treated for a Proteus infection in August of 2016 with TMP-SMZ.   Review of Systems  Constitutional: Positive for chills. Negative for fever.       Objective:   Physical Exam  Constitutional: She appears well-developed and well-nourished. No distress.  Genitourinary:  No c.v.a. tenderness       Assessment:    1. Cystitis with hematuria - POCT urinalysis dipstick - Urine culture - sulfamethoxazole-trimethoprim (BACTRIM DS,SEPTRA DS) 800-160 MG tablet; Take 1 tablet by mouth 2 (two) times daily.  Dispense: 14 tablet; Refill: 0    Plan:    Further f/u pending culture results.

## 2015-12-14 LAB — URINE CULTURE

## 2015-12-17 ENCOUNTER — Telehealth: Payer: Self-pay

## 2015-12-17 NOTE — Telephone Encounter (Signed)
Patient advised as directed below. Patient verbalized understanding.  

## 2015-12-17 NOTE — Telephone Encounter (Signed)
[id number] 

## 2015-12-17 NOTE — Telephone Encounter (Signed)
-----   Message from Carmon Ginsberg, [place] sent at 12/14/2015  5:25 PM EDT ----- No growth on urine culture, may stop antibiotic.

## 2016-01-04 ENCOUNTER — Other Ambulatory Visit: Payer: Self-pay | Admitting: *Deleted

## 2016-01-04 MED ORDER — TAMOXIFEN CITRATE 20 MG PO TABS: 20.0000 mg | ORAL_TABLET | Freq: Every day | ORAL | Status: DC

## 2016-01-09 ENCOUNTER — Ambulatory Visit
Admission: RE | Admit: 2016-01-09 | Discharge: 2016-01-09 | Disposition: A | Discharge status: Home / Self Care | Payer: PPO | Source: Ambulatory Visit | Attending: Oncology | Admitting: Oncology

## 2016-01-09 DIAGNOSIS — Z853 Personal history of malignant neoplasm of breast: Secondary | ICD-10-CM

## 2016-01-09 DIAGNOSIS — R928 Other abnormal and inconclusive findings on diagnostic imaging of breast: Secondary | ICD-10-CM | POA: Diagnosis not present

## 2016-01-27 NOTE — Progress Notes (Signed)
Martha Evans  [phone] [phone]     ID: CAROLL WEINHEIMER DOB: 1946-09-02  MR#: [id number]  CLE#:[id number]  Patient Care Team: Birdie Sons, MD as PCP - General (Family Medicine) Teodoro Spray, MD as Consulting Physician (Cardiology) Armandina Gemma, MD as Consulting Physician (General Surgery) Thea Silversmith, MD as Consulting Physician (Radiation Oncology) Chauncey Cruel, MD as Consulting Physician (Oncology) Noreene Filbert, MD as Consulting Physician Rod Can, MD as Consulting Physician (Orthopedic Surgery)  CHIEF COMPLAINT: Metaplastic breast cancer  CURRENT TREATMENT:  Tamoxifen  BREAST CANCER HISTORY: From a Dr. Laurelyn Sickle initial intake note 12/21/2013:  "Martha Evans is a 70 y.o. female. Would medical history significant for hypertension gastroesophageal reflux disease. Patient underwent bilateral breast reductions in the 1980s. In 2013/12/02 she had screening mammograms performed that showed a mass in the central right breast. She had ultrasound performed which revealed a 6 x 4 x 6 mm high-grade calcifications. Biopsy of these calcifications revealed invasive ductal carcinoma. On 12/12/2013 patient underwent a lumpectomy without sentinel lymph node biopsy. 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 Tumor was ER +2% PR +2% HER-2/neu negative with an elevated Ki-67 of 84%. Of note patient [could not] undergo a sentinel lymph node biopsy because of previous breast reduction."  The patient was started on adjuvant chemotherapy with cyclophosphamide and docetaxel 01/16/2014, but treatment was interrupted by a perforated diverticulum requiring partial colectomy 02/13/2014.   Her subsequent history is as detailed below  INTERVAL HISTORY: Martha Evans returns today for follow-up of her estrogen receptor positive breast cancer. The interval history is generally unremarkable. She is generally feeling "much  better". She attributes this to a better diet and more exercise. She is tolerating tamoxifen somewhat better, but still is having hot flashes. She's having vaginal dryness problems. She does obtain the drug at a good price.  REVIEW OF SYSTEMS: Rakeisha's mother died 12-03-2015, with Alzheimer's disease. She was 88. She has significant Parkinson's disease. Odesser wonders if she has a tremor that might indicate early Parkinson's or if there is any tests that would tell her whether she has or not. She feels mildly fatigued. She keeps a dry cough. Otherwise aside from urinary tract infections, a detailed review of systems today was noncontributory  PAST MEDICAL HISTORY: Past Medical History  Diagnosis Date  . Hypertension   . Allergy   . Wears hearing aid     right  . GERD (gastroesophageal reflux disease)   . Complication of anesthesia     was hard to intubate with thyroid surgery-99  . Difficult intubation 1999    when had thyroid lobectomy  . Dysrhythmia     irregular reuglar heart beat   . Cancer [facility])     breast cancer right breast  . History of chicken pox   . History of measles     PAST SURGICAL HISTORY: Past Surgical History  Procedure Laterality Date  . Thyroid lobectomy  1999    right  . Rotator cuff repair  02-Dec-2004    right shoulder  . Breast reduction surgery Bilateral     1980's  . Abdominal hysterectomy  1999  . Tonsillectomy      as child  . Appendectomy      as child  . Colonoscopy    . Partial mastectomy with needle localization and axillary sentinel lymph node bx Right 12/12/2013    Procedure: PARTIAL MASTECTOMY WITH NEEDLE LOCALIZATION AND AXILLARY SENTINEL LYMPH NODE  BX;  Surgeon: Earnstine Regal, MD;  Location: [place];  Service: General;  Laterality: Right;  . Portacath placement Left 01/02/2014    Procedure: INSERTION PORT-A-CATH;  Surgeon: Earnstine Regal, MD;  Location: [facility];  Service: General;  Laterality: Left;  . Laparotomy N/A 02/11/2014      Procedure: EXPLORATORY LAPAROTOMY, COLON RESECTION, COLOSTOMY;  Surgeon: Shann Medal, MD;  Location: [facility];  Service: General;  Laterality: N/A;  . Colonoscopy N/A 08/22/2014    Procedure: COLONOSCOPY;  Surgeon: Alphonsa Overall, MD;  Location: [facility];  Service: General;  Laterality: N/A;  . Colon resection N/A 09/15/2014    Procedure: Laparoscopic hand assisted colostomy reversal with resection of left colon and laparoscopic lysis of adhesions ;  Surgeon: Alphonsa Overall, MD;  Location: [facility];  Service: General;  Laterality: N/A;  . Port-a-cath removal Left 04/20/2015    Procedure: REMOVAL PORT-A-CATH;  Surgeon: Armandina Gemma, MD;  Location: [place];  Service: General;  Laterality: Left;  . Breast surgery Right 12/01/2013    ultrasound guided    FAMILY HISTORY Family History  Problem Relation Age of Onset  . Cancer Father     bladder & pancreatic  . Alzheimer's disease Mother   . Diabetes Brother   The patient's father died from pancreatic cancer the age of 23. The patient's mother is alive, age 55, with significant Alzheimer's disease. The patient had one brother, no sisters. There are no first-degree relatives with breast or ovarian cancer.  GYNECOLOGIC HISTORY:  No LMP recorded. Patient has had a hysterectomy. Menarche age 89. The patient is GX P0. She underwent hysterectomy with bilateral salpingo-oophorectomy in 1999. She used an estrogen patch for several years after that.   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 Her husband, Martha Evans, works as a Armed forces logistics/support/administrative officer. He has 3 children from a prior marriage, living in [place], [place], and both work. The patient attends a local [employer]    ADVANCED DIRECTIVES: Not in place   HEALTH MAINTENANCE: Social History  Substance Use Topics  . Smoking status: Never Smoker   . Smokeless tobacco: Never Used  . Alcohol Use: Yes     Comment: seldom      Colonoscopy:  PAP: Status post hysterectomy  Bone density:  Lipid panel:  Allergies  Allergen Reactions  . Codeine Sulfate Nausea And Vomiting  . Tramadol Nausea Only    Nausea     Current Outpatient Prescriptions  Medication Sig Dispense Refill  . loratadine (CLARITIN) 10 MG tablet Take 1 tablet by mouth daily.    Marland Kitchen omeprazole (PRILOSEC) 20 MG capsule Take 20 mg by mouth 2 (two) times daily before a meal.     . tamoxifen (NOLVADEX) 20 MG tablet Take 1 tablet (20 mg total) by mouth daily. 90 tablet 3  . telmisartan (MICARDIS) 40 MG tablet Take 40 mg by mouth at bedtime.      No current facility-administered medications for this visit.    OBJECTIVE: Middle-aged white woman who appears well Filed Vitals:   01/28/16 1151  BP: 129/61  Pulse: 75  Temp: 97.8 F (36.6 C)  Resp: 18     Body mass index is 31.91 kg/(m^2).    ECOG FS:0 - Asymptomatic  Sclerae unicteric, pupils round and equal Oropharynx clear and moist-- no thrush or other lesions No cervical or supraclavicular adenopathy Lungs no rales or rhonchi Heart regular rate and  rhythm Abd soft, nontender, positive bowel sounds MSK no focal spinal tenderness, no upper extremity lymphedema Neuro: nonfocal, well oriented, appropriate affect Breasts: The right breast is status post lumpectomy and radiation. There is no evidence of local recurrence. The right axilla is benign. The left breast is unremarkable    LAB RESULTS:  CMP     Component Value Date/Time   NA 140 01/28/2016 1136   NA 141 09/18/2014 0440   NA 141 12/31/2012   K 4.2 01/28/2016 1136   K 3.7 09/18/2014 0440   CL 102 09/18/2014 0440   CO2 26 01/28/2016 1136   CO2 28 09/18/2014 0440   GLUCOSE 125 01/28/2016 1136   GLUCOSE 107* 09/18/2014 0440   BUN 18.3 01/28/2016 1136   BUN 9 09/18/2014 0440   BUN 14 12/31/2012   CREATININE 1.1 01/28/2016 1136   CREATININE 0.87 09/18/2014 0440   CREATININE 0.8 12/31/2012   CALCIUM 9.2 01/28/2016 1136    CALCIUM 9.4 09/18/2014 0440   PROT 6.8 01/28/2016 1136   PROT 5.5* 02/15/2014 0329   ALBUMIN 3.7 01/28/2016 1136   ALBUMIN 2.2* 02/15/2014 0329   AST 17 01/28/2016 1136   AST 19 02/15/2014 0329   ALT 20 01/28/2016 1136   ALT 21 02/15/2014 0329   ALKPHOS 84 01/28/2016 1136   ALKPHOS 107 02/15/2014 0329   BILITOT 0.40 01/28/2016 1136   BILITOT 0.6 02/15/2014 0329   GFRNONAA 67* 09/18/2014 0440   GFRAA 78* 09/18/2014 0440    I No results found for: SPEP  Lab Results  Component Value Date   WBC 5.5 01/28/2016   NEUTROABS 3.8 01/28/2016   HGB 12.2 01/28/2016   HCT 37.3 01/28/2016   MCV 94.7 01/28/2016   PLT 157 01/28/2016      Chemistry      Component Value Date/Time   NA 140 01/28/2016 1136   NA 141 09/18/2014 0440   NA 141 12/31/2012   K 4.2 01/28/2016 1136   K 3.7 09/18/2014 0440   CL 102 09/18/2014 0440   CO2 26 01/28/2016 1136   CO2 28 09/18/2014 0440   BUN 18.3 01/28/2016 1136   BUN 9 09/18/2014 0440   BUN 14 12/31/2012   CREATININE 1.1 01/28/2016 1136   CREATININE 0.87 09/18/2014 0440   CREATININE 0.8 12/31/2012   GLU 94 12/31/2012      Component Value Date/Time   CALCIUM 9.2 01/28/2016 1136   CALCIUM 9.4 09/18/2014 0440   ALKPHOS 84 01/28/2016 1136   ALKPHOS 107 02/15/2014 0329   AST 17 01/28/2016 1136   AST 19 02/15/2014 0329   ALT 20 01/28/2016 1136   ALT 21 02/15/2014 0329   BILITOT 0.40 01/28/2016 1136   BILITOT 0.6 02/15/2014 0329       No results found for: LABCA2  No components found for: [id number]  No results for input(s): INR in the last 168 hours.  Urinalysis    Component Value Date/Time   COLORURINE YELLOW 02/17/2014 1639   APPEARANCEUR CLOUDY* 02/17/2014 1639   LABSPEC 1.016 02/17/2014 1639   PHURINE 5.0 02/17/2014 1639   GLUCOSEU NEGATIVE 02/17/2014 1639   HGBUR NEGATIVE 02/17/2014 1639   BILIRUBINUR negative 12/11/2015 1436   BILIRUBINUR NEGATIVE 02/17/2014 1639   KETONESUR NEGATIVE 02/17/2014 1639   PROTEINUR 30  12/11/2015 1436   PROTEINUR 30* 02/17/2014 1639   UROBILINOGEN 0.2 12/11/2015 1436   UROBILINOGEN 1.0 02/17/2014 1639   NITRITE negative 12/11/2015 1436   NITRITE NEGATIVE 02/17/2014 1639   LEUKOCYTESUR large (3+)* 12/11/2015  1436    STUDIES: Mm Diag Breast Tomo Bilateral  01/09/2016  CLINICAL DATA:  Patient with history right breast lumpectomy. EXAM: 2D DIGITAL DIAGNOSTIC BILATERAL MAMMOGRAM WITH CAD AND ADJUNCT TOMO COMPARISON:  Previous exam(s). ACR Breast Density Category b: There are scattered areas of fibroglandular density. FINDINGS: Postlumpectomy changes right breast. No concerning masses, calcifications or nonsurgical architectural distortion identified within either breast. Mammographic images were processed with CAD. IMPRESSION: No mammographic evidence for malignancy. RECOMMENDATION: Bilateral diagnostic mammography 1 year. I have discussed the findings and recommendations with the patient. Results were also provided in writing at the conclusion of the visit. If applicable, a reminder letter will be sent to the patient regarding the next appointment. BI-RADS CATEGORY  2: Benign. Electronically Signed   By: Lovey Newcomer M.D.   On: 01/09/2016 13:26    ASSESSMENT: 70 y.o. [place], [place] woman  (1) status post right breast biopsy 12/01/2013 for an invasive ductal carcinoma, grade 2 or 3, estrogen receptor 2% "positive", progesterone receptor 2% "positive", both with moderate staining intensity, with an MIB-1 of 84%, and no HER-2 amplification  (2) status post right lumpectomy 12/12/2013 ([id number]) for a pT1b pNX invasive ductal carcinoma, metaplastic, high-grade, estrogen and progesterone receptor negative, repeat HER-2 again negative. Because of prior reduction mammoplasty sentinel lymph node sampling was not performed  (3) adjuvant chemotherapy with cyclophosphamide and docetaxel started 10/07/3233, complicated by peripheral neuropathy, and interrupted after 2 cycles because of  diverticular perforation requiring partial colectomy (02/13/2014)  (4) completed 2 additional cycles of chemotherapy 05/22/2014, consisting of carboplatin given day 1 and gemcitabine days 1 and 8 of each 21 day cycle, with Neulasta on day 9.  (5) adjuvant radiation under Dr. Berton Mount followed chemotherapy  (6) tamoxifen started January 2016 stopped 2 weeks into drug;  restarted in February 2016  (7) diverticulitis, status post partial colectomy with colostomy placement may 05/18/2014, status post colostomy takedown 09/15/2014  PLAN: Niaomi is now 2 years out from definitive surgery or her breast cancer with no evidence of disease recurrence. This is very favorable.  She is tolerating the tamoxifen moderately well. She does tend to blame any symptoms she experiences to tamoxifen, for example her weight gain. I think she will need some encouragement in continuing this medication. Today we did discuss diet changes she can make an we also talked about the [facility].  I prescribed micro-estrogen suppositories for her to use for the vaginal dryness. Hopefully this will take care of the problem with urinary tract infections.  She will return to see me in 6 months. She knows to call for any problems that may develop before her next visit here.   Chauncey Cruel, MD  01/28/2016 12:24 PM

## 2016-01-28 ENCOUNTER — Other Ambulatory Visit (HOSPITAL_BASED_OUTPATIENT_CLINIC_OR_DEPARTMENT_OTHER): Payer: PPO

## 2016-01-28 ENCOUNTER — Other Ambulatory Visit: Payer: Self-pay | Admitting: *Deleted

## 2016-01-28 ENCOUNTER — Ambulatory Visit (HOSPITAL_BASED_OUTPATIENT_CLINIC_OR_DEPARTMENT_OTHER): Payer: PPO | Admitting: Oncology

## 2016-01-28 ENCOUNTER — Telehealth: Payer: Self-pay | Admitting: Oncology

## 2016-01-28 VITALS — BP 129/61 | HR 75 | Temp 97.8°F | Resp 18 | Ht 66.0 in | Wt 197.6 lb

## 2016-01-28 DIAGNOSIS — C50511 Malignant neoplasm of lower-outer quadrant of right female breast: Secondary | ICD-10-CM

## 2016-01-28 DIAGNOSIS — Z170 Estrogen receptor positive status [ER+]: Secondary | ICD-10-CM

## 2016-01-28 DIAGNOSIS — C50111 Malignant neoplasm of central portion of right female breast: Secondary | ICD-10-CM

## 2016-01-28 DIAGNOSIS — Z87440 Personal history of urinary (tract) infections: Secondary | ICD-10-CM | POA: Diagnosis not present

## 2016-01-28 LAB — CBC WITH DIFFERENTIAL/PLATELET
BASO%: 0.6 % (ref 0.0–2.0)
BASOS ABS: 0 10*3/uL (ref 0.0–0.1)
EOS%: 3.1 % (ref 0.0–7.0)
Eosinophils Absolute: 0.2 10*3/uL (ref 0.0–0.5)
HEMATOCRIT: 37.3 % (ref 34.8–46.6)
HEMOGLOBIN: 12.2 g/dL (ref 11.6–15.9)
LYMPH#: 1.1 10*3/uL (ref 0.9–3.3)
LYMPH%: 20.9 % (ref 14.0–49.7)
MCH: 31 pg (ref 25.1–34.0)
MCHC: 32.8 g/dL (ref 31.5–36.0)
MCV: 94.7 fL (ref 79.5–101.0)
MONO#: 0.4 10*3/uL (ref 0.1–0.9)
MONO%: 7 % (ref 0.0–14.0)
NEUT#: 3.8 10*3/uL (ref 1.5–6.5)
NEUT%: 68.4 % (ref 38.4–76.8)
PLATELETS: 157 10*3/uL (ref 145–400)
RBC: 3.95 10*6/uL (ref 3.70–5.45)
RDW: 13.4 % (ref 11.2–14.5)
WBC: 5.5 10*3/uL (ref 3.9–10.3)

## 2016-01-28 LAB — COMPREHENSIVE METABOLIC PANEL
ALBUMIN: 3.7 g/dL (ref 3.5–5.0)
ALK PHOS: 84 U/L (ref 40–150)
ALT: 20 U/L (ref 0–55)
ANION GAP: 7 meq/L (ref 3–11)
AST: 17 U/L (ref 5–34)
BUN: 18.3 mg/dL (ref 7.0–26.0)
CALCIUM: 9.2 mg/dL (ref 8.4–10.4)
CO2: 26 mEq/L (ref 22–29)
CREATININE: 1.1 mg/dL (ref 0.6–1.1)
Chloride: 107 mEq/L (ref 98–109)
EGFR: 50 mL/min/{1.73_m2} — ABNORMAL LOW (ref >90)
Glucose: 125 mg/dl (ref 70–140)
POTASSIUM: 4.2 meq/L (ref 3.5–5.1)
Sodium: 140 mEq/L (ref 136–145)
Total Bilirubin: 0.4 mg/dL (ref 0.20–1.20)
Total Protein: 6.8 g/dL (ref 6.4–8.3)

## 2016-01-28 MED ORDER — ESTRADIOL 10 MCG VA TABS: 1.0000 | ORAL_TABLET | VAGINAL | Status: DC

## 2016-01-28 NOTE — Telephone Encounter (Signed)
appt made and avs printed °

## 2016-04-09 DIAGNOSIS — K219 Gastro-esophageal reflux disease without esophagitis: Secondary | ICD-10-CM | POA: Diagnosis not present

## 2016-04-09 DIAGNOSIS — I517 Cardiomegaly: Secondary | ICD-10-CM | POA: Diagnosis not present

## 2016-04-09 DIAGNOSIS — E7800 Pure hypercholesterolemia, unspecified: Secondary | ICD-10-CM | POA: Diagnosis not present

## 2016-04-09 DIAGNOSIS — C50911 Malignant neoplasm of unspecified site of right female breast: Secondary | ICD-10-CM | POA: Diagnosis not present

## 2016-04-09 DIAGNOSIS — I10 Essential (primary) hypertension: Secondary | ICD-10-CM | POA: Diagnosis not present

## 2016-04-28 ENCOUNTER — Ambulatory Visit (INDEPENDENT_AMBULATORY_CARE_PROVIDER_SITE_OTHER): Payer: PPO | Admitting: Family Medicine

## 2016-04-28 ENCOUNTER — Encounter: Payer: Self-pay | Admitting: Family Medicine

## 2016-04-28 VITALS — BP 122/70 | HR 64 | Temp 97.5°F | Wt 198.2 lb

## 2016-04-28 DIAGNOSIS — N3090 Cystitis, unspecified without hematuria: Secondary | ICD-10-CM

## 2016-04-28 LAB — POCT URINALYSIS DIPSTICK
Bilirubin, UA: NEGATIVE
Glucose, UA: NEGATIVE
KETONES UA: NEGATIVE
Nitrite, UA: NEGATIVE
PH UA: 5
RBC UA: NEGATIVE
SPEC GRAV UA: 1.015
Urobilinogen, UA: 0.2

## 2016-04-28 MED ORDER — SULFAMETHOXAZOLE-TRIMETHOPRIM 800-160 MG PO TABS: 1.0000 | ORAL_TABLET | Freq: Two times a day (BID) | ORAL | 0 refills | Status: DC

## 2016-04-28 NOTE — Patient Instructions (Signed)
We will call you with the culture report. 

## 2016-04-28 NOTE — Progress Notes (Signed)
Subjective:     Patient ID: Martha Evans, female   DOB: 1946-04-22, 70 y.o.   MRN: [id number]  HPI  Chief Complaint  Patient presents with  . Urinary Retention    Patient comes in office today with concerns of a possible urinary infection. Patient states for the past 3-4 weeks she has had difficulty voided and is unable to fully empty bladder, patient also reports strong foul odor.   States she could not afford estrogen tabs provided by her oncologist due to her anti-estrogen therapy.   Review of Systems     Objective:   Physical Exam  Constitutional: She appears well-developed and well-nourished. No distress.  Genitourinary:  Genitourinary Comments: No cva tenderness       Assessment:    1. Cystitis - Urine culture - POCT urinalysis dipstick - sulfamethoxazole-trimethoprim (BACTRIM DS,SEPTRA DS) 800-160 MG tablet; Take 1 tablet by mouth 2 (two) times daily.  Dispense: 14 tablet; Refill: 0    Plan:    Further f/u pending culture report.

## 2016-04-30 LAB — URINE CULTURE

## 2016-05-01 ENCOUNTER — Telehealth: Payer: Self-pay

## 2016-05-01 ENCOUNTER — Other Ambulatory Visit: Payer: Self-pay | Admitting: Family Medicine

## 2016-05-01 DIAGNOSIS — N3090 Cystitis, unspecified without hematuria: Secondary | ICD-10-CM

## 2016-05-01 MED ORDER — NITROFURANTOIN MONOHYD MACRO 100 MG PO CAPS: 100.0000 mg | ORAL_CAPSULE | Freq: Two times a day (BID) | ORAL | 0 refills | Status: DC

## 2016-05-01 NOTE — Telephone Encounter (Signed)
-----   Message from Carmon Ginsberg, [place] sent at 05/01/2016  7:38 AM EDT ----- Your E. Coli infection is resistant to the antibiotic you are on. I will switch to Macrobid.

## 2016-05-01 NOTE — Telephone Encounter (Signed)
Patient has been advised as below. KW

## 2016-05-02 ENCOUNTER — Telehealth: Payer: Self-pay

## 2016-05-02 ENCOUNTER — Other Ambulatory Visit: Payer: Self-pay | Admitting: Family Medicine

## 2016-05-02 DIAGNOSIS — N3090 Cystitis, unspecified without hematuria: Secondary | ICD-10-CM

## 2016-05-02 MED ORDER — CEPHALEXIN 500 MG PO CAPS: 500.0000 mg | ORAL_CAPSULE | Freq: Two times a day (BID) | ORAL | 0 refills | Status: DC

## 2016-05-02 NOTE — Telephone Encounter (Signed)
Patient was seen in office on 7/31, after I advised her of urine culture results this week, you had changed Rx to [place].According to pharmacy insurance will not conver medication without a pre-authorization form, which I requested yesterday and still have yet to receive. Would you like to prescribe a different Rx? KW

## 2016-05-02 NOTE — Telephone Encounter (Signed)
Patient has been advised. KW 

## 2016-05-02 NOTE — Telephone Encounter (Signed)
I have changed her rx to  Keflex and sent it in

## 2016-05-21 ENCOUNTER — Telehealth: Payer: Self-pay

## 2016-05-21 NOTE — Telephone Encounter (Signed)
Pt called c/o lesions beneath right eye and in right temporal area that feel "scratchy and burning". Has had shingles in the past and reports it feels similar. Has appointment with you tomorrow 05/22/2016 at 2:00 pm. Renaldo Fiddler, CMA

## 2016-05-22 ENCOUNTER — Ambulatory Visit (INDEPENDENT_AMBULATORY_CARE_PROVIDER_SITE_OTHER): Payer: PPO | Admitting: Family Medicine

## 2016-05-22 ENCOUNTER — Encounter: Payer: Self-pay | Admitting: Family Medicine

## 2016-05-22 VITALS — BP 112/50 | HR 112 | Temp 97.7°F | Resp 18 | Wt 195.0 lb

## 2016-05-22 DIAGNOSIS — B029 Zoster without complications: Secondary | ICD-10-CM | POA: Diagnosis not present

## 2016-05-22 DIAGNOSIS — B0230 Zoster ocular disease, unspecified: Secondary | ICD-10-CM | POA: Diagnosis not present

## 2016-05-22 MED ORDER — VALACYCLOVIR HCL 1 G PO TABS: 1000.0000 mg | ORAL_TABLET | Freq: Three times a day (TID) | ORAL | 0 refills | Status: DC

## 2016-05-22 NOTE — Progress Notes (Signed)
Subjective:     Patient ID: Martha Evans, female   DOB: 03/08/1946, 70 y.o.   MRN: [id number]  HPI  Chief Complaint  Patient presents with  . Rash    Patient comes in office today with concerns of rash on her forehead that has been present for the apst 3-4days. Patient describes rash as itchy and tingling, patient reports she had shingles outbreak roughly around 2012-2013 and fears that she is having another outbreak. Zostavax was administered to patient on 12/31/12.  Marrian Salvage [facility]    Patient complains of redness and itching of the right eye for the past 2-3 days. Patient denies any drainiage and states that eye has been swollen at times and very itchy.  . Neck Pain    Patient complains of neck pains described as burning on her left side. Patient states that pain has been persistent for the past 2-3 weeks and is radiating down into her shoulder.   States vision ok.   Review of Systems     Objective:   Physical Exam  Constitutional: She appears well-developed and well-nourished. No distress.  HENT:  Right eye without drainage at the present time.  Skin:  Right forehead with erythematous patch with ?evolving vesicles.       Assessment:    1. Shingles - valACYclovir (VALTREX) 1000 MG tablet; Take 1 tablet (1,000 mg total) by mouth 3 (three) times daily.  Dispense: 21 tablet; Refill: 0 - Ambulatory referral to Ophthalmology    Plan:    Further f/u pending eye exam.

## 2016-05-22 NOTE — Patient Instructions (Signed)
Start the Valtrex today. We will get you in with the eye doctor as soon as possible.

## 2016-06-24 DIAGNOSIS — B0230 Zoster ocular disease, unspecified: Secondary | ICD-10-CM | POA: Diagnosis not present

## 2016-07-05 ENCOUNTER — Encounter: Payer: Self-pay | Admitting: Physician Assistant

## 2016-07-05 ENCOUNTER — Ambulatory Visit (INDEPENDENT_AMBULATORY_CARE_PROVIDER_SITE_OTHER): Payer: PPO | Admitting: Physician Assistant

## 2016-07-05 VITALS — BP 120/84 | HR 95 | Temp 98.2°F | Resp 16 | Wt 198.0 lb

## 2016-07-05 DIAGNOSIS — J0100 Acute maxillary sinusitis, unspecified: Secondary | ICD-10-CM

## 2016-07-05 DIAGNOSIS — R519 Headache, unspecified: Secondary | ICD-10-CM

## 2016-07-05 DIAGNOSIS — H6503 Acute serous otitis media, bilateral: Secondary | ICD-10-CM | POA: Diagnosis not present

## 2016-07-05 DIAGNOSIS — R51 Headache: Secondary | ICD-10-CM | POA: Diagnosis not present

## 2016-07-05 MED ORDER — IBUPROFEN 800 MG PO TABS: 800.0000 mg | ORAL_TABLET | Freq: Three times a day (TID) | ORAL | 0 refills | Status: DC | PRN

## 2016-07-05 MED ORDER — AMOXICILLIN 875 MG PO TABS: 875.0000 mg | ORAL_TABLET | Freq: Two times a day (BID) | ORAL | 0 refills | Status: DC

## 2016-07-05 NOTE — Patient Instructions (Signed)

## 2016-07-05 NOTE — Progress Notes (Signed)
Patient: Martha Evans Female    DOB: 1946-03-27   70 y.o.   MRN: [id number] Visit Date: 07/05/2016  Today's Provider: Mar Daring, PA-C   Chief Complaint  Patient presents with  . URI   Subjective:    URI   This is a new problem. The current episode started in the past 7 days (x 4 days). The problem has been gradually worsening. There has been no fever. Associated symptoms include chest pain, congestion, coughing (productive with yellow sputum, sometimes green), headaches, a plugged ear sensation, rhinorrhea, sinus pain (pressure), sneezing and a sore throat. Pertinent negatives include no abdominal pain, diarrhea, dysuria, ear pain, nausea, swollen glands, vomiting or wheezing. Associated symptoms comments: Pt also c/o post nasal drainage, and believes her cough may be coming from this. Treatments tried: NyQuil, salt water gargles. The treatment provided mild relief.  Pt has a H/O fall seasonal allergies, but reports her current sx are worse. Is concerned because she is leaving for [facility] tomorrow, and care is not easily accessible there.     Allergies  Allergen Reactions  . Codeine Sulfate Nausea And Vomiting  . Tramadol Nausea Only    Nausea      Current Outpatient Prescriptions:  .  loratadine (CLARITIN) 10 MG tablet, Take 1 tablet by mouth daily., Disp: , Rfl:  .  omeprazole (PRILOSEC) 20 MG capsule, Take 20 mg by mouth 2 (two) times daily before a meal. , Disp: , Rfl:  .  tamoxifen (NOLVADEX) 20 MG tablet, Take 1 tablet (20 mg total) by mouth daily., Disp: 90 tablet, Rfl: 3 .  telmisartan (MICARDIS) 40 MG tablet, Take 40 mg by mouth at bedtime. , Disp: , Rfl:   Review of Systems  Constitutional: Negative.   HENT: Positive for congestion, rhinorrhea, sneezing and sore throat. Negative for ear pain.   Respiratory: Positive for cough (productive with yellow sputum, sometimes green). Negative for wheezing.   Cardiovascular: Positive for chest pain.    Gastrointestinal: Negative for abdominal pain, diarrhea, nausea and vomiting.  Genitourinary: Negative for dysuria.  Neurological: Positive for headaches. Negative for dizziness.    Social History  Substance Use Topics  . Smoking status: Never Smoker  . Smokeless tobacco: Never Used  . Alcohol use Yes     Comment: seldom   Objective:   BP 120/84 (BP Location: Left Arm, Patient Position: Sitting, Cuff Size: Large)   Pulse 95   Temp 98.2 F (36.8 C) (Oral)   Resp 16   Wt 198 lb (89.8 kg)   SpO2 96%   BMI 31.96 kg/m   Physical Exam  Constitutional: She appears well-developed and well-nourished. No distress.  HENT:  Head: Normocephalic and atraumatic.  Right Ear: Hearing, external ear and ear canal normal. Tympanic membrane is bulging. Tympanic membrane is not perforated and not erythematous. A middle ear effusion (clear) is present.  Left Ear: Hearing, external ear and ear canal normal. Tympanic membrane is bulging. Tympanic membrane is not perforated and not erythematous. A middle ear effusion (clear) is present.  Nose: Mucosal edema present. No rhinorrhea. Right sinus exhibits maxillary sinus tenderness. Right sinus exhibits no frontal sinus tenderness. Left sinus exhibits maxillary sinus tenderness. Left sinus exhibits no frontal sinus tenderness.  Mouth/Throat: Uvula is midline, oropharynx is clear and moist and mucous membranes are normal. No oropharyngeal exudate, posterior oropharyngeal edema or posterior oropharyngeal erythema.  Neck: Normal range of motion. Neck supple. No tracheal deviation present. No thyromegaly present.  Cardiovascular: Normal rate, regular rhythm and normal heart sounds.  Exam reveals no gallop and no friction rub.   No murmur heard. Pulmonary/Chest: Effort normal and breath sounds normal. No stridor. No respiratory distress. She has no wheezes. She has no rales.  Lymphadenopathy:    She has no cervical adenopathy.  Skin: She is not diaphoretic.   Vitals reviewed.     Assessment & Plan:     1. Acute non-recurrent maxillary sinusitis Worsening symptoms that have not responded to OTC medications. Will give Amoxil as below. Continue allergy medications. Stay well hydrated and get plenty of rest. Call if no symptom improvement or if symptoms worsen. - amoxicillin (AMOXIL) 875 MG tablet; Take 1 tablet (875 mg total) by mouth 2 (two) times daily.  Dispense: 14 tablet; Refill: 0  2. Nonintractable episodic headache, unspecified headache type Ibuprofen given for sinus headache and back pain. - ibuprofen (ADVIL,MOTRIN) 800 MG tablet; Take 1 tablet (800 mg total) by mouth every 8 (eight) hours as needed.  Dispense: 30 tablet; Refill: 0  3. Bilateral acute serous otitis media, recurrence not specified Continue flonase. Call if symptoms worsen.       Mar Daring, PA-C  [facility]

## 2016-07-30 ENCOUNTER — Other Ambulatory Visit (HOSPITAL_BASED_OUTPATIENT_CLINIC_OR_DEPARTMENT_OTHER): Payer: PPO

## 2016-07-30 DIAGNOSIS — C50111 Malignant neoplasm of central portion of right female breast: Secondary | ICD-10-CM

## 2016-07-30 DIAGNOSIS — C50511 Malignant neoplasm of lower-outer quadrant of right female breast: Secondary | ICD-10-CM

## 2016-07-30 LAB — CBC WITH DIFFERENTIAL/PLATELET
BASO%: 0.6 % (ref 0.0–2.0)
BASOS ABS: 0 10*3/uL (ref 0.0–0.1)
EOS ABS: 0.2 10*3/uL (ref 0.0–0.5)
EOS%: 4.1 % (ref 0.0–7.0)
HEMATOCRIT: 36.1 % (ref 34.8–46.6)
HEMOGLOBIN: 11.8 g/dL (ref 11.6–15.9)
LYMPH#: 1.3 10*3/uL (ref 0.9–3.3)
LYMPH%: 22.8 % (ref 14.0–49.7)
MCH: 31.4 pg (ref 25.1–34.0)
MCHC: 32.6 g/dL (ref 31.5–36.0)
MCV: 96.3 fL (ref 79.5–101.0)
MONO#: 0.5 10*3/uL (ref 0.1–0.9)
MONO%: 9.6 % (ref 0.0–14.0)
NEUT%: 62.9 % (ref 38.4–76.8)
NEUTROS ABS: 3.5 10*3/uL (ref 1.5–6.5)
PLATELETS: 143 10*3/uL — AB (ref 145–400)
RBC: 3.75 10*6/uL (ref 3.70–5.45)
RDW: 13.5 % (ref 11.2–14.5)
WBC: 5.6 10*3/uL (ref 3.9–10.3)

## 2016-07-30 LAB — COMPREHENSIVE METABOLIC PANEL
ALBUMIN: 3.5 g/dL (ref 3.5–5.0)
ALK PHOS: 107 U/L (ref 40–150)
ALT: 30 U/L (ref 0–55)
AST: 25 U/L (ref 5–34)
Anion Gap: 7 mEq/L (ref 3–11)
BILIRUBIN TOTAL: 0.53 mg/dL (ref 0.20–1.20)
BUN: 20.6 mg/dL (ref 7.0–26.0)
CALCIUM: 9 mg/dL (ref 8.4–10.4)
CO2: 24 mEq/L (ref 22–29)
Chloride: 110 mEq/L — ABNORMAL HIGH (ref 98–109)
Creatinine: 0.9 mg/dL (ref 0.6–1.1)
EGFR: 70 mL/min/{1.73_m2} — AB (ref >90)
Glucose: 93 mg/dl (ref 70–140)
POTASSIUM: 4.6 meq/L (ref 3.5–5.1)
Sodium: 141 mEq/L (ref 136–145)
TOTAL PROTEIN: 6.7 g/dL (ref 6.4–8.3)

## 2016-08-06 ENCOUNTER — Ambulatory Visit (HOSPITAL_BASED_OUTPATIENT_CLINIC_OR_DEPARTMENT_OTHER): Payer: PPO | Admitting: Oncology

## 2016-08-06 ENCOUNTER — Ambulatory Visit (HOSPITAL_COMMUNITY)
Admission: RE | Admit: 2016-08-06 | Discharge: 2016-08-06 | Disposition: A | Discharge status: Home / Self Care | Payer: PPO | Source: Ambulatory Visit | Attending: Oncology | Admitting: Oncology

## 2016-08-06 ENCOUNTER — Other Ambulatory Visit: Payer: Self-pay | Admitting: *Deleted

## 2016-08-06 VITALS — BP 139/62 | HR 70 | Temp 97.7°F | Resp 18 | Ht 66.0 in | Wt 196.6 lb

## 2016-08-06 DIAGNOSIS — C50511 Malignant neoplasm of lower-outer quadrant of right female breast: Secondary | ICD-10-CM | POA: Diagnosis not present

## 2016-08-06 DIAGNOSIS — R05 Cough: Secondary | ICD-10-CM

## 2016-08-06 DIAGNOSIS — R059 Cough, unspecified: Secondary | ICD-10-CM

## 2016-08-06 DIAGNOSIS — R5383 Other fatigue: Secondary | ICD-10-CM | POA: Diagnosis not present

## 2016-08-06 DIAGNOSIS — Z170 Estrogen receptor positive status [ER+]: Principal | ICD-10-CM

## 2016-08-06 DIAGNOSIS — E041 Nontoxic single thyroid nodule: Secondary | ICD-10-CM

## 2016-08-06 DIAGNOSIS — Z79810 Long term (current) use of selective estrogen receptor modulators (SERMs): Secondary | ICD-10-CM

## 2016-08-06 MED ORDER — TAMOXIFEN CITRATE 20 MG PO TABS: 20.0000 mg | ORAL_TABLET | Freq: Every day | ORAL | 3 refills | Status: DC

## 2016-08-06 NOTE — Progress Notes (Signed)
[place]  [phone] [phone]     ID: Martha Evans DOB: [birth year]  MR#: [id number]  YQI#:[id number]  Patient Care Team: Birdie Sons, MD as PCP - General (Family Medicine) Teodoro Spray, MD as Consulting Physician (Cardiology) Armandina Gemma, MD as Consulting Physician (General Surgery) Thea Silversmith, MD as Consulting Physician (Radiation Oncology) Chauncey Cruel, MD as Consulting Physician (Oncology) Noreene Filbert, MD as Consulting Physician Rod Can, MD as Consulting Physician (Orthopedic Surgery)  CHIEF COMPLAINT: Metaplastic breast cancer  CURRENT TREATMENT:  Tamoxifen  BREAST CANCER HISTORY: From a Dr. Laurelyn Sickle initial intake note 12/21/2013:  "Martha Evans is a 70 y.o. female. Would medical history significant for hypertension gastroesophageal reflux disease. Patient underwent bilateral breast reductions in the 1980s. In February 2015 she had screening mammograms performed that showed a mass in the central right breast. She had ultrasound performed which revealed a 6 x 4 x 6 mm high-grade calcifications. Biopsy of these calcifications revealed invasive ductal carcinoma. On 12/12/2013 patient underwent a lumpectomy without sentinel lymph node biopsy. 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 Tumor was ER +2% PR +2% HER-2/neu negative with an elevated Ki-67 of 84%. Of note patient [could not] undergo a sentinel lymph node biopsy because of previous breast reduction."  The patient was started on adjuvant chemotherapy with cyclophosphamide and docetaxel 01/16/2014, but treatment was interrupted by a perforated diverticulum requiring partial colectomy 02/13/2014.   Her subsequent history is as detailed below  INTERVAL HISTORY: Fredrika returns today for follow-up of her breast cancer. She continues on tamoxifen, which she is tolerating better. She has occasional hot flashes. They're not a  major concern. The vaginal discharge has subsided and she tells me she is not having frequent urinary tract infections anymore although she never did try vaginal estrogens because of cost.  REVIEW OF SYSTEMS: Laverda had shingles involving the back of her neck. This was treated appropriately but that area still feels different. It isn't exactly a pain but more like a heat. She has had a cough now for several months she says. She had a cold about a month ago but the cough was present even before she had the cold. Those other symptoms have resolved but she still has a frequent dry cough and complains of shortness of breath at times he can feel exhausted and she wonders if her thyroid is off. She is concerned that she was found to have thyroid nodules remotely and that has not been followed up and she says. She is having normal bowel movements, with no blood, melena, constipation or diarrhea. Aside from these issues a detailed review of systems today was stable  PAST MEDICAL HISTORY: Past Medical History:  Diagnosis Date  . Allergy   . Cancer [facility])    breast cancer right breast  . Complication of anesthesia    was hard to intubate with thyroid surgery-99  . Difficult intubation 1999   when had thyroid lobectomy  . Dysrhythmia    irregular reuglar heart beat   . GERD (gastroesophageal reflux disease)   . History of chicken pox   . History of measles   . Hypertension   . Wears hearing aid    right    PAST SURGICAL HISTORY: Past Surgical History:  Procedure Laterality Date  . ABDOMINAL HYSTERECTOMY  1999  . APPENDECTOMY     as child  . BREAST REDUCTION SURGERY Bilateral    1980's  . BREAST SURGERY  Right 12/01/2013   ultrasound guided  . COLON RESECTION N/A 09/15/2014   Procedure: Laparoscopic hand assisted colostomy reversal with resection of left colon and laparoscopic lysis of adhesions ;  Surgeon: Alphonsa Overall, MD;  Location: [facility];  Service: General;  Laterality: N/A;  .  COLONOSCOPY    . COLONOSCOPY N/A 08/22/2014   Procedure: COLONOSCOPY;  Surgeon: Alphonsa Overall, MD;  Location: [facility];  Service: General;  Laterality: N/A;  . LAPAROTOMY N/A 02/11/2014   Procedure: EXPLORATORY LAPAROTOMY, COLON RESECTION, COLOSTOMY;  Surgeon: Shann Medal, MD;  Location: [facility];  Service: General;  Laterality: N/A;  . PARTIAL MASTECTOMY WITH NEEDLE LOCALIZATION AND AXILLARY SENTINEL LYMPH NODE BX Right 12/12/2013   Procedure: PARTIAL MASTECTOMY WITH NEEDLE LOCALIZATION AND AXILLARY SENTINEL LYMPH NODE BX;  Surgeon: Earnstine Regal, MD;  Location: [place];  Service: General;  Laterality: Right;  . PORT-A-CATH REMOVAL Left 04/20/2015   Procedure: REMOVAL PORT-A-CATH;  Surgeon: Armandina Gemma, MD;  Location: [place];  Service: General;  Laterality: Left;  . PORTACATH PLACEMENT Left 01/02/2014   Procedure: INSERTION PORT-A-CATH;  Surgeon: Earnstine Regal, MD;  Location: [facility];  Service: General;  Laterality: Left;  . ROTATOR CUFF REPAIR  2006   right shoulder  . THYROID LOBECTOMY  1999   right  . TONSILLECTOMY     as child    FAMILY HISTORY Family History  Problem Relation Age of Onset  . Cancer Father     bladder & pancreatic  . Alzheimer's disease Mother   . Diabetes Brother   The patient's father died from pancreatic cancer the age of 28. The patient's mother is alive, age 76, with significant Alzheimer's disease. The patient had one brother, no sisters. There are no first-degree relatives with breast or ovarian cancer.  GYNECOLOGIC HISTORY:  No LMP recorded. Patient has had a hysterectomy. Menarche age 69. The patient is GX P0. She underwent hysterectomy with bilateral salpingo-oophorectomy in 1999. She used an estrogen patch for several years after that.   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 Her husband, Lendon Collar, works as a Armed forces logistics/support/administrative officer. He has 3 children from a prior marriage,  living in [place], [place], and both work. The patient attends a local [employer]    ADVANCED DIRECTIVES: Not in place   HEALTH MAINTENANCE: Social History  Substance Use Topics  . Smoking status: Never Smoker  . Smokeless tobacco: Never Used  . Alcohol use Yes     Comment: seldom     Colonoscopy:  PAP: Status post hysterectomy  Bone density:  Lipid panel:  Allergies  Allergen Reactions  . Codeine Sulfate Nausea And Vomiting  . Tramadol Nausea Only    Nausea     Current Outpatient Prescriptions  Medication Sig Dispense Refill  . ibuprofen (ADVIL,MOTRIN) 800 MG tablet Take 1 tablet (800 mg total) by mouth every 8 (eight) hours as needed. 30 tablet 0  . loratadine (CLARITIN) 10 MG tablet Take 1 tablet by mouth daily.    Marland Kitchen omeprazole (PRILOSEC) 20 MG capsule Take 20 mg by mouth 2 (two) times daily before a meal.     . tamoxifen (NOLVADEX) 20 MG tablet Take 1 tablet (20 mg total) by mouth daily. 90 tablet 3  . telmisartan (MICARDIS) 40 MG tablet Take 40 mg by mouth at bedtime.      No current facility-administered medications for this visit.  OBJECTIVE: Middle-aged white woman  Vitals:   08/06/16 1152  BP: 139/62  Pulse: 70  Resp: 18  Temp: 97.7 F (36.5 C)     Body mass index is 31.73 kg/m.    ECOG FS:1 - Symptomatic but completely ambulatory  Sclerae unicteric, EOMs intact Oropharynx clear and moist No cervical or supraclavicular adenopathy Lungs no rales or rhonchi Heart regular rate and rhythm Abd soft, nontender, positive bowel sounds MSK no focal spinal tenderness, no upper extremity lymphedema Neuro: nonfocal, well oriented, appropriate affect Breasts: The right breast status post lumpectomy followed by radiation. There is no evidence of local recurrence. The right axilla is benign. The left breast is unremarkable.     LAB RESULTS:  CMP     Component Value Date/Time   NA 141 07/30/2016 1137   K 4.6 07/30/2016 1137   CL 102  09/18/2014 0440   CO2 24 07/30/2016 1137   GLUCOSE 93 07/30/2016 1137   BUN 20.6 07/30/2016 1137   CREATININE 0.9 07/30/2016 1137   CALCIUM 9.0 07/30/2016 1137   PROT 6.7 07/30/2016 1137   ALBUMIN 3.5 07/30/2016 1137   AST 25 07/30/2016 1137   ALT 30 07/30/2016 1137   ALKPHOS 107 07/30/2016 1137   BILITOT 0.53 07/30/2016 1137   GFRNONAA 67 (L) 09/18/2014 0440   GFRAA 78 (L) 09/18/2014 0440    I No results found for: SPEP  Lab Results  Component Value Date   WBC 5.6 07/30/2016   NEUTROABS 3.5 07/30/2016   HGB 11.8 07/30/2016   HCT 36.1 07/30/2016   MCV 96.3 07/30/2016   PLT 143 (L) 07/30/2016      Chemistry      Component Value Date/Time   NA 141 07/30/2016 1137   K 4.6 07/30/2016 1137   CL 102 09/18/2014 0440   CO2 24 07/30/2016 1137   BUN 20.6 07/30/2016 1137   CREATININE 0.9 07/30/2016 1137   GLU 94 12/31/2012      Component Value Date/Time   CALCIUM 9.0 07/30/2016 1137   ALKPHOS 107 07/30/2016 1137   AST 25 07/30/2016 1137   ALT 30 07/30/2016 1137   BILITOT 0.53 07/30/2016 1137       No results found for: LABCA2  No components found for: LABCA125  No results for input(s): INR in the last 168 hours.  Urinalysis    Component Value Date/Time   COLORURINE YELLOW 02/17/2014 1639   APPEARANCEUR CLOUDY (A) 02/17/2014 1639   LABSPEC 1.016 02/17/2014 1639   PHURINE 5.0 02/17/2014 1639   GLUCOSEU NEGATIVE 02/17/2014 1639   HGBUR NEGATIVE 02/17/2014 1639   BILIRUBINUR negative 04/28/2016 1159   KETONESUR NEGATIVE 02/17/2014 1639   PROTEINUR trace 04/28/2016 1159   PROTEINUR 30 (A) 02/17/2014 1639   UROBILINOGEN 0.2 04/28/2016 1159   UROBILINOGEN 1.0 02/17/2014 1639   NITRITE negative 04/28/2016 1159   NITRITE NEGATIVE 02/17/2014 1639   LEUKOCYTESUR moderate (2+) (A) 04/28/2016 1159    STUDIES: CLINICAL DATA:  Patient with history right breast lumpectomy.  EXAM: 2D DIGITAL DIAGNOSTIC BILATERAL MAMMOGRAM WITH CAD AND ADJUNCT TOMO  COMPARISON:   Previous exam(s).  ACR Breast Density Category b: There are scattered areas of fibroglandular density.  FINDINGS: Postlumpectomy changes right breast. No concerning masses, calcifications or nonsurgical architectural distortion identified within either breast.  Mammographic images were processed with CAD.  IMPRESSION: No mammographic evidence for malignancy.  RECOMMENDATION: Bilateral diagnostic mammography 1 year.  I have discussed the findings and recommendations with the patient. Results were also provided in  writing at the conclusion of the visit. If applicable, a reminder letter will be sent to the patient regarding the next appointment.  BI-RADS CATEGORY  2: Benign.   Electronically Signed   By: Lovey Newcomer M.D.   On: 01/09/2016 13:26  ASSESSMENT: 70 y.o. [place], [place] woman  (1) status post right breast Lower outer quadrant biopsy 12/01/2013 for an invasive ductal carcinoma, grade 2 or 3, estrogen receptor 2% "positive", progesterone receptor 2% "positive", both with moderate staining intensity, with an MIB-1 of 84%, and no HER-2 amplification  (2) status post right lumpectomy 12/12/2013 ([id number]) for a pT1b pNX invasive ductal carcinoma, metaplastic, high-grade, estrogen and progesterone receptor negative, repeat HER-2 again negative. Because of prior reduction mammoplasty sentinel lymph node sampling was not performed  (3) adjuvant chemotherapy with cyclophosphamide and docetaxel started 76/28/3151, complicated by peripheral neuropathy, and interrupted after 2 cycles because of diverticular perforation requiring partial colectomy (02/13/2014)  (4) completed 2 additional cycles of chemotherapy 05/22/2014, consisting of carboplatin given day 1 and gemcitabine days 1 and 8 of each 21 day cycle, with Neulasta on day 9.  (5) adjuvant radiation under Dr. Berton Mount followed chemotherapy  (6) tamoxifen started January 2016 stopped 2 weeks into drug;   restarted in February 2016  (7) diverticulitis, status post partial colectomy with colostomy placement may 05/18/2014, status post colostomy takedown 09/15/2014  PLAN: Kimerly is now 2-1/2 years out from definitive surgery for her breast cancer with no evidence of disease recurrence. This is very favorable.  She is tolerating the tamoxifen better we would like to consider switching to an aromatase inhibitor for her last 2 years and without and We will obtain a bone density before her return visit here which will be in April.  We went over the strange feeling she has on the back of her neck, which I think is going to be postherpetic neuralgia. This is not intense or significant enough for [place] to treat with gabapentin or any other intervention but she may want a massage that, or use ice or hot pads if it bothers her on a particular day.  She has been feeling fatigued and more tired than she feels she should be. We're going to add some thyroid studies to her next set of labs.  Even though she looks very healthy I am concerned about this persistent cough and I'm going to obtain a chest x-ray today just to make sure that she is not developing any lung problems from her breast cancer.  She is concerned that she had a thyroid ultrasound couple of years ago which showed some nodules, and then because of the breast cancer problem that was laid side. She will like to repeat that. I have gone ahead and enter the order for her to have that test at [facility]. She will follow-up with Dr. Harlow Asa depending on results  Otherwise she will see me again late April of next year. At that time we will decide whether or not to switch to anastrozole depending on the bone density results and I will start seeing her on a once a year basis.   Chauncey Cruel, MD  08/06/2016 12:33 PM

## 2016-08-20 ENCOUNTER — Encounter: Payer: Self-pay | Admitting: *Deleted

## 2016-08-20 ENCOUNTER — Encounter: Payer: Self-pay | Admitting: General Surgery

## 2016-08-27 ENCOUNTER — Telehealth: Payer: Self-pay | Admitting: Family Medicine

## 2016-08-27 NOTE — Telephone Encounter (Signed)
Called Pt to schedule AWV with [facility] - knb °

## 2016-09-25 ENCOUNTER — Other Ambulatory Visit: Payer: Self-pay | Admitting: Nurse Practitioner

## 2016-09-30 ENCOUNTER — Telehealth: Payer: Self-pay | Admitting: Family Medicine

## 2016-09-30 NOTE — Telephone Encounter (Signed)
Called Pt to schedule AWV with [facility] - knb °

## 2016-10-20 ENCOUNTER — Ambulatory Visit: Payer: Self-pay | Admitting: General Surgery

## 2016-11-28 ENCOUNTER — Telehealth: Payer: Self-pay | Admitting: Oncology

## 2016-11-28 NOTE — Telephone Encounter (Signed)
4/24 Needs to reschedule for the week before or the week after.  She will be out of town for the 4/24 appointment  Labs and Dr appointment.  Please call and verify appointment  [phone]

## 2016-12-02 ENCOUNTER — Telehealth: Payer: Self-pay | Admitting: Oncology

## 2016-12-02 NOTE — Telephone Encounter (Signed)
lvm to inform pt of 4/30 appt at 2 pm per telephone encounter

## 2016-12-03 ENCOUNTER — Other Ambulatory Visit: Payer: Self-pay | Admitting: Oncology

## 2016-12-03 DIAGNOSIS — Z853 Personal history of malignant neoplasm of breast: Secondary | ICD-10-CM

## 2016-12-17 ENCOUNTER — Ambulatory Visit (INDEPENDENT_AMBULATORY_CARE_PROVIDER_SITE_OTHER): Payer: PPO | Admitting: Physician Assistant

## 2016-12-17 ENCOUNTER — Encounter: Payer: Self-pay | Admitting: Physician Assistant

## 2016-12-17 VITALS — BP 110/78 | HR 88 | Temp 98.2°F | Resp 16 | Ht 66.0 in | Wt 202.8 lb

## 2016-12-17 DIAGNOSIS — R300 Dysuria: Secondary | ICD-10-CM

## 2016-12-17 DIAGNOSIS — J0140 Acute pansinusitis, unspecified: Secondary | ICD-10-CM

## 2016-12-17 LAB — POCT URINALYSIS DIPSTICK
Bilirubin, UA: NEGATIVE
Glucose, UA: NEGATIVE
Ketones, UA: NEGATIVE
Leukocytes, UA: NEGATIVE
Nitrite, UA: NEGATIVE
PH UA: 6 (ref 5.0–8.0)
PROTEIN UA: NEGATIVE
RBC UA: NEGATIVE
SPEC GRAV UA: 1.02 (ref 1.030–1.035)
UROBILINOGEN UA: 0.2 (ref ?–2.0)

## 2016-12-17 MED ORDER — AMOXICILLIN-POT CLAVULANATE 875-125 MG PO TABS: 1.0000 | ORAL_TABLET | Freq: Two times a day (BID) | ORAL | 0 refills | Status: DC

## 2016-12-17 NOTE — Patient Instructions (Signed)

## 2016-12-17 NOTE — Progress Notes (Signed)
Patient: Martha Evans Female    DOB: 26-Nov-1945   71 y.o.   MRN: [id number] Visit Date: 12/17/2016  Today's Provider: Mar Daring, PA-C   Chief Complaint  Patient presents with  . URI  . Urinary Tract Infection   Subjective:    HPI Upper Respiratory Infection: Patient complains of symptoms of a URI. Symptoms include congestion and cough. Onset of symptoms was 3 days ago, gradually worsening since that time. She also c/o nasal congestion, productive cough with  white colored sputum and voice change for the past 1 days .  She is drinking plenty of fluids. Evaluation to date: none. Treatment to date: antibiotics. Patient reports she took Amoxicillin 2 doses.  Urinary Tract Infection: Patient complains of abnormal smelling urine She has had symptoms for 1 month. Patient also complains of back pain. Patient denies fever. Patient does have a history of recurrent UTI.  Patient does not have a history of pyelonephritis. Patient reports that since taking tamoxifen. She has been having recurrent UTI's.      Allergies  Allergen Reactions  . Codeine Sulfate Nausea And Vomiting  . Tramadol Nausea Only    Nausea      Current Outpatient Prescriptions:  .  ibuprofen (ADVIL,MOTRIN) 800 MG tablet, Take 1 tablet (800 mg total) by mouth every 8 (eight) hours as needed., Disp: 30 tablet, Rfl: 0 .  loratadine (CLARITIN) 10 MG tablet, Take 1 tablet by mouth daily., Disp: , Rfl:  .  omeprazole (PRILOSEC) 20 MG capsule, Take 20 mg by mouth 2 (two) times daily before a meal. , Disp: , Rfl:  .  tamoxifen (NOLVADEX) 20 MG tablet, Take 1 tablet (20 mg total) by mouth daily., Disp: 90 tablet, Rfl: 3 .  telmisartan (MICARDIS) 40 MG tablet, Take 40 mg by mouth at bedtime. , Disp: , Rfl:   Review of Systems  Constitutional: Negative.   HENT: Positive for congestion, postnasal drip, rhinorrhea, sinus pain, sinus pressure and voice change. Negative for ear pain, sneezing and tinnitus.     Respiratory: Positive for cough.   Cardiovascular: Negative.   Gastrointestinal: Negative.   Genitourinary: Positive for difficulty urinating and dysuria. Negative for decreased urine volume, frequency, hematuria, pelvic pain, urgency and vaginal pain.  Neurological: Positive for headaches. Negative for dizziness.    Social History  Substance Use Topics  . Smoking status: Never Smoker  . Smokeless tobacco: Never Used  . Alcohol use Yes     Comment: seldom   Objective:   BP 110/78 (BP Location: Left Arm, Patient Position: Sitting, Cuff Size: Large)   Pulse 88   Temp 98.2 F (36.8 C) (Oral)   Resp 16   Ht 5\' 6"  (1.676 m)   Wt 202 lb 12.8 oz (92 kg)   SpO2 97%   BMI 32.73 kg/m  Vitals:   12/17/16 1107  BP: 110/78  Pulse: 88  Resp: 16  Temp: 98.2 F (36.8 C)  TempSrc: Oral  SpO2: 97%  Weight: 202 lb 12.8 oz (92 kg)  Height: 5\' 6"  (1.676 m)     Physical Exam  Constitutional: She appears well-developed and well-nourished. No distress.  HENT:  Head: Normocephalic and atraumatic.  Right Ear: Hearing, tympanic membrane, external ear and ear canal normal.  Left Ear: Hearing, tympanic membrane, external ear and ear canal normal.  Nose: Right sinus exhibits maxillary sinus tenderness and frontal sinus tenderness. Left sinus exhibits maxillary sinus tenderness and frontal sinus tenderness.  Mouth/Throat: Uvula  is midline, oropharynx is clear and moist and mucous membranes are normal. No oropharyngeal exudate.  Neck: Normal range of motion. Neck supple. No tracheal deviation present. No thyromegaly present.  Cardiovascular: Normal rate, regular rhythm and normal heart sounds.  Exam reveals no gallop and no friction rub.   No murmur heard. Pulmonary/Chest: Effort normal and breath sounds normal. No stridor. No respiratory distress. She has no wheezes. She has no rales.  Abdominal: Soft. Bowel sounds are normal. She exhibits no distension and no mass. There is no tenderness.  There is no rebound, no guarding and no CVA tenderness.  Lymphadenopathy:    She has no cervical adenopathy.  Skin: She is not diaphoretic.  Vitals reviewed.     Assessment & Plan:     1. Acute non-recurrent pansinusitis Worsening symptoms that have not responded to OTC medications. Will give augmentin as below. Continue allergy medications. Stay well hydrated and get plenty of rest. Call if no symptom improvement or if symptoms worsen. - amoxicillin-clavulanate (AUGMENTIN) 875-125 MG tablet; Take 1 tablet by mouth 2 (two) times daily.  Dispense: 20 tablet; Refill: 0  2. Dysuria UA was normal except specific gravity high at 1.030 indicating some concentration and dehydration which may worsen the smell and cause burning. Patient advised to push fluids and call if symptoms fail to improve or worsen. - POCT urinalysis dipstick       Mar Daring, PA-C  [facility]

## 2016-12-18 ENCOUNTER — Telehealth: Payer: Self-pay | Admitting: Family Medicine

## 2016-12-18 DIAGNOSIS — J0140 Acute pansinusitis, unspecified: Secondary | ICD-10-CM

## 2016-12-18 MED ORDER — AMOXICILLIN 875 MG PO TABS: 875.0000 mg | ORAL_TABLET | Freq: Two times a day (BID) | ORAL | 0 refills | Status: DC

## 2016-12-18 NOTE — Telephone Encounter (Signed)
Sent in Amoxil 8[address] to [address]

## 2016-12-18 NOTE — Telephone Encounter (Signed)
Patient advised as below.  

## 2016-12-18 NOTE — Telephone Encounter (Signed)
Pt was in yesterday with a sinus throat infection.  The antibiotic prescribed hurts her stomach.  She has had stomach surgery.  She wants to know if you will change to regular amoxil.  She uses [facility].  Thanks  [employer]

## 2016-12-18 NOTE — Telephone Encounter (Signed)
Please review

## 2016-12-25 ENCOUNTER — Encounter: Payer: Self-pay | Admitting: Oncology

## 2016-12-25 ENCOUNTER — Other Ambulatory Visit: Payer: Self-pay | Admitting: Physician Assistant

## 2016-12-25 DIAGNOSIS — R519 Headache, unspecified: Secondary | ICD-10-CM

## 2016-12-25 DIAGNOSIS — R51 Headache: Principal | ICD-10-CM

## 2016-12-26 MED ORDER — IBUPROFEN 800 MG PO TABS: 800.0000 mg | ORAL_TABLET | Freq: Three times a day (TID) | ORAL | 0 refills | Status: DC | PRN

## 2016-12-26 NOTE — Telephone Encounter (Signed)
Refill IBU 800mg  sent to [facility]

## 2016-12-31 ENCOUNTER — Telehealth: Payer: Self-pay

## 2016-12-31 NOTE — Telephone Encounter (Signed)
Left message to schedule AWV, anr

## 2017-01-14 ENCOUNTER — Ambulatory Visit
Admission: RE | Admit: 2017-01-14 | Discharge: 2017-01-14 | Disposition: A | Discharge status: Home / Self Care | Payer: PPO | Source: Ambulatory Visit | Attending: Oncology | Admitting: Oncology

## 2017-01-14 ENCOUNTER — Other Ambulatory Visit: Payer: Self-pay | Admitting: Oncology

## 2017-01-14 DIAGNOSIS — M85851 Other specified disorders of bone density and structure, right thigh: Secondary | ICD-10-CM | POA: Diagnosis not present

## 2017-01-14 DIAGNOSIS — R928 Other abnormal and inconclusive findings on diagnostic imaging of breast: Secondary | ICD-10-CM | POA: Diagnosis not present

## 2017-01-14 DIAGNOSIS — Z853 Personal history of malignant neoplasm of breast: Secondary | ICD-10-CM

## 2017-01-14 DIAGNOSIS — Z170 Estrogen receptor positive status [ER+]: Principal | ICD-10-CM

## 2017-01-14 DIAGNOSIS — Z780 Asymptomatic menopausal state: Secondary | ICD-10-CM | POA: Diagnosis not present

## 2017-01-14 DIAGNOSIS — C50511 Malignant neoplasm of lower-outer quadrant of right female breast: Secondary | ICD-10-CM

## 2017-01-14 HISTORY — DX: Personal history of irradiation: Z92.3

## 2017-01-14 HISTORY — DX: Personal history of antineoplastic chemotherapy: Z92.21

## 2017-01-20 ENCOUNTER — Ambulatory Visit: Payer: Self-pay | Admitting: Oncology

## 2017-01-20 ENCOUNTER — Other Ambulatory Visit: Payer: Self-pay

## 2017-01-26 ENCOUNTER — Ambulatory Visit (HOSPITAL_BASED_OUTPATIENT_CLINIC_OR_DEPARTMENT_OTHER): Payer: PPO | Admitting: Oncology

## 2017-01-26 ENCOUNTER — Ambulatory Visit
Admission: RE | Admit: 2017-01-26 | Discharge: 2017-01-26 | Disposition: A | Discharge status: Home / Self Care | Payer: PPO | Source: Ambulatory Visit | Attending: Oncology | Admitting: Oncology

## 2017-01-26 ENCOUNTER — Other Ambulatory Visit (HOSPITAL_BASED_OUTPATIENT_CLINIC_OR_DEPARTMENT_OTHER): Payer: PPO

## 2017-01-26 ENCOUNTER — Other Ambulatory Visit: Payer: Self-pay | Admitting: Oncology

## 2017-01-26 VITALS — BP 139/67 | HR 85 | Temp 98.0°F | Resp 18 | Ht 66.0 in | Wt 203.8 lb

## 2017-01-26 DIAGNOSIS — N951 Menopausal and female climacteric states: Secondary | ICD-10-CM

## 2017-01-26 DIAGNOSIS — I10 Essential (primary) hypertension: Secondary | ICD-10-CM

## 2017-01-26 DIAGNOSIS — C50511 Malignant neoplasm of lower-outer quadrant of right female breast: Secondary | ICD-10-CM

## 2017-01-26 DIAGNOSIS — Z170 Estrogen receptor positive status [ER+]: Secondary | ICD-10-CM

## 2017-01-26 DIAGNOSIS — M8580 Other specified disorders of bone density and structure, unspecified site: Secondary | ICD-10-CM | POA: Diagnosis not present

## 2017-01-26 DIAGNOSIS — N898 Other specified noninflammatory disorders of vagina: Secondary | ICD-10-CM

## 2017-01-26 DIAGNOSIS — N641 Fat necrosis of breast: Secondary | ICD-10-CM | POA: Diagnosis not present

## 2017-01-26 DIAGNOSIS — Z853 Personal history of malignant neoplasm of breast: Secondary | ICD-10-CM

## 2017-01-26 DIAGNOSIS — Z79810 Long term (current) use of selective estrogen receptor modulators (SERMs): Secondary | ICD-10-CM | POA: Diagnosis not present

## 2017-01-26 DIAGNOSIS — Z09 Encounter for follow-up examination after completed treatment for conditions other than malignant neoplasm: Secondary | ICD-10-CM | POA: Diagnosis not present

## 2017-01-26 DIAGNOSIS — E041 Nontoxic single thyroid nodule: Secondary | ICD-10-CM | POA: Diagnosis not present

## 2017-01-26 DIAGNOSIS — M542 Cervicalgia: Secondary | ICD-10-CM | POA: Diagnosis not present

## 2017-01-26 DIAGNOSIS — R921 Mammographic calcification found on diagnostic imaging of breast: Secondary | ICD-10-CM

## 2017-01-26 LAB — CBC WITH DIFFERENTIAL/PLATELET
BASO%: 0.5 % (ref 0.0–2.0)
BASOS ABS: 0 10*3/uL (ref 0.0–0.1)
EOS ABS: 0.1 10*3/uL (ref 0.0–0.5)
EOS%: 2.1 % (ref 0.0–7.0)
HCT: 36 % (ref 34.8–46.6)
HGB: 11.8 g/dL (ref 11.6–15.9)
LYMPH%: 23.6 % (ref 14.0–49.7)
MCH: 31.3 pg (ref 25.1–34.0)
MCHC: 32.9 g/dL (ref 31.5–36.0)
MCV: 94.9 fL (ref 79.5–101.0)
MONO#: 0.5 10*3/uL (ref 0.1–0.9)
MONO%: 9 % (ref 0.0–14.0)
NEUT%: 64.8 % (ref 38.4–76.8)
NEUTROS ABS: 3.9 10*3/uL (ref 1.5–6.5)
PLATELETS: 143 10*3/uL — AB (ref 145–400)
RBC: 3.79 10*6/uL (ref 3.70–5.45)
RDW: 13.3 % (ref 11.2–14.5)
WBC: 6 10*3/uL (ref 3.9–10.3)
lymph#: 1.4 10*3/uL (ref 0.9–3.3)

## 2017-01-26 LAB — TSH: TSH: 1.469 m[IU]/L (ref 0.308–3.960)

## 2017-01-26 LAB — COMPREHENSIVE METABOLIC PANEL
ALK PHOS: 102 U/L (ref 40–150)
ALT: 28 U/L (ref 0–55)
ANION GAP: 10 meq/L (ref 3–11)
AST: 19 U/L (ref 5–34)
Albumin: 3.5 g/dL (ref 3.5–5.0)
BILIRUBIN TOTAL: 0.28 mg/dL (ref 0.20–1.20)
BUN: 26.1 mg/dL — ABNORMAL HIGH (ref 7.0–26.0)
CO2: 24 meq/L (ref 22–29)
Calcium: 9 mg/dL (ref 8.4–10.4)
Chloride: 109 mEq/L (ref 98–109)
Creatinine: 1.1 mg/dL (ref 0.6–1.1)
EGFR: 49 mL/min/{1.73_m2} — ABNORMAL LOW (ref >90)
GLUCOSE: 153 mg/dL — AB (ref 70–140)
POTASSIUM: 4 meq/L (ref 3.5–5.1)
SODIUM: 143 meq/L (ref 136–145)
TOTAL PROTEIN: 6.5 g/dL (ref 6.4–8.3)

## 2017-01-26 NOTE — Progress Notes (Signed)
Martha Evans  [phone] [phone]     ID: Martha Evans DOB: Apr 23, 1946  MR#: [id number]  DPO#:[id number]  Patient Care Team: Birdie Sons, MD as PCP - General (Family Medicine) Teodoro Spray, MD as Consulting Physician (Cardiology) Armandina Gemma, MD as Consulting Physician (General Surgery) Thea Silversmith, MD (Inactive) as Consulting Physician (Radiation Oncology) Chauncey Cruel, MD as Consulting Physician (Oncology) Noreene Filbert, MD as Consulting Physician Rod Can, MD as Consulting Physician (Orthopedic Surgery)  CHIEF COMPLAINT: Metaplastic breast cancer  CURRENT TREATMENT:  Tamoxifen  BREAST CANCER HISTORY: From a Dr. Laurelyn Sickle initial intake note 12/21/2013:  "Martha Evans is a 71 y.o. female. Would medical history significant for hypertension gastroesophageal reflux disease. Patient underwent bilateral breast reductions in the 1980s. In February 2015 she had screening mammograms performed that showed a mass in the central right breast. She had ultrasound performed which revealed a 6 x 4 x 6 mm high-grade calcifications. Biopsy of these calcifications revealed invasive ductal carcinoma. On 12/12/2013 patient underwent a lumpectomy without sentinel lymph node biopsy. 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 Tumor was ER +2% PR +2% HER-2/neu negative with an elevated Ki-67 of 84%. Of note patient [could not] undergo a sentinel lymph node biopsy because of previous breast reduction."  The patient was started on adjuvant chemotherapy with cyclophosphamide and docetaxel 01/16/2014, but treatment was interrupted by a perforated diverticulum requiring partial colectomy 02/13/2014.   Her subsequent history is as detailed below  INTERVAL HISTORY: Laporchia returns today for follow-up of her breast cancer which was very minimally estrogen and progesterone receptor positive. She continues on tamoxifen.  She does have problems with hot flashes and vaginal wetness which are more of a nuisance than anything else.  She just had mammography which showed an area of calcification in the right breast not far from her prior biopsy. She had a biopsy of that area earlier today. Results should be out tomorrow. She is appropriately concerned, but instead of writing at home she decided before the biopsy she would spend a week at the beach and she enjoyed that a great deal.  REVIEW OF SYSTEMS: Keosha is concerned about weight gain. She says "I hope its fluid". She has a good diet. She does not exercise regularly but does do some walking most days she says. She also has pain in the left shoulder and neck area. This is where she had shingles previously but this pain is not burning its more like an ache she says. It is not constant. Aside from that a detailed review of systems today was stable  PAST MEDICAL HISTORY: Past Medical History:  Diagnosis Date  . Allergy   . Cancer [facility])    breast cancer right breast  . Complication of anesthesia    was hard to intubate with thyroid surgery-99  . Difficult intubation 1999   when had thyroid lobectomy  . Dysrhythmia    irregular reuglar heart beat   . GERD (gastroesophageal reflux disease)   . History of chicken pox   . History of measles   . Hypertension   . Personal history of chemotherapy   . Personal history of radiation therapy   . Wears hearing aid    right    PAST SURGICAL HISTORY: Past Surgical History:  Procedure Laterality Date  . ABDOMINAL HYSTERECTOMY  1999  . APPENDECTOMY     as child  . BREAST REDUCTION SURGERY Bilateral    1980's  .  BREAST SURGERY Right 12/01/2013   ultrasound guided  . COLON RESECTION N/A 09/15/2014   Procedure: Laparoscopic hand assisted colostomy reversal with resection of left colon and laparoscopic lysis of adhesions ;  Surgeon: Alphonsa Overall, MD;  Location: [facility];  Service: General;  Laterality: N/A;  .  COLONOSCOPY    . COLONOSCOPY N/A 08/22/2014   Procedure: COLONOSCOPY;  Surgeon: Alphonsa Overall, MD;  Location: [facility];  Service: General;  Laterality: N/A;  . LAPAROTOMY N/A 02/11/2014   Procedure: EXPLORATORY LAPAROTOMY, COLON RESECTION, COLOSTOMY;  Surgeon: Shann Medal, MD;  Location: [facility];  Service: General;  Laterality: N/A;  . PARTIAL MASTECTOMY WITH NEEDLE LOCALIZATION AND AXILLARY SENTINEL LYMPH NODE BX Right 12/12/2013   Procedure: PARTIAL MASTECTOMY WITH NEEDLE LOCALIZATION AND AXILLARY SENTINEL LYMPH NODE BX;  Surgeon: Earnstine Regal, MD;  Location: [facility];  Service: General;  Laterality: Right;  . PORT-A-CATH REMOVAL Left 04/20/2015   Procedure: REMOVAL PORT-A-CATH;  Surgeon: Armandina Gemma, MD;  Location: [facility];  Service: General;  Laterality: Left;  . PORTACATH PLACEMENT Left 01/02/2014   Procedure: INSERTION PORT-A-CATH;  Surgeon: Earnstine Regal, MD;  Location: [facility];  Service: General;  Laterality: Left;  . ROTATOR CUFF REPAIR  2006   right shoulder  . THYROID LOBECTOMY  1999   right  . TONSILLECTOMY     as child    FAMILY HISTORY Family History  Problem Relation Age of Onset  . Cancer Father     bladder & pancreatic  . Alzheimer's disease Mother   . Diabetes Brother   The patient's father died from pancreatic cancer the age of 48. The patient's mother is alive, age 56, with significant Alzheimer's disease. The patient had one brother, no sisters. There are no first-degree relatives with breast or ovarian cancer.  GYNECOLOGIC HISTORY:  No LMP recorded. Patient has had a hysterectomy. Menarche age 36. The patient is GX P0. She underwent hysterectomy with bilateral salpingo-oophorectomy in 1999. She used an estrogen patch for several years after that.   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 Her husband, Lendon Collar, works as a Armed forces logistics/support/administrative officer. He has 3 children from a prior marriage,  living in [place], [place], and both work. The patient attends a local [employer]    ADVANCED DIRECTIVES: Not in place   HEALTH MAINTENANCE: Social History  Substance Use Topics  . Smoking status: Never Smoker  . Smokeless tobacco: Never Used  . Alcohol use Yes     Comment: seldom     Colonoscopy:  PAP: Status post hysterectomy  Bone density:  Lipid panel:  Allergies  Allergen Reactions  . Codeine Sulfate Nausea And Vomiting  . Tramadol Nausea Only    Nausea     Current Outpatient Prescriptions  Medication Sig Dispense Refill  . amoxicillin (AMOXIL) 875 MG tablet Take 1 tablet (875 mg total) by mouth 2 (two) times daily. 20 tablet 0  . ibuprofen (ADVIL,MOTRIN) 800 MG tablet Take 1 tablet (800 mg total) by mouth every 8 (eight) hours as needed. 90 tablet 0  . loratadine (CLARITIN) 10 MG tablet Take 1 tablet by mouth daily.    Marland Kitchen omeprazole (PRILOSEC) 20 MG capsule Take 20 mg by mouth 2 (two) times daily before a meal.     . tamoxifen (NOLVADEX) 20 MG tablet Take 1 tablet (20 mg total) by mouth daily. 90 tablet 3  . telmisartan (MICARDIS) 40 MG  tablet Take 40 mg by mouth at bedtime.      No current facility-administered medications for this visit.     OBJECTIVE: Middle-aged white woman In no acute distress   Vitals:   01/26/17 1416  BP: 139/67  Pulse: 85  Resp: 18  Temp: 98 F (36.7 C)     Body mass index is 32.89 kg/m.    ECOG FS:1 - Symptomatic but completely ambulatory  Sclerae unicteric, pupils round and equal Oropharynx clear and moist No cervical or supraclavicular adenopathy Lungs no rales or rhonchi Heart regular rate and rhythm Abd soft, nontender, positive bowel sounds MSK no focal spinal tenderness, no upper extremity lymphedema, no left shoulder swelling Neuro: nonfocal, well oriented, appropriate affect Breasts: The right breast is status post lumpectomy and radiation. It is status post biopsy earlier today with no swelling or  ecchymosis. The left breast is unremarkable. Both axillae are benign.  LAB RESULTS:  CMP     Component Value Date/Time   NA 143 01/26/2017 1351   K 4.0 01/26/2017 1351   CL 102 09/18/2014 0440   CO2 24 01/26/2017 1351   GLUCOSE 153 (H) 01/26/2017 1351   BUN 26.1 (H) 01/26/2017 1351   CREATININE 1.1 01/26/2017 1351   CALCIUM 9.0 01/26/2017 1351   PROT 6.5 01/26/2017 1351   ALBUMIN 3.5 01/26/2017 1351   AST 19 01/26/2017 1351   ALT 28 01/26/2017 1351   ALKPHOS 102 01/26/2017 1351   BILITOT 0.28 01/26/2017 1351   GFRNONAA 67 (L) 09/18/2014 0440   GFRAA 78 (L) 09/18/2014 0440    I No results found for: SPEP  Lab Results  Component Value Date   WBC 6.0 01/26/2017   NEUTROABS 3.9 01/26/2017   HGB 11.8 01/26/2017   HCT 36.0 01/26/2017   MCV 94.9 01/26/2017   PLT 143 (L) 01/26/2017      Chemistry      Component Value Date/Time   NA 143 01/26/2017 1351   K 4.0 01/26/2017 1351   CL 102 09/18/2014 0440   CO2 24 01/26/2017 1351   BUN 26.1 (H) 01/26/2017 1351   CREATININE 1.1 01/26/2017 1351   GLU 94 12/31/2012      Component Value Date/Time   CALCIUM 9.0 01/26/2017 1351   ALKPHOS 102 01/26/2017 1351   AST 19 01/26/2017 1351   ALT 28 01/26/2017 1351   BILITOT 0.28 01/26/2017 1351       No results found for: LABCA2  No components found for: LABCA125  No results for input(s): INR in the last 168 hours.  Urinalysis    Component Value Date/Time   COLORURINE YELLOW 02/17/2014 1639   APPEARANCEUR CLOUDY (A) 02/17/2014 1639   LABSPEC 1.016 02/17/2014 1639   PHURINE 5.0 02/17/2014 1639   GLUCOSEU NEGATIVE 02/17/2014 1639   HGBUR NEGATIVE 02/17/2014 1639   BILIRUBINUR negative 12/17/2016 1117   KETONESUR NEGATIVE 02/17/2014 1639   PROTEINUR negative 12/17/2016 1117   PROTEINUR 30 (A) 02/17/2014 1639   UROBILINOGEN 0.2 12/17/2016 1117   UROBILINOGEN 1.0 02/17/2014 1639   NITRITE negative 12/17/2016 1117   NITRITE NEGATIVE 02/17/2014 1639   LEUKOCYTESUR  Negative 12/17/2016 1117    STUDIES: Mammography 01/14/2017 found a breast density to be category A. There were some pleomorphic calcifications in the upper outer quadrant of the right breast, near the area of the prior lumpectomy, measuring up to 3.9 cm. Biopsy of this area was performed 01/26/2017.  ASSESSMENT: 71 y.o. [place], [place] woman  (1) status post right breast Lower  outer quadrant biopsy 12/01/2013 for an invasive ductal carcinoma, grade 2 or 3, estrogen receptor 2% "positive", progesterone receptor 2% "positive", both with moderate staining intensity, with an MIB-1 of 84%, and no HER-2 amplification  (2) status post right lumpectomy 12/12/2013 ([id number]) for a pT1b pNX invasive ductal carcinoma, metaplastic, high-grade, estrogen and progesterone receptor negative, repeat HER-2 again negative. Because of prior reduction mammoplasty sentinel lymph node sampling was not performed  (3) adjuvant chemotherapy with cyclophosphamide and docetaxel started 76/22/6333, complicated by peripheral neuropathy, and interrupted after 2 cycles because of diverticular perforation requiring partial colectomy (02/13/2014)  (4) completed 2 additional cycles of chemotherapy 05/22/2014, consisting of carboplatin given day 1 and gemcitabine days 1 and 8 of each 21 day cycle, with Neulasta on day 9.  (5) adjuvant radiation under Dr. Berton Mount followed chemotherapy  (6) tamoxifen started January 2016 stopped 2 weeks into drug;  restarted in February 2016  (a) bone density 01/14/2017 finds a T score of -1.4  (7) diverticulitis, status post partial colectomy with colostomy placement may 05/18/2014, status post colostomy takedown 09/15/2014  PLAN: Delinda is understandably anxious to hear about her biopsy results. We will call her tomorrow after we get those. Of course she also will be called by the breast Center.  She is tolerating tamoxifen with some hot flashes and vaginal wetness. For now we are  continuing this. Ideally we would switch to an aromatase inhibitor after another year or so of tamoxifen and we will discuss that at the next visit here which will be in February of next year.  Her bone density shows minimal osteopenia  Of course if she does have recurrent disease she would likely need a mastectomy. In that case I would see her after the definitive surgery  She knows to call for any problems that may develop before the next visit.   Chauncey Cruel, MD  01/26/2017 2:32 PM

## 2017-01-27 LAB — T3 UPTAKE
FREE THYROXINE INDEX: 1.5 (ref 1.2–4.9)
T3 UPTAKE RATIO: 22 % — AB (ref 24–39)

## 2017-01-27 LAB — T4: T4 TOTAL: 6.8 ug/dL (ref 4.5–12.0)

## 2017-01-27 LAB — T4, FREE: T4,Free(Direct): 0.97 ng/dL (ref 0.82–1.77)

## 2017-01-28 ENCOUNTER — Other Ambulatory Visit: Payer: Self-pay | Admitting: Surgery

## 2017-01-28 DIAGNOSIS — E041 Nontoxic single thyroid nodule: Secondary | ICD-10-CM

## 2017-02-03 ENCOUNTER — Ambulatory Visit
Admission: RE | Admit: 2017-02-03 | Discharge: 2017-02-03 | Disposition: A | Discharge status: Home / Self Care | Payer: PPO | Source: Ambulatory Visit | Attending: Surgery | Admitting: Surgery

## 2017-02-03 ENCOUNTER — Other Ambulatory Visit: Payer: Self-pay

## 2017-02-03 DIAGNOSIS — E042 Nontoxic multinodular goiter: Secondary | ICD-10-CM | POA: Diagnosis not present

## 2017-02-03 DIAGNOSIS — E041 Nontoxic single thyroid nodule: Secondary | ICD-10-CM

## 2017-02-03 MED ORDER — TAMOXIFEN CITRATE 20 MG PO TABS: 20.0000 mg | ORAL_TABLET | Freq: Every day | ORAL | 3 refills | Status: DC

## 2017-02-04 ENCOUNTER — Encounter: Payer: Self-pay | Admitting: Oncology

## 2017-02-04 ENCOUNTER — Telehealth: Payer: Self-pay | Admitting: *Deleted

## 2017-02-04 NOTE — Telephone Encounter (Signed)
"  I need a refill for Tamoxifen." Returned call to notify her refill was sent to [facility] and received at 1:59 pm.  No further questions.

## 2017-02-19 ENCOUNTER — Telehealth: Payer: Self-pay | Admitting: Family Medicine

## 2017-03-02 NOTE — Telephone Encounter (Signed)
appt scheduled for 03/19/17 at 2:30

## 2017-03-19 ENCOUNTER — Ambulatory Visit (INDEPENDENT_AMBULATORY_CARE_PROVIDER_SITE_OTHER): Payer: PPO

## 2017-03-19 VITALS — BP 128/72 | HR 88 | Temp 99.2°F | Ht 66.0 in | Wt 202.8 lb

## 2017-03-19 DIAGNOSIS — Z0000 Encounter for general adult medical examination without abnormal findings: Secondary | ICD-10-CM | POA: Diagnosis not present

## 2017-03-19 NOTE — Patient Instructions (Signed)
Martha Evans , Thank you for taking time to come for your Medicare Wellness Visit. I appreciate your ongoing commitment to your health goals. Please review the following plan we discussed and let me know if I can assist you in the future.   Screening recommendations/referrals: Colonoscopy: completed 08/22/14, due 07/2024 Mammogram: completed 01/27/17, due 01/2018 Bone Density: completed 01/14/17 Recommended yearly ophthalmology/optometry visit for glaucoma screening and checkup Recommended yearly dental visit for hygiene and checkup  Vaccinations: Influenza vaccine: due 05/2017 Pneumococcal vaccine: Pneumovax 23 given 06/30/13, denied Prevnar 13 today. Tdap vaccine: completed 12/31/12, due 12/2022 Shingles vaccine: completed 12/31/12  Advanced directives: Please bring a copy of your POA (Power of [place]) and/or Living Will to your next appointment.   Conditions/risks identified: Recommend decreasing sugar and carbohydrate intake. Pt to start cutting out breads and sweets.   Next appointment: None, need to schedule 1 year AWV.   Preventive Care 72 Years and Older, Female Preventive care refers to lifestyle choices and visits with your health care provider that can promote health and wellness. What does preventive care include?  A yearly physical exam. This is also called an annual well check.  Dental exams once or twice a year.  Routine eye exams. Ask your health care provider how often you should have your eyes checked.  Personal lifestyle choices, including:  Daily care of your teeth and gums.  Regular physical activity.  Eating a healthy diet.  Avoiding tobacco and drug use.  Limiting alcohol use.  Practicing safe sex.  Taking low-dose aspirin every day.  Taking vitamin and mineral supplements as recommended by your health care provider. What happens during an annual well check? The services and screenings done by your health care provider during your annual well  check will depend on your age, overall health, lifestyle risk factors, and family history of disease. Counseling  Your health care provider may ask you questions about your:  Alcohol use.  Tobacco use.  Drug use.  Emotional well-being.  Home and relationship well-being.  Sexual activity.  Eating habits.  History of falls.  Memory and ability to understand (cognition).  Work and work Statistician.  Reproductive health. Screening  You may have the following tests or measurements:  Height, weight, and BMI.  Blood pressure.  Lipid and cholesterol levels. These may be checked every 5 years, or more frequently if you are over 46 years old.  Skin check.  Lung cancer screening. You may have this screening every year starting at age 56 if you have a 30-pack-year history of smoking and currently smoke or have quit within the past 15 years.  Fecal occult blood test (FOBT) of the stool. You may have this test every year starting at age 71.  Flexible sigmoidoscopy or colonoscopy. You may have a sigmoidoscopy every 5 years or a colonoscopy every 10 years starting at age 70.  Hepatitis C blood test.  Hepatitis B blood test.  Sexually transmitted disease (STD) testing.  Diabetes screening. This is done by checking your blood sugar (glucose) after you have not eaten for a while (fasting). You may have this done every 1-3 years.  Bone density scan. This is done to screen for osteoporosis. You may have this done starting at age 16.  Mammogram. This may be done every 1-2 years. Talk to your health care provider about how often you should have regular mammograms. Talk with your health care provider about your test results, treatment options, and if necessary, the need for more tests. Vaccines  Your health care provider may recommend certain vaccines, such as:  Influenza vaccine. This is recommended every year.  Tetanus, diphtheria, and acellular pertussis (Tdap, Td) vaccine. You  may need a Td booster every 10 years.  Zoster vaccine. You may need this after age 71.  Pneumococcal 13-valent conjugate (PCV13) vaccine. One dose is recommended after age 69.  Pneumococcal polysaccharide (PPSV23) vaccine. One dose is recommended after age 29. Talk to your health care provider about which screenings and vaccines you need and how often you need them. This information is not intended to replace advice given to you by your health care provider. Make sure you discuss any questions you have with your health care provider. Document Released: 10/12/2015 Document Revised: 06/04/2016 Document Reviewed: 07/17/2015 Elsevier Interactive Patient Education  2017 [employer] Prevention in the Home Falls can cause injuries. They can happen to people of all ages. There are many things you can do to make your home safe and to help prevent falls. What can I do on the outside of my home?  Regularly fix the edges of walkways and driveways and fix any cracks.  Remove anything that might make you trip as you walk through a door, such as a raised step or threshold.  Trim any bushes or trees on the path to your home.  Use bright outdoor lighting.  Clear any walking paths of anything that might make someone trip, such as rocks or tools.  Regularly check to see if handrails are loose or broken. Make sure that both sides of any steps have handrails.  Any raised decks and porches should have guardrails on the edges.  Have any leaves, snow, or ice cleared regularly.  Use sand or salt on walking paths during winter.  Clean up any spills in your garage right away. This includes oil or grease spills. What can I do in the bathroom?  Use night lights.  Install grab bars by the toilet and in the tub and shower. Do not use towel bars as grab bars.  Use non-skid mats or decals in the tub or shower.  If you need to sit down in the shower, use a plastic, non-slip stool.  Keep the floor  dry. Clean up any water that spills on the floor as soon as it happens.  Remove soap buildup in the tub or shower regularly.  Attach bath mats securely with double-sided non-slip rug tape.  Do not have throw rugs and other things on the floor that can make you trip. What can I do in the bedroom?  Use night lights.  Make sure that you have a light by your bed that is easy to reach.  Do not use any sheets or blankets that are too big for your bed. They should not hang down onto the floor.  Have a firm chair that has side arms. You can use this for support while you get dressed.  Do not have throw rugs and other things on the floor that can make you trip. What can I do in the kitchen?  Clean up any spills right away.  Avoid walking on wet floors.  Keep items that you use a lot in easy-to-reach places.  If you need to reach something above you, use a strong step stool that has a grab bar.  Keep electrical cords out of the way.  Do not use floor polish or wax that makes floors slippery. If you must use wax, use non-skid floor wax.  Do  not have throw rugs and other things on the floor that can make you trip. What can I do with my stairs?  Do not leave any items on the stairs.  Make sure that there are handrails on both sides of the stairs and use them. Fix handrails that are broken or loose. Make sure that handrails are as long as the stairways.  Check any carpeting to make sure that it is firmly attached to the stairs. Fix any carpet that is loose or worn.  Avoid having throw rugs at the top or bottom of the stairs. If you do have throw rugs, attach them to the floor with carpet tape.  Make sure that you have a light switch at the top of the stairs and the bottom of the stairs. If you do not have them, ask someone to add them for you. What else can I do to help prevent falls?  Wear shoes that:  Do not have high heels.  Have rubber bottoms.  Are comfortable and fit you  well.  Are closed at the toe. Do not wear sandals.  If you use a stepladder:  Make sure that it is fully opened. Do not climb a closed stepladder.  Make sure that both sides of the stepladder are locked into place.  Ask someone to hold it for you, if possible.  Clearly mark and make sure that you can see:  Any grab bars or handrails.  First and last steps.  Where the edge of each step is.  Use tools that help you move around (mobility aids) if they are needed. These include:  Canes.  Walkers.  Scooters.  Crutches.  Turn on the lights when you go into a dark area. Replace any light bulbs as soon as they burn out.  Set up your furniture so you have a clear path. Avoid moving your furniture around.  If any of your floors are uneven, fix them.  If there are any pets around you, be aware of where they are.  Review your medicines with your doctor. Some medicines can make you feel dizzy. This can increase your chance of falling. Ask your doctor what other things that you can do to help prevent falls. This information is not intended to replace advice given to you by your health care provider. Make sure you discuss any questions you have with your health care provider. Document Released: 07/12/2009 Document Revised: 02/21/2016 Document Reviewed: 10/20/2014 Elsevier Interactive Patient Education  2017 [employer].

## 2017-03-19 NOTE — Progress Notes (Signed)
Subjective:   Martha Evans is a 71 y.o. female who presents for an Initial Medicare Annual Wellness Visit.  Review of Systems    N/A  Cardiac Risk Factors include: advanced age (>51men, >2 women);dyslipidemia;hypertension;obesity (BMI >30kg/m2)     Objective:    Today's Vitals   03/19/17 1424  BP: 128/72  Pulse: 88  Temp: 99.2 F (37.3 C)  TempSrc: Oral  Weight: 202 lb 12.8 oz (92 kg)  Height: 5\' 6"  (1.676 m)  PainSc: 0-No pain   Body mass index is 32.73 kg/m.   Current Medications (verified) Outpatient Encounter Prescriptions as of 03/19/2017  Medication Sig  . ibuprofen (ADVIL,MOTRIN) 800 MG tablet Take 1 tablet (800 mg total) by mouth every 8 (eight) hours as needed.  . loratadine (CLARITIN) 10 MG tablet Take 1 tablet by mouth daily.  Marland Kitchen omeprazole (PRILOSEC) 20 MG capsule Take 20 mg by mouth 2 (two) times daily before a meal.   . tamoxifen (NOLVADEX) 20 MG tablet Take 1 tablet (20 mg total) by mouth daily. (Patient taking differently: Take 20 mg by mouth daily. )  . telmisartan (MICARDIS) 40 MG tablet Take 40 mg by mouth at bedtime.   . [DISCONTINUED] amoxicillin (AMOXIL) 875 MG tablet Take 1 tablet (875 mg total) by mouth 2 (two) times daily.   No facility-administered encounter medications on file as of 03/19/2017.     Allergies (verified) Codeine sulfate and Tramadol   History: Past Medical History:  Diagnosis Date  . Allergy   . Cancer [facility])    breast cancer right breast  . Complication of anesthesia    was hard to intubate with thyroid surgery-99  . Difficult intubation 1999   when had thyroid lobectomy  . Dysrhythmia    irregular reuglar heart beat   . GERD (gastroesophageal reflux disease)   . History of chicken pox   . History of measles   . Hypertension   . Personal history of chemotherapy   . Personal history of radiation therapy   . Wears hearing aid    right   Past Surgical History:  Procedure Laterality Date  . ABDOMINAL  HYSTERECTOMY  1999  . APPENDECTOMY     as child  . BREAST REDUCTION SURGERY Bilateral    1980's  . BREAST SURGERY Right 12/01/2013   ultrasound guided  . COLON RESECTION N/A 09/15/2014   Procedure: Laparoscopic hand assisted colostomy reversal with resection of left colon and laparoscopic lysis of adhesions ;  Surgeon: Alphonsa Overall, MD;  [facility];  Service: General;  Laterality: N/A;  . COLONOSCOPY    . COLONOSCOPY N/A 08/22/2014   Procedure: COLONOSCOPY;  Surgeon: Alphonsa Overall, MD;  Location: [facility];  Service: General;  Laterality: N/A;  . LAPAROTOMY N/A 02/11/2014   Procedure: EXPLORATORY LAPAROTOMY, COLON RESECTION, COLOSTOMY;  Surgeon: Shann Medal, MD;  [facility];  Service: General;  Laterality: N/A;  . PARTIAL MASTECTOMY WITH NEEDLE LOCALIZATION AND AXILLARY SENTINEL LYMPH NODE BX Right 12/12/2013   Procedure: PARTIAL MASTECTOMY WITH NEEDLE LOCALIZATION AND AXILLARY SENTINEL LYMPH NODE BX;  Surgeon: Earnstine Regal, MD;  Location: [place];  Service: General;  Laterality: Right;  . PORT-A-CATH REMOVAL Left 04/20/2015   Procedure: REMOVAL PORT-A-CATH;  Surgeon: Armandina Gemma, MD;  Location: [place];  Service: General;  Laterality: Left;  . PORTACATH PLACEMENT Left 01/02/2014   Procedure: INSERTION PORT-A-CATH;  Surgeon: Earnstine Regal, MD;  [facility];  Service: General;  Laterality: Left;  .  ROTATOR CUFF REPAIR  2006   right shoulder  . THYROID LOBECTOMY  1999   right  . TONSILLECTOMY     as child   Family History  Problem Relation Age of Onset  . Cancer Father        bladder & pancreatic  . Alzheimer's disease Mother   . Diabetes Brother    Social History   Occupational History  . Retired    Social History Main Topics  . Smoking status: Never Smoker  . Smokeless tobacco: Never Used  . Alcohol use Yes     Comment: 1/monthly  . Drug use: No  . Sexual activity: Yes    Tobacco Counseling Counseling given: Not  Answered   Activities of Daily Living In your present state of health, do you have any difficulty performing the following activities: 03/19/2017  Hearing? Y  Vision? N  Difficulty concentrating or making decisions? N  Walking or climbing stairs? N  Dressing or bathing? N  Doing errands, shopping? N  Preparing Food and eating ? N  Using the Toilet? N  In the past six months, have you accidently leaked urine? Y  Do you have problems with loss of bowel control? N  Managing your Medications? N  Managing your Finances? N  Housekeeping or managing your Housekeeping? N  Some recent data might be hidden    Immunizations and Health Maintenance Immunization History  Administered Date(s) Administered  . Pneumococcal Polysaccharide-23 06/30/2013  . Tdap 12/31/2012  . Zoster 12/31/2012   There are no preventive care reminders to display for this patient.  Patient Care Team: Birdie Sons, MD as PCP - General (Family Medicine) Ubaldo Glassing Javier Docker, MD as Consulting Physician (Cardiology) Armandina Gemma, MD as Consulting Physician (General Surgery) Magrinat, Virgie Dad, MD as Consulting Physician (Oncology) Noreene Filbert, MD as Consulting Physician Alphonsa Overall, MD as Consulting Physician (General Surgery)  Indicate any recent Medical Services you may have received from other than [facility] providers in the past year (date may be approximate).     Assessment:   This is a routine wellness examination for Martha Evans.   Hearing/Vision screen Vision Screening Comments: Pt goes to [facility] for vision checks every 2 years.   Dietary issues and exercise activities discussed: Current Exercise Habits: Home exercise routine, Type of exercise: walking;Other - see comments (swimming), Time (Minutes): 25, Frequency (Times/Week): 3 (to 4 days), Weekly Exercise (Minutes/Week): 75, Intensity: Mild  Goals    . Reduce sugar intake to X grams per day          Recommend decreasing sugar and  carbohydrate intake. Pt to start cutting out breads and sweets.       Depression Screen PHQ 2/9 Scores 03/19/2017 03/19/2017 02/08/2015  PHQ - 2 Score 0 0 0  PHQ- 9 Score 0 - -    Fall Risk Fall Risk  03/19/2017 12/17/2016 02/08/2015  Falls in the past year? No No No    Cognitive Function:     6CIT Screen 03/19/2017  What Year? 0 points  What month? 0 points  What time? 0 points  Count back from 20 0 points  Months in reverse 0 points  Repeat phrase 0 points  Total Score 0    Screening Tests Health Maintenance  Topic Date Due  . INFLUENZA VACCINE  06/19/2017 (Originally 04/29/2017)  . PNA vac Low Risk Adult (2 of 2 - PCV13) 02/27/2018 (Originally 06/30/2014)  . MAMMOGRAM  01/15/2019  . TETANUS/TDAP  01/01/2023  .  COLONOSCOPY  08/22/2024  . DEXA SCAN  Completed  . Hepatitis C Screening  Completed      Plan:  I have personally reviewed and addressed the Medicare Annual Wellness questionnaire and have noted the following in the patient's chart:  A. Medical and social history B. Use of alcohol, tobacco or illicit drugs  C. Current medications and supplements D. Functional ability and status E.  Nutritional status F.  Physical activity G. Advance directives H. List of other physicians I.  Hospitalizations, surgeries, and ER visits in previous 12 months J.  Sharpes such as hearing and vision if needed, cognitive and depression L. Referrals and appointments - none  In addition, I have reviewed and discussed with patient certain preventive protocols, quality metrics, and best practice recommendations. A written personalized care plan for preventive services as well as general preventive health recommendations were provided to patient.  See attached scanned questionnaire for additional information.   Signed,  Fabio Neighbors, LPN Nurse Health Advisor   MD Recommendations: None, pt declined pneumonia vaccine today.

## 2017-04-15 DIAGNOSIS — I10 Essential (primary) hypertension: Secondary | ICD-10-CM | POA: Diagnosis not present

## 2017-04-15 DIAGNOSIS — E7800 Pure hypercholesterolemia, unspecified: Secondary | ICD-10-CM | POA: Diagnosis not present

## 2017-04-15 DIAGNOSIS — I517 Cardiomegaly: Secondary | ICD-10-CM | POA: Diagnosis not present

## 2017-04-15 DIAGNOSIS — K219 Gastro-esophageal reflux disease without esophagitis: Secondary | ICD-10-CM | POA: Diagnosis not present

## 2017-05-09 DIAGNOSIS — M47816 Spondylosis without myelopathy or radiculopathy, lumbar region: Secondary | ICD-10-CM | POA: Diagnosis not present

## 2017-05-09 DIAGNOSIS — M545 Low back pain: Secondary | ICD-10-CM | POA: Diagnosis not present

## 2017-05-09 DIAGNOSIS — S7001XA Contusion of right hip, initial encounter: Secondary | ICD-10-CM | POA: Diagnosis not present

## 2017-05-12 ENCOUNTER — Encounter (INDEPENDENT_AMBULATORY_CARE_PROVIDER_SITE_OTHER): Payer: Self-pay | Admitting: Orthopaedic Surgery

## 2017-05-12 ENCOUNTER — Telehealth (INDEPENDENT_AMBULATORY_CARE_PROVIDER_SITE_OTHER): Payer: Self-pay

## 2017-05-12 ENCOUNTER — Ambulatory Visit (INDEPENDENT_AMBULATORY_CARE_PROVIDER_SITE_OTHER): Payer: PPO | Admitting: Orthopaedic Surgery

## 2017-05-12 ENCOUNTER — Ambulatory Visit (HOSPITAL_COMMUNITY)
Admission: RE | Admit: 2017-05-12 | Discharge: 2017-05-12 | Disposition: A | Discharge status: Home / Self Care | Payer: PPO | Source: Ambulatory Visit | Attending: Vascular Surgery | Admitting: Vascular Surgery

## 2017-05-12 VITALS — BP 131/72 | HR 70 | Resp 14 | Ht 65.0 in | Wt 200.0 lb

## 2017-05-12 DIAGNOSIS — I999 Unspecified disorder of circulatory system: Secondary | ICD-10-CM

## 2017-05-12 DIAGNOSIS — M79604 Pain in right leg: Secondary | ICD-10-CM | POA: Diagnosis not present

## 2017-05-12 DIAGNOSIS — M7989 Other specified soft tissue disorders: Secondary | ICD-10-CM | POA: Diagnosis not present

## 2017-05-12 LAB — CBC WITH DIFFERENTIAL/PLATELET
BASOS ABS: 0 {cells}/uL (ref 0–200)
Basophils Relative: 0 %
EOS PCT: 0 %
Eosinophils Absolute: 0 cells/uL — ABNORMAL LOW (ref 15–500)
HCT: 28.1 % — ABNORMAL LOW (ref 35.0–45.0)
Hemoglobin: 9.4 g/dL — ABNORMAL LOW (ref 11.7–15.5)
Lymphocytes Relative: 13 %
Lymphs Abs: 1495 cells/uL (ref 850–3900)
MCH: 32 pg (ref 27.0–33.0)
MCHC: 33.5 g/dL (ref 32.0–36.0)
MCV: 95.6 fL (ref 80.0–100.0)
MONOS PCT: 9 %
MPV: 9.7 fL (ref 7.5–12.5)
Monocytes Absolute: 1035 cells/uL — ABNORMAL HIGH (ref 200–950)
NEUTROS PCT: 78 %
Neutro Abs: 8970 cells/uL — ABNORMAL HIGH (ref 1500–7800)
PLATELETS: 246 10*3/uL (ref 140–400)
RBC: 2.94 MIL/uL — ABNORMAL LOW (ref 3.80–5.10)
RDW: 13.5 % (ref 11.0–15.0)
WBC: 11.5 10*3/uL — ABNORMAL HIGH (ref 3.8–10.8)

## 2017-05-12 NOTE — Telephone Encounter (Signed)
See message.

## 2017-05-12 NOTE — Progress Notes (Signed)
Office Visit Note   Patient: Martha Evans           Date of Birth: [birth year]           MRN: [id number] Visit Date: 05/12/2017              Requested by: Birdie Sons, [address] [place], [place] [address] PCP: Birdie Sons, MD   Assessment & Plan: Visit Diagnoses:  1. Vascular abnormality   2. Pain in right leg   Pain right thigh is probably related to buttock bruise and possible injury to proximal hamstring. Doubt DVT of thigh but will obtain Doppler study  Plan: As above. Heat, anti-inflammatory medicines, limitation of activities and time assuming Doppler studies negative. Suspect this was all resolve on its own. Also obtain a CBC to be sure there were no issues with platelets  Follow-Up Instructions: Return in about 1 week (around 05/19/2017).   Orders:  Orders Placed This Encounter  Procedures  . CBC with Differential   No orders of the defined types were placed in this encounter.     Procedures: No procedures performed   Clinical Data: No additional findings.   Subjective: Chief Complaint  Patient presents with  . Right Leg - Numbness, Edema, Bleeding/Bruising    On Saturday pt noticed bruising on the back of her R leg while at the beach. Went to ED and XR obtained. Chills, painful to walk, tightness behind the knee.   Mrs. Martha Evans fell and landed on a tree stop injuring her low right buttock about 3 weeks ago. She "didn't think anything of it "but when she was at the beach this past weekend she noted an area of ecchymosis behind her right knee. Su is been having some "tightness" of her hamstring. She was seen at a local facility in [facility] where x-rays were obtained and were apparently negative. She's just a little concerned because of the "blood clot'. She denies any numbness or tingling. She's not had any shortness of breath or chest pain. No fever or chills. She she never had any ecchymosis of her buttock. Is also developed a new area  of ecchymosis along the medial right thigh. No distal edema. She does have history of breast cancer and is on tamoxifen  HPI  Review of Systems  Constitutional: Positive for chills. Negative for fatigue and fever.  Eyes: Negative for itching.  Respiratory: Negative for chest tightness and shortness of breath.   Cardiovascular: Positive for leg swelling. Negative for chest pain and palpitations.  Gastrointestinal: Negative for blood in stool, constipation and diarrhea.  Musculoskeletal: Negative for back pain, joint swelling, neck pain and neck stiffness.  Neurological: Negative for dizziness, weakness, numbness and headaches.  Hematological: Bruises/bleeds easily.  Psychiatric/Behavioral: Negative for sleep disturbance. The patient is not nervous/anxious.      Objective: Vital Signs: BP 131/72   Pulse 70   Resp 14   Ht 5\' 5"  (1.651 m)   Wt 200 lb (90.7 kg)   BMI 33.28 kg/m   Physical Exam  Ortho Exam Mild tenderness along the entire right posterior thigh. Area of ecchymosis that appears to be resolving in the popliteal area. Good pulses. No knee pain or swelling. No instability. No pain with range of motion of her right hip. Mild tenderness in the area of her right buttock but no evidence of hematoma formation. Area of relatively new ecchymosis in the mid medial right thigh otherwise skin is intact. She  walks with a slight limp because of the tightness of her hamstring. No swelling distally neurovascular exam intact to right foot. No calf discomfort. Negative Homans. No back discomfort. Specialty Comments:  No specialty comments available.  Imaging: No results found.   PMFS History: Patient Active Problem List   Diagnosis Date Noted  . Allergic rhinitis 10/11/2015  . Colon polyp 10/11/2015  . 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 Vitamin D deficiency 10/11/2015  . Cardiac enlargement 05/24/2015  . Acid reflux 05/24/2015  . Hypercholesteremia 05/24/2015  . BP (high blood  pressure) 05/24/2015  . Hot flashes 11/22/2014  . Arthralgia 11/22/2014  . S/P colostomy takedown 09/15/14 09/15/2014  . History of diverticular abscess of colon 07/19/2014  . 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 ARF (acute renal failure) ([place]) 02/17/2014  . Malignant neoplasm of lower-outer quadrant of right breast of female, estrogen receptor positive ([place]) 12/07/2013  . Multiple thyroid nodules, left lobe 07/25/2013  . History of thyroid lobectomy, right 07/25/2013  . Herpes zoster without complication 03/24/9484   Past Medical History:  Diagnosis Date  . Allergy   . Cancer [facility])    breast cancer right breast  . Complication of anesthesia    was hard to intubate with thyroid surgery-99  . Difficult intubation 1999   when had thyroid lobectomy  . Dysrhythmia    irregular reuglar heart beat   . GERD (gastroesophageal reflux disease)   . History of chicken pox   . History of measles   . Hypertension   . Personal history of chemotherapy   . Personal history of radiation therapy   . Wears hearing aid    right    Family History  Problem Relation Age of Onset  . Cancer Father        bladder & pancreatic  . Alzheimer's disease Mother   . Diabetes Brother     Past Surgical History:  Procedure Laterality Date  . ABDOMINAL HYSTERECTOMY  1999  . APPENDECTOMY     as child  . BREAST REDUCTION SURGERY Bilateral    1980's  . BREAST SURGERY Right 12/01/2013   ultrasound guided  . COLON RESECTION N/A 09/15/2014   Procedure: Laparoscopic hand assisted colostomy reversal with resection of left colon and laparoscopic lysis of adhesions ;  Surgeon: Alphonsa Overall, MD;  [facility];  Service: General;  Laterality: N/A;  . COLONOSCOPY    . COLONOSCOPY N/A 08/22/2014   Procedure: COLONOSCOPY;  Surgeon: Alphonsa Overall, MD;  Location: [facility];  Service: General;  Laterality: N/A;  . LAPAROTOMY N/A 02/11/2014   Procedure: EXPLORATORY LAPAROTOMY, COLON RESECTION, COLOSTOMY;   Surgeon: Shann Medal, MD;  [facility];  Service: General;  Laterality: N/A;  . PARTIAL MASTECTOMY WITH NEEDLE LOCALIZATION AND AXILLARY SENTINEL LYMPH NODE BX Right 12/12/2013   Procedure: PARTIAL MASTECTOMY WITH NEEDLE LOCALIZATION AND AXILLARY SENTINEL LYMPH NODE BX;  Surgeon: Earnstine Regal, MD;  Location: [place];  Service: General;  Laterality: Right;  . PORT-A-CATH REMOVAL Left 04/20/2015   Procedure: REMOVAL PORT-A-CATH;  Surgeon: Armandina Gemma, MD;  Location: [place];  Service: General;  Laterality: Left;  . PORTACATH PLACEMENT Left 01/02/2014   Procedure: INSERTION PORT-A-CATH;  Surgeon: Earnstine Regal, MD;  [facility];  Service: General;  Laterality: Left;  . ROTATOR CUFF REPAIR  2006   right shoulder  . THYROID LOBECTOMY  1999   right  . TONSILLECTOMY     as child   Social History  Occupational History  . Retired    Social History Main Topics  . Smoking status: Never Smoker  . Smokeless tobacco: Never Used  . Alcohol use Yes     Comment: 1/monthly  . Drug use: No  . Sexual activity: Yes

## 2017-05-12 NOTE — Telephone Encounter (Signed)
[name] From Vascular lab called left voicemail in Triage phone states patient was negative for DVT. Call patient with further instructions. She will let patient go.    CB [phone]

## 2017-07-09 ENCOUNTER — Encounter: Payer: Self-pay | Admitting: Physician Assistant

## 2017-07-09 ENCOUNTER — Ambulatory Visit (INDEPENDENT_AMBULATORY_CARE_PROVIDER_SITE_OTHER): Payer: PPO | Admitting: Physician Assistant

## 2017-07-09 ENCOUNTER — Ambulatory Visit
Admission: RE | Admit: 2017-07-09 | Discharge: 2017-07-09 | Disposition: A | Discharge status: Home / Self Care | Payer: PPO | Source: Ambulatory Visit | Attending: Physician Assistant | Admitting: Physician Assistant

## 2017-07-09 VITALS — BP 132/90 | HR 74 | Temp 97.4°F | Resp 16 | Ht 66.0 in | Wt 197.6 lb

## 2017-07-09 DIAGNOSIS — R059 Cough, unspecified: Secondary | ICD-10-CM

## 2017-07-09 DIAGNOSIS — Z23 Encounter for immunization: Secondary | ICD-10-CM | POA: Diagnosis not present

## 2017-07-09 DIAGNOSIS — Z853 Personal history of malignant neoplasm of breast: Secondary | ICD-10-CM

## 2017-07-09 DIAGNOSIS — R05 Cough: Secondary | ICD-10-CM

## 2017-07-09 DIAGNOSIS — D649 Anemia, unspecified: Secondary | ICD-10-CM

## 2017-07-09 DIAGNOSIS — R079 Chest pain, unspecified: Secondary | ICD-10-CM | POA: Diagnosis not present

## 2017-07-09 LAB — CBC WITH DIFFERENTIAL/PLATELET
BASOS ABS: 32 {cells}/uL (ref 0–200)
BASOS PCT: 0.5 %
EOS ABS: 202 {cells}/uL (ref 15–500)
Eosinophils Relative: 3.2 %
HEMATOCRIT: 35.1 % (ref 35.0–45.0)
HEMOGLOBIN: 11.8 g/dL (ref 11.7–15.5)
LYMPHS ABS: 1474 {cells}/uL (ref 850–3900)
MCH: 31.6 pg (ref 27.0–33.0)
MCHC: 33.6 g/dL (ref 32.0–36.0)
MCV: 94.1 fL (ref 80.0–100.0)
MONOS PCT: 10.6 %
MPV: 11.5 fL (ref 7.5–12.5)
NEUTROS ABS: 3925 {cells}/uL (ref 1500–7800)
Neutrophils Relative %: 62.3 %
Platelets: 160 10*3/uL (ref 140–400)
RBC: 3.73 10*6/uL — ABNORMAL LOW (ref 3.80–5.10)
RDW: 11.8 % (ref 11.0–15.0)
Total Lymphocyte: 23.4 %
WBC: 6.3 10*3/uL (ref 3.8–10.8)
WBCMIX: 668 {cells}/uL (ref 200–950)

## 2017-07-09 NOTE — Progress Notes (Signed)
Patient: Martha Evans Female    DOB: Nov 19, 1945   71 y.o.   MRN: [id number] Visit Date: 07/09/2017  Today's Provider: Mar Daring, PA-C   Chief Complaint  Patient presents with  . URI   Subjective:    HPI Patient here today C/O persistent cough x's several months. Patient reports "dry tickle" on left side of throat and coughing up a lot of phlegm (cloudy). Patient reports cough is worse in the mornings and at night. Patient reports taking Mucinex DM 1 tablet BID for 1 week. Patient reports mild improvement. Patient reports she is concerned she may have pneumonia. Patient denies fever, runny nose, ear pain, or sinus pain or pressure.   Patient reports she fell in August injured her right leg, was seen by Dr. Durward Fortes in sports medicine. Patient reports she had x-rays, and lab work. Patient reports labs showed anemia and was advised to mention this to PCP. It was felt that the injury and bruising (third spacing) caused the anemia. She has not had labs checked since the injury has improved.      Allergies  Allergen Reactions  . Codeine Sulfate Nausea And Vomiting  . Tramadol Nausea Only    Nausea      Current Outpatient Prescriptions:  .  ibuprofen (ADVIL,MOTRIN) 800 MG tablet, Take 1 tablet (800 mg total) by mouth every 8 (eight) hours as needed., Disp: 90 tablet, Rfl: 0 .  loratadine (CLARITIN) 10 MG tablet, Take 1 tablet by mouth daily., Disp: , Rfl:  .  omeprazole (PRILOSEC) 20 MG capsule, Take 20 mg by mouth 2 (two) times daily before a meal. , Disp: , Rfl:  .  tamoxifen (NOLVADEX) 20 MG tablet, Take 1 tablet (20 mg total) by mouth daily., Disp: 90 tablet, Rfl: 3 .  telmisartan (MICARDIS) 40 MG tablet, Take 40 mg by mouth at bedtime. , Disp: , Rfl:   Review of Systems  Constitutional: Negative.   HENT: Negative for congestion, ear pain, nosebleeds, postnasal drip, rhinorrhea, sinus pain, sinus pressure, sneezing, sore throat, trouble swallowing and voice  change.   Respiratory: Positive for cough and shortness of breath. Negative for chest tightness and wheezing.   Cardiovascular: Negative.   Gastrointestinal: Negative.   Neurological: Negative.     Social History  Substance Use Topics  . Smoking status: Never Smoker  . Smokeless tobacco: Never Used  . Alcohol use Yes     Comment: 1/monthly   Objective:   BP 132/90 (BP Location: Left Arm, Patient Position: Sitting, Cuff Size: Large)   Pulse 74   Temp (!) 97.4 F (36.3 C) (Oral)   Resp 16   Ht 5\' 6"  (1.676 m)   Wt 197 lb 9.6 oz (89.6 kg)   SpO2 98%   BMI 31.89 kg/m  Vitals:   07/09/17 0819  BP: 132/90  Pulse: 74  Resp: 16  Temp: (!) 97.4 F (36.3 C)  TempSrc: Oral  SpO2: 98%  Weight: 197 lb 9.6 oz (89.6 kg)  Height: 5\' 6"  (1.676 m)     Physical Exam  Constitutional: She appears well-developed and well-nourished. No distress.  HENT:  Head: Normocephalic and atraumatic.  Right Ear: Hearing, tympanic membrane, external ear and ear canal normal.  Left Ear: Hearing, tympanic membrane, external ear and ear canal normal.  Nose: Nose normal.  Mouth/Throat: Uvula is midline, oropharynx is clear and moist and mucous membranes are normal. No oropharyngeal exudate.  Eyes: Pupils are equal, round, and reactive  to light. Conjunctivae are normal. Right eye exhibits no discharge. Left eye exhibits no discharge. No scleral icterus.  Neck: Normal range of motion. Neck supple. No tracheal deviation present. No thyromegaly present.  Cardiovascular: Normal rate, regular rhythm and normal heart sounds.  Exam reveals no gallop and no friction rub.   No murmur heard. Pulmonary/Chest: Effort normal. No stridor. No respiratory distress. She has no decreased breath sounds. She has wheezes in the right middle field. She has no rhonchi. She has no rales.  Lymphadenopathy:    She has no cervical adenopathy.  Skin: Skin is warm and dry. She is not diaphoretic.  Vitals reviewed.        Assessment & Plan:     1. Cough Spirometry normal today. Will get CXR to R/O scarring that may have occurred from injury secondary to chest radiation with breast cancer. I will f/u pending xray. If normal will treat as possible post nasal drainage/allergies and add flonase. She is agreeable to this.  - DG Chest 2 View; Future - Spirometry with Graph  2. History of breast cancer See above medical treatment plan. - DG Chest 2 View; Future  3. Anemia, unspecified type Was found to have decreased HgB following injury to right leg that caused a very large hematoma. Will recheck to make sure HgB has rebounded.  - CBC w/Diff/Platelet  4. Need for influenza vaccination Flu vaccine given today without complication. Patient sat upright for 15 minutes to check for adverse reaction before being released. - Flu vaccine HIGH DOSE PF (Fluzone High dose)  5. Need for pneumococcal vaccination Pneumonia Vaccine given to patient without complications. Patient sat for 15 minutes after administration and was tolerated well without adverse effects. - Pneumococcal conjugate vaccine 13-valent       Mar Daring, PA-C  [facility]

## 2017-07-09 NOTE — Patient Instructions (Signed)
Cough, Adult Coughing is a reflex that clears your throat and your airways. Coughing helps to heal and protect your lungs. It is normal to cough occasionally, but a cough that happens with other symptoms or lasts a long time may be a sign of a condition that needs treatment. A cough may last only 2-3 weeks (acute), or it may last longer than 8 weeks (chronic). What are the causes? Coughing is commonly caused by:  Breathing in substances that irritate your lungs.  A viral or bacterial respiratory infection.  Allergies.  Asthma.  Postnasal drip.  Smoking.  Acid backing up from the stomach into the esophagus (gastroesophageal reflux).  Certain medicines.  Chronic lung problems, including COPD (or rarely, lung cancer).  Other medical conditions such as heart failure.  Follow these instructions at home: Pay attention to any changes in your symptoms. Take these actions to help with your discomfort:  Take medicines only as told by your health care provider. ? If you were prescribed an antibiotic medicine, take it as told by your health care provider. Do not stop taking the antibiotic even if you start to feel better. ? Talk with your health care provider before you take a cough suppressant medicine.  Drink enough fluid to keep your urine clear or pale yellow.  If the air is dry, use a cold steam vaporizer or humidifier in your bedroom or your home to help loosen secretions.  Avoid anything that causes you to cough at work or at home.  If your cough is worse at night, try sleeping in a semi-upright position.  Avoid cigarette smoke. If you smoke, quit smoking. If you need help quitting, ask your health care provider.  Avoid caffeine.  Avoid alcohol.  Rest as needed.  Contact a health care provider if:  You have new symptoms.  You cough up pus.  Your cough does not get better after 2-3 weeks, or your cough gets worse.  You cannot control your cough with suppressant  medicines and you are losing sleep.  You develop pain that is getting worse or pain that is not controlled with pain medicines.  You have a fever.  You have unexplained weight loss.  You have night sweats. Get help right away if:  You cough up blood.  You have difficulty breathing.  Your heartbeat is very fast. This information is not intended to replace advice given to you by your health care provider. Make sure you discuss any questions you have with your health care provider. Document Released: 03/14/2011 Document Revised: 02/21/2016 Document Reviewed: 11/22/2014 Elsevier Interactive Patient Education  2017 Elsevier Inc.  

## 2017-07-10 ENCOUNTER — Telehealth: Payer: Self-pay

## 2017-07-10 DIAGNOSIS — J301 Allergic rhinitis due to pollen: Secondary | ICD-10-CM

## 2017-07-10 NOTE — Telephone Encounter (Signed)
[email or web address]

## 2017-07-10 NOTE — Telephone Encounter (Signed)
-----   Message from Mar Daring, [place] sent at 07/09/2017 10:40 AM EDT ----- CXR is normal. There is some degenerative disc in the thoracic spine. This would not cause cough but could cause some mid back pain/discomfort.

## 2017-07-10 NOTE — Telephone Encounter (Signed)
-----   Message from Mar Daring, [place] sent at 07/10/2017  8:23 AM EDT ----- No elevated WBC count that could indicate infection and hemoglobin is back to normal at 11.8 now.

## 2017-07-14 MED ORDER — FLUTICASONE PROPIONATE 50 MCG/ACT NA SUSP: 2.0000 | Freq: Every day | NASAL | 6 refills | Status: DC

## 2017-07-14 NOTE — Telephone Encounter (Signed)
Medication sent.

## 2017-07-14 NOTE — Telephone Encounter (Signed)
Pt advised as below. Patient states she would like to go ahead and proceed with Flonase spray. She would like this sent in to [address]. She is leaving to go out of town in the morning and would like to get this today please-Anastasiya V Hopkins, RMA

## 2017-11-09 ENCOUNTER — Other Ambulatory Visit: Payer: PPO

## 2017-11-09 ENCOUNTER — Ambulatory Visit: Payer: PPO | Admitting: Oncology

## 2017-11-09 NOTE — Progress Notes (Signed)
[name]  [phone] Fax:([phone]) [phone]     ID: Martha Evans DOB: 1946/06/09  MR#: [id number]  PRF#:[id number]  Patient Care Team: Birdie Sons, MD as PCP - General (Family Medicine) Ubaldo Glassing Javier Docker, MD as Consulting Physician (Cardiology) Armandina Gemma, MD as Consulting Physician (General Surgery) Domonick Sittner, Virgie Dad, MD as Consulting Physician (Oncology) Noreene Filbert, MD as Consulting Physician Alphonsa Overall, MD as Consulting Physician (General Surgery)  CHIEF COMPLAINT: Metaplastic breast cancer  CURRENT TREATMENT:  Tamoxifen  BREAST CANCER HISTORY: From a Dr. Laurelyn Sickle initial intake note 12/21/2013:  "Martha Evans is a 72 y.o. female. Would medical history significant for hypertension gastroesophageal reflux disease. Patient underwent bilateral breast reductions in the 1980s. In February 2015 she had screening mammograms performed that showed a mass in the central right breast. She had ultrasound performed which revealed a 6 x 4 x 6 mm high-grade calcifications. Biopsy of these calcifications revealed invasive ductal carcinoma. On 12/12/2013 patient underwent a lumpectomy without sentinel lymph node biopsy. 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 Tumor was ER +2% PR +2% HER-2/neu negative with an elevated Ki-67 of 84%. Of note patient [could not] undergo a sentinel lymph node biopsy because of previous breast reduction."  The patient was started on adjuvant chemotherapy with cyclophosphamide and docetaxel 01/16/2014, but treatment was interrupted by a perforated diverticulum requiring partial colectomy 02/13/2014.   Her subsequent history is as detailed below  INTERVAL HISTORY: Martha Evans returns today for follow-up of her breast cancer which was very minimally estrogen and progesterone receptor positive. She continues on tamoxifen, with good tolerance. She reports that she has few hot flashes, water gain,  and mild vaginal discharge which she wears a mini pad for.   Since her last visit to the office, she had a US Thyroid completed on 02/03/2017 with results of: IMPRESSION: There are multiple left-sided nodules as described. None of these meet criteria for follow-up or biopsy. They are grossly unchanged in size based on the prior report. Status post right lobectomy without evidence of recurrence or residual soft tissue. She also had a CXR on 07/09/17 with results of: IMPRESSION: There is no active cardiopulmonary disease.    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 She also took plenty of walks while at the beach. 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 She denies unusual headaches, visual changes, nausea, vomiting, or dizziness. There has been no unusual cough, phlegm production, or pleurisy. This been no change in bowel or bladder habits. She denies unexplained fatigue or unexplained weight loss, bleeding, rash, or fever. A detailed review of systems was otherwise stable.    PAST MEDICAL HISTORY: Past Medical History:  Diagnosis Date  . Allergy   . Cancer [facility])    breast cancer right breast  . Complication of anesthesia    was hard to intubate with thyroid surgery-99  . Difficult intubation 1999   when had thyroid lobectomy  . Dysrhythmia    irregular reuglar heart beat   . GERD (gastroesophageal reflux disease)   . History of chicken pox   . History of measles   . Hypertension   . Personal history of chemotherapy   . Personal history of radiation therapy   .  Wears hearing aid    right    PAST SURGICAL HISTORY: Past Surgical History:  Procedure Laterality Date  . ABDOMINAL HYSTERECTOMY  1999  . APPENDECTOMY     as child  . BREAST REDUCTION SURGERY Bilateral    1980's  .  BREAST SURGERY Right 12/01/2013   ultrasound guided  . COLON RESECTION N/A 09/15/2014   Procedure: Laparoscopic hand assisted colostomy reversal with resection of left colon and laparoscopic lysis of adhesions ;  Surgeon: Alphonsa Overall, MD;  Location: [facility];  Service: General;  Laterality: N/A;  . COLONOSCOPY    . COLONOSCOPY N/A 08/22/2014   Procedure: COLONOSCOPY;  Surgeon: Alphonsa Overall, MD;  Location: [facility];  Service: General;  Laterality: N/A;  . LAPAROTOMY N/A 02/11/2014   Procedure: EXPLORATORY LAPAROTOMY, COLON RESECTION, COLOSTOMY;  Surgeon: Shann Medal, MD;  [facility];  Service: General;  Laterality: N/A;  . PARTIAL MASTECTOMY WITH NEEDLE LOCALIZATION AND AXILLARY SENTINEL LYMPH NODE BX Right 12/12/2013   Procedure: PARTIAL MASTECTOMY WITH NEEDLE LOCALIZATION AND AXILLARY SENTINEL LYMPH NODE BX;  Surgeon: Earnstine Regal, MD;  Location: [facility];  Service: General;  Laterality: Right;  . PORT-A-CATH REMOVAL Left 04/20/2015   Procedure: REMOVAL PORT-A-CATH;  Surgeon: Armandina Gemma, MD;  Location: [facility];  Service: General;  Laterality: Left;  . PORTACATH PLACEMENT Left 01/02/2014   Procedure: INSERTION PORT-A-CATH;  Surgeon: Earnstine Regal, MD;  [facility];  Service: General;  Laterality: Left;  . ROTATOR CUFF REPAIR  2006   right shoulder  . THYROID LOBECTOMY  1999   right  . TONSILLECTOMY     as child    FAMILY HISTORY Family History  Problem Relation Age of Onset  . Cancer Father        bladder & pancreatic  . Alzheimer's disease Mother   . Diabetes Brother   The patient's father died from pancreatic cancer the age of 46. The patient's mother is alive, age 23, with significant Alzheimer's disease. The patient had one brother, no sisters. There are no first-degree relatives with breast or ovarian cancer.  GYNECOLOGIC HISTORY:  No LMP recorded. Patient has had a hysterectomy. Menarche age 21. The patient is GX P0. She  underwent hysterectomy with bilateral salpingo-oophorectomy in 1999. She used an estrogen patch for several years after that.   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 He has 3 children from a prior marriage, living in [place], [place], and both work. The patient attends a local [employer]    ADVANCED DIRECTIVES: Not in place   HEALTH MAINTENANCE: Social History   Tobacco Use  . Smoking status: Never Smoker  . Smokeless tobacco: Never Used  Substance Use Topics  . Alcohol use: Yes    Comment: 1/monthly  . Drug use: No     Colonoscopy:  PAP: Status post hysterectomy  Bone density:  Lipid panel:  Allergies  Allergen Reactions  . Codeine Sulfate Nausea And Vomiting  . Tramadol Nausea Only    Nausea     Current Outpatient Medications  Medication Sig Dispense Refill  . fluticasone (FLONASE) 50 MCG/ACT nasal spray Place 2 sprays into both nostrils daily. 16 g 6  . ibuprofen (  ADVIL,MOTRIN) 800 MG tablet Take 1 tablet (800 mg total) by mouth every 8 (eight) hours as needed. 90 tablet 0  . loratadine (CLARITIN) 10 MG tablet Take 1 tablet by mouth daily.    Marland Kitchen omeprazole (PRILOSEC) 20 MG capsule Take 20 mg by mouth 2 (two) times daily before a meal.     . tamoxifen (NOLVADEX) 20 MG tablet Take 1 tablet (20 mg total) by mouth daily. 90 tablet 3  . telmisartan (MICARDIS) 40 MG tablet Take 40 mg by mouth at bedtime.      No current facility-administered medications for this visit.     OBJECTIVE: Middle-aged white woman in no acute distress  Vitals:   11/16/17 1325  BP: (!) 143/74  Pulse: 78  Resp: 20  Temp: 98.5 F (36.9 C)  SpO2: 98%     Body mass index is 32.3 kg/m.    ECOG FS:0 -  Asymptomatic  Sclerae unicteric, EOMs intact Oropharynx clear and moist No cervical or supraclavicular adenopathy Lungs no rales or rhonchi Heart regular rate and rhythm Abd soft, nontender, positive bowel sounds MSK no focal spinal tenderness, no upper extremity lymphedema Neuro: nonfocal, well oriented, appropriate affect Breasts: The right breast is status post lumpectomy followed by radiation with no evidence of local recurrence.  Left breast is benign.  Both axillae are benign  LAB RESULTS:  CMP     Component Value Date/Time   NA 140 11/16/2017 1244   NA 143 01/26/2017 1351   K 3.9 11/16/2017 1244   K 4.0 01/26/2017 1351   CL 107 11/16/2017 1244   CO2 23 11/16/2017 1244   CO2 24 01/26/2017 1351   GLUCOSE 144 (H) 11/16/2017 1244   GLUCOSE 153 (H) 01/26/2017 1351   BUN 19 11/16/2017 1244   BUN 26.1 (H) 01/26/2017 1351   CREATININE 1.09 11/16/2017 1244   CREATININE 1.1 01/26/2017 1351   CALCIUM 9.3 11/16/2017 1244   CALCIUM 9.0 01/26/2017 1351   PROT 6.6 11/16/2017 1244   PROT 6.5 01/26/2017 1351   ALBUMIN 3.5 11/16/2017 1244   ALBUMIN 3.5 01/26/2017 1351   AST 25 11/16/2017 1244   AST 19 01/26/2017 1351   ALT 22 11/16/2017 1244   ALT 28 01/26/2017 1351   ALKPHOS 98 11/16/2017 1244   ALKPHOS 102 01/26/2017 1351   BILITOT 0.4 11/16/2017 1244   BILITOT 0.28 01/26/2017 1351   GFRNONAA 50 (L) 11/16/2017 1244   GFRAA 58 (L) 11/16/2017 1244    I No results found for: SPEP  Lab Results  Component Value Date   WBC 6.1 11/16/2017   NEUTROABS 4.2 11/16/2017   HGB 11.9 11/16/2017   HCT 36.0 11/16/2017   MCV 95.3 11/16/2017   PLT 152 11/16/2017      Chemistry      Component Value Date/Time   NA 140 11/16/2017 1244   NA 143 01/26/2017 1351   K 3.9 11/16/2017 1244   K 4.0 01/26/2017 1351   CL 107 11/16/2017 1244   CO2 23 11/16/2017 1244   CO2 24 01/26/2017 1351   BUN 19 11/16/2017 1244   BUN 26.1 (H) 01/26/2017 1351   CREATININE 1.09 11/16/2017 1244    CREATININE 1.1 01/26/2017 1351   GLU 94 12/31/2012      Component Value Date/Time   CALCIUM 9.3 11/16/2017 1244   CALCIUM 9.0 01/26/2017 1351   ALKPHOS 98 11/16/2017 1244   ALKPHOS 102 01/26/2017 1351   AST 25 11/16/2017 1244   AST 19 01/26/2017 1351  ALT 22 11/16/2017 1244   ALT 28 01/26/2017 1351   BILITOT 0.4 11/16/2017 1244   BILITOT 0.28 01/26/2017 1351       No results found for: LABCA2  No components found for: LABCA125  No results for input(s): INR in the last 168 hours.  Urinalysis    Component Value Date/Time   COLORURINE YELLOW 02/17/2014 1639   APPEARANCEUR CLOUDY (A) 02/17/2014 1639   LABSPEC 1.016 02/17/2014 1639   PHURINE 5.0 02/17/2014 1639   GLUCOSEU NEGATIVE 02/17/2014 1639   HGBUR NEGATIVE 02/17/2014 1639   BILIRUBINUR negative 12/17/2016 1117   KETONESUR NEGATIVE 02/17/2014 1639   PROTEINUR negative 12/17/2016 1117   PROTEINUR 30 (A) 02/17/2014 1639   UROBILINOGEN 0.2 12/17/2016 1117   UROBILINOGEN 1.0 02/17/2014 1639   NITRITE negative 12/17/2016 1117   NITRITE NEGATIVE 02/17/2014 1639   LEUKOCYTESUR Negative 12/17/2016 1117    STUDIES: Since her last visit to the office, she had a US Thyroid completed on 02/03/2017 with results of: IMPRESSION: There are multiple left-sided nodules as described. None of these meet criteria for follow-up or biopsy. They are grossly unchanged in size based on the prior report. Status post right lobectomy without evidence of recurrence or residual soft tissue.   CXR on 07/09/17 with results of: IMPRESSION: There is no active cardiopulmonary disease.   ASSESSMENT: 72 y.o. [place] woman  (1) status post right breast Lower outer quadrant biopsy 12/01/2013 for an invasive ductal carcinoma, grade 2 or 3, estrogen receptor 2% "positive", progesterone receptor 2% "positive", both with moderate staining intensity, with an MIB-1 of 84%, and no HER-2 amplification  (2) status post right lumpectomy 12/12/2013  ([id number]) for a pT1b pNX invasive ductal carcinoma, metaplastic, high-grade, estrogen and progesterone receptor negative, repeat HER-2 again negative. Because of prior reduction mammoplasty sentinel lymph node sampling was not performed  (3) adjuvant chemotherapy with cyclophosphamide and docetaxel started 58/85/0277, complicated by peripheral neuropathy, and interrupted after 2 cycles because of diverticular perforation requiring partial colectomy (02/13/2014)  (4) completed 2 additional cycles of chemotherapy 05/22/2014, consisting of carboplatin given day 1 and gemcitabine days 1 and 8 of each 21 day cycle, with Neulasta on day 9.  (5) adjuvant radiation under Dr. Berton Mount followed chemotherapy  (6) tamoxifen started January 2016 stopped 2 weeks into drug;  restarted in February 2016  (a) bone density 01/14/2017 finds a T score of -1.4  (7) diverticulitis, status post partial colectomy with colostomy placement may 05/18/2014, status post colostomy takedown 09/15/2014  PLAN: Remas is now just about 4 years out from definitive surgery for her breast cancer with no evidence of disease recurrence.  This is very favorable.  She has been on tamoxifen 3 years.  I think one additional year would be sufficient for her since she had such a low level of positivity.  Accordingly she will likely "graduate" at her next visit.  Since her thyroid nodules were stable at the last ultrasound I do not see an urgent need to repeat them at this point  She will return to see me in a year.  She knows to call for any problems that may develop before that visit.  Gordie Crumby, Virgie Dad, MD  11/16/17 1:44 PM Medical Oncology and Hematology [facility] [address] [place], [place] [address] Tel. [phone]    Fax. [phone]    This document serves as a record of services personally performed by Lurline Del, MD. It was created on his behalf by [facility], a  trained medical  scribe. The creation of this record is based on the scribe's personal observations and the provider's statements to them.   I have reviewed the above documentation for accuracy and completeness, and I agree with the above.

## 2017-11-16 ENCOUNTER — Inpatient Hospital Stay: Payer: PPO | Attending: Oncology

## 2017-11-16 ENCOUNTER — Telehealth: Payer: Self-pay | Admitting: Oncology

## 2017-11-16 ENCOUNTER — Inpatient Hospital Stay (HOSPITAL_BASED_OUTPATIENT_CLINIC_OR_DEPARTMENT_OTHER): Payer: PPO | Admitting: Oncology

## 2017-11-16 VITALS — BP 143/74 | HR 78 | Temp 98.5°F | Resp 20 | Ht 66.0 in | Wt 200.1 lb

## 2017-11-16 DIAGNOSIS — Z79810 Long term (current) use of selective estrogen receptor modulators (SERMs): Secondary | ICD-10-CM

## 2017-11-16 DIAGNOSIS — C50511 Malignant neoplasm of lower-outer quadrant of right female breast: Secondary | ICD-10-CM

## 2017-11-16 DIAGNOSIS — E041 Nontoxic single thyroid nodule: Secondary | ICD-10-CM | POA: Diagnosis not present

## 2017-11-16 DIAGNOSIS — Z170 Estrogen receptor positive status [ER+]: Secondary | ICD-10-CM

## 2017-11-16 LAB — COMPREHENSIVE METABOLIC PANEL
ALT: 22 U/L (ref 0–55)
AST: 25 U/L (ref 5–34)
Albumin: 3.5 g/dL (ref 3.5–5.0)
Alkaline Phosphatase: 98 U/L (ref 40–150)
Anion gap: 10 (ref 3–11)
BUN: 19 mg/dL (ref 7–26)
CO2: 23 mmol/L (ref 22–29)
CREATININE: 1.09 mg/dL (ref 0.60–1.10)
Calcium: 9.3 mg/dL (ref 8.4–10.4)
Chloride: 107 mmol/L (ref 98–109)
GFR, EST AFRICAN AMERICAN: 58 mL/min — AB (ref >60)
GFR, EST NON AFRICAN AMERICAN: 50 mL/min — AB (ref >60)
Glucose, Bld: 144 mg/dL — ABNORMAL HIGH (ref 70–140)
POTASSIUM: 3.9 mmol/L (ref 3.5–5.1)
SODIUM: 140 mmol/L (ref 136–145)
Total Bilirubin: 0.4 mg/dL (ref 0.2–1.2)
Total Protein: 6.6 g/dL (ref 6.4–8.3)

## 2017-11-16 LAB — CBC WITH DIFFERENTIAL/PLATELET
BASOS ABS: 0 10*3/uL (ref 0.0–0.1)
BASOS PCT: 1 %
EOS ABS: 0.1 10*3/uL (ref 0.0–0.5)
Eosinophils Relative: 2 %
HCT: 36 % (ref 34.8–46.6)
Hemoglobin: 11.9 g/dL (ref 11.6–15.9)
LYMPHS ABS: 1.3 10*3/uL (ref 0.9–3.3)
Lymphocytes Relative: 21 %
MCH: 31.5 pg (ref 25.1–34.0)
MCHC: 33 g/dL (ref 31.5–36.0)
MCV: 95.3 fL (ref 79.5–101.0)
MONOS PCT: 8 %
Monocytes Absolute: 0.5 10*3/uL (ref 0.1–0.9)
NEUTROS PCT: 68 %
Neutro Abs: 4.2 10*3/uL (ref 1.5–6.5)
PLATELETS: 152 10*3/uL (ref 145–400)
RBC: 3.78 MIL/uL (ref 3.70–5.45)
RDW: 13.3 % (ref 11.2–14.5)
WBC: 6.1 10*3/uL (ref 3.9–10.3)

## 2017-11-16 NOTE — Telephone Encounter (Signed)
Gave avs and calendar for February 2020 °

## 2017-11-23 DIAGNOSIS — I8393 Asymptomatic varicose veins of bilateral lower extremities: Secondary | ICD-10-CM | POA: Diagnosis not present

## 2017-11-23 DIAGNOSIS — L570 Actinic keratosis: Secondary | ICD-10-CM | POA: Diagnosis not present

## 2017-11-23 DIAGNOSIS — L814 Other melanin hyperpigmentation: Secondary | ICD-10-CM | POA: Diagnosis not present

## 2017-11-23 DIAGNOSIS — D229 Melanocytic nevi, unspecified: Secondary | ICD-10-CM | POA: Diagnosis not present

## 2017-11-23 DIAGNOSIS — L821 Other seborrheic keratosis: Secondary | ICD-10-CM | POA: Diagnosis not present

## 2017-11-23 DIAGNOSIS — I789 Disease of capillaries, unspecified: Secondary | ICD-10-CM | POA: Diagnosis not present

## 2017-11-24 ENCOUNTER — Other Ambulatory Visit: Payer: Self-pay

## 2017-11-24 DIAGNOSIS — C50511 Malignant neoplasm of lower-outer quadrant of right female breast: Secondary | ICD-10-CM

## 2017-11-24 DIAGNOSIS — Z170 Estrogen receptor positive status [ER+]: Principal | ICD-10-CM

## 2017-11-24 MED ORDER — TAMOXIFEN CITRATE 20 MG PO TABS: 20.0000 mg | ORAL_TABLET | Freq: Every day | ORAL | 3 refills | Status: DC

## 2017-11-24 NOTE — Telephone Encounter (Signed)
Pt called for refill for Tamoxifen.  Reviewed MD notes, as patient was recently seen by Dr Jana Hakim on 11/16/2017.  Refill completed.  Notified pt and no other needs per her today.

## 2017-12-23 ENCOUNTER — Other Ambulatory Visit: Payer: Self-pay | Admitting: Oncology

## 2017-12-23 DIAGNOSIS — Z1231 Encounter for screening mammogram for malignant neoplasm of breast: Secondary | ICD-10-CM

## 2018-01-13 ENCOUNTER — Other Ambulatory Visit: Payer: Self-pay | Admitting: Oncology

## 2018-01-13 DIAGNOSIS — Z853 Personal history of malignant neoplasm of breast: Secondary | ICD-10-CM

## 2018-01-18 ENCOUNTER — Ambulatory Visit
Admission: RE | Admit: 2018-01-18 | Discharge: 2018-01-18 | Disposition: A | Discharge status: Home / Self Care | Payer: PPO | Source: Ambulatory Visit | Attending: Oncology | Admitting: Oncology

## 2018-01-18 DIAGNOSIS — R928 Other abnormal and inconclusive findings on diagnostic imaging of breast: Secondary | ICD-10-CM | POA: Diagnosis not present

## 2018-01-18 DIAGNOSIS — Z853 Personal history of malignant neoplasm of breast: Secondary | ICD-10-CM

## 2018-02-12 ENCOUNTER — Telehealth: Payer: Self-pay | Admitting: Family Medicine

## 2018-02-12 NOTE — Telephone Encounter (Signed)
Left  Message for a return call

## 2018-03-09 NOTE — Telephone Encounter (Signed)
Called pt to schedule AWV and pt declines this apt this year.  -MM

## 2018-03-15 ENCOUNTER — Telehealth: Payer: Self-pay | Admitting: Family Medicine

## 2018-03-15 NOTE — Telephone Encounter (Signed)
Pt declined Annual Wellness Visit with [facility] msg'd [employer]

## 2018-04-22 DIAGNOSIS — K219 Gastro-esophageal reflux disease without esophagitis: Secondary | ICD-10-CM | POA: Diagnosis not present

## 2018-04-22 DIAGNOSIS — R1310 Dysphagia, unspecified: Secondary | ICD-10-CM | POA: Diagnosis not present

## 2018-05-11 ENCOUNTER — Encounter: Payer: Self-pay | Admitting: *Deleted

## 2018-05-12 ENCOUNTER — Ambulatory Visit: Payer: PPO | Admitting: Anesthesiology

## 2018-05-12 ENCOUNTER — Encounter
Admission: RE | Disposition: A | Discharge status: Home / Self Care | Payer: Self-pay | Source: Ambulatory Visit | Attending: Unknown Physician Specialty

## 2018-05-12 ENCOUNTER — Encounter: Payer: Self-pay | Admitting: *Deleted

## 2018-05-12 ENCOUNTER — Ambulatory Visit
Admission: RE | Admit: 2018-05-12 | Discharge: 2018-05-12 | Disposition: A | Discharge status: Home / Self Care | Payer: PPO | Source: Ambulatory Visit | Attending: Unknown Physician Specialty | Admitting: Unknown Physician Specialty

## 2018-05-12 DIAGNOSIS — Z853 Personal history of malignant neoplasm of breast: Secondary | ICD-10-CM | POA: Diagnosis not present

## 2018-05-12 DIAGNOSIS — K219 Gastro-esophageal reflux disease without esophagitis: Secondary | ICD-10-CM | POA: Diagnosis not present

## 2018-05-12 DIAGNOSIS — K317 Polyp of stomach and duodenum: Secondary | ICD-10-CM | POA: Insufficient documentation

## 2018-05-12 DIAGNOSIS — K222 Esophageal obstruction: Secondary | ICD-10-CM | POA: Insufficient documentation

## 2018-05-12 DIAGNOSIS — I10 Essential (primary) hypertension: Secondary | ICD-10-CM | POA: Diagnosis not present

## 2018-05-12 DIAGNOSIS — Z79899 Other long term (current) drug therapy: Secondary | ICD-10-CM | POA: Diagnosis not present

## 2018-05-12 DIAGNOSIS — K449 Diaphragmatic hernia without obstruction or gangrene: Secondary | ICD-10-CM | POA: Diagnosis not present

## 2018-05-12 DIAGNOSIS — R1310 Dysphagia, unspecified: Secondary | ICD-10-CM | POA: Diagnosis not present

## 2018-05-12 HISTORY — PX: ESOPHAGOGASTRODUODENOSCOPY (EGD) WITH PROPOFOL: SHX5813

## 2018-05-12 HISTORY — DX: Cardiomegaly: I51.7

## 2018-05-12 SURGERY — ESOPHAGOGASTRODUODENOSCOPY (EGD) WITH PROPOFOL
Anesthesia: General

## 2018-05-12 MED ORDER — LIDOCAINE HCL (PF) 2 % IJ SOLN
INTRAMUSCULAR | Status: DC | PRN
  Administered 2018-05-12: 80 mg

## 2018-05-12 MED ORDER — LIDOCAINE HCL (PF) 2 % IJ SOLN
INTRAMUSCULAR | Status: AC
  Filled 2018-05-12: qty 10

## 2018-05-12 MED ORDER — GLYCOPYRROLATE 0.2 MG/ML IJ SOLN
INTRAMUSCULAR | Status: DC | PRN
  Administered 2018-05-12: 0.2 mg via INTRAVENOUS

## 2018-05-12 MED ORDER — FENTANYL CITRATE (PF) 100 MCG/2ML IJ SOLN
INTRAMUSCULAR | Status: AC
  Filled 2018-05-12: qty 2

## 2018-05-12 MED ORDER — SODIUM CHLORIDE 0.9 % IV SOLN
INTRAVENOUS | Status: DC
  Administered 2018-05-12: 13:00:00 via INTRAVENOUS

## 2018-05-12 MED ORDER — PROPOFOL 10 MG/ML IV BOLUS
INTRAVENOUS | Status: DC | PRN
  Administered 2018-05-12: 20 mg via INTRAVENOUS

## 2018-05-12 MED ORDER — PROPOFOL 500 MG/50ML IV EMUL
INTRAVENOUS | Status: DC | PRN
  Administered 2018-05-12: 5 ug/kg/min via INTRAVENOUS

## 2018-05-12 MED ORDER — GLYCOPYRROLATE 0.2 MG/ML IJ SOLN
INTRAMUSCULAR | Status: AC
  Filled 2018-05-12: qty 1

## 2018-05-12 MED ORDER — MIDAZOLAM HCL 5 MG/5ML IJ SOLN
INTRAMUSCULAR | Status: DC | PRN
  Administered 2018-05-12: 2 mg via INTRAVENOUS

## 2018-05-12 MED ORDER — FENTANYL CITRATE (PF) 100 MCG/2ML IJ SOLN
INTRAMUSCULAR | Status: DC | PRN
  Administered 2018-05-12 (×2): 50 ug via INTRAVENOUS

## 2018-05-12 MED ORDER — MIDAZOLAM HCL 2 MG/2ML IJ SOLN
INTRAMUSCULAR | Status: AC
  Filled 2018-05-12: qty 2

## 2018-05-12 NOTE — H&P (Signed)
Primary Care Physician:  Birdie Sons, MD Primary Gastroenterologist:  Dr. Vira Agar  Pre-Procedure History & Physical: HPI:  Martha Evans is a 72 y.o. female is here for an endoscopy.  Done for dysphagia.   Past Medical History:  Diagnosis Date  . Allergy   . Cancer [facility])    breast cancer right breast  . Cardiomegaly   . Complication of anesthesia    was hard to intubate with thyroid surgery-99  . Difficult intubation 1999   when had thyroid lobectomy  . Dysrhythmia    irregular reuglar heart beat   . GERD (gastroesophageal reflux disease)   . History of chicken pox   . History of measles   . Hypertension   . Personal history of chemotherapy   . Personal history of radiation therapy   . Wears hearing aid    right    Past Surgical History:  Procedure Laterality Date  . ABDOMINAL HYSTERECTOMY  1999  . APPENDECTOMY     as child  . BREAST REDUCTION SURGERY Bilateral    1980's  . BREAST SURGERY Right 12/01/2013   ultrasound guided  . COLON RESECTION N/A 09/15/2014   Procedure: Laparoscopic hand assisted colostomy reversal with resection of left colon and laparoscopic lysis of adhesions ;  Surgeon: Alphonsa Overall, MD;  [facility];  Service: General;  Laterality: N/A;  . COLONOSCOPY    . COLONOSCOPY N/A 08/22/2014   Procedure: COLONOSCOPY;  Surgeon: Alphonsa Overall, MD;  [facility];  Service: General;  Laterality: N/A;  . LAPAROTOMY N/A 02/11/2014   Procedure: EXPLORATORY LAPAROTOMY, COLON RESECTION, COLOSTOMY;  Surgeon: Shann Medal, MD;  [facility];  Service: General;  Laterality: N/A;  . PARTIAL MASTECTOMY WITH NEEDLE LOCALIZATION AND AXILLARY SENTINEL LYMPH NODE BX Right 12/12/2013   Procedure: PARTIAL MASTECTOMY WITH NEEDLE LOCALIZATION AND AXILLARY SENTINEL LYMPH NODE BX;  Surgeon: Earnstine Regal, MD;  Location: [facility];  Service: General;  Laterality: Right;  . PORT-A-CATH REMOVAL Left 04/20/2015   Procedure: REMOVAL  PORT-A-CATH;  Surgeon: Armandina Gemma, MD;  Location: [facility];  Service: General;  Laterality: Left;  . PORTACATH PLACEMENT Left 01/02/2014   Procedure: INSERTION PORT-A-CATH;  Surgeon: Earnstine Regal, MD;  [facility];  Service: General;  Laterality: Left;  . ROTATOR CUFF REPAIR  2006   right shoulder  . THYROID LOBECTOMY  1999   right  . TONSILLECTOMY     as child    Prior to Admission medications   Medication Sig Start Date End Date Taking? Authorizing Provider  fluticasone (FLONASE) 50 MCG/ACT nasal spray Place 2 sprays into both nostrils daily. 07/14/17  Yes Mar Daring, PA-C  ibuprofen (ADVIL,MOTRIN) 800 MG tablet Take 1 tablet (800 mg total) by mouth every 8 (eight) hours as needed. 12/26/16  Yes Fenton Malling M, PA-C  loratadine (CLARITIN) 10 MG tablet Take 1 tablet by mouth daily. 06/30/13  Yes [provider]  omeprazole (PRILOSEC) 20 MG capsule Take 20 mg by mouth 2 (two) times daily before a meal.    Yes [provider]  tamoxifen (NOLVADEX) 20 MG tablet Take 1 tablet (20 mg total) by mouth daily. 11/24/17  Yes Magrinat, Virgie Dad, MD  telmisartan (MICARDIS) 40 MG tablet Take 40 mg by mouth at bedtime.    Yes [provider]    Allergies as of 04/29/2018 - Review Complete 07/09/2017  Allergen Reaction Noted  . Codeine sulfate Nausea And Vomiting 07/25/2013  .  Tramadol Nausea Only 01/16/2014    Family History  Problem Relation Age of Onset  . Cancer Father        bladder & pancreatic  . Alzheimer's disease Mother   . Diabetes Brother     Social History   Socioeconomic History  . Marital status: Married    Spouse name: Not on file  . Number of children: 0  . Years of education: Not on file  . Highest education level: Not on file  Occupational History  . Occupation: Retired  Scientific laboratory technician  . Financial resource strain: Not on file  . Food insecurity:    Worry: Not on file    Inability: Not on file  .  Transportation needs:    Medical: Not on file    Non-medical: Not on file  Tobacco Use  . Smoking status: Never Smoker  . Smokeless tobacco: Never Used  Substance and Sexual Activity  . Alcohol use: Yes    Comment: 1/monthly  . Drug use: No  . Sexual activity: Yes  Lifestyle  . Physical activity:    Days per week: Not on file    Minutes per session: Not on file  . Stress: Not on file  Relationships  . Social connections:    Talks on phone: Not on file    Gets together: Not on file    Attends religious service: Not on file    Active member of club or organization: Not on file    Attends meetings of clubs or organizations: Not on file    Relationship status: Not on file  . Intimate partner violence:    Fear of current or ex partner: Not on file    Emotionally abused: Not on file    Physically abused: Not on file    Forced sexual activity: Not on file  Other Topics Concern  . Not on file  Social History Narrative  . Not on file    Review of Systems: See HPI, otherwise negative ROS  Physical Exam: BP (!) 145/82   Pulse 75   Temp 98.1 F (36.7 C) (Oral)   Resp 18   Ht 5\' 6"  (1.676 m)   Wt 86.2 kg   SpO2 100%   BMI 30.67 kg/m  General:   Alert,  pleasant and cooperative in NAD Head:  Normocephalic and atraumatic. Neck:  Supple; no masses or thyromegaly. Lungs:  Clear throughout to auscultation.    Heart:  Regular rate and rhythm. Abdomen:  Soft, nontender and nondistended. Normal bowel sounds, without guarding, and without rebound.   Neurologic:  Alert and  oriented x4;  grossly normal neurologically.  Impression/Plan: Martha Evans is here for an endoscopy to be performed for dysphagia.  Risks, benefits, limitations, and alternatives regarding  endoscopy have been reviewed with the patient.  Questions have been answered.  All parties agreeable.   Gaylyn Cheers, MD  05/12/2018, 2:10 PM

## 2018-05-12 NOTE — Anesthesia Postprocedure Evaluation (Signed)
Anesthesia Post Note  Patient: Martha Evans  Procedure(s) Performed: ESOPHAGOGASTRODUODENOSCOPY (EGD) WITH PROPOFOL (N/A )  Patient location during evaluation: Endoscopy Anesthesia Type: General Level of consciousness: awake and alert Pain management: pain level controlled Vital Signs Assessment: post-procedure vital signs reviewed and stable Respiratory status: spontaneous breathing, nonlabored ventilation, respiratory function stable and patient connected to nasal cannula oxygen Cardiovascular status: blood pressure returned to baseline and stable Postop Assessment: no apparent nausea or vomiting Anesthetic complications: no     Last Vitals:  Vitals:   05/12/18 1448 05/12/18 1458  BP:  (!) 157/81  Pulse: 79 89  Resp: 19 (!) 23  Temp:    SpO2: 97% 97%    Last Pain:  Vitals:   05/12/18 1458  TempSrc:   PainSc: 0-No pain                 Martha Clan

## 2018-05-12 NOTE — Op Note (Signed)
[facility] Patient Name: Martha Evans Procedure Date: 05/12/2018 2:10 PM MRN: [id number] Account #: [id number] Date of Birth: 1945/10/20 Admit Type: Outpatient Age: 72 Room: [facility] ENDO ROOM 3 Gender: Female Note Status: Finalized Procedure:            Upper GI endoscopy Indications:          Dysphagia Providers:            Manya Silvas, MD Referring MD:         Kirstie Peri. Caryn Section, MD (Referring MD) Medicines:            Propofol per Anesthesia Complications:        No immediate complications. Procedure:            Pre-Anesthesia Assessment:                       - After reviewing the risks and benefits, the patient                        was deemed in satisfactory condition to undergo the                        procedure.                       After obtaining informed consent, the endoscope was                        passed under direct vision. Throughout the procedure,                        the patient's blood pressure, pulse, and oxygen                        saturations were monitored continuously. The Endoscope                        was introduced through the mouth, and advanced to the                        second part of duodenum. The upper GI endoscopy was                        accomplished without difficulty. The patient tolerated                        the procedure well. Findings:      A mild Schatzki ring was found at the gastroesophageal junction. At the       end of the procedure A TTS dilator was passed through the scope.       Dilation with a 15-16.5-18 mm balloon dilator was performed to 18 mm.      A small hiatal hernia was present.      A few diminutive sessile polyps with no bleeding and no stigmata of       recent bleeding were found in the gastric body. Otherwise neg.      The examined duodenum was normal. Impression:           - Mild Schatzki ring. Dilated.                       -  Small hiatal hernia.        - A few gastric polyps.                       - Normal examined duodenum.                       - No specimens collected. Recommendation:       - The findings and recommendations were discussed with                        the patient's family. Manya Silvas, MD 05/12/2018 2:27:55 PM This report has been signed electronically. Number of Addenda: 0 Note Initiated On: 05/12/2018 2:10 PM      [facility]

## 2018-05-12 NOTE — Transfer of Care (Signed)
Immediate Anesthesia Transfer of Care Note  Patient: Martha Evans  Procedure(s) Performed: ESOPHAGOGASTRODUODENOSCOPY (EGD) WITH PROPOFOL (N/A )  Patient Location: PACU  Anesthesia Type:General  Level of Consciousness: awake, alert  and oriented  Airway & Oxygen Therapy: Patient Spontanous Breathing and Patient connected to nasal cannula oxygen  Post-op Assessment: Report given to RN and Post -op Vital signs reviewed and stable  Post vital signs: Reviewed and stable  Last Vitals:  Vitals Value Taken Time  BP    Temp    Pulse 84 05/12/2018  2:28 PM  Resp 16 05/12/2018  2:28 PM  SpO2 96 % 05/12/2018  2:28 PM  Vitals shown include unvalidated device data.  Last Pain:  Vitals:   05/12/18 1251  TempSrc: Oral         Complications: No apparent anesthesia complications

## 2018-05-12 NOTE — Anesthesia Post-op Follow-up Note (Signed)
Anesthesia QCDR form completed.        

## 2018-05-12 NOTE — Anesthesia Preprocedure Evaluation (Signed)
Anesthesia Evaluation  Patient identified by MRN, date of birth, ID band Patient awake    Reviewed: Allergy & Precautions, H&P , NPO status , Patient's Chart, lab work & pertinent test results, reviewed documented beta blocker date and time   History of Anesthesia Complications (+) PONV, DIFFICULT AIRWAY and history of anesthetic complications  Airway Mallampati: IV  TM Distance: >3 FB Neck ROM: full  Mouth opening: Limited Mouth Opening  Dental  (+) Dental Advidsory Given, Teeth Intact Permanent bridge front top teeth:   Pulmonary neg pulmonary ROS,           Cardiovascular Exercise Tolerance: Good hypertension, (-) angina(-) CAD, (-) Past MI, (-) Cardiac Stents and (-) CABG + dysrhythmias (-) Valvular Problems/Murmurs     Neuro/Psych negative neurological ROS  negative psych ROS   GI/Hepatic Neg liver ROS, GERD  ,  Endo/Other  negative endocrine ROS  Renal/GU negative Renal ROS  negative genitourinary   Musculoskeletal   Abdominal   Peds  Hematology negative hematology ROS (+)   Anesthesia Other Findings Past Medical History: No date: Allergy No date: Cancer [facility])     Comment:  breast cancer right breast No date: Cardiomegaly No date: Complication of anesthesia     Comment:  was hard to intubate with thyroid surgery-99 1999: Difficult intubation     Comment:  when had thyroid lobectomy No date: Dysrhythmia     Comment:  irregular reuglar heart beat  No date: GERD (gastroesophageal reflux disease) No date: History of chicken pox No date: History of measles No date: Hypertension No date: Personal history of chemotherapy No date: Personal history of radiation therapy No date: Wears hearing aid     Comment:  right   Reproductive/Obstetrics negative OB ROS                             Anesthesia Physical Anesthesia Plan  ASA: II  Anesthesia Plan: General   Post-op Pain  Management:    Induction: Intravenous  PONV Risk Score and Plan: 4 or greater and Propofol infusion and TIVA  Airway Management Planned: Nasal Cannula and Natural Airway  Additional Equipment:   Intra-op Plan:   Post-operative Plan:   Informed Consent: I have reviewed the patients History and Physical, chart, labs and discussed the procedure including the risks, benefits and alternatives for the proposed anesthesia with the patient or authorized representative who has indicated his/her understanding and acceptance.   Dental Advisory Given  Plan Discussed with: Anesthesiologist, CRNA and Surgeon  Anesthesia Plan Comments:         Anesthesia Quick Evaluation

## 2018-05-17 ENCOUNTER — Encounter: Payer: Self-pay | Admitting: Unknown Physician Specialty

## 2018-06-01 DIAGNOSIS — K219 Gastro-esophageal reflux disease without esophagitis: Secondary | ICD-10-CM | POA: Diagnosis not present

## 2018-06-01 DIAGNOSIS — E7800 Pure hypercholesterolemia, unspecified: Secondary | ICD-10-CM | POA: Diagnosis not present

## 2018-06-01 DIAGNOSIS — C50911 Malignant neoplasm of unspecified site of right female breast: Secondary | ICD-10-CM | POA: Diagnosis not present

## 2018-06-01 DIAGNOSIS — R0602 Shortness of breath: Secondary | ICD-10-CM | POA: Diagnosis not present

## 2018-06-01 DIAGNOSIS — I10 Essential (primary) hypertension: Secondary | ICD-10-CM | POA: Diagnosis not present

## 2018-06-01 DIAGNOSIS — I517 Cardiomegaly: Secondary | ICD-10-CM | POA: Diagnosis not present

## 2018-06-21 DIAGNOSIS — R0602 Shortness of breath: Secondary | ICD-10-CM | POA: Diagnosis not present

## 2018-06-23 DIAGNOSIS — I10 Essential (primary) hypertension: Secondary | ICD-10-CM | POA: Diagnosis not present

## 2018-06-23 DIAGNOSIS — K219 Gastro-esophageal reflux disease without esophagitis: Secondary | ICD-10-CM | POA: Diagnosis not present

## 2018-06-23 DIAGNOSIS — E7800 Pure hypercholesterolemia, unspecified: Secondary | ICD-10-CM | POA: Diagnosis not present

## 2018-06-23 DIAGNOSIS — I517 Cardiomegaly: Secondary | ICD-10-CM | POA: Diagnosis not present

## 2018-07-22 ENCOUNTER — Telehealth: Payer: Self-pay | Admitting: General Practice

## 2018-07-22 NOTE — Telephone Encounter (Signed)
[place] CSW Progress Notes  PAtient had called CSW Alice requesting help w Advance Directives - available on Weds, Thurs and Friday.  Undersigned CSW left patient VM encouraging her to call back and schedule visit for this purpose.  Edwyna Shell, LCSW Clinical Social Worker Phone:  [phone]

## 2018-07-30 ENCOUNTER — Encounter: Payer: Self-pay | Admitting: General Practice

## 2018-07-30 NOTE — Progress Notes (Signed)
[place] Social Work  Clinical Social Work was referred by patient  to review and complete healthcare advance directives.  Clinical Social Worker met with patient and husband in [place] office.  The patient designated Lendon Collar as their primary healthcare agent and no one as their secondary agent.  Patient also completed healthcare living will.    Clinical Social Worker notarized documents and made copies for patient/family. Clinical Social Worker will send documents to medical records to be scanned into patient's chart. Clinical Social Worker encouraged patient/family to contact with any additional questions or concerns.  Edwyna Shell, LCSW Clinical Social Worker Phone:  [phone]

## 2018-08-03 ENCOUNTER — Other Ambulatory Visit: Payer: Self-pay

## 2018-08-03 ENCOUNTER — Ambulatory Visit (INDEPENDENT_AMBULATORY_CARE_PROVIDER_SITE_OTHER): Payer: PPO | Admitting: Family Medicine

## 2018-08-03 ENCOUNTER — Encounter: Payer: Self-pay | Admitting: Family Medicine

## 2018-08-03 VITALS — BP 138/80 | HR 89 | Temp 98.2°F | Ht 66.0 in | Wt 195.6 lb

## 2018-08-03 DIAGNOSIS — N390 Urinary tract infection, site not specified: Secondary | ICD-10-CM

## 2018-08-03 LAB — POCT URINALYSIS DIPSTICK
Bilirubin, UA: NEGATIVE
GLUCOSE UA: NEGATIVE
Ketones, UA: NEGATIVE
NITRITE UA: POSITIVE
PROTEIN UA: NEGATIVE
SPEC GRAV UA: 1.01 (ref 1.010–1.025)
Urobilinogen, UA: 0.2 E.U./dL
pH, UA: 6 (ref 5.0–8.0)

## 2018-08-03 MED ORDER — SULFAMETHOXAZOLE-TRIMETHOPRIM 800-160 MG PO TABS: 1.0000 | ORAL_TABLET | Freq: Two times a day (BID) | ORAL | 0 refills | Status: DC

## 2018-08-03 NOTE — Patient Instructions (Signed)

## 2018-08-03 NOTE — Progress Notes (Signed)
Patient: Martha Evans Female    DOB: [birth year]   72 y.o.   MRN: [id number] Visit Date: 08/03/2018  Today's Provider: Vernie Murders, PA   Chief Complaint  Patient presents with  . Back Pain    lower left side, fever, chills, frequency, vaginal discharge   Subjective:    HPI  Pt reports she was having chills last night and today she had a fever of 100.0.  Pt states that when she urinates she has a twinge feeling, a strong odor to her urine and is having lower back pain on the left side.  All symptoms started last night 08/02/18 but she states she hasn't felt well for 2 - 3 weeks now.    Past Medical History:  Diagnosis Date  . Allergy   . Cancer [facility])    breast cancer right breast  . Cardiomegaly   . Complication of anesthesia    was hard to intubate with thyroid surgery-99  . Difficult intubation 1999   when had thyroid lobectomy  . Dysrhythmia    irregular reuglar heart beat   . GERD (gastroesophageal reflux disease)   . History of chicken pox   . History of measles   . Hypertension   . Personal history of chemotherapy   . Personal history of radiation therapy   . Wears hearing aid    right   Past Surgical History:  Procedure Laterality Date  . ABDOMINAL HYSTERECTOMY  1999  . APPENDECTOMY     as child  . BREAST REDUCTION SURGERY Bilateral    1980's  . BREAST SURGERY Right 12/01/2013   ultrasound guided  . COLON RESECTION N/A 09/15/2014   Procedure: Laparoscopic hand assisted colostomy reversal with resection of left colon and laparoscopic lysis of adhesions ;  Surgeon: Alphonsa Overall, MD;  Location: [facility];  Service: General;  Laterality: N/A;  . COLONOSCOPY    . COLONOSCOPY N/A 08/22/2014   Procedure: COLONOSCOPY;  Surgeon: Alphonsa Overall, MD;  Location: [facility];  Service: General;  Laterality: N/A;  . ESOPHAGOGASTRODUODENOSCOPY (EGD) WITH PROPOFOL N/A 05/12/2018   Procedure: ESOPHAGOGASTRODUODENOSCOPY (EGD) WITH PROPOFOL;  Surgeon: Manya Silvas,  MD;  Location: [facility] END[facility]COPY;  Service: Endoscopy;  Laterality: N/A;  . LAPAROTOMY N/A 02/11/2014   Procedure: EXPLORATORY LAPAROTOMY, COLON RESECTION, COLOSTOMY;  Surgeon: Shann Medal, MD;  Location: [facility];  Service: General;  Laterality: N/A;  . PARTIAL MASTECTOMY WITH NEEDLE LOCALIZATION AND AXILLARY SENTINEL LYMPH NODE BX Right 12/12/2013   Procedure: PARTIAL MASTECTOMY WITH NEEDLE LOCALIZATION AND AXILLARY SENTINEL LYMPH NODE BX;  Surgeon: Earnstine Regal, MD;  Location: [place];  Service: General;  Laterality: Right;  . PORT-A-CATH REMOVAL Left 04/20/2015   Procedure: REMOVAL PORT-A-CATH;  Surgeon: Armandina Gemma, MD;  Location: [place];  Service: General;  Laterality: Left;  . PORTACATH PLACEMENT Left 01/02/2014   Procedure: INSERTION PORT-A-CATH;  Surgeon: Earnstine Regal, MD;  [facility];  Service: General;  Laterality: Left;  . ROTATOR CUFF REPAIR  2006   right shoulder  . THYROID LOBECTOMY  1999   right  . TONSILLECTOMY     as child   Family History  Problem Relation Age of Onset  . Cancer Father        bladder & pancreatic  . Alzheimer's disease Mother   . Diabetes Brother    Allergies  Allergen Reactions  . Codeine Sulfate Nausea And Vomiting  . Tramadol Nausea Only  Nausea     Current Outpatient Medications:  .  fluticasone (FLONASE) 50 MCG/ACT nasal spray, Place 2 sprays into both nostrils daily., Disp: 16 g, Rfl: 6 .  ibuprofen (ADVIL,MOTRIN) 800 MG tablet, Take 1 tablet (800 mg total) by mouth every 8 (eight) hours as needed., Disp: 90 tablet, Rfl: 0 .  loratadine (CLARITIN) 10 MG tablet, Take 1 tablet by mouth daily., Disp: , Rfl:  .  omeprazole (PRILOSEC) 20 MG capsule, Take 20 mg by mouth 2 (two) times daily before a meal. , Disp: , Rfl:  .  tamoxifen (NOLVADEX) 20 MG tablet, Take 1 tablet (20 mg total) by mouth daily., Disp: 90 tablet, Rfl: 3 .  telmisartan (MICARDIS) 40 MG tablet, Take 40 mg by mouth at bedtime. , Disp: ,  Rfl:   Review of Systems  Constitutional: Positive for chills and fever. Negative for activity change, appetite change, diaphoresis, fatigue and unexpected weight change.  HENT: Negative.   Eyes: Negative.   Respiratory: Negative.   Cardiovascular: Negative.   Gastrointestinal: Negative.   Endocrine: Negative.   Genitourinary: Positive for frequency and vaginal discharge (says she has because of the tomoxifen). Negative for decreased urine volume, difficulty urinating, dyspareunia, dysuria, enuresis, flank pain, genital sores, hematuria, menstrual problem, pelvic pain, urgency, vaginal bleeding and vaginal pain.  Musculoskeletal: Positive for back pain (lower left side). Negative for arthralgias, gait problem, joint swelling, myalgias, neck pain and neck stiffness.  Skin: Negative.   Allergic/Immunologic: Negative.   Neurological: Negative.   Hematological: Negative.   Psychiatric/Behavioral: Negative.    Social History   Tobacco Use  . Smoking status: Never Smoker  . Smokeless tobacco: Never Used  Substance Use Topics  . Alcohol use: Yes    Comment: 1/monthly   Objective:   BP 138/80 (BP Location: Left Arm, Patient Position: Sitting, Cuff Size: Normal)   Pulse 89   Temp 98.2 F (36.8 C) (Oral)   Ht 5\' 6"  (1.676 m)   Wt 195 lb 9.6 oz (88.7 kg)   SpO2 98%   BMI 31.57 kg/m  Vitals:   08/03/18 1517  BP: 138/80  Pulse: 89  Temp: 98.2 F (36.8 C)  TempSrc: Oral  SpO2: 98%  Weight: 195 lb 9.6 oz (88.7 kg)  Height: 5\' 6"  (1.676 m)   Physical Exam  Constitutional: She is oriented to person, place, and time. She appears well-developed and well-nourished. No distress.  HENT:  Head: Normocephalic and atraumatic.  Right Ear: Hearing normal.  Left Ear: Hearing normal.  Nose: Nose normal.  Eyes: Conjunctivae and lids are normal. Right eye exhibits no discharge. Left eye exhibits no discharge. No scleral icterus.  Neck: Neck supple.  Cardiovascular: Normal rate and regular  rhythm.  Pulmonary/Chest: Effort normal. No respiratory distress.  Abdominal: Soft. Bowel sounds are normal.  Musculoskeletal: Normal range of motion.  Neurological: She is alert and oriented to person, place, and time.  Skin: Skin is intact. No lesion and no rash noted.  Psychiatric: She has a normal mood and affect. Her speech is normal and behavior is normal. Thought content normal.      Assessment & Plan:     1. Urinary tract infection without hematuria, site unspecified Developed low back discomfort, fever and urinary frequency with foul odor to urine over the past 24 hours. No nausea, vomiting or hematuria. Minimal bladder tenderness. Urinalysis positive for nitrites and leukocytes. Will get urine C&S and start Septra-DS. Increase fluid intake and may use AZO-Standard prn dysuria. Recheck pending culture  report. - POCT Urinalysis Dipstick - Urine Culture - sulfamethoxazole-trimethoprim (BACTRIM DS,SEPTRA DS) 800-160 MG tablet; Take 1 tablet by mouth 2 (two) times daily.  Dispense: 14 tablet; Refill: [place], PA  [facility]

## 2018-08-06 ENCOUNTER — Other Ambulatory Visit: Payer: Self-pay | Admitting: Family Medicine

## 2018-08-06 LAB — URINE CULTURE

## 2018-08-06 MED ORDER — CIPROFLOXACIN HCL 250 MG PO TABS: 250.0000 mg | ORAL_TABLET | Freq: Two times a day (BID) | ORAL | 0 refills | Status: DC

## 2018-08-06 NOTE — Progress Notes (Signed)
Sent prescription to the [facility].

## 2018-08-13 ENCOUNTER — Encounter: Payer: Self-pay | Admitting: Family Medicine

## 2018-08-13 ENCOUNTER — Ambulatory Visit (INDEPENDENT_AMBULATORY_CARE_PROVIDER_SITE_OTHER): Payer: PPO | Admitting: Family Medicine

## 2018-08-13 ENCOUNTER — Other Ambulatory Visit: Payer: Self-pay

## 2018-08-13 VITALS — BP 128/74 | HR 79 | Temp 97.6°F | Ht 66.0 in | Wt 196.0 lb

## 2018-08-13 DIAGNOSIS — N390 Urinary tract infection, site not specified: Secondary | ICD-10-CM | POA: Diagnosis not present

## 2018-08-13 LAB — POCT URINALYSIS DIPSTICK
Bilirubin, UA: NEGATIVE
Blood, UA: NEGATIVE
GLUCOSE UA: NEGATIVE
Ketones, UA: NEGATIVE
LEUKOCYTES UA: NEGATIVE
NITRITE UA: NEGATIVE
PROTEIN UA: NEGATIVE
SPEC GRAV UA: 1.02 (ref 1.010–1.025)
Urobilinogen, UA: 0.2 E.U./dL
pH, UA: 5 (ref 5.0–8.0)

## 2018-08-13 NOTE — Progress Notes (Signed)
Patient: Martha Evans Female    DOB: 12/27/1945   72 y.o.   MRN: [id number] Visit Date: 08/13/2018  Today's Provider: Vernie Murders, PA   Chief Complaint  Patient presents with  . Follow-up    UTI   Subjective:    HPI  Pt reports she is being seen to f-up on UTI and pt states she just finished the cipro today 08/13/18 and she is feeling much better. No further UTI symptoms of dysuria or foul odor to urine. No side effects from antibiotics and urine culture isolated E.coli on 08-03-18 with resistance to the Septra, only. No hematuria or urinary frequency now.     Patient Active Problem List   Diagnosis Date Noted  . Allergic rhinitis 10/11/2015  . Colon polyp 10/11/2015  . 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 Vitamin D deficiency 10/11/2015  . Cardiac enlargement 05/24/2015  . Acid reflux 05/24/2015  . Hypercholesteremia 05/24/2015  . BP (high blood pressure) 05/24/2015  . Hot flashes 11/22/2014  . Arthralgia 11/22/2014  . S/P colostomy takedown 09/15/14 09/15/2014  . History of diverticular abscess of colon 07/19/2014  . 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 ARF (acute renal failure) ([place]) 02/17/2014  . Malignant neoplasm of lower-outer quadrant of right breast of female, estrogen receptor positive ([place]) 12/07/2013  . Multiple thyroid nodules, left lobe 07/25/2013  . History of thyroid lobectomy, right 07/25/2013  . Herpes zoster without complication 03/47/4259   Past Surgical History:  Procedure Laterality Date  . ABDOMINAL HYSTERECTOMY  1999  . APPENDECTOMY     as child  . BREAST REDUCTION SURGERY Bilateral    1980's  . BREAST SURGERY Right 12/01/2013   ultrasound guided  . COLON RESECTION N/A 09/15/2014   Procedure: Laparoscopic hand assisted colostomy reversal with resection of left colon and laparoscopic lysis of adhesions ;  Surgeon: Alphonsa Overall, MD;  [facility];  Service: General;  Laterality: N/A;  . COLONOSCOPY    . COLONOSCOPY N/A  08/22/2014   Procedure: COLONOSCOPY;  Surgeon: Alphonsa Overall, MD;  Location: [facility];  Service: General;  Laterality: N/A;  . ESOPHAGOGASTRODUODENOSCOPY (EGD) WITH PROPOFOL N/A 05/12/2018   Procedure: ESOPHAGOGASTRODUODENOSCOPY (EGD) WITH PROPOFOL;  Surgeon: Manya Silvas, MD;  Location: [facility] ENDOSCOPY;  Service: Endoscopy;  Laterality: N/A;  . LAPAROTOMY N/A 02/11/2014   Procedure: EXPLORATORY LAPAROTOMY, COLON RESECTION, COLOSTOMY;  Surgeon: Shann Medal, MD;  [facility];  Service: General;  Laterality: N/A;  . PARTIAL MASTECTOMY WITH NEEDLE LOCALIZATION AND AXILLARY SENTINEL LYMPH NODE BX Right 12/12/2013   Procedure: PARTIAL MASTECTOMY WITH NEEDLE LOCALIZATION AND AXILLARY SENTINEL LYMPH NODE BX;  Surgeon: Earnstine Regal, MD;  Location: [place];  Service: General;  Laterality: Right;  . PORT-A-CATH REMOVAL Left 04/20/2015   Procedure: REMOVAL PORT-A-CATH;  Surgeon: Armandina Gemma, MD;  Location: [place];  Service: General;  Laterality: Left;  . PORTACATH PLACEMENT Left 01/02/2014   Procedure: INSERTION PORT-A-CATH;  Surgeon: Earnstine Regal, MD;  [facility];  Service: General;  Laterality: Left;  . ROTATOR CUFF REPAIR  2006   right shoulder  . THYROID LOBECTOMY  1999   right  . TONSILLECTOMY     as child   Family History  Problem Relation Age of Onset  . Cancer Father        bladder & pancreatic  . Alzheimer's disease Mother   . Diabetes Brother    Past Medical History:  Diagnosis Date  .  Allergy   . Cancer [facility])    breast cancer right breast  . Cardiomegaly   . Complication of anesthesia    was hard to intubate with thyroid surgery-99  . Difficult intubation 1999   when had thyroid lobectomy  . Dysrhythmia    irregular reuglar heart beat   . GERD (gastroesophageal reflux disease)   . History of chicken pox   . History of measles   . Hypertension   . Personal history of chemotherapy   . Personal history of radiation therapy    . Wears hearing aid    right   Allergies  Allergen Reactions  . Codeine Sulfate Nausea And Vomiting  . Tramadol Nausea Only    Nausea     Current Outpatient Medications:  .  fluticasone (FLONASE) 50 MCG/ACT nasal spray, Place 2 sprays into both nostrils daily., Disp: 16 g, Rfl: 6 .  ibuprofen (ADVIL,MOTRIN) 800 MG tablet, Take 1 tablet (800 mg total) by mouth every 8 (eight) hours as needed., Disp: 90 tablet, Rfl: 0 .  loratadine (CLARITIN) 10 MG tablet, Take 1 tablet by mouth daily., Disp: , Rfl:  .  omeprazole (PRILOSEC) 20 MG capsule, Take 20 mg by mouth 2 (two) times daily before a meal. , Disp: , Rfl:  .  tamoxifen (NOLVADEX) 20 MG tablet, Take 1 tablet (20 mg total) by mouth daily., Disp: 90 tablet, Rfl: 3 .  telmisartan (MICARDIS) 40 MG tablet, Take 40 mg by mouth at bedtime. , Disp: , Rfl:  .  ciprofloxacin (CIPRO) 250 MG tablet, Take 1 tablet (250 mg total) by mouth 2 (two) times daily. (Patient not taking: Reported on 08/13/2018), Disp: 14 tablet, Rfl: 0  Review of Systems  Constitutional: Negative.   HENT: Negative.   Eyes: Negative.   Respiratory: Negative.   Cardiovascular: Negative.   Gastrointestinal: Negative.   Endocrine: Negative.   Genitourinary: Negative.   Musculoskeletal: Negative.   Skin: Negative.   Allergic/Immunologic: Negative.   Neurological: Negative.   Hematological: Negative.   Psychiatric/Behavioral: Negative.    Social History   Tobacco Use  . Smoking status: Never Smoker  . Smokeless tobacco: Never Used  Substance Use Topics  . Alcohol use: Yes    Comment: 1/monthly   Objective:   BP 128/74 (BP Location: Left Arm, Patient Position: Sitting, Cuff Size: Normal)   Pulse 79   Temp 97.6 F (36.4 C) (Oral)   Ht 5\' 6"  (1.676 m)   Wt 196 lb (88.9 kg)   SpO2 96%   BMI 31.64 kg/m  Vitals:   08/13/18 1024  BP: 128/74  Pulse: 79  Temp: 97.6 F (36.4 C)  TempSrc: Oral  SpO2: 96%  Weight: 196 lb (88.9 kg)  Height: 5\' 6"  (1.676 m)    Physical Exam  Constitutional: She is oriented to person, place, and time. She appears well-developed and well-nourished. No distress.  HENT:  Head: Normocephalic and atraumatic.  Right Ear: Hearing normal.  Left Ear: Hearing normal.  Nose: Nose normal.  Eyes: Conjunctivae and lids are normal. Right eye exhibits no discharge. Left eye exhibits no discharge. No scleral icterus.  Cardiovascular: Normal rate and regular rhythm.  Pulmonary/Chest: Effort normal and breath sounds normal. No respiratory distress.  Musculoskeletal: Normal range of motion.  Neurological: She is alert and oriented to person, place, and time.  Skin: Skin is intact. No lesion and no rash noted.  Psychiatric: She has a normal mood and affect. Her speech is normal and behavior is normal. Thought  content normal.      Assessment & Plan:     1. Urinary tract infection without hematuria, site unspecified Culture on 08-03-18 isolated E.coli that was sensitive to every antibiotic tested except Septra. Finished the Cipro this morning and no longer having any foul odor to urine or back and bladder discomfort. Urinalysis normal today and recommend she drink plenty of water with diet. Recheck prn. - POCT Urinalysis Dipstick       Vernie Murders, PA  [facility]

## 2018-10-05 ENCOUNTER — Telehealth: Payer: Self-pay | Admitting: Oncology

## 2018-10-05 NOTE — Telephone Encounter (Signed)
Patient called to reschedule  °

## 2018-10-22 NOTE — Progress Notes (Signed)
[place]  [phone] Fax:([phone]     ID: Martha Evans DOB: 31-Mar-1946  MR#: [id number]  MBW#:[id number]  Patient Care Team: Birdie Sons, MD as PCP - General (Family Medicine) Ubaldo Glassing Javier Docker, MD as Consulting Physician (Cardiology) Armandina Gemma, MD as Consulting Physician (General Surgery) Magrinat, Virgie Dad, MD as Consulting Physician (Oncology) Noreene Filbert, MD as Consulting Physician Alphonsa Overall, MD as Consulting Physician (General Surgery)   CHIEF COMPLAINT: Metaplastic breast cancer  CURRENT TREATMENT: Completing 4 years of tamoxifen   BREAST CANCER HISTORY: From a Dr. Laurelyn Sickle initial intake note 12/21/2013:  "Martha Evans is a 73 y.o. female. Would medical history significant for hypertension gastroesophageal reflux disease. Patient underwent bilateral breast reductions in the 1980s. In February 2015 she had screening mammograms performed that showed a mass in the central right breast. She had ultrasound performed which revealed a 6 x 4 x 6 mm high-grade calcifications. Biopsy of these calcifications revealed invasive ductal carcinoma. On 12/12/2013 patient underwent a lumpectomy without sentinel lymph node biopsy. 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 Tumor was ER +2% PR +2% HER-2/neu negative with an elevated Ki-67 of 84%. Of note patient [could not] undergo a sentinel lymph node biopsy because of previous breast reduction."  The patient was started on adjuvant chemotherapy with cyclophosphamide and docetaxel 01/16/2014, but treatment was interrupted by a perforated diverticulum requiring partial colectomy 02/13/2014.   Her subsequent history is as detailed below   INTERVAL HISTORY: Martha Evans returns today for follow-up and treatment of her breast cancer which was very minimally estrogen and progesterone receptor positive.   She continues on tamoxifen. She tolerates this well and  without any noticeable side effects.    Her last digital bilateral diagnostic mammography was on 01/18/2018 at [facility] showing Breast Density Category B. There is no mammographic evidence for malignancy.   Since her last visit here, she has not undergone any additional studies.     REVIEW OF SYSTEMS: Martha Evans has been going to [facility] a lot recently. For exercise, she walks two or three days a week; her hip pain is resolved with walking. The patient denies unusual headaches, visual changes, nausea, vomiting, or dizziness. There has been no unusual cough, phlegm production, or pleurisy. This been no change in bowel or bladder habits. The patient denies unexplained fatigue or unexplained weight loss, bleeding, rash, or fever. A detailed review of systems was otherwise noncontributory.    PAST MEDICAL HISTORY: Past Medical History:  Diagnosis Date  . Allergy   . Cancer [facility])    breast cancer right breast  . Cardiomegaly   . Complication of anesthesia    was hard to intubate with thyroid surgery-99  . Difficult intubation 1999   when had thyroid lobectomy  . Dysrhythmia    irregular reuglar heart beat   . GERD (gastroesophageal reflux disease)   . History of chicken pox   . History of measles   . Hypertension   . Personal history of chemotherapy   . Personal history of radiation therapy   . Wears hearing aid    right    PAST SURGICAL HISTORY: Past Surgical History:  Procedure Laterality Date  . ABDOMINAL HYSTERECTOMY  1999  . APPENDECTOMY     as child  . BREAST REDUCTION SURGERY Bilateral    1980's  . BREAST SURGERY Right 12/01/2013   ultrasound guided  . COLON RESECTION N/A 09/15/2014   Procedure: Laparoscopic hand  assisted colostomy reversal with resection of left colon and laparoscopic lysis of adhesions ;  Surgeon: Alphonsa Overall, MD;  [facility];  Service: General;  Laterality: N/A;  . COLONOSCOPY    . COLONOSCOPY N/A 08/22/2014   Procedure:  COLONOSCOPY;  Surgeon: Alphonsa Overall, MD;  Location: [facility];  Service: General;  Laterality: N/A;  . ESOPHAGOGASTRODUODENOSCOPY (EGD) WITH PROPOFOL N/A 05/12/2018   Procedure: ESOPHAGOGASTRODUODENOSCOPY (EGD) WITH PROPOFOL;  Surgeon: Manya Silvas, MD;  Location: [facility] ENDOSCOPY;  Service: Endoscopy;  Laterality: N/A;  . LAPAROTOMY N/A 02/11/2014   Procedure: EXPLORATORY LAPAROTOMY, COLON RESECTION, COLOSTOMY;  Surgeon: Shann Medal, MD;  [facility];  Service: General;  Laterality: N/A;  . PARTIAL MASTECTOMY WITH NEEDLE LOCALIZATION AND AXILLARY SENTINEL LYMPH NODE BX Right 12/12/2013   Procedure: PARTIAL MASTECTOMY WITH NEEDLE LOCALIZATION AND AXILLARY SENTINEL LYMPH NODE BX;  Surgeon: Earnstine Regal, MD;  Location: [facility];  Service: General;  Laterality: Right;  . PORT-A-CATH REMOVAL Left 04/20/2015   Procedure: REMOVAL PORT-A-CATH;  Surgeon: Armandina Gemma, MD;  Location: [facility];  Service: General;  Laterality: Left;  . PORTACATH PLACEMENT Left 01/02/2014   Procedure: INSERTION PORT-A-CATH;  Surgeon: Earnstine Regal, MD;  [facility];  Service: General;  Laterality: Left;  . ROTATOR CUFF REPAIR  2006   right shoulder  . THYROID LOBECTOMY  1999   right  . TONSILLECTOMY     as child    FAMILY HISTORY Family History  Problem Relation Age of Onset  . Cancer Father        bladder & pancreatic  . Alzheimer's disease Mother   . Diabetes Brother   The patient's father died from pancreatic cancer the age of 68. The patient's mother has significant Alzheimer's disease. The patient had one brother, no sisters. There are no first-degree relatives with breast or ovarian cancer.  GYNECOLOGIC HISTORY:  No LMP recorded. Patient has had a hysterectomy. Menarche age 81. The patient is GX P0. She underwent hysterectomy with bilateral salpingo-oophorectomy in 1999. She used an estrogen patch for several years after that.   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 Her husband, Lendon Collar, works as a Armed forces logistics/support/administrative officer. He has 3 children from a prior marriage, living in [place], [place]. Martha Evans attends a Clinical research associate.    HEALTH MAINTENANCE: Social History   Tobacco Use  . Smoking status: Never Smoker  . Smokeless tobacco: Never Used  Substance Use Topics  . Alcohol use: Yes    Comment: 1/monthly  . Drug use: No     Allergies  Allergen Reactions  . Codeine Sulfate Nausea And Vomiting  . Tramadol Nausea Only    Nausea     Current Outpatient Medications  Medication Sig Dispense Refill  . ibuprofen (ADVIL,MOTRIN) 800 MG tablet Take 1 tablet (800 mg total) by mouth every 8 (eight) hours as needed. 90 tablet 0  . loratadine (CLARITIN) 10 MG tablet Take 1 tablet by mouth daily.    Marland Kitchen omeprazole (PRILOSEC) 20 MG capsule Take 20 mg by mouth 2 (two) times daily before a meal.     . telmisartan (MICARDIS) 40 MG tablet Take 40 mg by mouth at bedtime.      No current facility-administered medications for this visit.  OBJECTIVE: Middle-aged white woman who appears well  Vitals:   10/25/18 1003  BP: 127/86  Pulse: 68  Resp: 18  Temp: 97.7 F (36.5 C)  SpO2: 98%     Body mass index is 31.65 kg/m.    ECOG FS:0 - Asymptomatic  Sclerae unicteric, pupils round and equal No cervical or supraclavicular adenopathy Lungs no rales or rhonchi Heart regular rate and rhythm Abd soft, nontender, positive bowel sounds MSK no focal spinal tenderness, no upper extremity lymphedema Neuro: nonfocal, well oriented, appropriate affect Breasts: The right breast is status post lumpectomy and radiation, with no evidence of local recurrence.  The left breast is unremarkable.  Both axillae are benign.  LAB RESULTS:  CMP     Component  Value Date/Time   NA 140 11/16/2017 1244   NA 143 01/26/2017 1351   K 3.9 11/16/2017 1244   K 4.0 01/26/2017 1351   CL 107 11/16/2017 1244   CO2 23 11/16/2017 1244   CO2 24 01/26/2017 1351   GLUCOSE 144 (H) 11/16/2017 1244   GLUCOSE 153 (H) 01/26/2017 1351   BUN 19 11/16/2017 1244   BUN 26.1 (H) 01/26/2017 1351   CREATININE 1.09 11/16/2017 1244   CREATININE 1.1 01/26/2017 1351   CALCIUM 9.3 11/16/2017 1244   CALCIUM 9.0 01/26/2017 1351   PROT 6.6 11/16/2017 1244   PROT 6.5 01/26/2017 1351   ALBUMIN 3.5 11/16/2017 1244   ALBUMIN 3.5 01/26/2017 1351   AST 25 11/16/2017 1244   AST 19 01/26/2017 1351   ALT 22 11/16/2017 1244   ALT 28 01/26/2017 1351   ALKPHOS 98 11/16/2017 1244   ALKPHOS 102 01/26/2017 1351   BILITOT 0.4 11/16/2017 1244   BILITOT 0.28 01/26/2017 1351   GFRNONAA 50 (L) 11/16/2017 1244   GFRAA 58 (L) 11/16/2017 1244    I No results found for: SPEP  Lab Results  Component Value Date   WBC 5.3 10/25/2018   NEUTROABS 3.1 10/25/2018   HGB 12.4 10/25/2018   HCT 38.4 10/25/2018   MCV 98.7 10/25/2018   PLT 129 (L) 10/25/2018      Chemistry      Component Value Date/Time   NA 140 11/16/2017 1244   NA 143 01/26/2017 1351   K 3.9 11/16/2017 1244   K 4.0 01/26/2017 1351   CL 107 11/16/2017 1244   CO2 23 11/16/2017 1244   CO2 24 01/26/2017 1351   BUN 19 11/16/2017 1244   BUN 26.1 (H) 01/26/2017 1351   CREATININE 1.09 11/16/2017 1244   CREATININE 1.1 01/26/2017 1351   GLU 94 12/31/2012      Component Value Date/Time   CALCIUM 9.3 11/16/2017 1244   CALCIUM 9.0 01/26/2017 1351   ALKPHOS 98 11/16/2017 1244   ALKPHOS 102 01/26/2017 1351   AST 25 11/16/2017 1244   AST 19 01/26/2017 1351   ALT 22 11/16/2017 1244   ALT 28 01/26/2017 1351   BILITOT 0.4 11/16/2017 1244   BILITOT 0.28 01/26/2017 1351       No results found for: LABCA2  No components found for: LABCA125  No results for input(s): INR in the last 168 hours.  Urinalysis    Component  Value Date/Time   COLORURINE YELLOW 02/17/2014 1639   APPEARANCEUR CLOUDY (A) 02/17/2014 1639   LABSPEC 1.016 02/17/2014 1639   PHURINE 5.0 02/17/2014 1639   GLUCOSEU NEGATIVE 02/17/2014 1639   HGBUR NEGATIVE 02/17/2014 1639   BILIRUBINUR neg 08/13/2018 [place] 02/17/2014 1639   PROTEINUR  Negative 08/13/2018 1029   PROTEINUR 30 (A) 02/17/2014 1639   UROBILINOGEN 0.2 08/13/2018 1029   UROBILINOGEN 1.0 02/17/2014 1639   NITRITE neg 08/13/2018 1029   NITRITE NEGATIVE 02/17/2014 1639   LEUKOCYTESUR Negative 08/13/2018 1029    STUDIES: No results found.   ASSESSMENT: 73 y.o. Martha Evans, Martha Evans woman  (1) status post right breast Lower outer quadrant biopsy 12/01/2013 for an invasive ductal carcinoma, grade 2 or 3, estrogen receptor 2% "positive", progesterone receptor 2% "positive", both with moderate staining intensity, with an MIB-1 of 84%, and no HER-2 amplification  (2) status post right lumpectomy 12/12/2013 ([id number]) for a pT1b pNX invasive ductal carcinoma, metaplastic, high-grade, estrogen and progesterone receptor negative, repeat HER-2 again negative. Because of prior reduction mammoplasty sentinel lymph node sampling was not performed  (3) adjuvant chemotherapy with cyclophosphamide and docetaxel started 27/51/7001, complicated by peripheral neuropathy, and interrupted after 2 cycles because of diverticular perforation requiring partial colectomy (02/13/2014)  (4) completed 2 additional cycles of chemotherapy 05/22/2014, consisting of carboplatin given day 1 and gemcitabine days 1 and 8 of each 21 day cycle, with Neulasta on day 9.  (5) adjuvant radiation under Dr. Berton Mount followed chemotherapy  (6) tamoxifen started January 2016 stopped 2 weeks into drug;  restarted in February 2016, stopped January 2020  (a) bone density 01/14/2017 finds a T score of -1.4  (7) diverticulitis, status post partial colectomy with colostomy placement may 05/18/2014, status  post colostomy takedown 09/15/2014   PLAN: Kaithlyn is now just about 5 years out from definitive surgery for her breast cancer with no evidence of disease recurrence.  This is very favorable.  Her tumor was minimally estrogen receptor positive.  4 years of antiestrogens is very adequate in this setting.  At this point I am comfortable releasing her back to her primary care physician  All she will need in terms of breast cancer follow-up is her yearly mammogram, which she obtains at the breast center, preferably with tomography, and a yearly physician breast exam  I will be glad to see Versie again at any point in the future if and when the need arises, but as of now are making no further routine appointments for her here.   Magrinat, Virgie Dad, MD  10/25/18 10:34 AM Medical Oncology and Hematology [facility] [address] [place], [place] [address] Tel. [phone]    Fax. [phone]   I, Jacqualyn Posey am acting as a Education administrator for Chauncey Cruel, MD.   I, Lurline Del MD, have reviewed the above documentation for accuracy and completeness, and I agree with the above.

## 2018-10-25 ENCOUNTER — Inpatient Hospital Stay (HOSPITAL_BASED_OUTPATIENT_CLINIC_OR_DEPARTMENT_OTHER): Payer: PPO | Admitting: Oncology

## 2018-10-25 ENCOUNTER — Inpatient Hospital Stay: Payer: PPO | Attending: Oncology

## 2018-10-25 VITALS — BP 127/86 | HR 68 | Temp 97.7°F | Resp 18 | Ht 66.0 in | Wt 196.1 lb

## 2018-10-25 DIAGNOSIS — Z79810 Long term (current) use of selective estrogen receptor modulators (SERMs): Secondary | ICD-10-CM

## 2018-10-25 DIAGNOSIS — Z923 Personal history of irradiation: Secondary | ICD-10-CM

## 2018-10-25 DIAGNOSIS — Z9221 Personal history of antineoplastic chemotherapy: Secondary | ICD-10-CM | POA: Insufficient documentation

## 2018-10-25 DIAGNOSIS — I10 Essential (primary) hypertension: Secondary | ICD-10-CM | POA: Insufficient documentation

## 2018-10-25 DIAGNOSIS — C50511 Malignant neoplasm of lower-outer quadrant of right female breast: Secondary | ICD-10-CM

## 2018-10-25 DIAGNOSIS — Z853 Personal history of malignant neoplasm of breast: Secondary | ICD-10-CM | POA: Diagnosis not present

## 2018-10-25 DIAGNOSIS — Z170 Estrogen receptor positive status [ER+]: Principal | ICD-10-CM

## 2018-10-25 LAB — COMPREHENSIVE METABOLIC PANEL
ALBUMIN: 3.5 g/dL (ref 3.5–5.0)
ALK PHOS: 107 U/L (ref 38–126)
ALT: 38 U/L (ref 0–44)
AST: 34 U/L (ref 15–41)
Anion gap: 7 (ref 5–15)
BILIRUBIN TOTAL: 0.5 mg/dL (ref 0.3–1.2)
BUN: 17 mg/dL (ref 8–23)
CALCIUM: 8.9 mg/dL (ref 8.9–10.3)
CO2: 27 mmol/L (ref 22–32)
Chloride: 107 mmol/L (ref 98–111)
Creatinine, Ser: 0.91 mg/dL (ref 0.44–1.00)
GFR calc Af Amer: >60 mL/min (ref >60)
GFR calc non Af Amer: >60 mL/min (ref >60)
GLUCOSE: 94 mg/dL (ref 70–99)
Potassium: 4 mmol/L (ref 3.5–5.1)
Sodium: 141 mmol/L (ref 135–145)
Total Protein: 6.8 g/dL (ref 6.5–8.1)

## 2018-10-25 LAB — CBC WITH DIFFERENTIAL/PLATELET
ABS IMMATURE GRANULOCYTES: 0.01 10*3/uL (ref 0.00–0.07)
BASOS PCT: 1 %
Basophils Absolute: 0 10*3/uL (ref 0.0–0.1)
EOS ABS: 0.2 10*3/uL (ref 0.0–0.5)
Eosinophils Relative: 4 %
HCT: 38.4 % (ref 36.0–46.0)
Hemoglobin: 12.4 g/dL (ref 12.0–15.0)
Immature Granulocytes: 0 %
Lymphocytes Relative: 26 %
Lymphs Abs: 1.4 10*3/uL (ref 0.7–4.0)
MCH: 31.9 pg (ref 26.0–34.0)
MCHC: 32.3 g/dL (ref 30.0–36.0)
MCV: 98.7 fL (ref 80.0–100.0)
MONO ABS: 0.5 10*3/uL (ref 0.1–1.0)
MONOS PCT: 10 %
NEUTROS ABS: 3.1 10*3/uL (ref 1.7–7.7)
Neutrophils Relative %: 59 %
PLATELETS: 129 10*3/uL — AB (ref 150–400)
RBC: 3.89 MIL/uL (ref 3.87–5.11)
RDW: 12.5 % (ref 11.5–15.5)
WBC: 5.3 10*3/uL (ref 4.0–10.5)
nRBC: 0 % (ref 0.0–0.2)

## 2018-10-26 ENCOUNTER — Telehealth: Payer: Self-pay | Admitting: Oncology

## 2018-10-26 NOTE — Telephone Encounter (Signed)
No los °

## 2018-11-16 ENCOUNTER — Other Ambulatory Visit: Payer: PPO

## 2018-11-16 ENCOUNTER — Ambulatory Visit: Payer: PPO | Admitting: Oncology

## 2018-11-25 ENCOUNTER — Encounter: Payer: Self-pay | Admitting: Family Medicine

## 2018-11-25 ENCOUNTER — Ambulatory Visit (INDEPENDENT_AMBULATORY_CARE_PROVIDER_SITE_OTHER): Payer: PPO | Admitting: Family Medicine

## 2018-11-25 VITALS — BP 120/68 | HR 72 | Temp 97.9°F | Wt 197.0 lb

## 2018-11-25 DIAGNOSIS — J301 Allergic rhinitis due to pollen: Secondary | ICD-10-CM | POA: Diagnosis not present

## 2018-11-25 NOTE — Progress Notes (Signed)
Patient: Martha Evans Female    DOB: [birth year]   73 y.o.   MRN: [id number] Visit Date: 11/25/2018  Today's Provider: Vernie Murders, PA   Chief Complaint  Patient presents with  . Sore Throat   Subjective:     Sore Throat   This is a new problem. The current episode started in the past 7 days. Associated symptoms include congestion, coughing and diarrhea. Pertinent negatives include no abdominal pain, drooling, ear discharge, ear pain, headaches, hoarse voice, plugged ear sensation, neck pain, shortness of breath, stridor, swollen glands, trouble swallowing or vomiting. She has had no exposure to strep or mono. Treatments tried: Dayquil. The treatment provided mild relief.   Patient has had sinus drainage and sore throat for over 1 week. Patient also has symptoms of congestion, sinus pressure, sinus pain, and sneezing. Patient has been taking otc Dayquil with mild relief.   Past Medical History:  Diagnosis Date  . Allergy   . Cancer [facility])    breast cancer right breast  . Cardiomegaly   . Complication of anesthesia    was hard to intubate with thyroid surgery-99  . Difficult intubation 1999   when had thyroid lobectomy  . Dysrhythmia    irregular reuglar heart beat   . GERD (gastroesophageal reflux disease)   . History of chicken pox   . History of measles   . Hypertension   . Personal history of chemotherapy   . Personal history of radiation therapy   . Wears hearing aid    right   Past Surgical History:  Procedure Laterality Date  . ABDOMINAL HYSTERECTOMY  1999  . APPENDECTOMY     as child  . BREAST REDUCTION SURGERY Bilateral    1980's  . BREAST SURGERY Right 12/01/2013   ultrasound guided  . COLON RESECTION N/A 09/15/2014   Procedure: Laparoscopic hand assisted colostomy reversal with resection of left colon and laparoscopic lysis of adhesions ;  Surgeon: Alphonsa Overall, MD;  [facility];  Service: General;  Laterality: N/A;  . COLONOSCOPY    .  COLONOSCOPY N/A 08/22/2014   Procedure: COLONOSCOPY;  Surgeon: Alphonsa Overall, MD;  Location: [facility];  Service: General;  Laterality: N/A;  . ESOPHAGOGASTRODUODENOSCOPY (EGD) WITH PROPOFOL N/A 05/12/2018   Procedure: ESOPHAGOGASTRODUODENOSCOPY (EGD) WITH PROPOFOL;  Surgeon: Manya Silvas, MD;  Location: [facility] ENDOSCOPY;  Service: Endoscopy;  Laterality: N/A;  . LAPAROTOMY N/A 02/11/2014   Procedure: EXPLORATORY LAPAROTOMY, COLON RESECTION, COLOSTOMY;  Surgeon: Shann Medal, MD;  [facility];  Service: General;  Laterality: N/A;  . PARTIAL MASTECTOMY WITH NEEDLE LOCALIZATION AND AXILLARY SENTINEL LYMPH NODE BX Right 12/12/2013   Procedure: PARTIAL MASTECTOMY WITH NEEDLE LOCALIZATION AND AXILLARY SENTINEL LYMPH NODE BX;  Surgeon: Earnstine Regal, MD;  Location: [place];  Service: General;  Laterality: Right;  . PORT-A-CATH REMOVAL Left 04/20/2015   Procedure: REMOVAL PORT-A-CATH;  Surgeon: Armandina Gemma, MD;  Location: [facility] [place];  Service: General;  Laterality: Left;  . PORTACATH PLACEMENT Left 01/02/2014   Procedure: INSERTION PORT-A-CATH;  Surgeon: Earnstine Regal, MD;  [facility];  Service: General;  Laterality: Left;  . ROTATOR CUFF REPAIR  2006   right shoulder  . THYROID LOBECTOMY  1999   right  . TONSILLECTOMY     as child   Family History  Problem Relation Age of Onset  . Cancer Father        bladder & pancreatic  .  Alzheimer's disease Mother   . Diabetes Brother    Allergies  Allergen Reactions  . Codeine Sulfate Nausea And Vomiting  . Tramadol Nausea Only    Nausea     Current Outpatient Medications:  .  ibuprofen (ADVIL,MOTRIN) 800 MG tablet, Take 1 tablet (800 mg total) by mouth every 8 (eight) hours as needed., Disp: 90 tablet, Rfl: 0 .  loratadine (CLARITIN) 10 MG tablet, Take 1 tablet by mouth daily., Disp: , Rfl:  .  omeprazole (PRILOSEC) 20 MG capsule, Take 20 mg by mouth 2 (two) times daily before a meal. , Disp: , Rfl:  .   telmisartan (MICARDIS) 40 MG tablet, Take 40 mg by mouth at bedtime. , Disp: , Rfl:   Review of Systems  Constitutional: Negative for appetite change, chills, fatigue and fever.  HENT: Positive for congestion, rhinorrhea, sinus pressure, sinus pain, sneezing and sore throat. Negative for drooling, ear discharge, ear pain, hoarse voice and trouble swallowing.   Respiratory: Positive for cough. Negative for chest tightness, shortness of breath and stridor.   Cardiovascular: Negative for chest pain and palpitations.  Gastrointestinal: Positive for diarrhea. Negative for abdominal pain, nausea and vomiting.  Musculoskeletal: Negative for neck pain.  Neurological: Negative for dizziness, weakness and headaches.   Social History   Tobacco Use  . Smoking status: Never Smoker  . Smokeless tobacco: Never Used  Substance Use Topics  . Alcohol use: Yes    Comment: 1/monthly     Objective:   BP 120/68 (BP Location: Right Arm, Patient Position: Sitting, Cuff Size: Large)   Pulse 72   Temp 97.9 F (36.6 C) (Oral)   Wt 197 lb (89.4 kg)   SpO2 97%   BMI 31.80 kg/m  Vitals:   11/25/18 1324  BP: 120/68  Pulse: 72  Temp: 97.9 F (36.6 C)  TempSrc: Oral  SpO2: 97%  Weight: 197 lb (89.4 kg)   Physical Exam HENT:     Head: Normocephalic.     Right Ear: Tympanic membrane normal.     Left Ear: Tympanic membrane normal.     Nose: Rhinorrhea present.     Comments: Good transillumination of all sinuses without tenderness.    Mouth/Throat:     Pharynx: No oropharyngeal exudate.     Tonsils: Tonsillar exudate present.     Comments: Slight cobblestoning. Eyes:     Conjunctiva/sclera: Conjunctivae normal.  Neck:     Musculoskeletal: Normal range of motion and neck supple.  Cardiovascular:     Rate and Rhythm: Normal rate and regular rhythm.     Heart sounds: Normal heart sounds.  Pulmonary:     Effort: Pulmonary effort is normal.     Breath sounds: Normal breath sounds.  Abdominal:      General: Bowel sounds are normal.     Palpations: Abdomen is soft.  Lymphadenopathy:     Cervical: No cervical adenopathy.  Skin:    Findings: No rash.  Neurological:     Mental Status: She is oriented to person, place, and time.       Assessment & Plan    1. Seasonal allergic rhinitis due to pollen Started having PND, hoarseness, sneezing, clear rhinorrhea and watery eyes over the past week. Has noticed trees budding and flowers blooming very early this year. Recommend she start the Claritin qd and Flonase Nasal Spray each evening. May use Delsym prn cough and recheck if any fever or signs of progress.     Vernie Murders, [place]  [facility]

## 2018-12-10 ENCOUNTER — Other Ambulatory Visit: Payer: Self-pay | Admitting: Oncology

## 2018-12-10 DIAGNOSIS — Z853 Personal history of malignant neoplasm of breast: Secondary | ICD-10-CM

## 2019-01-05 ENCOUNTER — Other Ambulatory Visit: Payer: Self-pay

## 2019-01-05 DIAGNOSIS — R51 Headache: Principal | ICD-10-CM

## 2019-01-05 DIAGNOSIS — R519 Headache, unspecified: Secondary | ICD-10-CM

## 2019-01-05 MED ORDER — IBUPROFEN 800 MG PO TABS: 800.0000 mg | ORAL_TABLET | Freq: Three times a day (TID) | ORAL | 0 refills | Status: DC | PRN

## 2019-01-20 ENCOUNTER — Other Ambulatory Visit: Payer: Self-pay

## 2019-01-20 ENCOUNTER — Ambulatory Visit
Admission: RE | Admit: 2019-01-20 | Discharge: 2019-01-20 | Disposition: A | Discharge status: Home / Self Care | Payer: PPO | Source: Ambulatory Visit | Attending: Oncology | Admitting: Oncology

## 2019-01-20 DIAGNOSIS — Z853 Personal history of malignant neoplasm of breast: Secondary | ICD-10-CM | POA: Diagnosis not present

## 2019-02-04 ENCOUNTER — Other Ambulatory Visit: Payer: Self-pay | Admitting: Surgery

## 2019-02-04 DIAGNOSIS — E041 Nontoxic single thyroid nodule: Secondary | ICD-10-CM

## 2019-02-14 ENCOUNTER — Ambulatory Visit
Admission: RE | Admit: 2019-02-14 | Discharge: 2019-02-14 | Disposition: A | Discharge status: Home / Self Care | Payer: PPO | Source: Ambulatory Visit | Attending: Surgery | Admitting: Surgery

## 2019-02-14 DIAGNOSIS — E041 Nontoxic single thyroid nodule: Secondary | ICD-10-CM

## 2019-02-14 DIAGNOSIS — R041 Hemorrhage from throat: Secondary | ICD-10-CM | POA: Diagnosis not present

## 2019-02-23 ENCOUNTER — Other Ambulatory Visit: Payer: Self-pay | Admitting: Surgery

## 2019-02-23 DIAGNOSIS — E041 Nontoxic single thyroid nodule: Secondary | ICD-10-CM

## 2019-03-15 ENCOUNTER — Other Ambulatory Visit: Payer: Self-pay

## 2019-03-15 ENCOUNTER — Other Ambulatory Visit (HOSPITAL_COMMUNITY)
Admission: RE | Admit: 2019-03-15 | Discharge: 2019-03-15 | Disposition: A | Discharge status: Home / Self Care | Payer: PPO | Source: Ambulatory Visit | Attending: Radiology | Admitting: Radiology

## 2019-03-15 ENCOUNTER — Ambulatory Visit
Admission: RE | Admit: 2019-03-15 | Discharge: 2019-03-15 | Disposition: A | Discharge status: Home / Self Care | Payer: PPO | Source: Ambulatory Visit | Attending: Surgery | Admitting: Surgery

## 2019-03-15 DIAGNOSIS — E041 Nontoxic single thyroid nodule: Secondary | ICD-10-CM | POA: Insufficient documentation

## 2019-04-06 DIAGNOSIS — E041 Nontoxic single thyroid nodule: Secondary | ICD-10-CM | POA: Diagnosis not present

## 2019-04-06 DIAGNOSIS — Z9889 Other specified postprocedural states: Secondary | ICD-10-CM | POA: Diagnosis not present

## 2019-04-18 DIAGNOSIS — Z9889 Other specified postprocedural states: Secondary | ICD-10-CM | POA: Diagnosis not present

## 2019-04-18 DIAGNOSIS — E041 Nontoxic single thyroid nodule: Secondary | ICD-10-CM | POA: Diagnosis not present

## 2019-05-27 ENCOUNTER — Encounter: Payer: Self-pay | Admitting: Physician Assistant

## 2019-05-27 ENCOUNTER — Other Ambulatory Visit: Payer: Self-pay

## 2019-05-27 ENCOUNTER — Ambulatory Visit (INDEPENDENT_AMBULATORY_CARE_PROVIDER_SITE_OTHER): Payer: PPO | Admitting: Physician Assistant

## 2019-05-27 VITALS — BP 132/84 | HR 70 | Temp 96.6°F | Resp 16 | Wt 198.8 lb

## 2019-05-27 DIAGNOSIS — R300 Dysuria: Secondary | ICD-10-CM

## 2019-05-27 DIAGNOSIS — N3000 Acute cystitis without hematuria: Secondary | ICD-10-CM

## 2019-05-27 LAB — POCT URINALYSIS DIPSTICK
Bilirubin, UA: NEGATIVE
Blood, UA: POSITIVE
Glucose, UA: NEGATIVE
Ketones, UA: NEGATIVE
Nitrite, UA: POSITIVE
Protein, UA: NEGATIVE
Spec Grav, UA: 1.01 (ref 1.010–1.025)
Urobilinogen, UA: 0.2 E.U./dL
pH, UA: 6 (ref 5.0–8.0)

## 2019-05-27 MED ORDER — AMOXICILLIN-POT CLAVULANATE 875-125 MG PO TABS: 1.0000 | ORAL_TABLET | Freq: Two times a day (BID) | ORAL | 0 refills | Status: DC

## 2019-05-27 NOTE — Progress Notes (Signed)
Patient: Martha Evans Female    DOB: [birth year]   73 y.o.   MRN: [id number] Visit Date: 05/27/2019  Today's Provider: Mar Daring, PA-C   Chief Complaint  Patient presents with  . Urinary Tract Infection   Subjective:     HPI Urinary Tract Infection: Patient complains of abnormal smelling urine and burning with urination She has had symptoms for 2 weeks. Patient also complains of back pain. Patient denies fever and vaginal discharge. Patient does have a history of recurrent UTI.  Patient does not have a history of pyelonephritis.   Allergies  Allergen Reactions  . Codeine Sulfate Nausea And Vomiting  . Tramadol Nausea Only    Nausea      Current Outpatient Medications:  .  loratadine (CLARITIN) 10 MG tablet, Take 1 tablet by mouth daily., Disp: , Rfl:  .  omeprazole (PRILOSEC) 20 MG capsule, Take 20 mg by mouth 2 (two) times daily before a meal. , Disp: , Rfl:  .  telmisartan (MICARDIS) 40 MG tablet, Take 40 mg by mouth at bedtime. , Disp: , Rfl:   Review of Systems  Constitutional: Negative for fatigue.  Gastrointestinal: Negative for abdominal pain.  Genitourinary: Positive for dysuria, frequency and urgency. Negative for flank pain, hematuria and vaginal discharge.  Musculoskeletal: Positive for back pain.    Social History   Tobacco Use  . Smoking status: Never Smoker  . Smokeless tobacco: Never Used  Substance Use Topics  . Alcohol use: Yes    Comment: 1/monthly      Objective:   BP 132/84 (BP Location: Left Arm, Patient Position: Sitting, Cuff Size: Large)   Pulse 70   Temp (!) 96.6 F (35.9 C) (Temporal)   Resp 16   Wt 198 lb 12.8 oz (90.2 kg)   BMI 32.09 kg/m  Vitals:   05/27/19 1041  BP: 132/84  Pulse: 70  Resp: 16  Temp: (!) 96.6 F (35.9 C)  TempSrc: Temporal  Weight: 198 lb 12.8 oz (90.2 kg)     Physical Exam Constitutional:      General: She is not in acute distress.    Appearance: Normal appearance. She is  well-developed. She is not diaphoretic.  Cardiovascular:     Rate and Rhythm: Normal rate and regular rhythm.     Heart sounds: Normal heart sounds. No murmur. No friction rub. No gallop.   Pulmonary:     Effort: Pulmonary effort is normal. No respiratory distress.     Breath sounds: Normal breath sounds. No wheezing or rales.  Abdominal:     General: Bowel sounds are normal. There is no distension.     Palpations: Abdomen is soft. There is no mass.     Tenderness: There is abdominal tenderness in the suprapubic area. There is no right CVA tenderness, left CVA tenderness, guarding or rebound.  Skin:    General: Skin is warm and dry.  Neurological:     Mental Status: She is alert and oriented to person, place, and time.      No results found for any visits on 05/27/19.     Assessment & Plan    1. Dysuria UA positive.  - Urine Culture  2. Acute cystitis without hematuria Worsening symptoms. UA positive. Will treat empirically with Augmentin as below. Continue to push fluids. Urine sent for culture. Will follow up pending C&S results. She is to call if symptoms do not improve or if they worsen.  - amoxicillin-clavulanate (  AUGMENTIN) 875-125 MG tablet; Take 1 tablet by mouth 2 (two) times daily.  Dispense: 20 tablet; Refill: 0     Mar Daring, PA-C  [facility]

## 2019-05-27 NOTE — Patient Instructions (Signed)
Urinary Tract Infection, Adult A urinary tract infection (UTI) is an infection of any part of the urinary tract. The urinary tract includes:  The kidneys.  The ureters.  The bladder.  The urethra. These organs make, store, and get rid of pee (urine) in the body. What are the causes? This is caused by germs (bacteria) in your genital area. These germs grow and cause swelling (inflammation) of your urinary tract. What increases the risk? You are more likely to develop this condition if:  You have a small, thin tube (catheter) to drain pee.  You cannot control when you pee or poop (incontinence).  You are female, and: ? You use these methods to prevent pregnancy: ? A medicine that kills sperm (spermicide). ? A device that blocks sperm (diaphragm). ? You have low levels of a female hormone (estrogen). ? You are pregnant.  You have genes that add to your risk.  You are sexually active.  You take antibiotic medicines.  You have trouble peeing because of: ? A prostate that is bigger than normal, if you are female. ? A blockage in the part of your body that drains pee from the bladder (urethra). ? A kidney stone. ? A nerve condition that affects your bladder (neurogenic bladder). ? Not getting enough to drink. ? Not peeing often enough.  You have other conditions, such as: ? Diabetes. ? A weak disease-fighting system (immune system). ? Sickle cell disease. ? Gout. ? Injury of the spine. What are the signs or symptoms? Symptoms of this condition include:  Needing to pee right away (urgently).  Peeing often.  Peeing small amounts often.  Pain or burning when peeing.  Blood in the pee.  Pee that smells bad or not like normal.  Trouble peeing.  Pee that is cloudy.  Fluid coming from the vagina, if you are female.  Pain in the belly or lower back. Other symptoms include:  Throwing up (vomiting).  No urge to eat.  Feeling mixed up (confused).  Being tired  and grouchy (irritable).  A fever.  Watery poop (diarrhea). How is this treated? This condition may be treated with:  Antibiotic medicine.  Other medicines.  Drinking enough water. Follow these instructions at home:  Medicines  Take over-the-counter and prescription medicines only as told by your doctor.  If you were prescribed an antibiotic medicine, take it as told by your doctor. Do not stop taking it even if you start to feel better. General instructions  Make sure you: ? Pee until your bladder is empty. ? Do not hold pee for a long time. ? Empty your bladder after sex. ? Wipe from front to back after pooping if you are a female. Use each tissue one time when you wipe.  Drink enough fluid to keep your pee pale yellow.  Keep all follow-up visits as told by your doctor. This is important. Contact a doctor if:  You do not get better after 1-2 days.  Your symptoms go away and then come back. Get help right away if:  You have very bad back pain.  You have very bad pain in your lower belly.  You have a fever.  You are sick to your stomach (nauseous).  You are throwing up. Summary  A urinary tract infection (UTI) is an infection of any part of the urinary tract.  This condition is caused by germs in your genital area.  There are many risk factors for a UTI. These include having a small, thin   tube to drain pee and not being able to control when you pee or poop.  Treatment includes antibiotic medicines for germs.  Drink enough fluid to keep your pee pale yellow. This information is not intended to replace advice given to you by your health care provider. Make sure you discuss any questions you have with your health care provider. Document Released: 03/03/2008 Document Revised: 09/02/2018 Document Reviewed: 03/25/2018 Elsevier Patient Education  2020 Elsevier Inc.  

## 2019-05-29 LAB — URINE CULTURE

## 2019-05-30 ENCOUNTER — Telehealth: Payer: Self-pay

## 2019-05-30 NOTE — Telephone Encounter (Signed)
Patient advised as directed below. 

## 2019-05-30 NOTE — Telephone Encounter (Signed)
-----   Message from Mar Daring, [place] sent at 05/30/2019  1:38 PM EDT ----- Urine culture was positive for e. Coli. It is sensitive to augmentin. Continue until completed.

## 2019-06-02 ENCOUNTER — Telehealth: Payer: Self-pay | Admitting: Family Medicine

## 2019-06-02 MED ORDER — CIPROFLOXACIN HCL 500 MG PO TABS: 500.0000 mg | ORAL_TABLET | Freq: Two times a day (BID) | ORAL | 0 refills | Status: AC

## 2019-06-02 NOTE — Telephone Encounter (Signed)
Please advise 

## 2019-06-02 NOTE — Telephone Encounter (Signed)
Pt called saying she has been taking Augmentin 875-125 since Friday for a UTI .  She has been having an upset stomach since she started the medication.  She wants to know if there is something else she can take.  CB#  [phone]  Con Memos

## 2019-06-02 NOTE — Telephone Encounter (Signed)
Cultures show infection is sensitive to ciprofloxacin, have sent prescription for ciprofloxacin to her pharmacy to take insteady of Augmentin.

## 2019-06-03 ENCOUNTER — Telehealth: Payer: Self-pay

## 2019-06-03 NOTE — Telephone Encounter (Signed)
Called pharmacy and had prescription transferred to [facility] below.

## 2019-06-03 NOTE — Telephone Encounter (Signed)
Patient advised. She is out of town at [employer]  and needs the new antibiotic sent to [address] in [place]. I called [facility] and had the prescription transferred to the pharmacy in [facility].

## 2019-06-03 NOTE — Telephone Encounter (Signed)
Patient is out town, and needs the antibiotic sent to [place]. [phone].

## 2019-06-16 ENCOUNTER — Telehealth: Payer: Self-pay | Admitting: Family Medicine

## 2019-06-16 DIAGNOSIS — R519 Headache, unspecified: Secondary | ICD-10-CM

## 2019-06-16 NOTE — Telephone Encounter (Signed)
Pt needing a refill of the 800 mg ibuprofen.  Please call into:  [place], [place] [phone] (Phone) [phone] (Fax)   Thanks, [facility]

## 2019-06-17 MED ORDER — IBUPROFEN 800 MG PO TABS: 800.0000 mg | ORAL_TABLET | Freq: Three times a day (TID) | ORAL | 0 refills | Status: DC | PRN

## 2019-10-08 ENCOUNTER — Encounter (HOSPITAL_COMMUNITY): Payer: Self-pay | Admitting: Emergency Medicine

## 2019-10-08 ENCOUNTER — Other Ambulatory Visit: Payer: Self-pay

## 2019-10-08 ENCOUNTER — Emergency Department (HOSPITAL_COMMUNITY)
Admission: EM | Admit: 2019-10-08 | Discharge: 2019-10-09 | Disposition: A | Discharge status: Home / Self Care | Payer: PPO | Attending: Emergency Medicine | Admitting: Emergency Medicine

## 2019-10-08 DIAGNOSIS — N3001 Acute cystitis with hematuria: Secondary | ICD-10-CM | POA: Insufficient documentation

## 2019-10-08 DIAGNOSIS — Z79899 Other long term (current) drug therapy: Secondary | ICD-10-CM | POA: Diagnosis not present

## 2019-10-08 DIAGNOSIS — Z853 Personal history of malignant neoplasm of breast: Secondary | ICD-10-CM | POA: Insufficient documentation

## 2019-10-08 DIAGNOSIS — R509 Fever, unspecified: Secondary | ICD-10-CM | POA: Diagnosis present

## 2019-10-08 DIAGNOSIS — Z20822 Contact with and (suspected) exposure to covid-19: Secondary | ICD-10-CM | POA: Diagnosis not present

## 2019-10-08 DIAGNOSIS — I10 Essential (primary) hypertension: Secondary | ICD-10-CM | POA: Diagnosis not present

## 2019-10-08 MED ORDER — ACETAMINOPHEN 325 MG PO TABS
650.0000 mg | ORAL_TABLET | Freq: Once | ORAL | Status: AC
  Administered 2019-10-09: 650 mg via ORAL
  Filled 2019-10-08: qty 2

## 2019-10-08 NOTE — ED Triage Notes (Signed)
Pt reports that she is having chills, fevers, and feels like cant empty bladder all the way starting yesterday. Took no medications for fevers today, just last night. States that she has tinges in her bladder and feel achy all over.

## 2019-10-09 LAB — URINALYSIS, ROUTINE W REFLEX MICROSCOPIC
Bilirubin Urine: NEGATIVE
Glucose, UA: NEGATIVE mg/dL
Ketones, ur: NEGATIVE mg/dL
Nitrite: NEGATIVE
Protein, ur: NEGATIVE mg/dL
Specific Gravity, Urine: 1.018 (ref 1.005–1.030)
WBC, UA: >50 WBC/hpf — ABNORMAL HIGH (ref 0–5)
pH: 5 (ref 5.0–8.0)

## 2019-10-09 LAB — POC SARS CORONAVIRUS 2 AG -  ED: SARS Coronavirus 2 Ag: NEGATIVE

## 2019-10-09 MED ORDER — AMOXICILLIN-POT CLAVULANATE 875-125 MG PO TABS
1.0000 | ORAL_TABLET | Freq: Once | ORAL | Status: AC
  Administered 2019-10-09: 1 via ORAL
  Filled 2019-10-09: qty 1

## 2019-10-09 MED ORDER — AMOXICILLIN-POT CLAVULANATE 875-125 MG PO TABS: 1.0000 | ORAL_TABLET | Freq: Two times a day (BID) | ORAL | 0 refills | Status: DC

## 2019-10-09 NOTE — ED Provider Notes (Signed)
[place] DEPT Provider Note   CSN: [id number] Arrival date & time: 10/08/19  1719     History Chief Complaint  Patient presents with  . Fever  . Chills    RONNICA SCHRIMSHER is a 74 y.o. female.  The history is provided by the patient.  Fever Severity:  Moderate Onset quality:  Gradual Duration:  1 day Timing:  Constant Progression:  Worsening Chronicity:  New Relieved by:  Nothing Worsened by:  Nothing Associated symptoms: chills, cough, dysuria, headaches and myalgias   Associated symptoms: no chest pain, no diarrhea, no rash, no sore throat and no vomiting    Patient with previous history of breast cancer is in remission presents with fever and chills.  For the past 24 hours she has had fevers and chills and body aches.  She reports mild cough.  she also had mild dysuria.  She reports frequent UTIs.  She has also had back pain    Past Medical History:  Diagnosis Date  . Allergy   . Cancer [facility])    breast cancer right breast  . Cardiomegaly   . Complication of anesthesia    was hard to intubate with thyroid surgery-99  . Difficult intubation 1999   when had thyroid lobectomy  . Dysrhythmia    irregular reuglar heart beat   . GERD (gastroesophageal reflux disease)   . History of chicken pox   . History of measles   . Hypertension   . Personal history of chemotherapy   . Personal history of radiation therapy   . Wears hearing aid    right    Patient Active Problem List   Diagnosis Date Noted  . Allergic rhinitis 10/11/2015  . Colon polyp 10/11/2015  . 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 Vitamin D deficiency 10/11/2015  . Cardiac enlargement 05/24/2015  . Acid reflux 05/24/2015  . Hypercholesteremia 05/24/2015  . BP (high blood pressure) 05/24/2015  . Hot flashes 11/22/2014  . Arthralgia 11/22/2014  . S/P colostomy takedown 09/15/14 09/15/2014  . History of diverticular abscess of colon 07/19/2014  . Drug induced  neutropenia(288.03) 05/22/2014  . ARF (acute renal failure) ([place]) 02/17/2014  . Malignant neoplasm of lower-outer quadrant of right breast of female, estrogen receptor positive ([place]) 12/07/2013  . Multiple thyroid nodules, left lobe 07/25/2013  . History of thyroid lobectomy, right 07/25/2013  . Herpes zoster without complication 99991111    Past Surgical History:  Procedure Laterality Date  . ABDOMINAL HYSTERECTOMY  1999  . APPENDECTOMY     as child  . BREAST REDUCTION SURGERY Bilateral    1980's  . BREAST SURGERY Right 12/01/2013   ultrasound guided  . COLON RESECTION N/A 09/15/2014   Procedure: Laparoscopic hand assisted colostomy reversal with resection of left colon and laparoscopic lysis of adhesions ;  Surgeon: Alphonsa Overall, MD;  Location: [facility];  Service: General;  Laterality: N/A;  . COLONOSCOPY    . COLONOSCOPY N/A 08/22/2014   Procedure: COLONOSCOPY;  Surgeon: Alphonsa Overall, MD;  Location: [facility];  Service: General;  Laterality: N/A;  . ESOPHAGOGASTRODUODENOSCOPY (EGD) WITH PROPOFOL N/A 05/12/2018   Procedure: ESOPHAGOGASTRODUODENOSCOPY (EGD) WITH PROPOFOL;  Surgeon: Manya Silvas, MD;  Location: [facility] ENDOSCOPY;  Service: Endoscopy;  Laterality: N/A;  . LAPAROTOMY N/A 02/11/2014   Procedure: EXPLORATORY LAPAROTOMY, COLON RESECTION, COLOSTOMY;  Surgeon: Shann Medal, MD;  Location: [facility];  Service: General;  Laterality: N/A;  . PARTIAL MASTECTOMY WITH NEEDLE LOCALIZATION AND AXILLARY SENTINEL LYMPH NODE BX  Right 12/12/2013   Procedure: PARTIAL MASTECTOMY WITH NEEDLE LOCALIZATION AND AXILLARY SENTINEL LYMPH NODE BX;  Surgeon: Earnstine Regal, MD;  Location: [place];  Service: General;  Laterality: Right;  . PORT-A-CATH REMOVAL Left 04/20/2015   Procedure: REMOVAL PORT-A-CATH;  Surgeon: Armandina Gemma, MD;  Location: [place];  Service: General;  Laterality: Left;  . PORTACATH PLACEMENT Left 01/02/2014   Procedure: INSERTION PORT-A-CATH;   Surgeon: Earnstine Regal, MD;  Location: [facility];  Service: General;  Laterality: Left;  . ROTATOR CUFF REPAIR  2006   right shoulder  . THYROID LOBECTOMY  1999   right  . TONSILLECTOMY     as child     OB History    Gravida  0   Para  0   Term  0   Preterm  0   AB  0   Living  0     SAB  0   TAB  0   Ectopic  0   Multiple  0   Live Births              Family History  Problem Relation Age of Onset  . Cancer Father        bladder & pancreatic  . Alzheimer's disease Mother   . Diabetes Brother     Social History   Tobacco Use  . Smoking status: Never Smoker  . Smokeless tobacco: Never Used  Substance Use Topics  . Alcohol use: Yes    Comment: 1/monthly  . Drug use: No    Home Medications Prior to Admission medications   Medication Sig Start Date End Date Taking? Authorizing Provider  ibuprofen (ADVIL) 800 MG tablet Take 1 tablet (800 mg total) by mouth every 8 (eight) hours as needed. 06/17/19   Birdie Sons, MD  loratadine (CLARITIN) 10 MG tablet Take 1 tablet by mouth daily. 06/30/13   [provider]  omeprazole (PRILOSEC) 20 MG capsule Take 20 mg by mouth 2 (two) times daily before a meal.     [provider]  telmisartan (MICARDIS) 40 MG tablet Take 40 mg by mouth at bedtime.     [provider]    Allergies    Codeine sulfate and Tramadol  Review of Systems   Review of Systems  Constitutional: Positive for chills and fever.  HENT: Negative for sore throat.   Respiratory: Positive for cough.   Cardiovascular: Negative for chest pain.  Gastrointestinal: Negative for diarrhea and vomiting.  Genitourinary: Positive for dysuria.  Musculoskeletal: Positive for back pain and myalgias.  Skin: Negative for rash.  Neurological: Positive for headaches.  All other systems reviewed and are negative.   Physical Exam Updated Vital Signs BP 140/65   Pulse 97   Temp 100 F (37.8 C) (Oral)   Resp 18   SpO2 100%    Physical Exam CONSTITUTIONAL: Well developed/well nourished HEAD: Normocephalic/atraumatic EYES: EOMI/PERRL ENMT: Mask in place NECK: supple no meningeal signs SPINE/BACK:entire spine nontender CV: S1/S2 noted, no murmurs/rubs/gallops noted LUNGS: Lungs are clear to auscultation bilaterally, no apparent distress ABDOMEN: soft, nontender, no rebound or guarding, bowel sounds noted throughout abdomen GU:no cva tenderness NEURO: Pt is awake/alert/appropriate, moves all extremitiesx4.  No facial droop.   EXTREMITIES: pulses normal/equal, full ROM SKIN: warm, color normal PSYCH: no abnormalities of mood noted, alert and oriented to situation ED Results / Procedures / Treatments   Labs (all labs ordered are listed, but only abnormal results are displayed)  Labs Reviewed  URINALYSIS, ROUTINE W REFLEX MICROSCOPIC - Abnormal; Notable for the following components:      Result Value   APPearance CLOUDY (*)    Hgb urine dipstick SMALL (*)    Leukocytes,Ua SMALL (*)    WBC, UA >50 (*)    Bacteria, UA MANY (*)    All other components within normal limits  URINE CULTURE  POC SARS CORONAVIRUS 2 AG -  ED    EKG None  Radiology No results found.  Procedures Procedures  Medications Ordered in ED Medications  amoxicillin-clavulanate (AUGMENTIN) 875-125 MG per tablet 1 tablet (has no administration in time range)  acetaminophen (TYLENOL) tablet 650 mg (650 mg Oral Given 10/09/19 0007)    ED Course  I have reviewed the triage vital signs and the nursing notes.  Pertinent labs results that were available during my care of the patient were reviewed by me and considered in my medical decision making (see chart for details).    MDM Rules/Calculators/A&P                      Patient presented with fevers and chills.  She was found to have a UTI.  Covid negative. She is in no acute distress.  She is not septic appearing.  She is not vomiting.  I feel she is safe for outpatient  management.  She has history of frequent UTIs and this is similar to prior. Has had good success with Augmentin before and this will be prescribed We discussed strict ER return precautions Final Clinical Impression(s) / ED Diagnoses Final diagnoses:  Acute cystitis with hematuria    Rx / DC Orders ED Discharge Orders         Ordered    amoxicillin-clavulanate (AUGMENTIN) 875-125 MG tablet  2 times daily     10/09/19 0114           Ripley Fraise, MD 10/09/19 0124

## 2019-10-12 LAB — URINE CULTURE: Culture: >=100000 — AB

## 2019-10-13 ENCOUNTER — Telehealth: Payer: Self-pay

## 2019-10-13 NOTE — Telephone Encounter (Signed)
Looks like she has an appointment 10/14/2019 with Tawanna Sat.

## 2019-10-13 NOTE — Telephone Encounter (Signed)
Copied from [place] [id number]. Topic: Appointment Scheduling - Scheduling Inquiry for Clinic >> Oct 13, 2019 10:34 AM Sheran Luz wrote: Patient would like to know if she is able to come in to drop off a urine specimen for a potential UTI. Please advise.

## 2019-10-13 NOTE — Telephone Encounter (Signed)
Spoke with patient on the phone who states that she has an appointment tomorrow afternoon and will give urine specimen at time of visit. KW

## 2019-10-13 NOTE — Telephone Encounter (Signed)
PEC send a CRM stating the patient wanted to know if she could drop off a urine for possible UTI.  The patient was seen in the ED on 10/08/19 and found to have a uti.  She was given Augmentin.  Her culture grew out E. Coli.   Not sure that she would need another urine checked this soon.  Please advise

## 2019-10-14 ENCOUNTER — Ambulatory Visit (INDEPENDENT_AMBULATORY_CARE_PROVIDER_SITE_OTHER): Payer: PPO | Admitting: Physician Assistant

## 2019-10-14 ENCOUNTER — Other Ambulatory Visit: Payer: Self-pay

## 2019-10-14 ENCOUNTER — Encounter: Payer: Self-pay | Admitting: Physician Assistant

## 2019-10-14 VITALS — BP 123/83 | HR 80 | Temp 96.6°F | Resp 18 | Ht 66.0 in | Wt 196.0 lb

## 2019-10-14 DIAGNOSIS — N390 Urinary tract infection, site not specified: Secondary | ICD-10-CM | POA: Diagnosis not present

## 2019-10-14 DIAGNOSIS — R300 Dysuria: Secondary | ICD-10-CM | POA: Diagnosis not present

## 2019-10-14 LAB — POCT URINALYSIS DIPSTICK
Bilirubin, UA: NEGATIVE
Glucose, UA: NEGATIVE
Ketones, UA: NEGATIVE
Leukocytes, UA: NEGATIVE
Nitrite, UA: NEGATIVE
Protein, UA: NEGATIVE
Spec Grav, UA: 1.02 (ref 1.010–1.025)
Urobilinogen, UA: 0.2 E.U./dL
pH, UA: 6 (ref 5.0–8.0)

## 2019-10-14 MED ORDER — NITROFURANTOIN MACROCRYSTAL 50 MG PO CAPS: 50.0000 mg | ORAL_CAPSULE | Freq: Every day | ORAL | 1 refills | Status: DC

## 2019-10-14 NOTE — Patient Instructions (Signed)
Nitrofurantoin tablets or capsules What is this medicine? NITROFURANTOIN (nye troe fyoor AN toyn) is an antibiotic. It is used to treat urinary tract infections. This medicine may be used for other purposes; ask your health care provider or pharmacist if you have questions. COMMON BRAND NAME(S): Macrobid, Macrodantin, Urotoin What should I tell my health care provider before I take this medicine? They need to know if you have any of these conditions:  anemia  diabetes  glucose-6-phosphate dehydrogenase deficiency  kidney disease  liver disease  lung disease  other chronic illness  an unusual or allergic reaction to nitrofurantoin, other antibiotics, other medicines, foods, dyes or preservatives  pregnant or trying to get pregnant  breast-feeding How should I use this medicine? Take this medicine by mouth with a glass of water. Follow the directions on the prescription label. Take this medicine with food or milk. Take your doses at regular intervals. Do not take your medicine more often than directed. Do not stop taking except on your doctor's advice. Talk to your pediatrician regarding the use of this medicine in children. While this drug may be prescribed for selected conditions, precautions do apply. Overdosage: If you think you have taken too much of this medicine contact a poison control center or emergency room at once. NOTE: This medicine is only for you. Do not share this medicine with others. What if I miss a dose? If you miss a dose, take it as soon as you can. If it is almost time for your next dose, take only that dose. Do not take double or extra doses. What may interact with this medicine?  antacids containing magnesium trisilicate  probenecid  quinolone antibiotics like ciprofloxacin, lomefloxacin, norfloxacin and ofloxacin  sulfinpyrazone This list may not describe all possible interactions. Give your health care provider a list of all the medicines, herbs,  non-prescription drugs, or dietary supplements you use. Also tell them if you smoke, drink alcohol, or use illegal drugs. Some items may interact with your medicine. What should I watch for while using this medicine? Tell your doctor or health care professional if your symptoms do not improve or if you get new symptoms. Drink several glasses of water a day. If you are taking this medicine for a long time, visit your doctor for regular checks on your progress. If you are diabetic, you may get a false positive result for sugar in your urine with certain brands of urine tests. Check with your doctor. What side effects may I notice from receiving this medicine? Side effects that you should report to your doctor or health care professional as soon as possible:  allergic reactions like skin rash or hives, swelling of the face, lips, or tongue  chest pain  cough  difficulty breathing  dizziness, drowsiness  fever or infection  joint aches or pains  pale or blue-tinted skin  redness, blistering, peeling or loosening of the skin, including inside the mouth  tingling, burning, pain, or numbness in hands or feet  unusual bleeding or bruising  unusually weak or tired  yellowing of eyes or skin Side effects that usually do not require medical attention (report to your doctor or health care professional if they continue or are bothersome):  dark urine  diarrhea  headache  loss of appetite  nausea or vomiting  temporary hair loss This list may not describe all possible side effects. Call your doctor for medical advice about side effects. You may report side effects to FDA at 1-800-FDA-1088. Where should  I keep my medicine? Keep out of the reach of children. Store at room temperature between 15 and 30 degrees C (59 and 86 degrees F). Protect from light. Throw away any unused medicine after the expiration date. NOTE: This sheet is a summary. It may not cover all possible information.  If you have questions about this medicine, talk to your doctor, pharmacist, or health care provider.  2020 Elsevier/Gold Standard (2008-04-05 15:56:47)

## 2019-10-14 NOTE — Progress Notes (Signed)
Patient: Martha Evans Female    DOB: 06-10-1946   74 y.o.   MRN: [id number] Visit Date: 10/14/2019  Today's Provider: Mar Daring, PA-C   Chief Complaint  Patient presents with  . Urinary Tract Infection   Subjective:     Urinary Tract Infection  This is a recurrent problem. The quality of the pain is described as aching. The pain is at a severity of 4/10. The pain is mild. There has been no fever. She is sexually active. Pertinent negatives include no flank pain or frequency. She has tried increased fluids for the symptoms.  She is currently taking Augmentin 875 2BID for 7 days. She has one more day left of the antibiotic.   She was seen in [facility] ER for abd pain, burning with urination, back pain and fever. She was tested for Covid 19 and was negative. She was found to have UTI, e.coli per culture, and treated with augmentin. Reports feeling about 95% better today. Yesterday urine still had some odor, but now that is gone too.   Allergies  Allergen Reactions  . Codeine Nausea Only and Nausea And Vomiting  . Codeine Sulfate Nausea And Vomiting  . Tramadol Nausea Only    Nausea   . Hydrocodone Nausea Only  . Oxycodone Nausea Only     Current Outpatient Medications:  .  amoxicillin-clavulanate (AUGMENTIN) 875-125 MG tablet, Take 1 tablet by mouth 2 (two) times daily. One po bid x 7 days, Disp: 14 tablet, Rfl: 0 .  ibuprofen (ADVIL) 800 MG tablet, Take 1 tablet (800 mg total) by mouth every 8 (eight) hours as needed., Disp: 90 tablet, Rfl: 0 .  loratadine (CLARITIN) 10 MG tablet, Take 1 tablet by mouth daily., Disp: , Rfl:  .  omeprazole (PRILOSEC) 20 MG capsule, Take 20 mg by mouth 2 (two) times daily before a meal. , Disp: , Rfl:  .  telmisartan (MICARDIS) 40 MG tablet, Take 40 mg by mouth at bedtime. , Disp: , Rfl:   Review of Systems  Constitutional: Negative.   HENT: Negative.   Eyes: Negative.   Respiratory: Negative.   Cardiovascular: Negative.     Gastrointestinal: Negative.   Endocrine: Negative.   Genitourinary: Negative for dysuria, flank pain and frequency.  Skin: Negative.   Allergic/Immunologic: Negative.   Neurological: Positive for dizziness and light-headedness.  Hematological: Negative.   Psychiatric/Behavioral: Negative.     Social History   Tobacco Use  . Smoking status: Never Smoker  . Smokeless tobacco: Never Used  Substance Use Topics  . Alcohol use: Yes    Comment: 1/monthly      Objective:   BP 123/83   Pulse 80   Temp (!) 96.6 F (35.9 C) (Temporal)   Resp 18   Ht 5\' 6"  (1.676 m)   Wt 196 lb (88.9 kg)   BMI 31.64 kg/m  Vitals:   10/14/19 1607  BP: 123/83  Pulse: 80  Resp: 18  Temp: (!) 96.6 F (35.9 C)  TempSrc: Temporal  Weight: 196 lb (88.9 kg)  Height: 5\' 6"  (1.676 m)  Body mass index is 31.64 kg/m.   Physical Exam Vitals reviewed.  Constitutional:      Appearance: Normal appearance. She is well-developed.  HENT:     Head: Normocephalic and atraumatic.  Pulmonary:     Effort: Pulmonary effort is normal. No respiratory distress.  Musculoskeletal:     Cervical back: Normal range of motion and neck supple.  Neurological:     Mental Status: She is alert.  Psychiatric:        Mood and Affect: Mood normal.        Behavior: Behavior normal.        Thought Content: Thought content normal.        Judgment: Judgment normal.      Results for orders placed or performed in visit on 10/14/19  POCT Urinalysis Dipstick  Result Value Ref Range   Color, UA Dark yellow    Clarity, UA Clear    Glucose, UA Negative Negative   Bilirubin, UA Negative    Ketones, UA Negative    Spec Grav, UA 1.020 1.010 - 1.025   Blood, UA Trace    pH, UA 6.0 5.0 - 8.0   Protein, UA Negative Negative   Urobilinogen, UA 0.2 0.2 or 1.0 E.U./dL   Nitrite, UA Negative    Leukocytes, UA Negative Negative   Appearance     Odor         Assessment & Plan    1. Dysuria UA normal today. Continue  Augmentin until completed. ER notes and labs were reviewed personally by me prior to patient arrival.   2. Recurrent UTI Patient has long standing history of recurrent UTIs, having more than 3-4 per year. Will try suppressive therapy with once daily Macrobid as below. Call if adverse effects occur or if UTI recurs.  - nitrofurantoin (MACRODANTIN) 50 MG capsule; Take 1 capsule (50 mg total) by mouth at bedtime.  Dispense: 90 capsule; Refill: Barber, PA-C  [facility]

## 2019-10-21 ENCOUNTER — Telehealth: Payer: Self-pay | Admitting: Family Medicine

## 2019-10-21 NOTE — Telephone Encounter (Signed)
Pt advised.   Thanks,   -Donatello Kleve  

## 2019-10-21 NOTE — Telephone Encounter (Signed)
Patient is calling to ask advise for [facility] - Patient is having diarrhea on the antibotic to clear up the UTI. Please advise CB- [phone]

## 2019-10-21 NOTE — Telephone Encounter (Signed)
Use Imodium as needed °

## 2019-11-03 ENCOUNTER — Telehealth: Payer: Self-pay

## 2019-11-03 NOTE — Telephone Encounter (Signed)
Dr. Caryn Section is out today. Please advise. You last seen this patient for Dysuria and recurrent UTI on 10/14/2019.

## 2019-11-03 NOTE — Telephone Encounter (Signed)
Copied from [place] [id number]. Topic: General - Inquiry >> Nov 03, 2019  2:10 PM Richardo Priest, [place] wrote: Reason for CRM: Pt called in stating she is noticing signs of the uti again. Pt would like to drop off sample today if possible. Please advise as order is needed.

## 2019-11-03 NOTE — Telephone Encounter (Signed)
Yes she can come drop one off and then do a virtual with me at 6 or she can just come in for an appt at 6 if she wishes

## 2019-11-03 NOTE — Telephone Encounter (Signed)
Patient requested appt. For tomorrow.

## 2019-11-04 ENCOUNTER — Encounter: Payer: Self-pay | Admitting: Physician Assistant

## 2019-11-04 ENCOUNTER — Ambulatory Visit (INDEPENDENT_AMBULATORY_CARE_PROVIDER_SITE_OTHER): Payer: PPO | Admitting: Physician Assistant

## 2019-11-04 ENCOUNTER — Other Ambulatory Visit: Payer: Self-pay

## 2019-11-04 VITALS — BP 130/83 | HR 76 | Temp 96.9°F | Resp 16 | Wt 188.0 lb

## 2019-11-04 DIAGNOSIS — N3000 Acute cystitis without hematuria: Secondary | ICD-10-CM | POA: Diagnosis not present

## 2019-11-04 DIAGNOSIS — R300 Dysuria: Secondary | ICD-10-CM

## 2019-11-04 LAB — POCT URINALYSIS DIPSTICK
Bilirubin, UA: NEGATIVE
Blood, UA: NEGATIVE
Glucose, UA: NEGATIVE
Ketones, UA: NEGATIVE
Leukocytes, UA: NEGATIVE
Nitrite, UA: NEGATIVE
Odor: POSITIVE
Protein, UA: NEGATIVE
Spec Grav, UA: 1.02 (ref 1.010–1.025)
Urobilinogen, UA: 0.2 E.U./dL
pH, UA: 6 (ref 5.0–8.0)

## 2019-11-04 MED ORDER — AMOXICILLIN-POT CLAVULANATE 875-125 MG PO TABS: 1.0000 | ORAL_TABLET | Freq: Two times a day (BID) | ORAL | 0 refills | Status: DC

## 2019-11-04 NOTE — Progress Notes (Signed)
Patient: Martha Evans Female    DOB: 03/23/1946   74 y.o.   MRN: [id number] Visit Date: 11/04/2019  Today's Provider: Mar Daring, PA-C   Chief Complaint  Patient presents with  . Dysuria   Subjective:     Dysuria  This is a new problem. The current episode started in the past 7 days (Tuesday night). The problem occurs every urination. The problem has been gradually worsening. The quality of the pain is described as aching and burning. The pain is at a severity of 7/10. There has been no fever. She is sexually active. Associated symptoms include flank pain (left side). Pertinent negatives include no chills, frequency, hematuria, nausea, urgency or vomiting. Associated symptoms comments: Lower abdomen hurts and lower back pain. She has tried increased fluids (cranberry juice and yogurt) for the symptoms. The treatment provided no relief. Her past medical history is significant for recurrent UTIs.  Has been having diarrhea from Macrobid daily dosing for prevention.   Allergies  Allergen Reactions  . Codeine Nausea Only and Nausea And Vomiting  . Codeine Sulfate Nausea And Vomiting  . Tramadol Nausea Only    Nausea   . Hydrocodone Nausea Only  . Oxycodone Nausea Only     Current Outpatient Medications:  .  ibuprofen (ADVIL) 800 MG tablet, Take 1 tablet (800 mg total) by mouth every 8 (eight) hours as needed., Disp: 90 tablet, Rfl: 0 .  loratadine (CLARITIN) 10 MG tablet, Take 1 tablet by mouth daily., Disp: , Rfl:  .  omeprazole (PRILOSEC) 20 MG capsule, Take 20 mg by mouth 2 (two) times daily before a meal. , Disp: , Rfl:  .  telmisartan (MICARDIS) 40 MG tablet, Take 40 mg by mouth at bedtime. , Disp: , Rfl:   Review of Systems  Constitutional: Negative for chills.  Gastrointestinal: Positive for diarrhea. Negative for abdominal pain, blood in stool, constipation, nausea and vomiting.  Genitourinary: Positive for dysuria and flank pain (left side). Negative for  frequency, hematuria and urgency.    Social History   Tobacco Use  . Smoking status: Never Smoker  . Smokeless tobacco: Never Used  Substance Use Topics  . Alcohol use: Yes    Comment: 1/monthly      Objective:   BP 130/83 (BP Location: Left Arm, Patient Position: Sitting, Cuff Size: Large)   Pulse 76   Temp (!) 96.9 F (36.1 C) (Temporal)   Resp 16   Wt 188 lb (85.3 kg)   BMI 30.34 kg/m  Vitals:   11/04/19 1056  BP: 130/83  Pulse: 76  Resp: 16  Temp: (!) 96.9 F (36.1 C)  TempSrc: Temporal  Weight: 188 lb (85.3 kg)  Body mass index is 30.34 kg/m.   Physical Exam Vitals reviewed.  Constitutional:      General: She is not in acute distress.    Appearance: Normal appearance. She is well-developed. She is not ill-appearing or diaphoretic.  Cardiovascular:     Rate and Rhythm: Normal rate and regular rhythm.     Heart sounds: Normal heart sounds. No murmur. No friction rub. No gallop.   Pulmonary:     Effort: Pulmonary effort is normal. No respiratory distress.     Breath sounds: Normal breath sounds. No wheezing or rales.  Abdominal:     General: Abdomen is flat. Bowel sounds are normal. There is no distension.     Palpations: Abdomen is soft. There is no mass.  Tenderness: There is abdominal tenderness in the suprapubic area. There is no right CVA tenderness, left CVA tenderness, guarding or rebound.  Skin:    General: Skin is warm and dry.  Neurological:     Mental Status: She is alert and oriented to person, place, and time.      Results for orders placed or performed in visit on 11/04/19  POCT urinalysis dipstick  Result Value Ref Range   Color, UA Yellow    Clarity, UA cloudy    Glucose, UA Negative Negative   Bilirubin, UA Negative    Ketones, UA Negative    Spec Grav, UA 1.020 1.010 - 1.025   Blood, UA Negative    pH, UA 6.0 5.0 - 8.0   Protein, UA Negative Negative   Urobilinogen, UA 0.2 0.2 or 1.0 E.U./dL   Nitrite, UA Negative     Leukocytes, UA Negative Negative   Appearance     Odor Positive        Assessment & Plan    1. Dysuria UA positive.  - Urine Culture  2. Acute cystitis without hematuria Worsening symptoms. UA positive. Will treat empirically with Augmentin as below. Continue to push fluids. Urine sent for culture. Will follow up pending C&S results. She is to call if symptoms do not improve or if they worsen.  - Urine Culture - amoxicillin-clavulanate (AUGMENTIN) 875-125 MG tablet; Take 1 tablet by mouth 2 (two) times daily.  Dispense: 20 tablet; Refill: 0    Mar Daring, PA-C  [facility]

## 2019-11-04 NOTE — Patient Instructions (Signed)

## 2019-11-06 LAB — SPECIMEN STATUS REPORT

## 2019-11-06 LAB — URINE CULTURE

## 2019-11-07 ENCOUNTER — Telehealth: Payer: Self-pay

## 2019-11-07 NOTE — Telephone Encounter (Signed)
-----   Message from Mar Daring, [place] sent at 11/07/2019  7:28 AM EST ----- Urine was not a clean catch and shows some small amounts of mixed urogenital bacteria present. If symptoms are improving with the antibiotic please continue until completed. If not improving, may need to get a new sample to adjust antibiotic therapy appropriately.

## 2019-11-07 NOTE — Telephone Encounter (Signed)
Called patient and she states that she will continue taking the antibiotic, due to symptoms improving.FYI

## 2019-12-19 ENCOUNTER — Other Ambulatory Visit: Payer: Self-pay | Admitting: Family Medicine

## 2019-12-19 DIAGNOSIS — Z1231 Encounter for screening mammogram for malignant neoplasm of breast: Secondary | ICD-10-CM

## 2020-01-23 ENCOUNTER — Other Ambulatory Visit: Payer: Self-pay

## 2020-01-23 ENCOUNTER — Ambulatory Visit
Admission: RE | Admit: 2020-01-23 | Discharge: 2020-01-23 | Disposition: A | Discharge status: Home / Self Care | Payer: PPO | Source: Ambulatory Visit | Attending: Family Medicine | Admitting: Family Medicine

## 2020-01-23 DIAGNOSIS — Z1231 Encounter for screening mammogram for malignant neoplasm of breast: Secondary | ICD-10-CM | POA: Diagnosis not present

## 2020-02-06 DIAGNOSIS — I493 Ventricular premature depolarization: Secondary | ICD-10-CM | POA: Diagnosis not present

## 2020-02-06 DIAGNOSIS — E7800 Pure hypercholesterolemia, unspecified: Secondary | ICD-10-CM | POA: Diagnosis not present

## 2020-02-06 DIAGNOSIS — I10 Essential (primary) hypertension: Secondary | ICD-10-CM | POA: Diagnosis not present

## 2020-03-28 ENCOUNTER — Other Ambulatory Visit: Payer: Self-pay | Admitting: Family Medicine

## 2020-03-28 DIAGNOSIS — R519 Headache, unspecified: Secondary | ICD-10-CM

## 2020-03-28 MED ORDER — IBUPROFEN 800 MG PO TABS: 800.0000 mg | ORAL_TABLET | Freq: Three times a day (TID) | ORAL | 1 refills | Status: DC | PRN

## 2020-03-28 NOTE — Telephone Encounter (Signed)
Pt called stating that she is needing a new prescription for ibuprofen as it has been a year since the last one was written. Please advise .   [facility] [address], [place]  [place] [address]  Phone: [phone] Fax: [phone]  Hours: Not open 24 hours

## 2020-03-28 NOTE — Telephone Encounter (Signed)
Requested medication (s) are due for refill today:  Yes  Requested medication (s) are on the active medication list:  yes  Future visit scheduled:  no  Last Refill: 06/17/19; #90; no refills  Note to clinic: failed protocol because labs are out of date; please advise  Requested Prescriptions  Pending Prescriptions Disp Refills   ibuprofen (ADVIL) 800 MG tablet 90 tablet 0    Sig: Take 1 tablet (800 mg total) by mouth every 8 (eight) hours as needed.      Analgesics:  NSAIDS Failed - 03/28/2020  2:43 PM      Failed - Cr in normal range and within 360 days    Creatinine  Date Value Ref Range Status  01/26/2017 1.1 0.6 - 1.1 mg/dL Final   Creatinine, Ser  Date Value Ref Range Status  10/25/2018 0.91 0.44 - 1.00 mg/dL Final   Creatinine, Urine  Date Value Ref Range Status  02/23/2014 38.0 mg/dL Final    Comment:    Performed at [facility]          Failed - HGB in normal range and within 360 days    Hemoglobin  Date Value Ref Range Status  10/25/2018 12.4 12.0 - 15.0 g/dL Final   HGB  Date Value Ref Range Status  01/26/2017 11.8 11.6 - 15.9 g/dL Final          Passed - Patient is not pregnant      Passed - Valid encounter within last 12 months    Recent Outpatient Visits           4 months ago [place], Clearnce Sorrel, [place]   5 months ago [place], Jennifer M, [place]   10 months ago [place], [place], [place]   1 year ago Seasonal allergic rhinitis due to pollen   [place], [place]   1 year ago Urinary tract infection without hematuria, site unspecified   [place], [place], [place]

## 2020-06-19 ENCOUNTER — Telehealth: Payer: Self-pay | Admitting: Family Medicine

## 2020-06-19 DIAGNOSIS — N390 Urinary tract infection, site not specified: Secondary | ICD-10-CM

## 2020-06-19 MED ORDER — NITROFURANTOIN MACROCRYSTAL 50 MG PO CAPS: 50.0000 mg | ORAL_CAPSULE | Freq: Every day | ORAL | 1 refills | Status: DC

## 2020-06-19 NOTE — Telephone Encounter (Signed)
Patient wants to continue Macrodantin - prophylactic. Sent for review of request

## 2020-06-19 NOTE — Telephone Encounter (Signed)
Ok to send in? Please advise. Thanks!

## 2020-06-19 NOTE — Telephone Encounter (Signed)
Pt calling to get refill of the med  nitrofurantoin (MACRODANTIN) 50 MG capsule Pt states this is working great and she would like to stay on this med.  [place], [place] Phone:  [phone]  Fax:  [phone]

## 2020-09-12 ENCOUNTER — Other Ambulatory Visit: Payer: Self-pay

## 2020-09-12 ENCOUNTER — Emergency Department: Payer: PPO

## 2020-09-12 ENCOUNTER — Inpatient Hospital Stay
Admission: EM | Admit: 2020-09-12 | Discharge: 2020-09-18 | DRG: 522 | Disposition: A | Discharge status: Skilled Nursing Facility | Payer: PPO | Attending: Internal Medicine | Admitting: Internal Medicine

## 2020-09-12 DIAGNOSIS — Z923 Personal history of irradiation: Secondary | ICD-10-CM

## 2020-09-12 DIAGNOSIS — Y92512 Supermarket, store or market as the place of occurrence of the external cause: Secondary | ICD-10-CM | POA: Diagnosis not present

## 2020-09-12 DIAGNOSIS — D72829 Elevated white blood cell count, unspecified: Secondary | ICD-10-CM | POA: Diagnosis not present

## 2020-09-12 DIAGNOSIS — Z9221 Personal history of antineoplastic chemotherapy: Secondary | ICD-10-CM | POA: Diagnosis not present

## 2020-09-12 DIAGNOSIS — J90 Pleural effusion, not elsewhere classified: Secondary | ICD-10-CM | POA: Diagnosis not present

## 2020-09-12 DIAGNOSIS — Z170 Estrogen receptor positive status [ER+]: Secondary | ICD-10-CM

## 2020-09-12 DIAGNOSIS — S72001A Fracture of unspecified part of neck of right femur, initial encounter for closed fracture: Secondary | ICD-10-CM | POA: Diagnosis not present

## 2020-09-12 DIAGNOSIS — K219 Gastro-esophageal reflux disease without esophagitis: Secondary | ICD-10-CM | POA: Diagnosis present

## 2020-09-12 DIAGNOSIS — Z833 Family history of diabetes mellitus: Secondary | ICD-10-CM | POA: Diagnosis not present

## 2020-09-12 DIAGNOSIS — Z885 Allergy status to narcotic agent status: Secondary | ICD-10-CM

## 2020-09-12 DIAGNOSIS — S72041A Displaced fracture of base of neck of right femur, initial encounter for closed fracture: Secondary | ICD-10-CM | POA: Diagnosis not present

## 2020-09-12 DIAGNOSIS — W010XXA Fall on same level from slipping, tripping and stumbling without subsequent striking against object, initial encounter: Secondary | ICD-10-CM | POA: Diagnosis present

## 2020-09-12 DIAGNOSIS — Z820 Family history of epilepsy and other diseases of the nervous system: Secondary | ICD-10-CM | POA: Diagnosis not present

## 2020-09-12 DIAGNOSIS — M85851 Other specified disorders of bone density and structure, right thigh: Secondary | ICD-10-CM | POA: Diagnosis not present

## 2020-09-12 DIAGNOSIS — S72009A Fracture of unspecified part of neck of unspecified femur, initial encounter for closed fracture: Secondary | ICD-10-CM | POA: Diagnosis not present

## 2020-09-12 DIAGNOSIS — S7291XA Unspecified fracture of right femur, initial encounter for closed fracture: Secondary | ICD-10-CM | POA: Diagnosis not present

## 2020-09-12 DIAGNOSIS — S72031A Displaced midcervical fracture of right femur, initial encounter for closed fracture: Secondary | ICD-10-CM | POA: Diagnosis not present

## 2020-09-12 DIAGNOSIS — R52 Pain, unspecified: Secondary | ICD-10-CM | POA: Diagnosis not present

## 2020-09-12 DIAGNOSIS — I10 Essential (primary) hypertension: Secondary | ICD-10-CM | POA: Diagnosis present

## 2020-09-12 DIAGNOSIS — E669 Obesity, unspecified: Secondary | ICD-10-CM | POA: Diagnosis present

## 2020-09-12 DIAGNOSIS — Z7689 Persons encountering health services in other specified circumstances: Secondary | ICD-10-CM | POA: Diagnosis not present

## 2020-09-12 DIAGNOSIS — S72011A Unspecified intracapsular fracture of right femur, initial encounter for closed fracture: Principal | ICD-10-CM | POA: Diagnosis present

## 2020-09-12 DIAGNOSIS — W19XXXA Unspecified fall, initial encounter: Secondary | ICD-10-CM | POA: Diagnosis not present

## 2020-09-12 DIAGNOSIS — Z853 Personal history of malignant neoplasm of breast: Secondary | ICD-10-CM

## 2020-09-12 DIAGNOSIS — Z96641 Presence of right artificial hip joint: Secondary | ICD-10-CM | POA: Diagnosis not present

## 2020-09-12 DIAGNOSIS — E7800 Pure hypercholesterolemia, unspecified: Secondary | ICD-10-CM | POA: Diagnosis not present

## 2020-09-12 DIAGNOSIS — Z6830 Body mass index (BMI) 30.0-30.9, adult: Secondary | ICD-10-CM | POA: Diagnosis not present

## 2020-09-12 DIAGNOSIS — E890 Postprocedural hypothyroidism: Secondary | ICD-10-CM

## 2020-09-12 DIAGNOSIS — Z96649 Presence of unspecified artificial hip joint: Secondary | ICD-10-CM

## 2020-09-12 DIAGNOSIS — S79911A Unspecified injury of right hip, initial encounter: Secondary | ICD-10-CM | POA: Diagnosis not present

## 2020-09-12 DIAGNOSIS — E58 Dietary calcium deficiency: Secondary | ICD-10-CM | POA: Diagnosis not present

## 2020-09-12 DIAGNOSIS — M8580 Other specified disorders of bone density and structure, unspecified site: Secondary | ICD-10-CM | POA: Diagnosis present

## 2020-09-12 DIAGNOSIS — B029 Zoster without complications: Secondary | ICD-10-CM | POA: Diagnosis not present

## 2020-09-12 DIAGNOSIS — R279 Unspecified lack of coordination: Secondary | ICD-10-CM | POA: Diagnosis not present

## 2020-09-12 DIAGNOSIS — E559 Vitamin D deficiency, unspecified: Secondary | ICD-10-CM | POA: Diagnosis not present

## 2020-09-12 DIAGNOSIS — Z20822 Contact with and (suspected) exposure to covid-19: Secondary | ICD-10-CM | POA: Diagnosis present

## 2020-09-12 DIAGNOSIS — W19XXXD Unspecified fall, subsequent encounter: Secondary | ICD-10-CM | POA: Diagnosis not present

## 2020-09-12 DIAGNOSIS — R5381 Other malaise: Secondary | ICD-10-CM | POA: Diagnosis not present

## 2020-09-12 DIAGNOSIS — D649 Anemia, unspecified: Secondary | ICD-10-CM | POA: Diagnosis not present

## 2020-09-12 DIAGNOSIS — M6281 Muscle weakness (generalized): Secondary | ICD-10-CM | POA: Diagnosis not present

## 2020-09-12 DIAGNOSIS — S72001D Fracture of unspecified part of neck of right femur, subsequent encounter for closed fracture with routine healing: Secondary | ICD-10-CM | POA: Diagnosis not present

## 2020-09-12 DIAGNOSIS — M1611 Unilateral primary osteoarthritis, right hip: Secondary | ICD-10-CM | POA: Diagnosis not present

## 2020-09-12 DIAGNOSIS — Z471 Aftercare following joint replacement surgery: Secondary | ICD-10-CM | POA: Diagnosis not present

## 2020-09-12 DIAGNOSIS — Z01818 Encounter for other preprocedural examination: Secondary | ICD-10-CM | POA: Diagnosis not present

## 2020-09-12 HISTORY — DX: Fracture of unspecified part of neck of unspecified femur, initial encounter for closed fracture: S72.009A

## 2020-09-12 LAB — CBC WITH DIFFERENTIAL/PLATELET
Abs Immature Granulocytes: 0.09 10*3/uL — ABNORMAL HIGH (ref 0.00–0.07)
Basophils Absolute: 0 10*3/uL (ref 0.0–0.1)
Basophils Relative: 0 %
Eosinophils Absolute: 0.2 10*3/uL (ref 0.0–0.5)
Eosinophils Relative: 2 %
HCT: 37 % (ref 36.0–46.0)
Hemoglobin: 12.6 g/dL (ref 12.0–15.0)
Immature Granulocytes: 1 %
Lymphocytes Relative: 14 %
Lymphs Abs: 2 10*3/uL (ref 0.7–4.0)
MCH: 31.3 pg (ref 26.0–34.0)
MCHC: 34.1 g/dL (ref 30.0–36.0)
MCV: 92 fL (ref 80.0–100.0)
Monocytes Absolute: 1 10*3/uL (ref 0.1–1.0)
Monocytes Relative: 7 %
Neutro Abs: 11.1 10*3/uL — ABNORMAL HIGH (ref 1.7–7.7)
Neutrophils Relative %: 76 %
Platelets: 187 10*3/uL (ref 150–400)
RBC: 4.02 MIL/uL (ref 3.87–5.11)
RDW: 12.4 % (ref 11.5–15.5)
WBC: 14.4 10*3/uL — ABNORMAL HIGH (ref 4.0–10.5)
nRBC: 0 % (ref 0.0–0.2)

## 2020-09-12 LAB — COMPREHENSIVE METABOLIC PANEL
ALT: 20 U/L (ref 0–44)
AST: 24 U/L (ref 15–41)
Albumin: 4.1 g/dL (ref 3.5–5.0)
Alkaline Phosphatase: 95 U/L (ref 38–126)
Anion gap: 10 (ref 5–15)
BUN: 23 mg/dL (ref 8–23)
CO2: 26 mmol/L (ref 22–32)
Calcium: 9.1 mg/dL (ref 8.9–10.3)
Chloride: 104 mmol/L (ref 98–111)
Creatinine, Ser: 0.96 mg/dL (ref 0.44–1.00)
GFR, Estimated: >60 mL/min (ref >60)
Glucose, Bld: 101 mg/dL — ABNORMAL HIGH (ref 70–99)
Potassium: 3.6 mmol/L (ref 3.5–5.1)
Sodium: 140 mmol/L (ref 135–145)
Total Bilirubin: 0.7 mg/dL (ref 0.3–1.2)
Total Protein: 7.4 g/dL (ref 6.5–8.1)

## 2020-09-12 LAB — PROTIME-INR
INR: 0.9 (ref 0.8–1.2)
Prothrombin Time: 12 seconds (ref 11.4–15.2)

## 2020-09-12 LAB — TYPE AND SCREEN
ABO/RH(D): O POS
Antibody Screen: NEGATIVE

## 2020-09-12 LAB — RESP PANEL BY RT-PCR (FLU A&B, COVID) ARPGX2
Influenza A by PCR: NEGATIVE
Influenza B by PCR: NEGATIVE
SARS Coronavirus 2 by RT PCR: NEGATIVE

## 2020-09-12 LAB — APTT: aPTT: 31 seconds (ref 24–36)

## 2020-09-12 MED ORDER — POLYETHYLENE GLYCOL 3350 17 G PO PACK: 17.0000 g | PACK | Freq: Every day | ORAL | Status: DC | PRN

## 2020-09-12 MED ORDER — CEFAZOLIN SODIUM-DEXTROSE 2-4 GM/100ML-% IV SOLN
2.0000 g | INTRAVENOUS | Status: AC
  Administered 2020-09-13: 17:00:00 2 g via INTRAVENOUS
  Filled 2020-09-12 (×2): qty 100

## 2020-09-12 MED ORDER — METOPROLOL TARTRATE 5 MG/5ML IV SOLN: 5.0000 mg | Freq: Four times a day (QID) | INTRAVENOUS | Status: DC | PRN

## 2020-09-12 MED ORDER — ENOXAPARIN SODIUM 40 MG/0.4ML ~~LOC~~ SOLN: 40.0000 mg | SUBCUTANEOUS | Status: DC

## 2020-09-12 MED ORDER — FENTANYL CITRATE (PF) 100 MCG/2ML IJ SOLN
50.0000 ug | INTRAMUSCULAR | Status: AC | PRN
  Administered 2020-09-12 (×2): 50 ug via INTRAVENOUS
  Filled 2020-09-12 (×2): qty 2

## 2020-09-12 MED ORDER — ONDANSETRON HCL 4 MG/2ML IJ SOLN
4.0000 mg | Freq: Four times a day (QID) | INTRAMUSCULAR | Status: DC | PRN
  Administered 2020-09-13: 13:00:00 4 mg via INTRAVENOUS
  Filled 2020-09-12: qty 2

## 2020-09-12 MED ORDER — PANTOPRAZOLE SODIUM 40 MG PO TBEC: 40.0000 mg | DELAYED_RELEASE_TABLET | Freq: Every day | ORAL | Status: DC

## 2020-09-12 MED ORDER — NITROFURANTOIN MACROCRYSTAL 50 MG PO CAPS
50.0000 mg | ORAL_CAPSULE | Freq: Every day | ORAL | Status: DC
  Administered 2020-09-13 – 2020-09-17 (×5): 50 mg via ORAL
  Filled 2020-09-12 (×7): qty 1

## 2020-09-12 MED ORDER — OXYCODONE HCL 5 MG PO TABS
5.0000 mg | ORAL_TABLET | ORAL | Status: DC | PRN
  Administered 2020-09-12 – 2020-09-14 (×2): 10 mg via ORAL
  Filled 2020-09-12 (×2): qty 2

## 2020-09-12 MED ORDER — MORPHINE SULFATE (PF) 2 MG/ML IV SOLN
2.0000 mg | INTRAVENOUS | Status: DC | PRN
  Administered 2020-09-12 – 2020-09-13 (×3): 2 mg via INTRAVENOUS
  Filled 2020-09-12 (×3): qty 1

## 2020-09-12 MED ORDER — FENTANYL CITRATE (PF) 100 MCG/2ML IJ SOLN
12.5000 ug | INTRAMUSCULAR | Status: DC | PRN
Start: 2020-09-12 — End: 2020-09-12

## 2020-09-12 MED ORDER — IRBESARTAN 150 MG PO TABS
150.0000 mg | ORAL_TABLET | Freq: Every day | ORAL | Status: DC
  Administered 2020-09-13 – 2020-09-15 (×3): 150 mg via ORAL
  Filled 2020-09-12 (×3): qty 1

## 2020-09-12 MED ORDER — ACETAMINOPHEN 325 MG PO TABS: 650.0000 mg | ORAL_TABLET | Freq: Four times a day (QID) | ORAL | Status: DC | PRN

## 2020-09-12 MED ORDER — TRAMADOL HCL 50 MG PO TABS: 50.0000 mg | ORAL_TABLET | Freq: Four times a day (QID) | ORAL | Status: DC | PRN

## 2020-09-12 MED ORDER — ONDANSETRON HCL 4 MG PO TABS
4.0000 mg | ORAL_TABLET | Freq: Four times a day (QID) | ORAL | Status: DC | PRN
  Filled 2020-09-12: qty 1

## 2020-09-12 MED ORDER — LACTATED RINGERS IV SOLN: INTRAVENOUS | Status: DC

## 2020-09-12 NOTE — H&P (Addendum)
History and Physical    Martha Evans PJA:[id number] DOB: [birth year] DOA: 09/12/2020  PCP: Birdie Sons, MD  Patient coming from: home  I have personally briefly reviewed patient's old medical records in [facility]  Chief Complaint: leg pain  HPI: Martha Evans is a 74 y.o. female with medical history significant of breast cancer in 2015, status post lumpectomy, status post XRT, on tamoxifen x5 years with no recurrence, will hypertension, hypercholesterolemia, GERD who was in the [employer] today at the self checkout.  She turned to get her groceries out of her cart and fell.  She thinks she may be hit the wheel of her grocery cart and fell although she is not clear what caused her to fall down.  At the time of fall she felt her leg was numb she was unable to stand up and bear weight and it appeared to be an abnormal angle.  EMS was called and she presented to the emergency room. ED Course: In the ED she was found to have a right femoral neck fracture.  Review of Systems: As per HPI otherwise 10 point review of systems negative.  She does report chronic cough that may or may not be related to her GERD.  Past Medical History:  Diagnosis Date  . Allergy   . Cancer [facility])    breast cancer right breast  . Cardiomegaly   . Complication of anesthesia    was hard to intubate with thyroid surgery-99  . Difficult intubation 1999   when had thyroid lobectomy  . Dysrhythmia    irregular reuglar heart beat   . GERD (gastroesophageal reflux disease)   . History of chicken pox   . History of measles   . Hypertension   . Personal history of chemotherapy   . Personal history of radiation therapy   . Wears hearing aid    right    Past Surgical History:  Procedure Laterality Date  . ABDOMINAL HYSTERECTOMY  1999  . APPENDECTOMY     as child  . BREAST REDUCTION SURGERY Bilateral    1980's  . BREAST SURGERY Right 12/01/2013   ultrasound guided  . COLON RESECTION N/A  09/15/2014   Procedure: Laparoscopic hand assisted colostomy reversal with resection of left colon and laparoscopic lysis of adhesions ;  Surgeon: Alphonsa Overall, MD;  Location: [facility];  Service: General;  Laterality: N/A;  . COLONOSCOPY    . COLONOSCOPY N/A 08/22/2014   Procedure: COLONOSCOPY;  Surgeon: Alphonsa Overall, MD;  Location: [facility];  Service: General;  Laterality: N/A;  . ESOPHAGOGASTRODUODENOSCOPY (EGD) WITH PROPOFOL N/A 05/12/2018   Procedure: ESOPHAGOGASTRODUODENOSCOPY (EGD) WITH PROPOFOL;  Surgeon: Manya Silvas, MD;  Location: [facility] ENDOSCOPY;  Service: Endoscopy;  Laterality: N/A;  . LAPAROTOMY N/A 02/11/2014   Procedure: EXPLORATORY LAPAROTOMY, COLON RESECTION, COLOSTOMY;  Surgeon: Shann Medal, MD;  Location: [facility];  Service: General;  Laterality: N/A;  . PARTIAL MASTECTOMY WITH NEEDLE LOCALIZATION AND AXILLARY SENTINEL LYMPH NODE BX Right 12/12/2013   Procedure: PARTIAL MASTECTOMY WITH NEEDLE LOCALIZATION AND AXILLARY SENTINEL LYMPH NODE BX;  Surgeon: Earnstine Regal, MD;  Location: [facility];  Service: General;  Laterality: Right;  . PORT-A-CATH REMOVAL Left 04/20/2015   Procedure: REMOVAL PORT-A-CATH;  Surgeon: Armandina Gemma, MD;  Location: [facility];  Service: General;  Laterality: Left;  . PORTACATH PLACEMENT Left 01/02/2014   Procedure: INSERTION PORT-A-CATH;  Surgeon: Earnstine Regal, MD;  Location: [facility];  Service:  General;  Laterality: Left;  . ROTATOR CUFF REPAIR  2006   right shoulder  . THYROID LOBECTOMY  1999   right  . TONSILLECTOMY     as child     reports that she has never smoked. She has never used smokeless tobacco. She reports current alcohol use. She reports that she does not use drugs.  Allergies  Allergen Reactions  . Codeine Nausea Only and Nausea And Vomiting  . Codeine Sulfate Nausea And Vomiting  . Tramadol Nausea Only    Nausea   . Hydrocodone Nausea Only  . Oxycodone Nausea Only    Family History  Problem  Relation Age of Onset  . Cancer Father        bladder & pancreatic  . Alzheimer's disease Mother   . Diabetes Brother     Prior to Admission medications   Medication Sig Start Date End Date Taking? Authorizing Provider  nitrofurantoin (MACRODANTIN) 50 MG capsule Take 1 capsule (50 mg total) by mouth at bedtime. 06/19/20  Yes Birdie Sons, MD  omeprazole (PRILOSEC) 20 MG capsule Take 20 mg by mouth 2 (two) times daily before a meal.    Yes [provider]  telmisartan (MICARDIS) 40 MG tablet Take 40 mg by mouth at bedtime.    Yes [provider]    Physical Exam: Vitals:   09/12/20 1625 09/12/20 1627 09/12/20 1700  BP: (!) 161/136 (!) 141/79 134/72  Pulse: 89 85 85  Resp: 18  17  Temp: 98 F (36.7 C)    TempSrc: Oral    SpO2: 95% 97% 96%  Weight: 85.3 kg    Height: 5\' 6"  (1.676 m)      Constitutional: NAD, calm, comfortable Eyes: PERRL, lids and conjunctivae normal ENMT: Mucous membranes are moist. Posterior pharynx clear of any exudate or lesions.Normal dentition.  Neck: normal, supple, no masses, no thyromegaly Respiratory: clear to auscultation bilaterally, no wheezing, no crackles. Normal respiratory effort. No accessory muscle use.  Cardiovascular: Regular rate and rhythm, no murmurs / rubs / gallops. No extremity edema. 2+ pedal pulses. No carotid bruits.  Abdomen: no tenderness, no masses palpated. No hepatosplenomegaly. Bowel sounds positive.  Musculoskeletal: no clubbing / cyanosis.  Right leg is shorter than the left and externally rotated. Skin: no rashes, lesions, ulcers. No induration Neurologic: CN 2-12 grossly intact. Sensation intact,.   Psychiatric: Normal judgment and insight. Alert and oriented x 3. Normal mood.   Labs on Admission: I have personally reviewed following labs and imaging studies  CBC: Recent Labs  Lab 09/12/20 1630  WBC 14.4*  NEUTROABS 11.1*  HGB 12.6  HCT 37.0  MCV 92.0  PLT 834   Basic Metabolic  Panel: Recent Labs  Lab 09/12/20 1630  NA 140  K 3.6  CL 104  CO2 26  GLUCOSE 101*  BUN 23  CREATININE 0.96  CALCIUM 9.1   GFR: Estimated Creatinine Clearance: 57.4 mL/min (by C-G formula based on SCr of 0.96 mg/dL). Liver Function Tests: Recent Labs  Lab 09/12/20 1630  AST 24  ALT 20  ALKPHOS 95  BILITOT 0.7  PROT 7.4  ALBUMIN 4.1   Coagulation Profile: Recent Labs  Lab 09/12/20 1630  INR 0.9   Urine analysis:    Component Value Date/Time   COLORURINE YELLOW 10/08/2019 2359   APPEARANCEUR CLOUDY (A) 10/08/2019 2359   LABSPEC 1.018 10/08/2019 2359   PHURINE 5.0 10/08/2019 2359   GLUCOSEU NEGATIVE 10/08/2019 2359   HGBUR SMALL (A) 10/08/2019 2359  BILIRUBINUR Negative 11/04/2019 1111   KETONESUR NEGATIVE 10/08/2019 2359   PROTEINUR Negative 11/04/2019 1111   PROTEINUR NEGATIVE 10/08/2019 2359   UROBILINOGEN 0.2 11/04/2019 1111   UROBILINOGEN 1.0 02/17/2014 1639   NITRITE Negative 11/04/2019 1111   NITRITE NEGATIVE 10/08/2019 2359   LEUKOCYTESUR Negative 11/04/2019 1111   LEUKOCYTESUR SMALL (A) 10/08/2019 2359    Radiological Exams on Admission: DG Chest 1 View  Result Date: 09/12/2020 CLINICAL DATA:  74 year old female under preoperative evaluation. EXAM: CHEST  1 VIEW COMPARISON:  Chest x-ray 07/09/2017. FINDINGS: Lung volumes are normal. No consolidative airspace disease. No pleural effusions. No pneumothorax. No pulmonary nodule or mass noted. Pulmonary vasculature and the cardiomediastinal silhouette are within normal limits. Surgical clips project over the right breast likely from prior lumpectomy. Surgical clips also noted in the lower right cervical region, likely from prior hemithyroidectomy. IMPRESSION: 1. No radiographic evidence of acute cardiopulmonary disease. Electronically Signed   By: Vinnie Langton M.D.   On: 09/12/2020 18:33   DG Hip Unilat  With Pelvis 2-3 Views Right  Result Date: 09/12/2020 CLINICAL DATA:  74 year old female with  trauma to the right hip. EXAM: DG HIP (WITH OR WITHOUT PELVIS) 2-3V RIGHT COMPARISON:  None. FINDINGS: There is a mildly displaced fracture of the right femoral neck with mild proximal migration of the femoral shaft. The bones are osteopenic. There is no dislocation. Mild bilateral hip osteoarthritic changes. The soft tissues are unremarkable. IMPRESSION: Mildly displaced fracture of the right femoral neck. No dislocation. Electronically Signed   By: Anner Crete M.D.   On: 09/12/2020 18:36    EKG: Independently reviewed.  Sinus rhythm, few PVCs, question right axis deviation, T wave abnormalities that are nonspecific.  Assessment/Plan Principal Problem:   Hip fracture (HCC) Active Problems:   History of thyroid lobectomy, right   Malignant neoplasm of lower-outer quadrant of right breast of female, estrogen receptor positive (HCC)   Acid reflux   Hypercholesteremia   BP (high blood pressure)   Osteopenia  Hip fracture Per orthopedics, will need OR.  The question is whether this is done tonight or tomorrow.  The patient has been n.p.o. since 12 noon.  We will continue n.p.o. status for now unless her surgery is scheduled in the morning then she will be n.p.o. after midnight Pain control with fentanyl  Osteopenia On last DEXA scan in 2018 Consider further work-up for osteoporosis plus or minus treatment with PCP  History of right lobe thyroidectomy Reportedly has normal TSH levels with PCP  History of breast cancer Status post lumpectomy, XRT, adjuvant chemo with tamoxifen use x5 years.  Hypertension Continue Micardis--irbesartan as substitution during hospitalization. Present dose is 40 mg daily her blood pressure is quite elevated right now we will continue to observe she may need this dose increased again.  Hypercholesterolemia Continue diet  GERD Protonix daily substitute for home Prilosec.   DVT prophylaxis: Lovenox SQ Code Status: Full code  Family Communication:  Husband at bedside Disposition Plan: Home Consults called: Ortho the ED Admission status: Inpatient Patient is an inpatient due to surgical needs and likelihood of staying past 2 midnights.  Donnamae Jude MD [facility]  If 7PM-7AM, please contact night-coverage 09/12/2020, 8:46 PM

## 2020-09-12 NOTE — ED Provider Notes (Signed)
[facility] Emergency Department Provider Note   ____________________________________________    I have reviewed the triage vital signs and the nursing notes.   HISTORY  Chief Complaint Hip Injury     HPI Martha Evans is a 74 y.o. female who presents after a fall.  Patient reports she was in a grocery store, turned around to the self checkout lane and tripped over her own feet and fell onto her right hip.  She was unable to stand up.  She reports significant pain in the right hip if she tries to move her leg, she is able to move her foot.  Denies head injury.  No chest pain.  No back pain.  No neck injury.  No abdominal pain nausea or vomiting.  She is not on blood thinners.  Last ate around noon.  Past Medical History:  Diagnosis Date  . Allergy   . Cancer [facility])    breast cancer right breast  . Cardiomegaly   . Complication of anesthesia    was hard to intubate with thyroid surgery-99  . Difficult intubation 1999   when had thyroid lobectomy  . Dysrhythmia    irregular reuglar heart beat   . GERD (gastroesophageal reflux disease)   . History of chicken pox   . History of measles   . Hypertension   . Personal history of chemotherapy   . Personal history of radiation therapy   . Wears hearing aid    right    Patient Active Problem List   Diagnosis Date Noted  . Hip fracture ([place]) 09/12/2020  . Allergic rhinitis 10/11/2015  . Colon polyp 10/11/2015  . 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 Vitamin D deficiency 10/11/2015  . Cardiac enlargement 05/24/2015  . Acid reflux 05/24/2015  . Hypercholesteremia 05/24/2015  . BP (high blood pressure) 05/24/2015  . Hot flashes 11/22/2014  . Arthralgia 11/22/2014  . S/P colostomy takedown 09/15/14 09/15/2014  . History of diverticular abscess of colon 07/19/2014  . Malignant neoplasm of right breast ([place]) 07/19/2014  . 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 ARF (acute renal failure)  ([place]) 02/17/2014  . Malignant neoplasm of lower-outer quadrant of right breast of female, estrogen receptor positive ([place]) 12/07/2013  . Multiple thyroid nodules, left lobe 07/25/2013  . History of thyroid lobectomy, right 07/25/2013  . Herpes zoster without complication 63/09/6008    Past Surgical History:  Procedure Laterality Date  . ABDOMINAL HYSTERECTOMY  1999  . APPENDECTOMY     as child  . BREAST REDUCTION SURGERY Bilateral    1980's  . BREAST SURGERY Right 12/01/2013   ultrasound guided  . COLON RESECTION N/A 09/15/2014   Procedure: Laparoscopic hand assisted colostomy reversal with resection of left colon and laparoscopic lysis of adhesions ;  Surgeon: Alphonsa Overall, MD;  Location: [facility];  Service: General;  Laterality: N/A;  . COLONOSCOPY    . COLONOSCOPY N/A 08/22/2014   Procedure: COLONOSCOPY;  Surgeon: Alphonsa Overall, MD;  Location: [facility];  Service: General;  Laterality: N/A;  . ESOPHAGOGASTRODUODENOSCOPY (EGD) WITH PROPOFOL N/A 05/12/2018   Procedure: ESOPHAGOGASTRODUODENOSCOPY (EGD) WITH PROPOFOL;  Surgeon: Manya Silvas, MD;  Location: [facility] ENDOSCOPY;  Service: Endoscopy;  Laterality: N/A;  . LAPAROTOMY N/A 02/11/2014   Procedure: EXPLORATORY LAPAROTOMY, COLON RESECTION, COLOSTOMY;  Surgeon: Shann Medal, MD;  Location: [facility];  Service: General;  Laterality: N/A;  . PARTIAL MASTECTOMY WITH NEEDLE LOCALIZATION AND AXILLARY SENTINEL LYMPH NODE BX Right 12/12/2013   Procedure: PARTIAL  MASTECTOMY WITH NEEDLE LOCALIZATION AND AXILLARY SENTINEL LYMPH NODE BX;  Surgeon: Earnstine Regal, MD;  Location: [place];  Service: General;  Laterality: Right;  . PORT-A-CATH REMOVAL Left 04/20/2015   Procedure: REMOVAL PORT-A-CATH;  Surgeon: Armandina Gemma, MD;  Location: [place];  Service: General;  Laterality: Left;  . PORTACATH PLACEMENT Left 01/02/2014   Procedure: INSERTION PORT-A-CATH;  Surgeon: Earnstine Regal, MD;  Location: [facility];  Service:  General;  Laterality: Left;  . ROTATOR CUFF REPAIR  2006   right shoulder  . THYROID LOBECTOMY  1999   right  . TONSILLECTOMY     as child    Prior to Admission medications   Medication Sig Start Date End Date Taking? Authorizing Provider  nitrofurantoin (MACRODANTIN) 50 MG capsule Take 1 capsule (50 mg total) by mouth at bedtime. 06/19/20  Yes Birdie Sons, MD  omeprazole (PRILOSEC) 20 MG capsule Take 20 mg by mouth 2 (two) times daily before a meal.    Yes [provider]  telmisartan (MICARDIS) 40 MG tablet Take 40 mg by mouth at bedtime.    Yes [provider]     Allergies Codeine, Codeine sulfate, Tramadol, Hydrocodone, and Oxycodone  Family History  Problem Relation Age of Onset  . Cancer Father        bladder & pancreatic  . Alzheimer's disease Mother   . Diabetes Brother     Social History Social History   Tobacco Use  . Smoking status: Never Smoker  . Smokeless tobacco: Never Used  Vaping Use  . Vaping Use: Never used  Substance Use Topics  . Alcohol use: Yes    Comment: 1/monthly  . Drug use: No    Review of Systems  Constitutional: No fever/chills Eyes: No visual changes.  ENT: No neck pain or facial injury Cardiovascular: Denies chest pain. Respiratory: Denies shortness of breath. Gastrointestinal: No abdominal pain.  No nausea, no vomiting.   Genitourinary: Negative for dysuria. Musculoskeletal: As above Skin: Negative for rash. Neurological: Negative for headaches or weakness   ____________________________________________   PHYSICAL EXAM:  VITAL SIGNS: ED Triage Vitals  Enc Vitals Group     BP 09/12/20 1625 (!) 161/136     Pulse Rate 09/12/20 1625 89     Resp 09/12/20 1625 18     Temp 09/12/20 1625 98 F (36.7 C)     Temp Source 09/12/20 1625 Oral     SpO2 09/12/20 1625 95 %     Weight 09/12/20 1625 85.3 kg (188 lb)     Height 09/12/20 1625 1.676 m (5\' 6" )     Head Circumference --      Peak Flow --       Pain Score 09/12/20 1623 8     Pain Loc --      Pain Edu? --      Excl. in [place]? --     Constitutional: Alert and oriented.  Eyes: Conjunctivae are normal.  Head: Atraumatic. Nose: No congestion/rhinnorhea. Mouth/Throat: Mucous membranes are moist.   Neck:  Painless ROM Cardiovascular: Normal rate, regular rhythm. Grossly normal heart sounds.  Good peripheral circulation.  No chest wall tenderness to palpation Respiratory: Normal respiratory effort.  No retractions. Lungs CTAB. Gastrointestinal: Soft and nontender. No distention.  No CVA tenderness. Genitourinary: deferred Musculoskeletal: Right lower extremity, shortened and externally rotated, highly suspicious for hip fracture, 2+ distal pulses warm and well-perfused Neurologic:  Normal speech and language. No gross focal neurologic deficits  are appreciated.  Skin:  Skin is warm, dry and intact. No rash noted. Psychiatric: Mood and affect are normal. Speech and behavior are normal.  ____________________________________________   LABS (all labs ordered are listed, but only abnormal results are displayed)  Labs Reviewed  CBC WITH DIFFERENTIAL/PLATELET - Abnormal; Notable for the following components:      Result Value   WBC 14.4 (*)    Neutro Abs 11.1 (*)    Abs Immature Granulocytes 0.09 (*)    All other components within normal limits  COMPREHENSIVE METABOLIC PANEL - Abnormal; Notable for the following components:   Glucose, Bld 101 (*)    All other components within normal limits  RESP PANEL BY RT-PCR (FLU A&B, COVID) ARPGX2  PROTIME-INR  APTT  TYPE AND SCREEN   ____________________________________________  EKG  ED ECG REPORT I, Lavonia Drafts, the attending physician, personally viewed and interpreted this ECG.  Date: 09/12/2020  Rhythm: normal sinus rhythm QRS Axis: normal Intervals: normal ST/T Wave abnormalities: normal Narrative Interpretation: no evidence of acute  ischemia  ____________________________________________  RADIOLOGY  X-ray hip reviewed by me ____________________________________________   PROCEDURES  Procedure(s) performed: No  Procedures   Critical Care performed: No ____________________________________________   INITIAL IMPRESSION / ASSESSMENT AND PLAN / ED COURSE  Pertinent labs & imaging results that were available during my care of the patient were reviewed by me and considered in my medical decision making (see chart for details).  Patient presents after a fall as described above, exam is suspicious for hip fracture, pending x-ray, IV fentanyl as needed  Lab work is overall reassuring, mild elevation in white blood cell count is nonspecific likely related to injury.  Covid test is negative.  Discussed with Dr. Mack Guise of orthopedics, he notes OR is full tonight, surgery will be tomorrow, recommends n.p.o. after midnight, discussed with hospitalist for admission      ____________________________________________   FINAL CLINICAL IMPRESSION(S) / ED DIAGNOSES  Final diagnoses:  Closed fracture of right hip, initial encounter [facility])        Note:  This document was prepared using Dragon voice recognition software and may include unintentional dictation errors.   Lavonia Drafts, MD 09/12/20 Dre[facility] Halon

## 2020-09-12 NOTE — ED Triage Notes (Signed)
Pt here via [facility] from home after falling at [employer] and landing on her right hip and right elbow. Pt has shortening and rotation. Pt NAD on arrival.

## 2020-09-12 NOTE — Consult Note (Signed)
ORTHOPAEDIC CONSULTATION  REQUESTING PHYSICIAN: Donnamae Jude, MD  Chief Complaint: Right hip pain s/p fall.    HPI: Martha Evans is a 74 y.o. female who complains of right hip pain after falling at [employer].   Patient explains she sustained a mechanical fall onto her right hip when her foot got caught on her shopping cart in the checkout aisle.  Patient was diagnosed with a right displaced femoral neck hip fracture by xray upon presenting to the [facility] ER.   Patient complains of right hip pain but denies other injuries.    Past Medical History:  Diagnosis Date  . Allergy   . Cancer [facility])    breast cancer right breast  . Cardiomegaly   . Complication of anesthesia    was hard to intubate with thyroid surgery-99  . Difficult intubation 1999   when had thyroid lobectomy  . Dysrhythmia    irregular reuglar heart beat   . GERD (gastroesophageal reflux disease)   . History of chicken pox   . History of measles   . Hypertension   . Personal history of chemotherapy   . Personal history of radiation therapy   . Wears hearing aid    right   Past Surgical History:  Procedure Laterality Date  . ABDOMINAL HYSTERECTOMY  1999  . APPENDECTOMY     as child  . BREAST REDUCTION SURGERY Bilateral    1980's  . BREAST SURGERY Right 12/01/2013   ultrasound guided  . COLON RESECTION N/A 09/15/2014   Procedure: Laparoscopic hand assisted colostomy reversal with resection of left colon and laparoscopic lysis of adhesions ;  Surgeon: Alphonsa Overall, MD;  [facility];  Service: General;  Laterality: N/A;  . COLONOSCOPY    . COLONOSCOPY N/A 08/22/2014   Procedure: COLONOSCOPY;  Surgeon: Alphonsa Overall, MD;  Location: [facility];  Service: General;  Laterality: N/A;  . ESOPHAGOGASTRODUODENOSCOPY (EGD) WITH PROPOFOL N/A 05/12/2018   Procedure: ESOPHAGOGASTRODUODENOSCOPY (EGD) WITH PROPOFOL;  Surgeon: Manya Silvas, MD;  Location: [facility] ENDOSCOPY;  Service: Endoscopy;   Laterality: N/A;  . LAPAROTOMY N/A 02/11/2014   Procedure: EXPLORATORY LAPAROTOMY, COLON RESECTION, COLOSTOMY;  Surgeon: Shann Medal, MD;  [facility];  Service: General;  Laterality: N/A;  . PARTIAL MASTECTOMY WITH NEEDLE LOCALIZATION AND AXILLARY SENTINEL LYMPH NODE BX Right 12/12/2013   Procedure: PARTIAL MASTECTOMY WITH NEEDLE LOCALIZATION AND AXILLARY SENTINEL LYMPH NODE BX;  Surgeon: Earnstine Regal, MD;  Location: [place];  Service: General;  Laterality: Right;  . PORT-A-CATH REMOVAL Left 04/20/2015   Procedure: REMOVAL PORT-A-CATH;  Surgeon: Armandina Gemma, MD;  Location: [place];  Service: General;  Laterality: Left;  . PORTACATH PLACEMENT Left 01/02/2014   Procedure: INSERTION PORT-A-CATH;  Surgeon: Earnstine Regal, MD;  [facility];  Service: General;  Laterality: Left;  . ROTATOR CUFF REPAIR  2006   right shoulder  . THYROID LOBECTOMY  1999   right  . TONSILLECTOMY     as child   Social History   Socioeconomic History  . Marital status: Married    Spouse name: Not on file  . Number of children: 0  . Years of education: Not on file  . Highest education level: Not on file  Occupational History  . Occupation: Retired  Tobacco Use  . Smoking status: Never Smoker  . Smokeless tobacco: Never Used  Vaping Use  . Vaping Use: Never used  Substance and Sexual Activity  . Alcohol  use: Yes    Comment: 1/monthly  . Drug use: No  . Sexual activity: Yes  Other Topics Concern  . Not on file  Social History Narrative  . Not on file   Social Determinants of Health   Financial Resource Strain: Not on file  Food Insecurity: Not on file  Transportation Needs: Not on file  Physical Activity: Not on file  Stress: Not on file  Social Connections: Not on file   Family History  Problem Relation Age of Onset  . Cancer Father        bladder & pancreatic  . Alzheimer's disease Mother   . Diabetes Brother    Allergies  Allergen Reactions   . Codeine Nausea Only and Nausea And Vomiting  . Codeine Sulfate Nausea And Vomiting  . Tramadol Nausea Only    Nausea   . Hydrocodone Nausea Only  . Oxycodone Nausea Only   Prior to Admission medications   Medication Sig Start Date End Date Taking? Authorizing Provider  nitrofurantoin (MACRODANTIN) 50 MG capsule Take 1 capsule (50 mg total) by mouth at bedtime. 06/19/20  Yes Birdie Sons, MD  omeprazole (PRILOSEC) 20 MG capsule Take 20 mg by mouth 2 (two) times daily before a meal.    Yes [provider]  telmisartan (MICARDIS) 40 MG tablet Take 40 mg by mouth at bedtime.    Yes [provider]   DG Chest 1 View  Result Date: 09/12/2020 CLINICAL DATA:  74 year old female under preoperative evaluation. EXAM: CHEST  1 VIEW COMPARISON:  Chest x-ray 07/09/2017. FINDINGS: Lung volumes are normal. No consolidative airspace disease. No pleural effusions. No pneumothorax. No pulmonary nodule or mass noted. Pulmonary vasculature and the cardiomediastinal silhouette are within normal limits. Surgical clips project over the right breast likely from prior lumpectomy. Surgical clips also noted in the lower right cervical region, likely from prior hemithyroidectomy. IMPRESSION: 1. No radiographic evidence of acute cardiopulmonary disease. Electronically Signed   By: Vinnie Langton M.D.   On: 09/12/2020 18:33   DG Hip Unilat  With Pelvis 2-3 Views Right  Result Date: 09/12/2020 CLINICAL DATA:  74 year old female with trauma to the right hip. EXAM: DG HIP (WITH OR WITHOUT PELVIS) 2-3V RIGHT COMPARISON:  None. FINDINGS: There is a mildly displaced fracture of the right femoral neck with mild proximal migration of the femoral shaft. The bones are osteopenic. There is no dislocation. Mild bilateral hip osteoarthritic changes. The soft tissues are unremarkable. IMPRESSION: Mildly displaced fracture of the right femoral neck. No dislocation. Electronically Signed   By: Anner Crete M.D.    On: 09/12/2020 18:36    Positive ROS: All other systems have been reviewed and were otherwise negative with the exception of those mentioned in the HPI and as above.  Physical Exam: General: Alert, no acute distress Cardiovascular: No pedal edema Respiratory: No cyanosis, no use of accessory musculature GI: No organomegaly, abdomen is soft and non-tender Skin: No lesions in the area of chief complaint Neurologic: Sensation intact distally Psychiatric: Patient is competent for consent with normal mood and affect Lymphatic: No axillary or cervical lymphadenopathy  MUSCULOSKELETAL: Right hip:  Skin intact.  No erythema, ecchymosis or significant swelling.   Right LE is shortened and externally rotated.  Patient has palpable pedal pulses, intact sensation to light touch and intact motor function distallt.  Assessment: Right closed, displaced femoral neck hip fracture  Plan: Patient understands that she has sustained a right hip fracture after her fall today.  I  am recommending a right hip hemiarthroplasty.   I explained the details of the operation and the post-operative course with her.    I also discussed the risks and benefits of surgery with the patient. She understands the risks include but are not limited to infection, bleeding requiring blood transfusion, nerve or blood vessel injury, joint stiffness or loss of motion, persistent pain, weakness or instability, fracture, dislocation and hardware failure and the need for further surgery, including conversion to a right THA. Medical risks include but are not limited to DVT and pulmonary embolism, myocardial infarction, stroke, pneumonia, respiratory failure and death. Patient understood these risks and wished to proceed.   I have reviewed the patients xrays and labs in preparation for this case.  She does not take any anticoagulant.  Patient may eat up until midnight and then will be NPO in preparation for surgery tomorrow.   I have  discontinued the order for lovenox in preparation for surgery tomorrow.   Patient was not getting adequate relief from fentanyl and I have ordered morphine and oxycodone.   Patient can experience nausea from most pain medications but does not have a true allergy.  Zofran is ordered for nausea.   Thornton Park, MD    09/12/2020 10:09 PM

## 2020-09-13 ENCOUNTER — Encounter
Admission: EM | Disposition: A | Discharge status: Skilled Nursing Facility | Payer: Self-pay | Source: Home / Self Care | Attending: Internal Medicine

## 2020-09-13 ENCOUNTER — Inpatient Hospital Stay: Payer: PPO | Admitting: Certified Registered Nurse Anesthetist

## 2020-09-13 ENCOUNTER — Encounter: Payer: Self-pay | Admitting: Certified Registered Nurse Anesthetist

## 2020-09-13 ENCOUNTER — Encounter: Payer: Self-pay | Admitting: Family Medicine

## 2020-09-13 ENCOUNTER — Inpatient Hospital Stay: Payer: PPO

## 2020-09-13 DIAGNOSIS — S72031A Displaced midcervical fracture of right femur, initial encounter for closed fracture: Secondary | ICD-10-CM | POA: Diagnosis not present

## 2020-09-13 HISTORY — PX: HIP ARTHROPLASTY: SHX981

## 2020-09-13 LAB — BASIC METABOLIC PANEL
Anion gap: 8 (ref 5–15)
BUN: 20 mg/dL (ref 8–23)
CO2: 26 mmol/L (ref 22–32)
Calcium: 8.8 mg/dL — ABNORMAL LOW (ref 8.9–10.3)
Chloride: 105 mmol/L (ref 98–111)
Creatinine, Ser: 0.87 mg/dL (ref 0.44–1.00)
GFR, Estimated: >60 mL/min (ref >60)
Glucose, Bld: 119 mg/dL — ABNORMAL HIGH (ref 70–99)
Potassium: 3.7 mmol/L (ref 3.5–5.1)
Sodium: 139 mmol/L (ref 135–145)

## 2020-09-13 LAB — CBC
HCT: 35.8 % — ABNORMAL LOW (ref 36.0–46.0)
HCT: 37.4 % (ref 36.0–46.0)
Hemoglobin: 12 g/dL (ref 12.0–15.0)
Hemoglobin: 12.5 g/dL (ref 12.0–15.0)
MCH: 30.7 pg (ref 26.0–34.0)
MCH: 30.7 pg (ref 26.0–34.0)
MCHC: 33.5 g/dL (ref 30.0–36.0)
MCHC: 33.4 g/dL (ref 30.0–36.0)
MCV: 91.6 fL (ref 80.0–100.0)
MCV: 91.9 fL (ref 80.0–100.0)
Platelets: 169 10*3/uL (ref 150–400)
Platelets: 154 10*3/uL (ref 150–400)
RBC: 3.91 MIL/uL (ref 3.87–5.11)
RBC: 4.07 MIL/uL (ref 3.87–5.11)
RDW: 12.4 % (ref 11.5–15.5)
RDW: 12.2 % (ref 11.5–15.5)
WBC: 10.8 10*3/uL — ABNORMAL HIGH (ref 4.0–10.5)
WBC: 16 10*3/uL — ABNORMAL HIGH (ref 4.0–10.5)
nRBC: 0 % (ref 0.0–0.2)
nRBC: 0 % (ref 0.0–0.2)

## 2020-09-13 LAB — CREATININE, SERUM
Creatinine, Ser: 0.88 mg/dL (ref 0.44–1.00)
GFR, Estimated: >60 mL/min (ref >60)

## 2020-09-13 LAB — TSH: TSH: 0.883 u[IU]/mL (ref 0.350–4.500)

## 2020-09-13 LAB — SURGICAL PCR SCREEN
MRSA, PCR: NEGATIVE
Staphylococcus aureus: NEGATIVE

## 2020-09-13 SURGERY — HEMIARTHROPLASTY, HIP, DIRECT ANTERIOR APPROACH, FOR FRACTURE
Anesthesia: Choice

## 2020-09-13 SURGERY — HEMIARTHROPLASTY, HIP, DIRECT ANTERIOR APPROACH, FOR FRACTURE
Anesthesia: Spinal | Site: Hip | Laterality: Right

## 2020-09-13 MED ORDER — SODIUM CHLORIDE 0.9 % IV SOLN
INTRAVENOUS | Status: DC | PRN
  Administered 2020-09-13: 18:00:00 20 ug/min via INTRAVENOUS

## 2020-09-13 MED ORDER — DOCUSATE SODIUM 100 MG PO CAPS
100.0000 mg | ORAL_CAPSULE | Freq: Two times a day (BID) | ORAL | Status: DC
  Administered 2020-09-13 – 2020-09-18 (×8): 100 mg via ORAL
  Filled 2020-09-13 (×10): qty 1

## 2020-09-13 MED ORDER — ZOLPIDEM TARTRATE 5 MG PO TABS: 5.0000 mg | ORAL_TABLET | Freq: Every evening | ORAL | Status: DC | PRN

## 2020-09-13 MED ORDER — PHENOL 1.4 % MT LIQD
1.0000 | OROMUCOSAL | Status: DC | PRN
  Filled 2020-09-13: qty 177

## 2020-09-13 MED ORDER — BUPIVACAINE-EPINEPHRINE (PF) 0.25% -1:200000 IJ SOLN
INTRAMUSCULAR | Status: DC | PRN
  Administered 2020-09-13: 30 mL

## 2020-09-13 MED ORDER — MIDAZOLAM HCL 2 MG/2ML IJ SOLN
INTRAMUSCULAR | Status: DC | PRN
  Administered 2020-09-13: 1 mg via INTRAVENOUS

## 2020-09-13 MED ORDER — ACETAMINOPHEN 10 MG/ML IV SOLN
INTRAVENOUS | Status: DC | PRN
  Administered 2020-09-13: 1000 mg via INTRAVENOUS

## 2020-09-13 MED ORDER — PROPOFOL 500 MG/50ML IV EMUL
INTRAVENOUS | Status: DC | PRN
  Administered 2020-09-13: 50 ug/kg/min via INTRAVENOUS

## 2020-09-13 MED ORDER — HYDROCODONE-ACETAMINOPHEN 5-325 MG PO TABS
1.0000 | ORAL_TABLET | ORAL | Status: DC | PRN
  Administered 2020-09-14: 1 via ORAL
  Filled 2020-09-13: qty 1

## 2020-09-13 MED ORDER — MENTHOL 3 MG MT LOZG
1.0000 | LOZENGE | OROMUCOSAL | Status: DC | PRN
  Filled 2020-09-13: qty 9

## 2020-09-13 MED ORDER — DEXAMETHASONE SODIUM PHOSPHATE 10 MG/ML IJ SOLN
INTRAMUSCULAR | Status: DC | PRN
  Administered 2020-09-13: 10 mg via INTRAVENOUS

## 2020-09-13 MED ORDER — LIDOCAINE HCL (CARDIAC) PF 100 MG/5ML IV SOSY
PREFILLED_SYRINGE | INTRAVENOUS | Status: DC | PRN
  Administered 2020-09-13: 80 mg via INTRAVENOUS

## 2020-09-13 MED ORDER — FENTANYL CITRATE (PF) 100 MCG/2ML IJ SOLN: 25.0000 ug | INTRAMUSCULAR | Status: DC | PRN

## 2020-09-13 MED ORDER — BISACODYL 10 MG RE SUPP
10.0000 mg | Freq: Every day | RECTAL | Status: DC | PRN
Start: 2020-09-13 — End: 2020-09-18

## 2020-09-13 MED ORDER — FENTANYL CITRATE (PF) 100 MCG/2ML IJ SOLN
INTRAMUSCULAR | Status: DC | PRN
  Administered 2020-09-13: 50 ug via INTRAVENOUS

## 2020-09-13 MED ORDER — GABAPENTIN 300 MG PO CAPS
300.0000 mg | ORAL_CAPSULE | Freq: Three times a day (TID) | ORAL | Status: DC
  Administered 2020-09-13 – 2020-09-16 (×8): 300 mg via ORAL
  Filled 2020-09-13 (×9): qty 1

## 2020-09-13 MED ORDER — METOCLOPRAMIDE HCL 5 MG/ML IJ SOLN
5.0000 mg | Freq: Three times a day (TID) | INTRAMUSCULAR | Status: DC | PRN
Start: 2020-09-13 — End: 2020-09-18

## 2020-09-13 MED ORDER — CALCIUM CARBONATE ANTACID 500 MG PO CHEW
1.0000 | CHEWABLE_TABLET | Freq: Three times a day (TID) | ORAL | Status: DC
  Administered 2020-09-13 – 2020-09-18 (×11): 200 mg via ORAL
  Filled 2020-09-13 (×14): qty 1

## 2020-09-13 MED ORDER — ALUM & MAG HYDROXIDE-SIMETH 200-200-20 MG/5ML PO SUSP: 30.0000 mL | ORAL | Status: DC | PRN

## 2020-09-13 MED ORDER — FLEET ENEMA 7-19 GM/118ML RE ENEM: 1.0000 | ENEMA | Freq: Once | RECTAL | Status: DC | PRN

## 2020-09-13 MED ORDER — ONDANSETRON HCL 4 MG/2ML IJ SOLN
INTRAMUSCULAR | Status: AC
  Filled 2020-09-13: qty 2

## 2020-09-13 MED ORDER — ACETAMINOPHEN 10 MG/ML IV SOLN
INTRAVENOUS | Status: AC
  Filled 2020-09-13: qty 100

## 2020-09-13 MED ORDER — MORPHINE SULFATE (PF) 2 MG/ML IV SOLN
0.5000 mg | INTRAVENOUS | Status: DC | PRN
Start: 2020-09-13 — End: 2020-09-18

## 2020-09-13 MED ORDER — CEFAZOLIN SODIUM-DEXTROSE 2-4 GM/100ML-% IV SOLN
INTRAVENOUS | Status: AC
  Filled 2020-09-13: qty 100

## 2020-09-13 MED ORDER — DEXAMETHASONE SODIUM PHOSPHATE 10 MG/ML IJ SOLN
INTRAMUSCULAR | Status: AC
  Filled 2020-09-13: qty 1

## 2020-09-13 MED ORDER — PROPOFOL 500 MG/50ML IV EMUL
INTRAVENOUS | Status: AC
  Filled 2020-09-13: qty 50

## 2020-09-13 MED ORDER — CHLORHEXIDINE GLUCONATE CLOTH 2 % EX PADS
6.0000 | MEDICATED_PAD | Freq: Every day | CUTANEOUS | Status: DC
  Administered 2020-09-13 – 2020-09-16 (×2): 6 via TOPICAL

## 2020-09-13 MED ORDER — FERROUS SULFATE 325 (65 FE) MG PO TABS
325.0000 mg | ORAL_TABLET | Freq: Every day | ORAL | Status: DC
  Administered 2020-09-14 – 2020-09-18 (×5): 325 mg via ORAL
  Filled 2020-09-13 (×5): qty 1

## 2020-09-13 MED ORDER — SODIUM CHLORIDE 0.45 % IV SOLN: INTRAVENOUS | Status: DC

## 2020-09-13 MED ORDER — METHOCARBAMOL 1000 MG/10ML IJ SOLN
500.0000 mg | Freq: Four times a day (QID) | INTRAVENOUS | Status: DC | PRN
  Filled 2020-09-13: qty 5

## 2020-09-13 MED ORDER — BUPIVACAINE HCL (PF) 0.5 % IJ SOLN
INTRAMUSCULAR | Status: AC
  Filled 2020-09-13: qty 10

## 2020-09-13 MED ORDER — MIDAZOLAM HCL 2 MG/2ML IJ SOLN
INTRAMUSCULAR | Status: AC
  Filled 2020-09-13: qty 2

## 2020-09-13 MED ORDER — ONDANSETRON HCL 4 MG/2ML IJ SOLN: 4.0000 mg | Freq: Once | INTRAMUSCULAR | Status: DC | PRN

## 2020-09-13 MED ORDER — LIDOCAINE HCL 1 % IJ SOLN
INTRAMUSCULAR | Status: DC | PRN
  Administered 2020-09-13: 3 mL

## 2020-09-13 MED ORDER — BUPIVACAINE HCL (PF) 0.5 % IJ SOLN
INTRAMUSCULAR | Status: DC | PRN
  Administered 2020-09-13: 3 mL

## 2020-09-13 MED ORDER — CLINDAMYCIN PHOSPHATE 600 MG/50ML IV SOLN
600.0000 mg | Freq: Four times a day (QID) | INTRAVENOUS | Status: AC
  Administered 2020-09-13 – 2020-09-14 (×2): 600 mg via INTRAVENOUS
  Filled 2020-09-13 (×2): qty 50

## 2020-09-13 MED ORDER — METOCLOPRAMIDE HCL 10 MG PO TABS: 5.0000 mg | ORAL_TABLET | Freq: Three times a day (TID) | ORAL | Status: DC | PRN

## 2020-09-13 MED ORDER — ACETAMINOPHEN 325 MG PO TABS
325.0000 mg | ORAL_TABLET | Freq: Four times a day (QID) | ORAL | Status: DC | PRN
  Administered 2020-09-15 – 2020-09-16 (×2): 650 mg via ORAL
  Filled 2020-09-13 (×2): qty 2

## 2020-09-13 MED ORDER — CLINDAMYCIN PHOSPHATE 600 MG/50ML IV SOLN: 600.0000 mg | Freq: Three times a day (TID) | INTRAVENOUS | Status: DC

## 2020-09-13 MED ORDER — ENOXAPARIN SODIUM 30 MG/0.3ML ~~LOC~~ SOLN
30.0000 mg | SUBCUTANEOUS | Status: DC
  Administered 2020-09-14: 09:00:00 30 mg via SUBCUTANEOUS
  Filled 2020-09-13: qty 0.3

## 2020-09-13 MED ORDER — ONDANSETRON HCL 4 MG/2ML IJ SOLN
INTRAMUSCULAR | Status: DC | PRN
  Administered 2020-09-13: 4 mg via INTRAVENOUS

## 2020-09-13 MED ORDER — METHOCARBAMOL 500 MG PO TABS: 500.0000 mg | ORAL_TABLET | Freq: Four times a day (QID) | ORAL | Status: DC | PRN

## 2020-09-13 MED ORDER — FAMOTIDINE IN NACL 20-0.9 MG/50ML-% IV SOLN
20.0000 mg | Freq: Two times a day (BID) | INTRAVENOUS | Status: DC
  Administered 2020-09-13 – 2020-09-14 (×3): 20 mg via INTRAVENOUS
  Filled 2020-09-13 (×6): qty 50

## 2020-09-13 MED ORDER — FENTANYL CITRATE (PF) 100 MCG/2ML IJ SOLN
INTRAMUSCULAR | Status: AC
  Filled 2020-09-13: qty 2

## 2020-09-13 MED ORDER — CLINDAMYCIN PHOSPHATE 600 MG/50ML IV SOLN
INTRAVENOUS | Status: AC
  Filled 2020-09-13: qty 50

## 2020-09-13 MED ORDER — CLINDAMYCIN PHOSPHATE 600 MG/50ML IV SOLN
600.0000 mg | INTRAVENOUS | Status: AC
  Administered 2020-09-13: 18:00:00 600 mg via INTRAVENOUS
  Filled 2020-09-13: qty 50

## 2020-09-13 MED ORDER — HYDROCODONE-ACETAMINOPHEN 7.5-325 MG PO TABS
1.0000 | ORAL_TABLET | ORAL | Status: DC | PRN
  Administered 2020-09-15: 2 via ORAL
  Filled 2020-09-13: qty 2

## 2020-09-13 SURGICAL SUPPLY — 53 items
BLADE SAGITTAL WIDE XTHICK NO (BLADE) ×4 IMPLANT
BLADE SURG SZ10 CARB STEEL (BLADE) ×3 IMPLANT
BNDG COHESIVE 4X5 TAN STRL (GAUZE/BANDAGES/DRESSINGS) ×3 IMPLANT
CANISTER SUCT 1200ML W/VALVE (MISCELLANEOUS) ×4 IMPLANT
CANISTER SUCT 3000ML PPV (MISCELLANEOUS) ×6 IMPLANT
COVER BACK TABLE REUSABLE LG (DRAPES) ×4 IMPLANT
COVER WAND RF STERILE (DRAPES) ×3 IMPLANT
DRAPE 3/4 80X56 (DRAPES) ×8 IMPLANT
DRAPE INCISE IOBAN 66X60 STRL (DRAPES) ×4 IMPLANT
DRAPE SPLIT 6X30 W/TAPE (DRAPES) ×6 IMPLANT
DRAPE SURG 17X11 SM STRL (DRAPES) ×4 IMPLANT
DRSG OPSITE POSTOP 4X10 (GAUZE/BANDAGES/DRESSINGS) ×4 IMPLANT
DURAPREP 26ML APPLICATOR (WOUND CARE) ×12 IMPLANT
ELECT BLADE 6.5 EXT (BLADE) ×3 IMPLANT
ELECT CAUTERY BLADE 6.4 (BLADE) ×4 IMPLANT
ELECT REM PT RETURN 9FT ADLT (ELECTROSURGICAL) ×3
ELECTRODE REM PT RTRN 9FT ADLT (ELECTROSURGICAL) ×2 IMPLANT
GAUZE SPONGE 4X4 12PLY STRL (GAUZE/BANDAGES/DRESSINGS) ×3 IMPLANT
GAUZE XEROFORM 1X8 LF (GAUZE/BANDAGES/DRESSINGS) ×6 IMPLANT
GLOVE BIOGEL PI IND STRL 9 (GLOVE) ×2 IMPLANT
GLOVE BIOGEL PI INDICATOR 9 (GLOVE) ×2
GLOVE SURG 9.0 ORTHO LTXF (GLOVE) ×8 IMPLANT
GOWN STRL REUS TWL 2XL XL LVL4 (GOWN DISPOSABLE) ×4 IMPLANT
GOWN STRL REUS W/ TWL LRG LVL3 (GOWN DISPOSABLE) ×1 IMPLANT
GOWN STRL REUS W/TWL LRG LVL3 (GOWN DISPOSABLE) ×3
HEMOVAC 400ML (MISCELLANEOUS) ×3
KIT DRAIN HEMOVAC JP 7FR 400ML (MISCELLANEOUS) ×2 IMPLANT
KIT TURNOVER KIT A (KITS) ×4 IMPLANT
MANIFOLD NEPTUNE II (INSTRUMENTS) ×3 IMPLANT
NDL FILTER BLUNT 18X1 1/2 (NEEDLE) ×1 IMPLANT
NDL SAFETY ECLIPSE 18X1.5 (NEEDLE) ×2 IMPLANT
NEEDLE FILTER BLUNT 18X 1/2SAF (NEEDLE) ×2
NEEDLE FILTER BLUNT 18X1 1/2 (NEEDLE) ×1 IMPLANT
NEEDLE HYPO 18GX1.5 SHARP (NEEDLE) ×3
NEEDLE MAYO CATGUT SZ4 (NEEDLE) ×3 IMPLANT
NS IRRIG 1000ML POUR BTL (IV SOLUTION) ×4 IMPLANT
PACK HIP PROSTHESIS (MISCELLANEOUS) ×3 IMPLANT
PILLOW ABDUCTION FOAM SM (MISCELLANEOUS) ×4 IMPLANT
PULSAVAC PLUS IRRIG FAN TIP (DISPOSABLE) ×3
RETRIEVER SUT HEWSON (MISCELLANEOUS) ×3 IMPLANT
SOL .9 NS 3000ML IRR  AL (IV SOLUTION) ×2
SOL .9 NS 3000ML IRR AL (IV SOLUTION) ×1
SOL .9 NS 3000ML IRR UROMATIC (IV SOLUTION) ×2 IMPLANT
STAPLER SKIN PROX 35W (STAPLE) ×4 IMPLANT
SUT TICRON 2-0 30IN 311381 (SUTURE) ×12 IMPLANT
SUT VIC AB 0 CT1 36 (SUTURE) ×3 IMPLANT
SUT VIC AB 2-0 CT2 27 (SUTURE) ×6 IMPLANT
SYR 10ML LL (SYRINGE) ×3 IMPLANT
TAPE MICROFOAM 4IN (TAPE) ×3 IMPLANT
TAPE TRANSPORE STRL 2 31045 (GAUZE/BANDAGES/DRESSINGS) ×3 IMPLANT
TIP BRUSH PULSAVAC PLUS 24.33 (MISCELLANEOUS) ×4 IMPLANT
TIP FAN IRRIG PULSAVAC PLUS (DISPOSABLE) ×2 IMPLANT
TUBE SUCT KAM VAC (TUBING) IMPLANT

## 2020-09-13 SURGICAL SUPPLY — 60 items
APL PRP STRL LF DISP 70% ISPRP (MISCELLANEOUS) ×2
BLADE DEBAKEY 8.0 (BLADE) ×2 IMPLANT
BLADE DEBAKEY 8.0MM (BLADE) ×1
BLADE SAGITTAL WIDE XTHICK NO (BLADE) ×3 IMPLANT
BLADE SURG SZ10 CARB STEEL (BLADE) ×3 IMPLANT
CANISTER SUCT 1200ML W/VALVE (MISCELLANEOUS) ×9 IMPLANT
CHLORAPREP W/TINT 26 (MISCELLANEOUS) ×6 IMPLANT
COVER BACK TABLE REUSABLE LG (DRAPES) ×3 IMPLANT
COVER WAND RF STERILE (DRAPES) ×3 IMPLANT
DRAPE INCISE IOBAN 66X60 STRL (DRAPES) ×6 IMPLANT
DRSG AQUACEL AG ADV 3.5X10 (GAUZE/BANDAGES/DRESSINGS) ×3 IMPLANT
DRSG AQUACEL AG ADV 3.5X14 (GAUZE/BANDAGES/DRESSINGS) ×3 IMPLANT
ELECT BLADE 6.5 EXT (BLADE) ×3 IMPLANT
ELECT CAUTERY BLADE 6.4 (BLADE) ×3 IMPLANT
ELECT REM PT RETURN 9FT ADLT (ELECTROSURGICAL) ×3
ELECTRODE REM PT RTRN 9FT ADLT (ELECTROSURGICAL) ×1 IMPLANT
GAUZE SPONGE 4X4 12PLY STRL (GAUZE/BANDAGES/DRESSINGS) ×3 IMPLANT
GAUZE XEROFORM 1X8 LF (GAUZE/BANDAGES/DRESSINGS) ×6 IMPLANT
GLOVE INDICATOR 8.0 STRL GRN (GLOVE) ×3 IMPLANT
GLOVE SURG ORTHO 8.5 STRL (GLOVE) ×3 IMPLANT
GOWN STRL REUS W/ TWL LRG LVL3 (GOWN DISPOSABLE) ×2 IMPLANT
GOWN STRL REUS W/TWL LRG LVL3 (GOWN DISPOSABLE) ×6
GOWN STRL REUS W/TWL LRG LVL4 (GOWN DISPOSABLE) ×3 IMPLANT
HEAD MODULAR ENDO (Orthopedic Implant) ×3 IMPLANT
HEAD UNPLR 46XMDLR STRL HIP (Orthopedic Implant) IMPLANT
HEMOVAC 400CC 10FR (MISCELLANEOUS) ×3 IMPLANT
IV NS 1000ML (IV SOLUTION) ×3
IV NS 1000ML BAXH (IV SOLUTION) ×1 IMPLANT
KIT TURNOVER KIT A (KITS) ×3 IMPLANT
MANIFOLD NEPTUNE II (INSTRUMENTS) ×3 IMPLANT
NDL FILTER BLUNT 18X1 1/2 (NEEDLE) ×1 IMPLANT
NDL MAYO CATGUT SZ4 TPR NDL (NEEDLE) ×1 IMPLANT
NDL SPNL 18GX3.5 QUINCKE PK (NEEDLE) ×2 IMPLANT
NEEDLE FILTER BLUNT 18X 1/2SAF (NEEDLE) ×2
NEEDLE FILTER BLUNT 18X1 1/2 (NEEDLE) ×1 IMPLANT
NEEDLE MAYO CATGUT SZ4 (NEEDLE) ×3 IMPLANT
NEEDLE SPNL 18GX3.5 QUINCKE PK (NEEDLE) ×6 IMPLANT
NS IRRIG 1000ML POUR BTL (IV SOLUTION) ×3 IMPLANT
PACK HIP PROSTHESIS (MISCELLANEOUS) ×3 IMPLANT
PAD ABD DERMACEA PRESS 5X9 (GAUZE/BANDAGES/DRESSINGS) ×6 IMPLANT
PULSAVAC PLUS IRRIG FAN TIP (DISPOSABLE) ×3
SLEEVE UNITRAX V40 (Orthopedic Implant) ×3 IMPLANT
SLEEVE UNITRAX V40 +4 (Orthopedic Implant) IMPLANT
SOL PREP PVP 2OZ (MISCELLANEOUS) ×3
SOLUTION PREP PVP 2OZ (MISCELLANEOUS) ×1 IMPLANT
STAPLER SKIN PROX 35W (STAPLE) ×3 IMPLANT
STEM HIP 4 127DEG (Stem) ×2 IMPLANT
SUT DVC 2 QUILL PDO  T11 36X36 (SUTURE) ×6
SUT DVC 2 QUILL PDO T11 36X36 (SUTURE) ×2 IMPLANT
SUT QUILL PDO 0 36 36 VIOLET (SUTURE) ×3 IMPLANT
SUT TICRON 2-0 30IN 311381 (SUTURE) ×15 IMPLANT
SYR 10ML LL (SYRINGE) ×3 IMPLANT
SYR 30ML LL (SYRINGE) ×3 IMPLANT
SYR 50ML LL SCALE MARK (SYRINGE) ×3 IMPLANT
SYR BULB IRRIG 60ML STRL (SYRINGE) ×2 IMPLANT
TAPE MICROFOAM 4IN (TAPE) ×3 IMPLANT
TIP FAN IRRIG PULSAVAC PLUS (DISPOSABLE) ×1 IMPLANT
TRAY FOLEY MTR SLVR 16FR STAT (SET/KITS/TRAYS/PACK) ×2 IMPLANT
TUBE SUCT KAM VAC (TUBING) ×3 IMPLANT
WATER STERILE IRR 1000ML POUR (IV SOLUTION) ×3 IMPLANT

## 2020-09-13 NOTE — Progress Notes (Signed)
PROGRESS NOTE    Martha Evans  TMH:[id number] DOB: 03-28-1946 DOA: 09/12/2020 PCP: Birdie Sons, MD   Chief Complaint  Patient presents with  . Hip Injury    Brief Narrative: 13 female with history of breast cancer in 2015 and status post lumpectomy, XRT and tamoxifen x5 years with no recurrence, hypertension, HLD, GERD who sustained a fall at a grocery store and brought to the ED and found to have right femoral neck fracture.  Orthopedic was consulted for operative intervention.  Subjective: C/o rt hip pain and stomach bothering from being NPO Anxious to go to surgery soon   Assessment & Plan:  Right femoral neck fracture, secondary mechanical fall.  Appreciate orthopedic input on board noted plan for operative intervention.  Keep n.p.o., continue pain control PT OT and DVT prophylaxis as per orthopedics.  Going for intermediate risk surgery, with no history of uncontrolled diabetes, CAD CKD CHF and with METS >4, and no invasive preop cardiac evaluation needed at this time.  Breast cancer in 2015 and status post lumpectomy, XRT and tamoxifen x5 years with no recurrence  Hypertension: Blood pressure is controlled.  May need to increase the dose if blood pressure remains poorly controlled despite pain control  HLD: Diet controlled  GERD: Continue PPI  Osteopenia last DEXA scan in 2018 will need work-up for osteoporosis in bone clinic post op.  Nutrition: Diet Order            Diet regular Room service appropriate? Yes; Fluid consistency: Thin  Diet effective now                Body mass index is 30.34 kg/m.  DVT prophylaxis: SCD.  No chemical prophylaxis due to procedure Code Status:   Code Status: Full Code  Family Communication: plan of care discussed with patient at bedside.  Status is: Inpatient  Remains inpatient appropriate because:IV treatments appropriate due to intensity of illness or inability to take PO and Inpatient level of care appropriate due  to severity of illness   Dispo: The patient is from: Home              Anticipated d/c is to: SNF              Anticipated d/c date is:2- 3 days              Patient currently is not medically stable to d/c.  Consultants:see note  Procedures:see note  Culture/Microbiology    Component Value Date/Time   SDES  10/09/2019 0105    URINE, CLEAN CATCH Performed at [facility], [place] [address], [place], [place] [address]    SPECREQUEST  10/09/2019 0105    NONE Performed at [facility], [place] [address] [place], [place] [address]    CULT >=100,000 COLONIES/mL ESCHERICHIA COLI (A) 10/09/2019 0105   REPTSTATUS 10/12/2019 FINAL 10/09/2019 0105    Other culture-see note  Medications: Scheduled Meds: . Chlorhexidine Gluconate Cloth  6 each Topical Q0600  . irbesartan  150 mg Oral Daily  . nitrofurantoin  50 mg Oral QHS  . pantoprazole  40 mg Oral Daily   Continuous Infusions: .  ceFAZolin (ANCEF) IV    . lactated ringers 125 mL/hr at 09/13/20 8144    Antimicrobials: Anti-infectives (From admission, onward)   Start     Dose/Rate Route Frequency Ordered Stop   09/13/20 2200  nitrofurantoin (MACRODANTIN) capsule 50 mg        50 mg Oral Daily  at bedtime 09/12/20 2139     09/13/20 1100  ceFAZolin (ANCEF) IVPB 2g/100 mL premix        2 g 200 mL/hr over 30 Minutes Intravenous 30 min pre-op 09/12/20 2204       Objective: Vitals: Today's Vitals   09/13/20 0026 09/13/20 0430 09/13/20 0519 09/13/20 0740  BP:  (!) 144/78  137/78  Pulse:  79  75  Resp:    19  Temp:  98.4 F (36.9 C)  98.7 F (37.1 C)  TempSrc:  Oral  Oral  SpO2:  95%  96%  Weight:      Height:      PainSc: Asleep  7     No intake or output data in the 24 hours ending 09/13/20 0807 Filed Weights   09/12/20 1625  Weight: 85.3 kg   Weight change:   Intake/Output from previous day: No intake/output data recorded. Intake/Output this shift: No intake/output data  recorded.  Examination: General exam: AAOx3 ,NAD, weak appearing. HEENT:Oral mucosa moist, Ear/Nose WNL grossly,dentition normal. Respiratory system: bilaterally clear,no wheezing or crackles,no use of accessory muscle, non tender. Cardiovascular system: S1 & S2 +, regular, No JVD. Gastrointestinal system: Abdomen soft, NT,ND, BS+. Nervous System:Alert, awake, moving extremities and grossly nonfocal Extremities: tender rt hip, no bruise, no edema, distal peripheral pulses palpable.  Skin: No rashes,no icterus. MSK: Normal muscle bulk,tone, power  Data Reviewed: I have personally reviewed following labs and imaging studies CBC: Recent Labs  Lab 09/12/20 1630 09/13/20 0458  WBC 14.4* 10.8*  NEUTROABS 11.1*  --   HGB 12.6 12.0  HCT 37.0 35.8*  MCV 92.0 91.6  PLT 187 132   Basic Metabolic Panel: Recent Labs  Lab 09/12/20 1630 09/13/20 0458  NA 140 139  K 3.6 3.7  CL 104 105  CO2 26 26  GLUCOSE 101* 119*  BUN 23 20  CREATININE 0.96 0.87  CALCIUM 9.1 8.8*   GFR: Estimated Creatinine Clearance: 63.4 mL/min (by C-G formula based on SCr of 0.87 mg/dL). Liver Function Tests: Recent Labs  Lab 09/12/20 1630  AST 24  ALT 20  ALKPHOS 95  BILITOT 0.7  PROT 7.4  ALBUMIN 4.1   No results for input(s): LIPASE, AMYLASE in the last 168 hours. No results for input(s): AMMONIA in the last 168 hours. Coagulation Profile: Recent Labs  Lab 09/12/20 1630  INR 0.9   Cardiac Enzymes: No results for input(s): CKTOTAL, CKMB, CKMBINDEX, TROPONINI in the last 168 hours. BNP (last 3 results) No results for input(s): PROBNP in the last 8760 hours. HbA1C: No results for input(s): HGBA1C in the last 72 hours. CBG: No results for input(s): GLUCAP in the last 168 hours. Lipid Profile: No results for input(s): CHOL, HDL, LDLCALC, TRIG, CHOLHDL, LDLDIRECT in the last 72 hours. Thyroid Function Tests: Recent Labs    09/12/20 2301  TSH 0.883   Anemia Panel: No results for  input(s): VITAMINB12, FOLATE, FERRITIN, TIBC, IRON, RETICCTPCT in the last 72 hours. Sepsis Labs: No results for input(s): PROCALCITON, LATICACIDVEN in the last 168 hours.  Recent Results (from the past 240 hour(s))  Resp Panel by RT-PCR (Flu A&B, Covid) Nasopharyngeal Swab     Status: None   Collection Time: 09/12/20  4:30 PM   Specimen: Nasopharyngeal Swab; Nasopharyngeal(NP) swabs in vial transport medium  Result Value Ref Range Status   SARS Coronavirus 2 by RT PCR NEGATIVE NEGATIVE Final    Comment: (NOTE) SARS-CoV-2 target nucleic acids are NOT DETECTED.  The SARS-CoV-2 RNA  is generally detectable in upper respiratory specimens during the acute phase of infection. The lowest concentration of SARS-CoV-2 viral copies this assay can detect is 138 copies/mL. A negative result does not preclude SARS-Cov-2 infection and should not be used as the sole basis for treatment or other patient management decisions. A negative result may occur with  improper specimen collection/handling, submission of specimen other than nasopharyngeal swab, presence of viral mutation(s) within the areas targeted by this assay, and inadequate number of viral copies(<138 copies/mL). A negative result must be combined with clinical observations, patient history, and epidemiological information. The expected result is Negative.  Fact Sheet for Patients:  EntrepreneurPulse.com.au  Fact Sheet for Healthcare Providers:  IncredibleEmployment.be  This test is no t yet approved or cleared by the [place] FDA and  has been authorized for detection and/or diagnosis of SARS-CoV-2 by FDA under an Emergency Use Authorization (EUA). This EUA will remain  in effect (meaning this test can be used) for the duration of the COVID-19 declaration under Section 564(b)(1) of the Act, 21 U.S.C.section 360bbb-3(b)(1), unless the authorization is terminated  or revoked sooner.        Influenza A by PCR NEGATIVE NEGATIVE Final   Influenza B by PCR NEGATIVE NEGATIVE Final    Comment: (NOTE) The Xpert Xpress SARS-CoV-2/FLU/RSV plus assay is intended as an aid in the diagnosis of influenza from Nasopharyngeal swab specimens and should not be used as a sole basis for treatment. Nasal washings and aspirates are unacceptable for Xpert Xpress SARS-CoV-2/FLU/RSV testing.  Fact Sheet for Patients: EntrepreneurPulse.com.au  Fact Sheet for Healthcare Providers: IncredibleEmployment.be  This test is not yet approved or cleared by the [place] FDA and has been authorized for detection and/or diagnosis of SARS-CoV-2 by FDA under an Emergency Use Authorization (EUA). This EUA will remain in effect (meaning this test can be used) for the duration of the COVID-19 declaration under Section 564(b)(1) of the Act, 21 U.S.C. section 360bbb-3(b)(1), unless the authorization is terminated or revoked.  Performed at [facility], [address], [place], [place] [address]   Surgical pcr screen     Status: None   Collection Time: 09/13/20  4:40 AM   Specimen: Nasal Mucosa; Nasal Swab  Result Value Ref Range Status   MRSA, PCR NEGATIVE NEGATIVE Final   Staphylococcus aureus NEGATIVE NEGATIVE Final    Comment: (NOTE) The Xpert SA Assay (FDA approved for NASAL specimens in patients 73 years of age and older), is one component of a comprehensive surveillance program. It is not intended to diagnose infection nor to guide or monitor treatment. Performed at [facility], [address], [place], [place] [address]      Radiology Studies: DG Chest 1 View  Result Date: 09/12/2020 CLINICAL DATA:  74 year old female under preoperative evaluation. EXAM: CHEST  1 VIEW COMPARISON:  Chest x-ray 07/09/2017. FINDINGS: Lung volumes are normal. No consolidative airspace disease. No pleural effusions. No pneumothorax. No pulmonary nodule  or mass noted. Pulmonary vasculature and the cardiomediastinal silhouette are within normal limits. Surgical clips project over the right breast likely from prior lumpectomy. Surgical clips also noted in the lower right cervical region, likely from prior hemithyroidectomy. IMPRESSION: 1. No radiographic evidence of acute cardiopulmonary disease. Electronically Signed   By: Vinnie Langton M.D.   On: 09/12/2020 18:33   DG Hip Unilat  With Pelvis 2-3 Views Right  Result Date: 09/12/2020 CLINICAL DATA:  74 year old female with trauma to the right hip. EXAM: DG HIP (WITH OR  WITHOUT PELVIS) 2-3V RIGHT COMPARISON:  None. FINDINGS: There is a mildly displaced fracture of the right femoral neck with mild proximal migration of the femoral shaft. The bones are osteopenic. There is no dislocation. Mild bilateral hip osteoarthritic changes. The soft tissues are unremarkable. IMPRESSION: Mildly displaced fracture of the right femoral neck. No dislocation. Electronically Signed   By: Anner Crete M.D.   On: 09/12/2020 18:36     LOS: 1 day   Antonieta Pert, MD [facility]  09/13/2020, 8:07 AM

## 2020-09-13 NOTE — Progress Notes (Addendum)
Subjective:   Procedure(s) (LRB): ARTHROPLASTY BIPOLAR HIP (HEMIARTHROPLASTY) (Right)   Martha Evans suffered a right hip fracture yesterday while grocery shopping. Dr Mack Guise was to do  her surgery but has been called out of town and is asked me to take over patient's care.  I have spoken to the patient at length and explained the process to her pre and postop plans.  She is in agreement to my taking over her care and wishes to proceed with surgery this afternoon.  Patient reports pain as mild.  Objective:   VITALS:   Vitals:   09/13/20 0740 09/13/20 1150  BP: 137/78 (!) 141/70  Pulse: 75 69  Resp: 19 18  Temp: 98.7 F (37.1 C) 98.6 F (37 C)  SpO2: 96% 94%    Neurologically intact Intact pulses distally Dorsiflexion/Plantar flexion intact  LABS Recent Labs    09/12/20 1630 09/13/20 0458  HGB 12.6 12.0  HCT 37.0 35.8*  WBC 14.4* 10.8*  PLT 187 169    Recent Labs    09/12/20 1630 09/13/20 0458  NA 140 139  K 3.6 3.7  BUN 23 20  CREATININE 0.96 0.87  GLUCOSE 101* 119*    Recent Labs    09/12/20 1630  INR 0.9     Assessment/Plan:   Procedure(s) (LRB): ARTHROPLASTY BIPOLAR HIP (HEMIARTHROPLASTY) (Right)   Plan to do a right hip hemiarthroplasty later this afternoon.  Risks and benefits have been discussed with the patient at length.

## 2020-09-13 NOTE — Progress Notes (Signed)
Subjective:  Patient was seen in her hospital room today.  Patient's pain is improved on morphine overnight.  Objective:   VITALS:   Vitals:   09/13/20 0001 09/13/20 0430 09/13/20 0740 09/13/20 1150  BP: (!) 147/72 (!) 144/78 137/78 (!) 141/70  Pulse: 88 79 75 69  Resp: 17  19 18   Temp: 98.5 F (36.9 C) 98.4 F (36.9 C) 98.7 F (37.1 C) 98.6 F (37 C)  TempSrc:  Oral Oral   SpO2: 93% 95% 96% 94%  Weight:      Height:        PHYSICAL EXAM: Right lower extremity: Patient skin remains intact and there is no erythema ecchymosis or significant swelling.  She remains neurovascular intact. Neurovascular intact Sensation intact distally Intact pulses distally Dorsiflexion/Plantar flexion intact No cellulitis present Compartment soft  LABS  Results for orders placed or performed during the hospital encounter of 09/12/20 (from the past 24 hour(s))  CBC WITH DIFFERENTIAL     Status: Abnormal   Collection Time: 09/12/20  4:30 PM  Result Value Ref Range   WBC 14.4 (H) 4.0 - 10.5 K/uL   RBC 4.02 3.87 - 5.11 MIL/uL   Hemoglobin 12.6 12.0 - 15.0 g/dL   HCT 37.0 36.0 - 46.0 %   MCV 92.0 80.0 - 100.0 fL   MCH 31.3 26.0 - 34.0 pg   MCHC 34.1 30.0 - 36.0 g/dL   RDW 12.4 11.5 - 15.5 %   Platelets 187 150 - 400 K/uL   nRBC 0.0 0.0 - 0.2 %   Neutrophils Relative % 76 %   Neutro Abs 11.1 (H) 1.7 - 7.7 K/uL   Lymphocytes Relative 14 %   Lymphs Abs 2.0 0.7 - 4.0 K/uL   Monocytes Relative 7 %   Monocytes Absolute 1.0 0.1 - 1.0 K/uL   Eosinophils Relative 2 %   Eosinophils Absolute 0.2 0.0 - 0.5 K/uL   Basophils Relative 0 %   Basophils Absolute 0.0 0.0 - 0.1 K/uL   Immature Granulocytes 1 %   Abs Immature Granulocytes 0.09 (H) 0.00 - 0.07 K/uL  Protime-INR     Status: None   Collection Time: 09/12/20  4:30 PM  Result Value Ref Range   Prothrombin Time 12.0 11.4 - 15.2 seconds   INR 0.9 0.8 - 1.2  Type and screen [place]     Status: None   Collection  Time: 09/12/20  4:30 PM  Result Value Ref Range   ABO/RH(D) O POS    Antibody Screen NEG    Sample Expiration      09/15/2020,2359 Performed at [facility], [address] [place], [place] [address]   Comprehensive metabolic panel     Status: Abnormal   Collection Time: 09/12/20  4:30 PM  Result Value Ref Range   Sodium 140 135 - 145 mmol/L   Potassium 3.6 3.5 - 5.1 mmol/L   Chloride 104 98 - 111 mmol/L   CO2 26 22 - 32 mmol/L   Glucose, Bld 101 (H) 70 - 99 mg/dL   BUN 23 8 - 23 mg/dL   Creatinine, Ser 0.96 0.44 - 1.00 mg/dL   Calcium 9.1 8.9 - 10.3 mg/dL   Total Protein 7.4 6.5 - 8.1 g/dL   Albumin 4.1 3.5 - 5.0 g/dL   AST 24 15 - 41 U/L   ALT 20 0 - 44 U/L   Alkaline Phosphatase 95 38 - 126 U/L   Total Bilirubin 0.7 0.3 - 1.2 mg/dL  GFR, Estimated >60 >60 mL/min   Anion gap 10 5 - 15  APTT     Status: None   Collection Time: 09/12/20  4:30 PM  Result Value Ref Range   aPTT 31 24 - 36 seconds  Resp Panel by RT-PCR (Flu A&B, Covid) Nasopharyngeal Swab     Status: None   Collection Time: 09/12/20  4:30 PM   Specimen: Nasopharyngeal Swab; Nasopharyngeal(NP) swabs in vial transport medium  Result Value Ref Range   SARS Coronavirus 2 by RT PCR NEGATIVE NEGATIVE   Influenza A by PCR NEGATIVE NEGATIVE   Influenza B by PCR NEGATIVE NEGATIVE  TSH     Status: None   Collection Time: 09/12/20 11:01 PM  Result Value Ref Range   TSH 0.883 0.350 - 4.500 uIU/mL  Surgical pcr screen     Status: None   Collection Time: 09/13/20  4:40 AM   Specimen: Nasal Mucosa; Nasal Swab  Result Value Ref Range   MRSA, PCR NEGATIVE NEGATIVE   Staphylococcus aureus NEGATIVE NEGATIVE  Basic metabolic panel     Status: Abnormal   Collection Time: 09/13/20  4:58 AM  Result Value Ref Range   Sodium 139 135 - 145 mmol/L   Potassium 3.7 3.5 - 5.1 mmol/L   Chloride 105 98 - 111 mmol/L   CO2 26 22 - 32 mmol/L   Glucose, Bld 119 (H) 70 - 99 mg/dL   BUN 20 8 - 23 mg/dL   Creatinine, Ser  0.87 0.44 - 1.00 mg/dL   Calcium 8.8 (L) 8.9 - 10.3 mg/dL   GFR, Estimated >60 >60 mL/min   Anion gap 8 5 - 15  CBC     Status: Abnormal   Collection Time: 09/13/20  4:58 AM  Result Value Ref Range   WBC 10.8 (H) 4.0 - 10.5 K/uL   RBC 3.91 3.87 - 5.11 MIL/uL   Hemoglobin 12.0 12.0 - 15.0 g/dL   HCT 35.8 (L) 36.0 - 46.0 %   MCV 91.6 80.0 - 100.0 fL   MCH 30.7 26.0 - 34.0 pg   MCHC 33.5 30.0 - 36.0 g/dL   RDW 12.4 11.5 - 15.5 %   Platelets 169 150 - 400 K/uL   nRBC 0.0 0.0 - 0.2 %    DG Chest 1 View  Result Date: 09/12/2020 CLINICAL DATA:  74 year old female under preoperative evaluation. EXAM: CHEST  1 VIEW COMPARISON:  Chest x-ray 07/09/2017. FINDINGS: Lung volumes are normal. No consolidative airspace disease. No pleural effusions. No pneumothorax. No pulmonary nodule or mass noted. Pulmonary vasculature and the cardiomediastinal silhouette are within normal limits. Surgical clips project over the right breast likely from prior lumpectomy. Surgical clips also noted in the lower right cervical region, likely from prior hemithyroidectomy. IMPRESSION: 1. No radiographic evidence of acute cardiopulmonary disease. Electronically Signed   By: Vinnie Langton M.D.   On: 09/12/2020 18:33   DG Hip Unilat  With Pelvis 2-3 Views Right  Result Date: 09/12/2020 CLINICAL DATA:  74 year old female with trauma to the right hip. EXAM: DG HIP (WITH OR WITHOUT PELVIS) 2-3V RIGHT COMPARISON:  None. FINDINGS: There is a mildly displaced fracture of the right femoral neck with mild proximal migration of the femoral shaft. The bones are osteopenic. There is no dislocation. Mild bilateral hip osteoarthritic changes. The soft tissues are unremarkable. IMPRESSION: Mildly displaced fracture of the right femoral neck. No dislocation. Electronically Signed   By: Anner Crete M.D.   On: 09/12/2020 18:36    Assessment/Plan:  Day of Surgery   Principal Problem:   Hip fracture (HCC) Active Problems:    History of thyroid lobectomy, right   Malignant neoplasm of lower-outer quadrant of right breast of female, estrogen receptor positive (HCC)   Acid reflux   Hypercholesteremia   BP (high blood pressure)   Osteopenia  Patient will have a right hip hemiarthroplasty today for her displaced femoral neck hip fracture.  Since I have to leave town for a funeral this afternoon, Dr. Earnestine Leys will take over the care of this patient.  He will perform the surgery at 4:30 PM today.  Patient should remain n.p.o. and should not receive any anticoagulation until after surgery.  I explained this to the patient and she is comfortable with this plan.  She understands Dr. Sabra Heck will take over her care and is ready to have her surgery.    Thornton Park , MD 09/13/2020, 12:04 PM

## 2020-09-13 NOTE — Plan of Care (Signed)
  Problem: Education: Goal: Knowledge of General Education information will improve Description: Including pain rating scale, medication(s)/side effects and non-pharmacologic comfort measures 09/13/2020 1250 by Cristela Blue, RN Outcome: Progressing 09/13/2020 1250 by Cristela Blue, RN Outcome: Progressing   Problem: Health Behavior/Discharge Planning: Goal: Ability to manage health-related needs will improve 09/13/2020 1250 by Cristela Blue, RN Outcome: Progressing 09/13/2020 1250 by Cristela Blue, RN Outcome: Progressing   Problem: Clinical Measurements: Goal: Ability to maintain clinical measurements within normal limits will improve 09/13/2020 1250 by Cristela Blue, RN Outcome: Progressing 09/13/2020 1250 by Cristela Blue, RN Outcome: Progressing Goal: Will remain free from infection 09/13/2020 1250 by Cristela Blue, RN Outcome: Progressing 09/13/2020 1250 by Cristela Blue, RN Outcome: Progressing Goal: Diagnostic test results will improve 09/13/2020 1250 by Cristela Blue, RN Outcome: Progressing 09/13/2020 1250 by Cristela Blue, RN Outcome: Progressing Goal: Respiratory complications will improve 09/13/2020 1250 by Cristela Blue, RN Outcome: Progressing 09/13/2020 1250 by Cristela Blue, RN Outcome: Progressing Goal: Cardiovascular complication will be avoided 09/13/2020 1250 by Cristela Blue, RN Outcome: Progressing 09/13/2020 1250 by Cristela Blue, RN Outcome: Progressing   Problem: Activity: Goal: Risk for activity intolerance will decrease 09/13/2020 1250 by Cristela Blue, RN Outcome: Progressing 09/13/2020 1250 by Cristela Blue, RN Outcome: Progressing   Problem: Nutrition: Goal: Adequate nutrition will be maintained 09/13/2020 1250 by Cristela Blue, RN Outcome: Progressing 09/13/2020 1250 by Cristela Blue, RN Outcome: Progressing   Problem: Coping: Goal: Level of anxiety will decrease 09/13/2020 1250 by Cristela Blue, RN Outcome:  Progressing 09/13/2020 1250 by Cristela Blue, RN Outcome: Progressing   Problem: Elimination: Goal: Will not experience complications related to bowel motility 09/13/2020 1250 by Cristela Blue, RN Outcome: Progressing 09/13/2020 1250 by Cristela Blue, RN Outcome: Progressing Goal: Will not experience complications related to urinary retention 09/13/2020 1250 by Cristela Blue, RN Outcome: Progressing 09/13/2020 1250 by Cristela Blue, RN Outcome: Progressing   Problem: Pain Managment: Goal: General experience of comfort will improve 09/13/2020 1250 by Cristela Blue, RN Outcome: Progressing 09/13/2020 1250 by Cristela Blue, RN Outcome: Progressing   Problem: Safety: Goal: Ability to remain free from injury will improve 09/13/2020 1250 by Cristela Blue, RN Outcome: Progressing 09/13/2020 1250 by Cristela Blue, RN Outcome: Progressing   Problem: Skin Integrity: Goal: Risk for impaired skin integrity will decrease 09/13/2020 1250 by Cristela Blue, RN Outcome: Progressing 09/13/2020 1250 by Cristela Blue, RN Outcome: Progressing

## 2020-09-13 NOTE — H&P (Signed)
THE PATIENT WAS SEEN PRIOR TO SURGERY TODAY.  HISTORY, ALLERGIES, HOME MEDICATIONS AND OPERATIVE PROCEDURE WERE REVIEWED. RISKS AND BENEFITS OF SURGERY DISCUSSED WITH PATIENT AGAIN.  NO CHANGES FROM INITIAL HISTORY AND PHYSICAL NOTED.    

## 2020-09-13 NOTE — Plan of Care (Signed)

## 2020-09-13 NOTE — Anesthesia Preprocedure Evaluation (Signed)
Anesthesia Evaluation  Patient identified by MRN, date of birth, ID band Patient awake    Reviewed: Allergy & Precautions, NPO status , Patient's Chart, lab work & pertinent test results  History of Anesthesia Complications (+) PONV, DIFFICULT AIRWAY and history of anesthetic complications  Airway Mallampati: III       Dental   Pulmonary neg sleep apnea, neg COPD, Not current smoker,           Cardiovascular hypertension, Pt. on medications (-) Past MI and (-) CHF (-) Valvular Problems/Murmurs     Neuro/Psych neg Seizures    GI/Hepatic Neg liver ROS, GERD  Medicated and Controlled,  Endo/Other  neg diabetes  Renal/GU negative Renal ROS     Musculoskeletal   Abdominal   Peds  Hematology   Anesthesia Other Findings   Reproductive/Obstetrics                             Anesthesia Physical Anesthesia Plan  ASA: III  Anesthesia Plan: Spinal   Post-op Pain Management:    Induction:   PONV Risk Score and Plan:   Airway Management Planned:   Additional Equipment:   Intra-op Plan:   Post-operative Plan:   Informed Consent: I have reviewed the patients History and Physical, chart, labs and discussed the procedure including the risks, benefits and alternatives for the proposed anesthesia with the patient or authorized representative who has indicated his/her understanding and acceptance.       Plan Discussed with:   Anesthesia Plan Comments:         Anesthesia Quick Evaluation

## 2020-09-13 NOTE — Anesthesia Procedure Notes (Signed)
Spinal  Patient location during procedure: OR Start time: 09/13/2020 5:00 PM End time: 09/13/2020 5:04 PM Staffing Performed: resident/CRNA  Anesthesiologist: Gunnar Fusi, MD Resident/CRNA: Eben Burow, CRNA Preanesthetic Checklist Completed: patient identified, IV checked, site marked, risks and benefits discussed, surgical consent, monitors and equipment checked and pre-op evaluation Spinal Block Patient position: sitting Prep: ChloraPrep and site prepped and draped Patient monitoring: heart rate, continuous pulse ox and blood pressure Approach: midline Location: L3-4 Injection technique: single-shot Needle Needle type: Pencan and Introducer  Needle gauge: 24 G Needle length: 10 cm Additional Notes Spinal tray expires 10/29/2021

## 2020-09-13 NOTE — Op Note (Addendum)
09/12/2020 - 09/13/2020  7:12 PM  PATIENT:  Martha Evans   MRN: [id number]  PRE-OPERATIVE DIAGNOSIS:  Displaced Subcapital fracture right hip   POST-OPERATIVE DIAGNOSIS: Same  PROCEDURE: Right   hip hemiarthroplasty with Stryker Accolade prosthesis  PREOPERATIVE INDICATIONS:  Martha Evans is an 74 y.o. female who was admitted 09/12/2020 with a diagnosis of displaced subcapital fracture of the hip and elected for surgical management.  The risks benefits and alternatives were discussed with the patient including but not limited to the risks of nonoperative treatment, versus surgical intervention including infection, bleeding, nerve injury, periprosthetic fracture, the need for revision surgery, dislocation, leg length discrepancy, blood clots, cardiopulmonary complications, morbidity, mortality, among others, and they were willing to proceed.  Predicted outcome is good, although there will be at least a six to nine month expected recovery.     SURGEON:  Earnestine Leys, MD  ASST: Roland Rack, PA-C    ANESTHESIA: Spinal    COMPLICATIONS:  None.   EBL: 100 cc    COMPONENTS:  Stryker Accolade Femoral Fracture stem size #4,   and a size   46 mm  fracture head unipolar hip ball with    +4 mm  neck length.    PROCEDURE IN DETAIL: The patient was met in the holding area and identified.  The appropriate hip  was marked at the operative site. The patient was then transported to the OR and  placed under general anesthesia.  At that point, the patient was  placed in the lateral decubitus position with the operative side up and  secured to the operating room table and all bony prominences padded.     The operative lower extremity was prepped from the iliac crest to the toes.  Sterile draping was performed.  Time out was performed prior to incision.      A routine posterolateral approach was utilized via sharp dissection  carried down to the subcutaneous tissue.  Gross bleeders were Bovie   coagulated.  The iliotibial band was identified and incised  along the length of the skin incision.  Self-retaining retractors were  inserted.  With the hip internally rotated, the short external rotators  were identified. The piriformis was tagged and the hip capsule released in a T-type fashion.  The femoral neck was exposed, and I resected the femoral neck using the appropriate jig. This was performed at approximately a thumb's breadth above the lesser trochanter.    I then exposed the deep acetabulum, cleared out any tissue including the ligamentum teres.    I then prepared the proximal femur using the cookie-cutter, the lateralizing reamer, and then sequentially broached.  A trial stem   was  utilized along with a unipolar head and neck.  I reduced the hip and it was found to have excellent stability with functional range of motion. Leg lengths were equal.  The trial components were then removed.   The same size Accolade femoral stem was then inserted and was very stable.  The Unitrax head and neck as trialed were inserted as well.     The hip was then reduced and taken through functional range of motion and found to have excellent stability. Leg lengths were restored.     I closed the T in the capsule with #2 Ticron as well as the short external rotators. A hemovac was inserted.    I then irrigated the hip copiously again with pulse lavage, and repaired the fascia with #2 Quill and the subcutaneous  layer with #0 Quill. Sponge and needle counts were correct. Dry sterile Aquacell was applied.   The patient was then awakened and returned to PACU in stable and satisfactory condition. There were no complications.  Park Breed, MD Orthopedic Surgeon [phone]   09/13/2020 7:12 PM

## 2020-09-13 NOTE — Transfer of Care (Signed)
Immediate Anesthesia Transfer of Care Note  Patient: Martha Evans  Procedure(s) Performed: ARTHROPLASTY BIPOLAR HIP (HEMIARTHROPLASTY) (Right Hip)  Patient Location: PACU  Anesthesia Type:Spinal  Level of Consciousness: awake, alert  and oriented  Airway & Oxygen Therapy: Patient Spontanous Breathing and Patient connected to face mask oxygen  Post-op Assessment: Report given to RN and Post -op Vital signs reviewed and stable  Post vital signs: Reviewed and stable  Last Vitals:  Vitals Value Taken Time  BP    Temp    Pulse    Resp    SpO2      Last Pain:  Vitals:   09/13/20 1554  TempSrc: Oral  PainSc: 3          Complications: No complications documented.

## 2020-09-14 ENCOUNTER — Encounter: Payer: Self-pay | Admitting: Specialist

## 2020-09-14 LAB — CBC
HCT: 33.9 % — ABNORMAL LOW (ref 36.0–46.0)
Hemoglobin: 11.1 g/dL — ABNORMAL LOW (ref 12.0–15.0)
MCH: 30.6 pg (ref 26.0–34.0)
MCHC: 32.7 g/dL (ref 30.0–36.0)
MCV: 93.4 fL (ref 80.0–100.0)
Platelets: 151 10*3/uL (ref 150–400)
RBC: 3.63 MIL/uL — ABNORMAL LOW (ref 3.87–5.11)
RDW: 12.2 % (ref 11.5–15.5)
WBC: 12 10*3/uL — ABNORMAL HIGH (ref 4.0–10.5)
nRBC: 0 % (ref 0.0–0.2)

## 2020-09-14 LAB — BASIC METABOLIC PANEL
Anion gap: 8 (ref 5–15)
BUN: 20 mg/dL (ref 8–23)
CO2: 26 mmol/L (ref 22–32)
Calcium: 8.7 mg/dL — ABNORMAL LOW (ref 8.9–10.3)
Chloride: 106 mmol/L (ref 98–111)
Creatinine, Ser: 0.82 mg/dL (ref 0.44–1.00)
GFR, Estimated: >60 mL/min (ref >60)
Glucose, Bld: 147 mg/dL — ABNORMAL HIGH (ref 70–99)
Potassium: 4 mmol/L (ref 3.5–5.1)
Sodium: 140 mmol/L (ref 135–145)

## 2020-09-14 MED ORDER — ENOXAPARIN SODIUM 40 MG/0.4ML ~~LOC~~ SOLN
40.0000 mg | SUBCUTANEOUS | Status: DC
  Administered 2020-09-15 – 2020-09-18 (×4): 40 mg via SUBCUTANEOUS
  Filled 2020-09-14 (×4): qty 0.4

## 2020-09-14 NOTE — Progress Notes (Signed)
Physical Therapy Treatment Patient Details Name: Martha Evans MRN: [id number] DOB: 03-09-1946 Today's Date: 09/14/2020    History of Present Illness Pt is a 74 y/o F who fell while grocery shopping & found to have R femoral neck fx. Pt underwent posterolateral hip hemiarthroplasty on 09/13/20 by Dr. Sabra Heck. PMH: breast CA (2015), s/p lumpectomy, s/p XRT, HTN, hypercholesterolemia, GERD, cardiomegaly, dysrhythmia, R hearing aide    PT Comments    Pt pleasant & agreeable to tx but mobility limited by vertigo/dizziness in standing. Pt only able to recall 1/3 posterior hip precautions with PT educating her on remaining ones. Pt continues to require cuing and impaired ability to maintain RLE PWB during stand pivot transfers. Pt completes bed mobility with min assist. During session pt performs the following RLE strengthening exercises: LAQ, quad sets, heel slides, short arc quads, hip abduction (AAROM), and hip adduction wedge squeezes. Will continue to follow pt acutely to progress gait with LRAD & for stair training prior to d/c.    Follow Up Recommendations  Home health PT;Supervision for mobility/OOB     Equipment Recommendations  Rolling walker with 5" wheels;3in1 (PT)    Recommendations for Other Services       Precautions / Restrictions Precautions Precautions: Fall;Posterior Hip Precaution Booklet Issued: Yes (comment) Restrictions Weight Bearing Restrictions: Yes RLE Weight Bearing: Partial weight bearing    Mobility  Bed Mobility Overal bed mobility: Needs Assistance Bed Mobility: Sit to Supine     Supine to sit: Mod assist;HOB elevated Sit to supine: Min assist;HOB elevated   General bed mobility comments: assistance to elevate RLE onto bed, cuing for technique & how to maintain posterior hip precautions; pt does well at using RUE to assist RLE onto bed  Transfers Overall transfer level: Needs assistance Equipment used: Rolling walker (2 wheeled) Transfers:  Sit to/from [facility] Sit to Stand: Min assist Stand pivot transfers: Min assist       General transfer comment: cuing for hand placement & to prevent RLE from internally rotating during sit>stand, cuing for sequencing & maintaining PWB RLE during stand pivot but pt with decreased ability to do so  Chief Strategy Officer    Modified Rankin (Stroke Patients Only)       Balance Overall balance assessment: Needs assistance Sitting-balance support: Bilateral upper extremity supported;Feet supported Sitting balance-Leahy Scale: Good Sitting balance - Comments: supervision static sitting balance EOB   Standing balance support: Bilateral upper extremity supported Standing balance-Leahy Scale: Poor Standing balance comment: BUE support on RW during standing                            Cognition Arousal/Alertness: Awake/alert Behavior During Therapy: WFL for tasks assessed/performed Overall Cognitive Status: Within Functional Limits for tasks assessed                                        Exercises     General Comments General comments (skin integrity, edema, etc.): Upon standing pt reports feeling dizzy 2/2 vertigo & reports she'd rather not walk at this time. Pt is able to complete stand pivot with min assist. Pt requesting dramamine to help with vertigo sx (she takes one daily at home) - MD & RN made aware.      Pertinent Vitals/Pain Pain Assessment:  Faces Faces Pain Scale: No hurt    Home Living Family/patient expects to be discharged to:: Private residence Living Arrangements: Spouse/significant other Available Help at Discharge: Family;Available 24 hours/day (pt's husband runs a clock shop out of his home) Type of Home: House Home Access: Stairs to enter Entrance Stairs-Rails: Right Home Layout: One level Home Equipment: None      Prior Function Level of Independence: Independent      Comments:  driving, retired   PT Goals (current goals can now be found in the care plan section) Acute Rehab PT Goals Patient Stated Goal: go home soon PT Goal Formulation: With patient Time For Goal Achievement: 09/28/20 Potential to Achieve Goals: Good Progress towards PT goals: Progressing toward goals    Frequency    BID      PT Plan Current plan remains appropriate    Co-evaluation              AM-PAC PT "6 Clicks" Mobility   Outcome Measure  Help needed turning from your back to your side while in a flat bed without using bedrails?: A Little Help needed moving from lying on your back to sitting on the side of a flat bed without using bedrails?: A Lot Help needed moving to and from a bed to a chair (including a wheelchair)?: A Little Help needed standing up from a chair using your arms (e.g., wheelchair or bedside chair)?: A Little Help needed to walk in hospital room?: A Little Help needed climbing 3-5 steps with a railing? : A Lot 6 Click Score: 16    End of Session Equipment Utilized During Treatment: Gait belt Activity Tolerance: Patient tolerated treatment well Patient left: in bed;with bed alarm set;with call bell/phone within reach;with SCD's reapplied (hip abduction wedge in place) Nurse Communication:  (request for dramamine meds - nurse & MD notified) PT Visit Diagnosis: Unsteadiness on feet (R26.81);Difficulty in walking, not elsewhere classified (R26.2);Muscle weakness (generalized) (M62.81)     Time: 1337-1400 PT Time Calculation (min) (ACUTE ONLY): 23 min  Charges:  $Therapeutic Exercise: 8-22 mins $Therapeutic Activity: 8-22 mins                     Lavone Nian, PT, DPT 09/14/20, 2:27 PM    Waunita Schooner 09/14/2020, 2:25 PM

## 2020-09-14 NOTE — Progress Notes (Addendum)
PROGRESS NOTE    Martha Evans  CXK:[id number] DOB: 1946-01-29 DOA: 09/12/2020 PCP: Birdie Sons, MD   Chief Complaint  Patient presents with  . Hip Injury    Brief Narrative: 74 female with history of breast cancer in 2015 and status post lumpectomy, XRT and tamoxifen x5 years with no recurrence, hypertension, HLD, GERD who sustained a fall at a grocery store and brought to the ED and found to have right femoral neck fracture.  Orthopedic was consulted for operative intervention. Patient underwent  hip hemiarthroplasty by orthopedics 12/16.  Subjective: Pain is controlled.  No GI upset feels much better after surgery.  Waiting to work with physical therapy this morning No acute events overnight. Assessment & Plan:  Right femoral neck fracture, secondary mechanical fall. S/p hip hemiarthroplasty by Dr. Sabra Heck 12/16 appreciate orthopedic input.  Continue Lovenox, IV morphine/oxycodone for pain control.  PT OT evaluation for disposition today.   Breast cancer in 2015 and status post lumpectomy, XRT and tamoxifen x5 years with no recurrence- stable  Hypertension: controlled on Home irbesartan.  HLD: Diet controlled  GERD:cont PPI  Osteopenia last DEXA scan in 2018 will need work-up for osteoporosis in bone clinic post op.  Leukocytosis likely reactive in the setting of hip fracture and surgery.  No fever.  Obesity with BMI 30.2.  Outpatient PCP follow-up and weight loss  Nutrition: Diet Order            Diet regular Room service appropriate? Yes; Fluid consistency: Thin  Diet effective now                Body mass index is 30.25 kg/m.  DVT prophylaxis: enoxaparin (LOVENOX) injection 30 mg Start: 09/14/20 0800 SCDs Start: 09/13/20 2055SCD.  No chemical prophylaxis due to procedure Code Status:   Code Status: Full Code  Family Communication: plan of care discussed with patient at bedside.  Status is: Inpatient  Remains inpatient appropriate because:IV  treatments appropriate due to intensity of illness or inability to take PO and Inpatient level of care appropriate due to severity of illness   Dispo: The patient is from: Home              Anticipated d/c is to: SNF vs HH- TBD              Anticipated d/c date is:1-2 days              Patient currently is not medically stable to d/c.  Consultants:see note  Procedures:see note  Culture/Microbiology    Component Value Date/Time   SDES  10/09/2019 0105    URINE, CLEAN CATCH Performed at [facility], [place] [address] [place], [place] [address]    SPECREQUEST  10/09/2019 0105    NONE Performed at [facility], [place] [address], [place], [place] [address]    CULT >=100,000 COLONIES/mL ESCHERICHIA COLI (A) 10/09/2019 0105   REPTSTATUS 10/12/2019 FINAL 10/09/2019 0105    Other culture-see note  Medications: Scheduled Meds: . calcium carbonate  1 tablet Oral TID  . Chlorhexidine Gluconate Cloth  6 each Topical Q0600  . docusate sodium  100 mg Oral BID  . enoxaparin (LOVENOX) injection  30 mg Subcutaneous Q24H  . ferrous sulfate  325 mg Oral Q breakfast  . gabapentin  300 mg Oral TID  . irbesartan  150 mg Oral Daily  . nitrofurantoin  50 mg Oral QHS   Continuous Infusions: . sodium chloride 75 mL/hr at 09/14/20 0427  .  famotidine (PEPCID) IV Stopped (09/13/20 2158)  . lactated ringers 125 mL/hr at 09/13/20 1652  . methocarbamol (ROBAXIN) IV      Antimicrobials: Anti-infectives (From admission, onward)   Start     Dose/Rate Route Frequency Ordered Stop   09/14/20 0600  clindamycin (CLEOCIN) IVPB 600 mg        600 mg 100 mL/hr over 30 Minutes Intravenous On call to O.R. 09/13/20 1245 09/13/20 1742   09/14/20 0000  clindamycin (CLEOCIN) IVPB 600 mg        600 mg 100 mL/hr over 30 Minutes Intravenous Every 6 hours 09/13/20 2054 09/14/20 0641   09/13/20 2200  nitrofurantoin (MACRODANTIN) capsule 50 mg        50 mg Oral Daily at bedtime  09/12/20 2139     09/13/20 2200  clindamycin (CLEOCIN) IVPB 600 mg  Status:  Discontinued        600 mg 100 mL/hr over 30 Minutes Intravenous Every 8 hours 09/13/20 2054 09/13/20 2104   09/13/20 1729  clindamycin (CLEOCIN) 600 MG/50ML IVPB       Note to Pharmacy: Trudie Reed   : cabinet override      09/13/20 1729 09/13/20 1732   09/13/20 1620  ceFAZolin (ANCEF) 2-4 GM/100ML-% IVPB       Note to Pharmacy: Myles Lipps   : cabinet override      09/13/20 1620 09/13/20 1712   09/13/20 1100  ceFAZolin (ANCEF) IVPB 2g/100 mL premix        2 g 200 mL/hr over 30 Minutes Intravenous 30 min pre-op 09/12/20 2204 09/13/20 1716     Objective: Vitals: Today's Vitals   09/13/20 2246 09/13/20 2359 09/14/20 0509 09/14/20 0750  BP: (!) 116/55 128/68 117/61 121/61  Pulse: 61 71 66 77  Resp:   18 16  Temp: 98.1 F (36.7 C)  98 F (36.7 C) 98.3 F (36.8 C)  TempSrc: Oral   Oral  SpO2: 98% 96% 94% 94%  Weight:      Height:      PainSc:        Intake/Output Summary (Last 24 hours) at 09/14/2020 0753 Last data filed at 09/14/2020 0427 Gross per 24 hour  Intake 1629.29 ml  Output 1850 ml  Net -220.71 ml   Filed Weights   09/12/20 1625 09/13/20 1554  Weight: 85.3 kg 85 kg   Weight change: -0.276 kg  Intake/Output from previous day: 12/16 0701 - 12/17 0700 In: 1629.3 [I.V.:1129.3; IV Piggyback:500] Out: 5188 [Urine:1750; Blood:100] Intake/Output this shift: No intake/output data recorded.  Examination: General exam: AAOx3 , NAD, weak appearing. HEENT:Oral mucosa moist, Ear/Nose WNL grossly, dentition normal. Respiratory system: bilaterally clear,no wheezing or crackles,no use of accessory muscle Cardiovascular system: S1 & S2 +, No JVD,. Gastrointestinal system: Abdomen soft, NT,ND, BS+ Nervous System:Alert, awake, moving extremities and grossly nonfocal Extremities: Right hip with surgical dressing intact, no edema, distal peripheral pulses palpable.  Skin: No rashes,no  icterus. MSK: Normal muscle bulk,tone, power  Data Reviewed: I have personally reviewed following labs and imaging studies CBC: Recent Labs  Lab 09/12/20 1630 09/13/20 0458 09/13/20 2121 09/14/20 0527  WBC 14.4* 10.8* 16.0* 12.0*  NEUTROABS 11.1*  --   --   --   HGB 12.6 12.0 12.5 11.1*  HCT 37.0 35.8* 37.4 33.9*  MCV 92.0 91.6 91.9 93.4  PLT 187 169 154 416   Basic Metabolic Panel: Recent Labs  Lab 09/12/20 1630 09/13/20 0458 09/13/20 2121 09/14/20 0527  NA 140 139  --  140  K 3.6 3.7  --  4.0  CL 104 105  --  106  CO2 26 26  --  26  GLUCOSE 101* 119*  --  147*  BUN 23 20  --  20  CREATININE 0.96 0.87 0.88 0.82  CALCIUM 9.1 8.8*  --  8.7*   GFR: Estimated Creatinine Clearance: 67.1 mL/min (by C-G formula based on SCr of 0.82 mg/dL). Liver Function Tests: Recent Labs  Lab 09/12/20 1630  AST 24  ALT 20  ALKPHOS 95  BILITOT 0.7  PROT 7.4  ALBUMIN 4.1   No results for input(s): LIPASE, AMYLASE in the last 168 hours. No results for input(s): AMMONIA in the last 168 hours. Coagulation Profile: Recent Labs  Lab 09/12/20 1630  INR 0.9   Cardiac Enzymes: No results for input(s): CKTOTAL, CKMB, CKMBINDEX, TROPONINI in the last 168 hours. BNP (last 3 results) No results for input(s): PROBNP in the last 8760 hours. HbA1C: No results for input(s): HGBA1C in the last 72 hours. CBG: No results for input(s): GLUCAP in the last 168 hours. Lipid Profile: No results for input(s): CHOL, HDL, LDLCALC, TRIG, CHOLHDL, LDLDIRECT in the last 72 hours. Thyroid Function Tests: Recent Labs    09/12/20 2301  TSH 0.883   Anemia Panel: No results for input(s): VITAMINB12, FOLATE, FERRITIN, TIBC, IRON, RETICCTPCT in the last 72 hours. Sepsis Labs: No results for input(s): PROCALCITON, LATICACIDVEN in the last 168 hours.  Recent Results (from the past 240 hour(s))  Resp Panel by RT-PCR (Flu A&B, Covid) Nasopharyngeal Swab     Status: None   Collection Time: 09/12/20   4:30 PM   Specimen: Nasopharyngeal Swab; Nasopharyngeal(NP) swabs in vial transport medium  Result Value Ref Range Status   SARS Coronavirus 2 by RT PCR NEGATIVE NEGATIVE Final    Comment: (NOTE) SARS-CoV-2 target nucleic acids are NOT DETECTED.  The SARS-CoV-2 RNA is generally detectable in upper respiratory specimens during the acute phase of infection. The lowest concentration of SARS-CoV-2 viral copies this assay can detect is 138 copies/mL. A negative result does not preclude SARS-Cov-2 infection and should not be used as the sole basis for treatment or other patient management decisions. A negative result may occur with  improper specimen collection/handling, submission of specimen other than nasopharyngeal swab, presence of viral mutation(s) within the areas targeted by this assay, and inadequate number of viral copies(<138 copies/mL). A negative result must be combined with clinical observations, patient history, and epidemiological information. The expected result is Negative.  Fact Sheet for Patients:  EntrepreneurPulse.com.au  Fact Sheet for Healthcare Providers:  IncredibleEmployment.be  This test is no t yet approved or cleared by the [place] FDA and  has been authorized for detection and/or diagnosis of SARS-CoV-2 by FDA under an Emergency Use Authorization (EUA). This EUA will remain  in effect (meaning this test can be used) for the duration of the COVID-19 declaration under Section 564(b)(1) of the Act, 21 U.S.C.section 360bbb-3(b)(1), unless the authorization is terminated  or revoked sooner.       Influenza A by PCR NEGATIVE NEGATIVE Final   Influenza B by PCR NEGATIVE NEGATIVE Final    Comment: (NOTE) The Xpert Xpress SARS-CoV-2/FLU/RSV plus assay is intended as an aid in the diagnosis of influenza from Nasopharyngeal swab specimens and should not be used as a sole basis for treatment. Nasal washings and aspirates  are unacceptable for Xpert Xpress SARS-CoV-2/FLU/RSV testing.  Fact Sheet for Patients: EntrepreneurPulse.com.au  Fact Sheet for Healthcare Providers: IncredibleEmployment.be  This test is not yet approved or cleared by the [place] and has been authorized for detection and/or diagnosis of SARS-CoV-2 by FDA under an Emergency Use Authorization (EUA). This EUA will remain in effect (meaning this test can be used) for the duration of the COVID-19 declaration under Section 564(b)(1) of the Act, 21 U.S.C. section 360bbb-3(b)(1), unless the authorization is terminated or revoked.  Performed at [facility], [address] [place], [place] [address]   Surgical pcr screen     Status: None   Collection Time: 09/13/20  4:40 AM   Specimen: Nasal Mucosa; Nasal Swab  Result Value Ref Range Status   MRSA, PCR NEGATIVE NEGATIVE Final   Staphylococcus aureus NEGATIVE NEGATIVE Final    Comment: (NOTE) The Xpert SA Assay (FDA approved for NASAL specimens in patients 45 years of age and older), is one component of a comprehensive surveillance program. It is not intended to diagnose infection nor to guide or monitor treatment. Performed at [facility], [address] [place], [place] [address]      Radiology Studies: DG Chest 1 View  Result Date: 09/12/2020 CLINICAL DATA:  74 year old female under preoperative evaluation. EXAM: CHEST  1 VIEW COMPARISON:  Chest x-ray 07/09/2017. FINDINGS: Lung volumes are normal. No consolidative airspace disease. No pleural effusions. No pneumothorax. No pulmonary nodule or mass noted. Pulmonary vasculature and the cardiomediastinal silhouette are within normal limits. Surgical clips project over the right breast likely from prior lumpectomy. Surgical clips also noted in the lower right cervical region, likely from prior hemithyroidectomy. IMPRESSION: 1. No radiographic evidence of acute  cardiopulmonary disease. Electronically Signed   By: Vinnie Langton M.D.   On: 09/12/2020 18:33   DG Hip Port Unilat With Pelvis 1V Right  Result Date: 09/13/2020 CLINICAL DATA:  Post hemiarthroplasty EXAM: DG HIP (WITH OR WITHOUT PELVIS) 1V PORT RIGHT COMPARISON:  09/12/2020 FINDINGS: RIGHT hip hemiarthroplasty. No acute fracture dislocation. Prosthetic component appears well position. IMPRESSION: No complication following RIGHT hip hemiarthroplasty Electronically Signed   By: Suzy Bouchard M.D.   On: 09/13/2020 20:05   DG Hip Unilat  With Pelvis 2-3 Views Right  Result Date: 09/12/2020 CLINICAL DATA:  74 year old female with trauma to the right hip. EXAM: DG HIP (WITH OR WITHOUT PELVIS) 2-3V RIGHT COMPARISON:  None. FINDINGS: There is a mildly displaced fracture of the right femoral neck with mild proximal migration of the femoral shaft. The bones are osteopenic. There is no dislocation. Mild bilateral hip osteoarthritic changes. The soft tissues are unremarkable. IMPRESSION: Mildly displaced fracture of the right femoral neck. No dislocation. Electronically Signed   By: Anner Crete M.D.   On: 09/12/2020 18:36     LOS: 2 days   Antonieta Pert, MD [facility]  09/14/2020, 7:53 AM

## 2020-09-14 NOTE — Anesthesia Postprocedure Evaluation (Signed)
Anesthesia Post Note  Patient: Rahi Chandonnet  Procedure(s) Performed: ARTHROPLASTY BIPOLAR HIP (HEMIARTHROPLASTY) (Right Hip)  Patient location during evaluation: Nursing Unit Anesthesia Type: Spinal Level of consciousness: oriented and awake and alert Pain management: pain level controlled Vital Signs Assessment: post-procedure vital signs reviewed and stable Respiratory status: spontaneous breathing and respiratory function stable Cardiovascular status: blood pressure returned to baseline and stable Postop Assessment: no headache, no backache, no apparent nausea or vomiting and patient able to bend at knees Anesthetic complications: no   No complications documented.   Last Vitals:  Vitals:   09/14/20 0509 09/14/20 0750  BP: 117/61 121/61  Pulse: 66 77  Resp: 18 16  Temp: 36.7 C 36.8 C  SpO2: 94% 94%    Last Pain:  Vitals:   09/14/20 0756  TempSrc:   PainSc: 4     LLE Motor Response: Responds to commands (09/14/20 0756) LLE Sensation: Decreased;Pain (09/14/20 0756) RLE Motor Response: Responds to commands (09/14/20 0756) RLE Sensation: Decreased (09/14/20 0756)      Alison Stalling

## 2020-09-14 NOTE — Progress Notes (Signed)
PHARMACIST - PHYSICIAN COMMUNICATION  CONCERNING:  Enoxaparin (Lovenox) for DVT Prophylaxis    RECOMMENDATION: Patient was prescribed enoxaprin 30mg  q24 hours for VTE prophylaxis.   Filed Weights   09/12/20 1625 09/13/20 1554  Weight: 85.3 kg (188 lb) 85 kg (187 lb 6.3 oz)    Body mass index is 30.25 kg/m.  Estimated Creatinine Clearance: 67.1 mL/min (by C-G formula based on SCr of 0.82 mg/dL).   Patient is candidate for enoxaparin 40mg  every 24 hours based on CrCl >37ml/min or Weight >45kg  DESCRIPTION: Pharmacy has adjusted enoxaparin dose per [facility] policy.  Patient is now receiving enoxaparin 40 mg every 24 hours    Rowland Lathe, PharmD Clinical Pharmacist  09/14/2020 4:06 PM

## 2020-09-14 NOTE — Progress Notes (Signed)
Subjective: 1 Day Post-Op Procedure(s) (LRB): ARTHROPLASTY BIPOLAR HIP (HEMIARTHROPLASTY) (Right) Out of bed in a chair and doing well.  Minimal pain.  Started PT this AM.  Dressing with moderate drainage.  Hemoglobin remained stable.  Patient reports pain as mild.  Objective:   VITALS:   Vitals:   09/14/20 0750 09/14/20 1114  BP: 121/61 116/74  Pulse: 77 87  Resp: 16 16  Temp: 98.3 F (36.8 C) 98.3 F (36.8 C)  SpO2: 94% 93%    Neurologically intact Intact pulses distally Dorsiflexion/Plantar flexion intact Incision: moderate drainage  LABS Recent Labs    09/13/20 0458 09/13/20 2121 09/14/20 0527  HGB 12.0 12.5 11.1*  HCT 35.8* 37.4 33.9*  WBC 10.8* 16.0* 12.0*  PLT 169 154 151    Recent Labs    09/12/20 1630 09/13/20 0458 09/13/20 2121 09/14/20 0527  NA 140 139  --  140  K 3.6 3.7  --  4.0  BUN 23 20  --  20  CREATININE 0.96 0.87 0.88 0.82  GLUCOSE 101* 119*  --  147*    Recent Labs    09/12/20 1630  INR 0.9     Assessment/Plan: 1 Day Post-Op Procedure(s) (LRB): ARTHROPLASTY BIPOLAR HIP (HEMIARTHROPLASTY) (Right)   Advance diet Up with therapy D/C IV fluids Discharge home with home health in 2 to 3 days.  We will go home on an aspirin twice a day for 6 weeks Partial weightbearing right leg Return to clinic 2 weeks for x-rays and staple removal

## 2020-09-14 NOTE — Evaluation (Signed)
Physical Therapy Evaluation Patient Details Name: Martha Evans MRN: [id number] DOB: 1945/11/03 Today's Date: 09/14/2020   History of Present Illness  Pt is a 74 y/o F who fell while grocery shopping & found to have R femoral neck fx. Pt underwent posterolateral hip hemiarthroplasty on 09/13/20 by Dr. Sabra Heck. PMH: breast CA (2015), s/p lumpectomy, s/p XRT, HTN, hypercholesterolemia, GERD, cardiomegaly, dysrhythmia, R hearing aide  Clinical Impression  Pt very pleasant & agreeable to tx. PT educates pt on PWB RLE & posterior hip precautions but pt requires max cuing throughout functional mobility to maintain precautions. Provided pt with packet re: hip precautions & LE strengthening exercises (exercises noted below). Pt with good strength in RLE & able to ambulate 4 ft + 69ft in room but gait distance is limited by dizziness (pt with hx of vertigo). Will continue to follow pt acutely to progress gait & for stair training prior to d/c.     Follow Up Recommendations Home health PT;Supervision for mobility/OOB    Equipment Recommendations  Rolling walker with 5" wheels;3in1 (PT)    Recommendations for Other Services       Precautions / Restrictions Precautions Precautions: Fall;Posterior Hip Precaution Booklet Issued: Yes (comment) Restrictions Weight Bearing Restrictions: Yes RLE Weight Bearing: Partial weight bearing      Mobility  Bed Mobility Overal bed mobility: Needs Assistance Bed Mobility: Supine to Sit     Supine to sit: Mod assist;HOB elevated     General bed mobility comments: MAX cuing for technique to allow pt to maintain R posterior hip precautions, assistance to upright trunk    Transfers Overall transfer level: Needs assistance   Transfers: Sit to/from Stand;Stand Pivot Transfers Sit to Stand: Min assist Stand pivot transfers: Min assist       General transfer comment: cuing for hand placement, RLE positioning to maintain posterior hip precautions,  cuing for hand placement on RW  Ambulation/Gait Ambulation/Gait assistance: Min assist Gait Distance (Feet):  (4 ft forwards + 4 ft backwards) Assistive device: Rolling walker (2 wheeled) Gait Pattern/deviations: Decreased step length - left;Decreased step length - right;Decreased stride length Gait velocity: decreased   General Gait Details: instruction for gait pattern for PWB RLE but unsure of how adequately pt is able to maintain this  Science writer    Modified Rankin (Stroke Patients Only)       Balance Overall balance assessment: Needs assistance Sitting-balance support: Bilateral upper extremity supported;Feet supported Sitting balance-Leahy Scale: Good Sitting balance - Comments: supervision static sitting balance EOB   Standing balance support: Bilateral upper extremity supported Standing balance-Leahy Scale: Poor Standing balance comment: BUE support on RW during standing                             Pertinent Vitals/Pain Pain Assessment: Faces Faces Pain Scale: No hurt    Home Living Family/patient expects to be discharged to:: Private residence Living Arrangements: Spouse/significant other Available Help at Discharge: Family;Available 24 hours/day (pt's husband runs a clock shop out of his home) Type of Home: House Home Access: Stairs to enter Entrance Stairs-Rails: Right Entrance Stairs-Number of Steps: 4 (at garage) Home Layout: One level Home Equipment: None      Prior Function Level of Independence: Independent         Comments: driving, retired     Journalist, newspaper        Extremity/Trunk Assessment  Upper Extremity Assessment Upper Extremity Assessment: Overall WFL for tasks assessed    Lower Extremity Assessment Lower Extremity Assessment:  (RLE generalized weakness but 3/5 LAQ (formal MMT not completed))       Communication   Communication:  (per chart pt has R hearing aid)  Cognition  Arousal/Alertness: Awake/alert Behavior During Therapy: WFL for tasks assessed/performed Overall Cognitive Status: Within Functional Limits for tasks assessed                                        General Comments General comments (skin integrity, edema, etc.): MAX education for technique during mobility to maintain posterior hip precautions; pt notes hx of vertigo & taking dramamine daily but not while here in hospital - pt with c/o dizziness after ambulating 18ft + 69ft & requires seated rest break BP = 116/100 mmHg (LUE), nurse notified). SPO2 >90% throughout session on room air.    Exercises Total Joint Exercises Ankle Circles/Pumps: 15 reps;Right;Supine;AROM Quad Sets: AROM;Right;15 reps;Supine Gluteal Sets: AROM;Right;Supine;10 reps Towel Squeeze: AROM;Both;Strengthening;15 reps;Supine (pt squeezed hip adduction pillow/wedge in order to maintain hip precautions) Short Arc Quad: AROM;Right;15 reps;Supine Heel Slides: AROM;Right;15 reps;Supine Hip ABduction/ADduction: AROM;Right;AAROM;15 reps;Supine Long Arc Quad: AROM;Right;15 reps;Seated   Assessment/Plan    PT Assessment Patient needs continued PT services  PT Problem List Decreased strength;Decreased balance;Decreased knowledge of precautions;Decreased mobility;Decreased knowledge of use of DME;Decreased activity tolerance       PT Treatment Interventions DME instruction;Functional mobility training;Balance training;Patient/family education;Modalities;Gait training;Therapeutic activities;Neuromuscular re-education;Manual techniques;Therapeutic exercise;Stair training    PT Goals (Current goals can be found in the Care Plan section)  Acute Rehab PT Goals Patient Stated Goal: go home soon PT Goal Formulation: With patient Time For Goal Achievement: 09/28/20 Potential to Achieve Goals: Good    Frequency BID   Barriers to discharge   4 steps with 1 rail to enter home    Co-evaluation                AM-PAC PT "6 Clicks" Mobility  Outcome Measure Help needed turning from your back to your side while in a flat bed without using bedrails?: A Little Help needed moving from lying on your back to sitting on the side of a flat bed without using bedrails?: A Lot Help needed moving to and from a bed to a chair (including a wheelchair)?: A Little Help needed standing up from a chair using your arms (e.g., wheelchair or bedside chair)?: A Little Help needed to walk in hospital room?: A Little Help needed climbing 3-5 steps with a railing? : A Lot 6 Click Score: 16    End of Session Equipment Utilized During Treatment: Gait belt Activity Tolerance: Patient tolerated treatment well Patient left: in chair;with chair alarm set;with call bell/phone within reach;with SCD's reapplied (hip adduction wedge between BLE) Nurse Communication: Mobility status;Weight bearing status;Precautions PT Visit Diagnosis: Unsteadiness on feet (R26.81);Difficulty in walking, not elsewhere classified (R26.2);Muscle weakness (generalized) (M62.81)    Time: 5053-9767 PT Time Calculation (min) (ACUTE ONLY): 39 min   Charges:   PT Evaluation $PT Eval Low Complexity: 1 Low PT Treatments $Therapeutic Exercise: 8-22 mins $Therapeutic Activity: 8-22 mins        Lavone Nian, PT, DPT 09/14/20, 11:14 AM   Waunita Schooner 09/14/2020, 11:13 AM

## 2020-09-14 NOTE — Care Management Important Message (Signed)
Important Message  Patient Details  Name: Martha Evans MRN: [id number] Date of Birth: 09-18-1946   Medicare Important Message Given:  N/A - LOS <3 / Initial given by admissions     Juliann Pulse A Laquiesha Piacente 09/14/2020, 8:05 AM

## 2020-09-15 LAB — CBC
HCT: 30.9 % — ABNORMAL LOW (ref 36.0–46.0)
Hemoglobin: 10.4 g/dL — ABNORMAL LOW (ref 12.0–15.0)
MCH: 31.4 pg (ref 26.0–34.0)
MCHC: 33.7 g/dL (ref 30.0–36.0)
MCV: 93.4 fL (ref 80.0–100.0)
Platelets: 148 10*3/uL — ABNORMAL LOW (ref 150–400)
RBC: 3.31 MIL/uL — ABNORMAL LOW (ref 3.87–5.11)
RDW: 12.7 % (ref 11.5–15.5)
WBC: 10.5 10*3/uL (ref 4.0–10.5)
nRBC: 0 % (ref 0.0–0.2)

## 2020-09-15 LAB — BASIC METABOLIC PANEL
Anion gap: 8 (ref 5–15)
BUN: 28 mg/dL — ABNORMAL HIGH (ref 8–23)
CO2: 26 mmol/L (ref 22–32)
Calcium: 8.2 mg/dL — ABNORMAL LOW (ref 8.9–10.3)
Chloride: 104 mmol/L (ref 98–111)
Creatinine, Ser: 0.83 mg/dL (ref 0.44–1.00)
GFR, Estimated: >60 mL/min (ref >60)
Glucose, Bld: 118 mg/dL — ABNORMAL HIGH (ref 70–99)
Potassium: 3.8 mmol/L (ref 3.5–5.1)
Sodium: 138 mmol/L (ref 135–145)

## 2020-09-15 MED ORDER — SODIUM CHLORIDE 0.9 % IV SOLN: INTRAVENOUS | Status: DC

## 2020-09-15 MED ORDER — FAMOTIDINE 20 MG PO TABS
20.0000 mg | ORAL_TABLET | Freq: Two times a day (BID) | ORAL | Status: DC
  Administered 2020-09-15 – 2020-09-18 (×5): 20 mg via ORAL
  Filled 2020-09-15 (×6): qty 1

## 2020-09-15 NOTE — Progress Notes (Signed)
PHARMACIST - PHYSICIAN COMMUNICATION   CONCERNING: IV to Oral Route Change Policy  RECOMMENDATION: This patient is receiving famotidine by the intravenous route.  Based on criteria approved by the Pharmacy and Therapeutics Committee, the intravenous medication(s) is/are being converted to the equivalent oral dose form(s).   DESCRIPTION: These criteria include:  The patient is eating (either orally or via tube) and/or has been taking other orally administered medications for a least 24 hours  The patient has no evidence of active gastrointestinal bleeding or impaired GI absorption (gastrectomy, short bowel, patient on TNA or NPO).  If you have questions about this conversion, please contact the [place], PharmD, BCPS Clinical Pharmacist 09/15/2020 10:32 AM

## 2020-09-15 NOTE — Progress Notes (Signed)
Physical Therapy Treatment Patient Details Name: Martha Evans MRN: [id number] DOB: [birth year] Today's Date: 09/15/2020    History of Present Illness Pt is a 74 y/o F who fell while grocery shopping & found to have R femoral neck fx. Pt underwent posterolateral hip hemiarthroplasty on 09/13/20 by Dr. Sabra Heck. PMH: breast CA (2015), s/p lumpectomy, s/p XRT, HTN, hypercholesterolemia, GERD, cardiomegaly, dysrhythmia, R hearing aide    PT Comments    Pt pleasant & agreeable to tx. Pt able to recall 2/3 hip precautions with PT educating her on 3rd. Pt c/o drowsiness suspect due to pain meds & pt clearly drowsy during PT session - nurse made aware. Pt does experience LOB during sit>stand and when standing for ~1 minute to attempt to get BP reading. Pt requires min assist for sit>supine. PT educates pt on recommendation of assistance & ongoing PT services at [facility] following d/c & pt agreeable, just wishing to discuss with husband first - team made aware. Will continue to follow pt acutely to progress gait as able, as well as standing balance & RLE strengthening.  Updating d/c plans to reflect need for SNF upon d/c as pt is making very slow progress with functional mobility, requiring min/mod assist at this time.  BP sitting in recliner: 110/65 mmHg (LUE), HR = 86 bpm BP sitting EOB after stand pivot: 101/48 mmHg (LUE), HR = 93 bpm    Follow Up Recommendations  SNF;Supervision/Assistance - 24 hour     Equipment Recommendations  Rolling walker with 5" wheels;3in1 (PT)    Recommendations for Other Services       Precautions / Restrictions Precautions Precautions: Fall;Posterior Hip Precaution Booklet Issued: Yes (comment) Restrictions Weight Bearing Restrictions: Yes RLE Weight Bearing: Partial weight bearing    Mobility  Bed Mobility Overal bed mobility: Needs Assistance Bed Mobility: Sit to Supine     Supine to sit: Mod assist;HOB elevated Sit to supine: Min assist   General  bed mobility comments: min assist to elevate RLE onto bed & maintain posterior hip precautions  Transfers Overall transfer level: Needs assistance Equipment used: Rolling walker (2 wheeled) Transfers: Risk manager;Sit to/from Stand Sit to Stand: Min assist;Mod assist Stand pivot transfers: Min assist;Mod assist       General transfer comment: cuing for hand/foot placement, pt with LOB during initial sit<>stand requiring assistance to safely return to sitting in recliner      Stairs             Wheelchair Mobility    Modified Rankin (Stroke Patients Only)       Balance Overall balance assessment: Needs assistance Sitting-balance support: Bilateral upper extremity supported;Feet supported Sitting balance-Leahy Scale: Good Sitting balance - Comments: supervision static sitting balance EOB   Standing balance support: Bilateral upper extremity supported Standing balance-Leahy Scale: Poor Standing balance comment: BUE support on RW during standing                            Cognition Arousal/Alertness:  (drowsy - pt concerned it's due to pain meds) Behavior During Therapy: WFL for tasks assessed/performed Overall Cognitive Status: Within Functional Limits for tasks assessed                                 General Comments: pt concerned about lack of progress, drowsiness limiting sessions      Exercises      General  Comments General comments (skin integrity, edema, etc.): Attempted to check standing BP but pt with 1 LOB requiring mod assist to correct after standing ~1 minute; pt unable to allow LUE to hang off walker to get accurate reading from dinamap      Pertinent Vitals/Pain Pain Assessment: 0-10 Pain Score:  (1-2) Faces Pain Scale: Hurts little more Pain Location: R hip with movement Pain Descriptors / Indicators: Sore Pain Intervention(s): Limited activity within patient's tolerance;Monitored during session    Home  Living                      Prior Function            PT Goals (current goals can now be found in the care plan section) Acute Rehab PT Goals Patient Stated Goal: go home soon PT Goal Formulation: With patient Time For Goal Achievement: 09/28/20 Potential to Achieve Goals: Good Progress towards PT goals: PT to reassess next treatment    Frequency    BID      PT Plan Discharge plan needs to be updated    Co-evaluation              AM-PAC PT "6 Clicks" Mobility   Outcome Measure  Help needed turning from your back to your side while in a flat bed without using bedrails?: A Little Help needed moving from lying on your back to sitting on the side of a flat bed without using bedrails?: A Little Help needed moving to and from a bed to a chair (including a wheelchair)?: A Little Help needed standing up from a chair using your arms (e.g., wheelchair or bedside chair)?: A Lot Help needed to walk in hospital room?: A Lot Help needed climbing 3-5 steps with a railing? : A Lot 6 Click Score: 15    End of Session Equipment Utilized During Treatment: Gait belt Activity Tolerance: Patient limited by fatigue Patient left: in bed;with call bell/phone within reach;with bed alarm set;with SCD's reapplied (hip wedge in place) Nurse Communication:  (pt c/o drowsiness) PT Visit Diagnosis: Unsteadiness on feet (R26.81);Difficulty in walking, not elsewhere classified (R26.2);Muscle weakness (generalized) (M62.81)     Time: 2423-5361 PT Time Calculation (min) (ACUTE ONLY): 24 min  Charges:  $Therapeutic Activity: 23-37 mins                     Lavone Nian, PT, DPT 09/15/20, 4:22 PM    Waunita Schooner 09/15/2020, 4:19 PM

## 2020-09-15 NOTE — Progress Notes (Signed)
PROGRESS NOTE    Martha Evans  CBJ:[id number] DOB: 17-Sep-1946 DOA: 09/12/2020 PCP: Birdie Sons, MD   Chief Complaint  Patient presents with  . Hip Injury    Brief Narrative: 74 female with history of breast cancer in 2015 and status post lumpectomy, XRT and tamoxifen x5 years with no recurrence, hypertension, HLD, GERD who sustained a fall at a grocery store and brought to the ED and found to have right femoral neck fracture.  Orthopedic was consulted for operative intervention. Patient underwent  hip hemiarthroplasty by orthopedics 12/16.  Subjective:  Patient reports her right leg feels stiff .  She is hoping to work more with PT today.  Assessment & Plan:  Right femoral neck fracture, secondary mechanical fall. S/p hip hemiarthroplasty by Dr. Sabra Heck 12/16 appreciate orthopedic input we will continue to mobilize her continue pain control and DVT prophylaxis with Lovenox.  Deconditioned and feeling stiff today will need more PT OT and mobilization prior to discharging her home and home health therapy likely next day or so.  Breast cancer in 2015 and status post lumpectomy, XRT and tamoxifen x5 years with no recurrence-no issues.  Hypertension: BP is controlled.Cont home irbesartan.  HLD: Stable PCP follow-up recommended.  GERD: Continue PPI.  Osteopenia last DEXA scan in 2018 will need work-up for osteoporosis in bone clinic post op.  Leukocytosis likely reactive in the setting of hip fracture and surgery.  It has resolved.. Recent Labs  Lab 09/12/20 1630 09/13/20 0458 09/13/20 2121 09/14/20 0527 09/15/20 0534  WBC 14.4* 10.8* 16.0* 12.0* 10.5    Obesity with BMI 30.2.  She will benefit with outpatient PCP follow-up and weight loss.  Nutrition: Diet Order            Diet regular Room service appropriate? Yes; Fluid consistency: Thin  Diet effective now                Body mass index is 30.25 kg/m.  DVT prophylaxis: enoxaparin (LOVENOX) injection  40 mg Start: 09/15/20 0800 SCDs Start: 09/13/20 2055SCD.  No chemical prophylaxis due to procedure Code Status:   Code Status: Full Code  Family Communication: plan of care discussed with patient at bedside.  Status is: Inpatient  Remains inpatient appropriate because:IV treatments appropriate due to intensity of illness or inability to take PO and Inpatient level of care appropriate due to severity of illness   Dispo: The patient is from: Home              Anticipated d/c is to: HHptot vs HH- TBD              Anticipated d/c date is: tomorrow              Patient currently is not medically stable to d/c.  Consultants:see note  Procedures:see note  Culture/Microbiology    Component Value Date/Time   SDES  10/09/2019 0105    URINE, CLEAN CATCH Performed at [facility], [place] [address], [place], [place] [address]    SPECREQUEST  10/09/2019 0105    NONE Performed at [facility], [place] [address], [place],  [address]    CULT >=100,000 COLONIES/mL ESCHERICHIA COLI (A) 10/09/2019 0105   REPTSTATUS 10/12/2019 FINAL 10/09/2019 0105    Other culture-see note  Medications: Scheduled Meds: . calcium carbonate  1 tablet Oral TID  . Chlorhexidine Gluconate Cloth  6 each Topical Q0600  . docusate sodium  100 mg Oral BID  . enoxaparin (LOVENOX)  injection  40 mg Subcutaneous Q24H  . famotidine  20 mg Oral BID  . ferrous sulfate  325 mg Oral Q breakfast  . gabapentin  300 mg Oral TID  . irbesartan  150 mg Oral Daily  . nitrofurantoin  50 mg Oral QHS   Continuous Infusions: . methocarbamol (ROBAXIN) IV      Antimicrobials: Anti-infectives (From admission, onward)   Start     Dose/Rate Route Frequency Ordered Stop   09/14/20 0600  clindamycin (CLEOCIN) IVPB 600 mg        600 mg 100 mL/hr over 30 Minutes Intravenous On call to O.R. 09/13/20 1245 09/13/20 1742   09/14/20 0000  clindamycin (CLEOCIN) IVPB 600 mg        600 mg 100 mL/hr  over 30 Minutes Intravenous Every 6 hours 09/13/20 2054 09/14/20 0641   09/13/20 2200  nitrofurantoin (MACRODANTIN) capsule 50 mg        50 mg Oral Daily at bedtime 09/12/20 2139     09/13/20 2200  clindamycin (CLEOCIN) IVPB 600 mg  Status:  Discontinued        600 mg 100 mL/hr over 30 Minutes Intravenous Every 8 hours 09/13/20 2054 09/13/20 2104   09/13/20 1729  clindamycin (CLEOCIN) 600 MG/50ML IVPB       Note to Pharmacy: Trudie Reed   : cabinet override      09/13/20 1729 09/13/20 1732   09/13/20 1620  ceFAZolin (ANCEF) 2-4 GM/100ML-% IVPB       Note to Pharmacy: Myles Lipps   : cabinet override      09/13/20 1620 09/13/20 1712   09/13/20 1100  ceFAZolin (ANCEF) IVPB 2g/100 mL premix        2 g 200 mL/hr over 30 Minutes Intravenous 30 min pre-op 09/12/20 2204 09/13/20 1716     Objective: Vitals: Today's Vitals   09/14/20 2013 09/14/20 2351 09/15/20 0348 09/15/20 0816  BP: 112/60 (!) 113/58 127/64 135/66  Pulse: 95 (!) 106 (!) 107 (!) 109  Resp: 16 16 17 18   Temp: 98.7 F (37.1 C) 98.4 F (36.9 C) 99.4 F (37.4 C) 98.4 F (36.9 C)  TempSrc: Oral   Oral  SpO2: 95% 100% 96% 92%  Weight:      Height:      PainSc:        Intake/Output Summary (Last 24 hours) at 09/15/2020 1048 Last data filed at 09/15/2020 0650 Gross per 24 hour  Intake 360 ml  Output 300 ml  Net 60 ml   Filed Weights   09/12/20 1625 09/13/20 1554  Weight: 85.3 kg 85 kg   Weight change:   Intake/Output from previous day: 12/17 0701 - 12/18 0700 In: 840 [P.O.:840] Out: 300 [Urine:300] Intake/Output this shift: No intake/output data recorded.  Examination: General exam: AAOx3 , NAD, weak appearing. HEENT:Oral mucosa moist, Ear/Nose WNL grossly, dentition normal. Respiratory system: bilaterally clear,no wheezing or crackles,no use of accessory muscle Cardiovascular system: S1 & S2 +, No JVD,. Gastrointestinal system: Abdomen soft, NT,ND, BS+ Nervous System:Alert, awake, moving extremities  and grossly nonfocal Extremities: Right hip incision site dressing intact, no edema, distal peripheral pulses palpable.  Skin: No rashes,no icterus. MSK: Normal muscle bulk,tone, powerr  Data Reviewed: I have personally reviewed following labs and imaging studies CBC: Recent Labs  Lab 09/12/20 1630 09/13/20 0458 09/13/20 2121 09/14/20 0527 09/15/20 0534  WBC 14.4* 10.8* 16.0* 12.0* 10.5  NEUTROABS 11.1*  --   --   --   --   HGB  12.6 12.0 12.5 11.1* 10.4*  HCT 37.0 35.8* 37.4 33.9* 30.9*  MCV 92.0 91.6 91.9 93.4 93.4  PLT 187 169 154 151 767*   Basic Metabolic Panel: Recent Labs  Lab 09/12/20 1630 09/13/20 0458 09/13/20 2121 09/14/20 0527 09/15/20 0534  NA 140 139  --  140 138  K 3.6 3.7  --  4.0 3.8  CL 104 105  --  106 104  CO2 26 26  --  26 26  GLUCOSE 101* 119*  --  147* 118*  BUN 23 20  --  20 28*  CREATININE 0.96 0.87 0.88 0.82 0.83  CALCIUM 9.1 8.8*  --  8.7* 8.2*   GFR: Estimated Creatinine Clearance: 66.3 mL/min (by C-G formula based on SCr of 0.83 mg/dL). Liver Function Tests: Recent Labs  Lab 09/12/20 1630  AST 24  ALT 20  ALKPHOS 95  BILITOT 0.7  PROT 7.4  ALBUMIN 4.1   No results for input(s): LIPASE, AMYLASE in the last 168 hours. No results for input(s): AMMONIA in the last 168 hours. Coagulation Profile: Recent Labs  Lab 09/12/20 1630  INR 0.9   Cardiac Enzymes: No results for input(s): CKTOTAL, CKMB, CKMBINDEX, TROPONINI in the last 168 hours. BNP (last 3 results) No results for input(s): PROBNP in the last 8760 hours. HbA1C: No results for input(s): HGBA1C in the last 72 hours. CBG: No results for input(s): GLUCAP in the last 168 hours. Lipid Profile: No results for input(s): CHOL, HDL, LDLCALC, TRIG, CHOLHDL, LDLDIRECT in the last 72 hours. Thyroid Function Tests: Recent Labs    09/12/20 2301  TSH 0.883   Anemia Panel: No results for input(s): VITAMINB12, FOLATE, FERRITIN, TIBC, IRON, RETICCTPCT in the last 72  hours. Sepsis Labs: No results for input(s): PROCALCITON, LATICACIDVEN in the last 168 hours.  Recent Results (from the past 240 hour(s))  Resp Panel by RT-PCR (Flu A&B, Covid) Nasopharyngeal Swab     Status: None   Collection Time: 09/12/20  4:30 PM   Specimen: Nasopharyngeal Swab; Nasopharyngeal(NP) swabs in vial transport medium  Result Value Ref Range Status   SARS Coronavirus 2 by RT PCR NEGATIVE NEGATIVE Final    Comment: (NOTE) SARS-CoV-2 target nucleic acids are NOT DETECTED.  The SARS-CoV-2 RNA is generally detectable in upper respiratory specimens during the acute phase of infection. The lowest concentration of SARS-CoV-2 viral copies this assay can detect is 138 copies/mL. A negative result does not preclude SARS-Cov-2 infection and should not be used as the sole basis for treatment or other patient management decisions. A negative result may occur with  improper specimen collection/handling, submission of specimen other than nasopharyngeal swab, presence of viral mutation(s) within the areas targeted by this assay, and inadequate number of viral copies(<138 copies/mL). A negative result must be combined with clinical observations, patient history, and epidemiological information. The expected result is Negative.  Fact Sheet for Patients:  EntrepreneurPulse.com.au  Fact Sheet for Healthcare Providers:  IncredibleEmployment.be  This test is no t yet approved or cleared by the [place] FDA and  has been authorized for detection and/or diagnosis of SARS-CoV-2 by FDA under an Emergency Use Authorization (EUA). This EUA will remain  in effect (meaning this test can be used) for the duration of the COVID-19 declaration under Section 564(b)(1) of the Act, 21 U.S.C.section 360bbb-3(b)(1), unless the authorization is terminated  or revoked sooner.       Influenza A by PCR NEGATIVE NEGATIVE Final   Influenza B by PCR NEGATIVE  NEGATIVE Final  Comment: (NOTE) The Xpert Xpress SARS-CoV-2/FLU/RSV plus assay is intended as an aid in the diagnosis of influenza from Nasopharyngeal swab specimens and should not be used as a sole basis for treatment. Nasal washings and aspirates are unacceptable for Xpert Xpress SARS-CoV-2/FLU/RSV testing.  Fact Sheet for Patients: EntrepreneurPulse.com.au  Fact Sheet for Healthcare Providers: IncredibleEmployment.be  This test is not yet approved or cleared by the [place] FDA and has been authorized for detection and/or diagnosis of SARS-CoV-2 by FDA under an Emergency Use Authorization (EUA). This EUA will remain in effect (meaning this test can be used) for the duration of the COVID-19 declaration under Section 564(b)(1) of the Act, 21 U.S.C. section 360bbb-3(b)(1), unless the authorization is terminated or revoked.  Performed at [facility], [address], [place], [place] [address]   Surgical pcr screen     Status: None   Collection Time: 09/13/20  4:40 AM   Specimen: Nasal Mucosa; Nasal Swab  Result Value Ref Range Status   MRSA, PCR NEGATIVE NEGATIVE Final   Staphylococcus aureus NEGATIVE NEGATIVE Final    Comment: (NOTE) The Xpert SA Assay (FDA approved for NASAL specimens in patients 72 years of age and older), is one component of a comprehensive surveillance program. It is not intended to diagnose infection nor to guide or monitor treatment. Performed at [facility], [address], [place], [place] [address]      Radiology Studies: DG Hip Port Unilat With Pelvis 1V Right  Result Date: 09/13/2020 CLINICAL DATA:  Post hemiarthroplasty EXAM: DG HIP (WITH OR WITHOUT PELVIS) 1V PORT RIGHT COMPARISON:  09/12/2020 FINDINGS: RIGHT hip hemiarthroplasty. No acute fracture dislocation. Prosthetic component appears well position. IMPRESSION: No complication following RIGHT hip hemiarthroplasty  Electronically Signed   By: Suzy Bouchard M.D.   On: 09/13/2020 20:05     LOS: 3 days   Antonieta Pert, MD [facility]  09/15/2020, 10:48 AM

## 2020-09-15 NOTE — Progress Notes (Signed)
Subjective: 2 Days Post-Op Procedure(s) (LRB): ARTHROPLASTY BIPOLAR HIP (HEMIARTHROPLASTY) (Right)   Patient is out of bed in a chair and doing well.  Walked more with PT today.  Hemoglobin stable at 10.4.  Dressing is dry.  Pain is mild.  Still wants to go home in a day or 2.  Patient reports pain as mild.  Objective:   VITALS:   Vitals:   09/15/20 0816 09/15/20 1249  BP: 135/66 119/71  Pulse: (!) 109 (!) 101  Resp: 18   Temp: 98.4 F (36.9 C) 98.2 F (36.8 C)  SpO2: 92% 95%    Sensation intact distally Dorsiflexion/Plantar flexion intact Incision: dressing C/D/I  LABS Recent Labs    09/13/20 2121 09/14/20 0527 09/15/20 0534  HGB 12.5 11.1* 10.4*  HCT 37.4 33.9* 30.9*  WBC 16.0* 12.0* 10.5  PLT 154 151 148*    Recent Labs    09/13/20 0458 09/13/20 2121 09/14/20 0527 09/15/20 0534  NA 139  --  140 138  K 3.7  --  4.0 3.8  BUN 20  --  20 28*  CREATININE 0.87 0.88 0.82 0.83  GLUCOSE 119*  --  147* 118*    Recent Labs    09/12/20 1630  INR 0.9     Assessment/Plan: 2 Days Post-Op Procedure(s) (LRB): ARTHROPLASTY BIPOLAR HIP (HEMIARTHROPLASTY) (Right)   Up with therapy Discharge home with home health in 1 to 2 days.  Enteric-coated aspirin twice daily on discharge for DVT prophylaxis  Partial weightbearing right leg with walker  Return to clinic 2 weeks for x-ray and staple removal

## 2020-09-15 NOTE — Progress Notes (Signed)
Physical Therapy Treatment Patient Details Name: Martha Evans MRN: [id number] DOB: 20-Jul-1946 Today's Date: 09/15/2020    History of Present Illness Pt is a 74 y/o F who fell while grocery shopping & found to have R femoral neck fx. Pt underwent posterolateral hip hemiarthroplasty on 09/13/20 by Dr. Sabra Heck. PMH: breast CA (2015), s/p lumpectomy, s/p XRT, HTN, hypercholesterolemia, GERD, cardiomegaly, dysrhythmia, R hearing aide    PT Comments    Pt was long sitting in bed with abduction pillow between legs. Pt was agreeable to PT session and motivated to participate. She requested to use BR. Required mod assist to exit L side of bed with assistance with LE/trunk support. She was able to correctly state 2/3 hip precautions and states proper PWB restrictions. Upon standing pt struggles to maintain PWB and fatigued extremely quickly with only taking ~ 3 steps to [facility] then 3 steps to recliner. Unable to have BM but did urinate 400 CC. Pt reports feeling cold sweats during transfers. HR elevated to 110 bpm with sao2 90% at its lowest. Pt really wants to DC home with [facility] services however would benefit from rehab. Defer DC disposition to MD. If pt decides to DC straight to home, may need EMS transport due to stair entry into home. She was sitting in recliner with call bell in reach and chair alarm in place.   Follow Up Recommendations  Follow surgeon's recommendation for DC plan and follow-up therapies;Other (comment) (not progressing as quickly as planned. Pt plans to DC home with [facility] services.)     Equipment Recommendations  Rolling walker with 5" wheels;3in1 (PT)    Recommendations for Other Services       Precautions / Restrictions Precautions Precautions: Fall;Posterior Hip Precaution Booklet Issued: Yes (comment) Restrictions Weight Bearing Restrictions: Yes RLE Weight Bearing: Partial weight bearing    Mobility  Bed Mobility Overal bed mobility: Needs Assistance Bed Mobility:  Supine to Sit (HOB elevated)     Supine to sit: Mod assist;HOB elevated     General bed mobility comments: Pt continues to require mod assist to exit L side of bed. Required assistance with LE  and upper body progression  Transfers Overall transfer level: Needs assistance Equipment used: Rolling walker (2 wheeled) Transfers: Sit to/from [facility] Sit to Stand: Min assist;Mod assist Stand pivot transfers: From elevated surface       General transfer comment: Min assist to stand from elevated. Mod assist from lower BSC height  Ambulation/Gait Ambulation/Gait assistance: Min assist Gait Distance (Feet): 3 Feet Assistive device: Rolling walker (2 wheeled) Gait Pattern/deviations: Step-to pattern;Antalgic;Trunk flexed;Narrow base of support Gait velocity: decreased   General Gait Details: Pt struggles to maintain PWB. unsafe to advance gait distances endless wt restrictions changed. She reports have severe cold sweats.       Balance Overall balance assessment: Needs assistance Sitting-balance support: Bilateral upper extremity supported;Feet supported Sitting balance-Leahy Scale: Good Sitting balance - Comments: supervision static sitting balance EOB   Standing balance support: Bilateral upper extremity supported Standing balance-Leahy Scale: Poor Standing balance comment: BUE support on RW during standing       Cognition Arousal/Alertness: Awake/alert Behavior During Therapy: WFL for tasks assessed/performed Overall Cognitive Status: Within Functional Limits for tasks assessed    General Comments: Pt is A and O x 4 and agreeable to PT session. Motivated to participate however has poor insight of deficits             Pertinent Vitals/Pain Pain Assessment: 0-10 Pain Score:  6  Faces Pain Scale: Hurts little more Pain Location: Hip Pain Intervention(s): Limited activity within patient's tolerance;Monitored during session;Premedicated before  session;Repositioned;Ice applied           PT Goals (current goals can now be found in the care plan section) Acute Rehab PT Goals Patient Stated Goal: go home soon Progress towards PT goals: Progressing toward goals    Frequency    BID      PT Plan Discharge plan needs to be updated (defer to surgeon)       AM-PAC PT "6 Clicks" Mobility   Outcome Measure  Help needed turning from your back to your side while in a flat bed without using bedrails?: A Little Help needed moving from lying on your back to sitting on the side of a flat bed without using bedrails?: A Lot Help needed moving to and from a bed to a chair (including a wheelchair)?: A Little Help needed standing up from a chair using your arms (e.g., wheelchair or bedside chair)?: A Little Help needed to walk in hospital room?: A Little Help needed climbing 3-5 steps with a railing? : A Lot 6 Click Score: 16    End of Session Equipment Utilized During Treatment: Gait belt Activity Tolerance: Patient limited by fatigue Patient left: in chair;with call bell/phone within reach;with chair alarm set Nurse Communication: Mobility status;Weight bearing status;Precautions PT Visit Diagnosis: Unsteadiness on feet (R26.81);Difficulty in walking, not elsewhere classified (R26.2);Muscle weakness (generalized) (M62.81)     Time: 4734-0370 PT Time Calculation (min) (ACUTE ONLY): 36 min  Charges:  $Therapeutic Activity: 23-37 mins                     Julaine Fusi PTA 09/15/20, 12:46 PM

## 2020-09-16 LAB — CBC
HCT: 29 % — ABNORMAL LOW (ref 36.0–46.0)
Hemoglobin: 9.8 g/dL — ABNORMAL LOW (ref 12.0–15.0)
MCH: 31.6 pg (ref 26.0–34.0)
MCHC: 33.8 g/dL (ref 30.0–36.0)
MCV: 93.5 fL (ref 80.0–100.0)
Platelets: 147 10*3/uL — ABNORMAL LOW (ref 150–400)
RBC: 3.1 MIL/uL — ABNORMAL LOW (ref 3.87–5.11)
RDW: 12.7 % (ref 11.5–15.5)
WBC: 10.8 10*3/uL — ABNORMAL HIGH (ref 4.0–10.5)
nRBC: 0 % (ref 0.0–0.2)

## 2020-09-16 NOTE — Progress Notes (Signed)
Physical Therapy Treatment Patient Details Name: Martha Evans MRN: [id number] DOB: 03/28/1946 Today's Date: 09/16/2020    History of Present Illness Pt is a 74 y/o F who fell while grocery shopping & found to have R femoral neck fx. Pt underwent posterolateral hip hemiarthroplasty on 09/13/20 by Dr. Sabra Heck. PMH: breast CA (2015), s/p lumpectomy, s/p XRT, HTN, hypercholesterolemia, GERD, cardiomegaly, dysrhythmia, R hearing aide    PT Comments    Pt pleasant & agreeable to tx, appearing more alert compared to PM session yesterday. Pt able to recall 3/3 posterior hip precautions without assistance. Pt continues to require min assist for bed mobility, min assist for sit<>stand & stand pivot transfers with RW. Pt reports need to have BM so assisted to [facility] with pt reporting it will take her extended time to use restroom so pt left on [facility] with call bell in reach & NT aware of location/situation. Pt would benefit from STR following d/c from hospital to maximize independence with functional mobility prior to d/c home.     Follow Up Recommendations  SNF;Supervision/Assistance - 24 hour     Equipment Recommendations  Rolling walker with 5" wheels;3in1 (PT)    Recommendations for Other Services       Precautions / Restrictions Precautions Precautions: Fall;Posterior Hip Restrictions Weight Bearing Restrictions: Yes RLE Weight Bearing: Partial weight bearing    Mobility  Bed Mobility Overal bed mobility: Needs Assistance Bed Mobility: Supine to Sit     Supine to sit: Min assist;HOB elevated     General bed mobility comments: min assist to transfer RLE to EOB  Transfers Overall transfer level: Needs assistance Equipment used: Rolling walker (2 wheeled) Transfers: Risk manager;Sit to/from Stand Sit to Stand: Min assist;From elevated surface Stand pivot transfers: Min assist (difficulty maintaining PWB RLE during stand pivot transfer)       General transfer comment:  cuing for hand/foot placement during sit>stand transfers  Ambulation/Gait                 Stairs             Wheelchair Mobility    Modified Rankin (Stroke Patients Only)       Balance Overall balance assessment: Needs assistance Sitting-balance support: Bilateral upper extremity supported;Feet supported Sitting balance-Leahy Scale: Good Sitting balance - Comments: supervision static sitting balance EOB   Standing balance support: Bilateral upper extremity supported Standing balance-Leahy Scale: Poor Standing balance comment: BUE support on RW during standing (no overt signs of LOB during standing/stand pivot transfer today as compared to yesterday)                            Cognition Arousal/Alertness: Awake/alert Behavior During Therapy: WFL for tasks assessed/performed Overall Cognitive Status: Within Functional Limits for tasks assessed                                 General Comments: a little more alert than yesterday      Exercises      General Comments General comments (skin integrity, edema, etc.): Pt reports she & husband are agreeable for her to d/c to SNF from hospital; pt hopeful to go to [facility]      Pertinent Vitals/Pain Faces Pain Scale: Hurts a little bit Pain Location: R hip with movement Pain Descriptors / Indicators: Sore Pain Intervention(s): Limited activity within patient's tolerance;Monitored during session  Home Living                      Prior Function            PT Goals (current goals can now be found in the care plan section) Acute Rehab PT Goals Patient Stated Goal: go home soon PT Goal Formulation: With patient Time For Goal Achievement: 09/28/20 Potential to Achieve Goals: Good Progress towards PT goals: Progressing toward goals    Frequency    BID      PT Plan Current plan remains appropriate    Co-evaluation              AM-PAC PT "6 Clicks" Mobility    Outcome Measure  Help needed turning from your back to your side while in a flat bed without using bedrails?: A Little Help needed moving from lying on your back to sitting on the side of a flat bed without using bedrails?: A Little Help needed moving to and from a bed to a chair (including a wheelchair)?: A Little Help needed standing up from a chair using your arms (e.g., wheelchair or bedside chair)?: A Little Help needed to walk in hospital room?: A Lot Help needed climbing 3-5 steps with a railing? : A Lot 6 Click Score: 16    End of Session Equipment Utilized During Treatment: Gait belt Activity Tolerance: Patient tolerated treatment well Patient left: with call bell/phone within reach (on [facility]) Nurse Communication:  (pt left on BSC) PT Visit Diagnosis: Unsteadiness on feet (R26.81);Difficulty in walking, not elsewhere classified (R26.2);Muscle weakness (generalized) (M62.81)     Time: 6283-1517 PT Time Calculation (min) (ACUTE ONLY): 11 min  Charges:  $Therapeutic Activity: 8-22 mins                     Lavone Nian, PT, DPT 09/16/20, 8:57 AM    Waunita Schooner 09/16/2020, 8:55 AM

## 2020-09-16 NOTE — Progress Notes (Signed)
Subjective: 3 Days Post-Op Procedure(s) (LRB): ARTHROPLASTY BIPOLAR HIP (HEMIARTHROPLASTY) (Right)   Patient looks and feels better today.  Has decided to go to skilled nursing for short time to get her strength up.  Minimal pain.  Participating in PT.  Patient reports pain as mild.  Objective:   VITALS:   Vitals:   09/16/20 1232 09/16/20 1509  BP: (!) 117/56 (!) 113/55  Pulse: 98 85  Resp: 18 16  Temp: 98 F (36.7 C)   SpO2: 96% 93%    Sensation intact distally Dorsiflexion/Plantar flexion intact Incision: scant drainage  LABS Recent Labs    09/14/20 0527 09/15/20 0534 09/16/20 0419  HGB 11.1* 10.4* 9.8*  HCT 33.9* 30.9* 29.0*  WBC 12.0* 10.5 10.8*  PLT 151 148* 147*    Recent Labs    09/13/20 2121 09/14/20 0527 09/15/20 0534  NA  --  140 138  K  --  4.0 3.8  BUN  --  20 28*  CREATININE 0.88 0.82 0.83  GLUCOSE  --  147* 118*    No results for input(s): LABPT, INR in the last 72 hours.   Assessment/Plan: 3 Days Post-Op Procedure(s) (LRB): ARTHROPLASTY BIPOLAR HIP (HEMIARTHROPLASTY) (Right)   Up with therapy Discharge to SNF at any time  Enteric-coated aspirin twice daily on discharge  Partial weightbearing right leg  Return to clinic 2 weeks for x-ray and staple removal

## 2020-09-16 NOTE — Progress Notes (Signed)
PROGRESS NOTE    Martha Evans  TDH:[id number] DOB: 06/05/1946 DOA: 09/12/2020 PCP: Birdie Sons, MD   Chief Complaint  Patient presents with  . Hip Injury    Brief Narrative: 28 female with history of breast cancer in 2015 and status post lumpectomy, XRT and tamoxifen x5 years with no recurrence, hypertension, HLD, GERD who sustained a fall at a grocery store and brought to the ED and found to have right femoral neck fracture.  Orthopedic was consulted for operative intervention. Patient underwent  hip hemiarthroplasty by orthopedics 12/16.  Subjective:  PT worked with her yesterday and has recommended skilled nursing facility once her home meds. No acute events overnight.  Blood pressure stable. At bedside chair, husband at bedside too  Assessment & Plan:  Right femoral neck fracture, secondary mechanical fall. S/p hip hemiarthroplasty by Dr. Sabra Heck 12/16 appreciate orthopedic input we will continue to mobilize her continue pain control and DVT prophylaxis with Lovenox.  Patient appears very deconditioned will benefit with skilled nursing facility  As recommended by PT recommended-TOC consulted.  Breast cancer in 2015 and status post lumpectomy, XRT and tamoxifen x5 years with no outpatient follow-up   Hypertension: BP was soft  So stopped home irbesartan for now- resume on d/c.  HLD: no issue.  GERD: cont PPI.  Osteopenia last DEXA scan in 2018 will need work-up for osteoporosis in bone clinic post op.  Leukocytosis likely reactive in the setting of hip fracture and surgery.  improved. Recent Labs  Lab 09/13/20 0458 09/13/20 2121 09/14/20 0527 09/15/20 0534 09/16/20 0419  WBC 10.8* 16.0* 12.0* 10.5 10.8*   Obesity with BMI 30.2.  Patient will benefit w/ PCP follow-up and weight loss.  Nutrition: Diet Order            Diet regular Room service appropriate? Yes; Fluid consistency: Thin  Diet effective now                Body mass index is 30.25  kg/m.  DVT prophylaxis: enoxaparin (LOVENOX) injection 40 mg Start: 09/15/20 0800 SCDs Start: 09/13/20 2055SCD.  No chemical prophylaxis due to procedure Code Status:   Code Status: Full Code  Family Communication: plan of care discussed with patient at bedside.  Status is: Inpatient  Remains inpatient appropriate because:IV treatments appropriate due to intensity of illness or inability to take PO and Inpatient level of care appropriate due to severity of illness  Dispo: The patient is from: Home              Anticipated d/c is to: SNF              Anticipated d/c date is: ONCE AVAILABLE, likely monday              Patient currently is medically stable for discharge  Consultants:see note  Procedures:see note  Culture/Microbiology  Other culture-see note  Medications: Scheduled Meds: . calcium carbonate  1 tablet Oral TID  . Chlorhexidine Gluconate Cloth  6 each Topical Q0600  . docusate sodium  100 mg Oral BID  . enoxaparin (LOVENOX) injection  40 mg Subcutaneous Q24H  . famotidine  20 mg Oral BID  . ferrous sulfate  325 mg Oral Q breakfast  . gabapentin  300 mg Oral TID  . nitrofurantoin  50 mg Oral QHS   Continuous Infusions: . methocarbamol (ROBAXIN) IV      Antimicrobials: Anti-infectives (From admission, onward)   Start     Dose/Rate Route Frequency Ordered Stop  09/14/20 0600  clindamycin (CLEOCIN) IVPB 600 mg        600 mg 100 mL/hr over 30 Minutes Intravenous On call to O.R. 09/13/20 1245 09/13/20 1742   09/14/20 0000  clindamycin (CLEOCIN) IVPB 600 mg        600 mg 100 mL/hr over 30 Minutes Intravenous Every 6 hours 09/13/20 2054 09/14/20 0641   09/13/20 2200  nitrofurantoin (MACRODANTIN) capsule 50 mg        50 mg Oral Daily at bedtime 09/12/20 2139     09/13/20 2200  clindamycin (CLEOCIN) IVPB 600 mg  Status:  Discontinued        600 mg 100 mL/hr over 30 Minutes Intravenous Every 8 hours 09/13/20 2054 09/13/20 2104   09/13/20 1729  clindamycin  (CLEOCIN) 600 MG/50ML IVPB       Note to Pharmacy: Trudie Reed   : cabinet override      09/13/20 1729 09/13/20 1732   09/13/20 1620  ceFAZolin (ANCEF) 2-4 GM/100ML-% IVPB       Note to Pharmacy: Myles Lipps   : cabinet override      09/13/20 1620 09/13/20 1712   09/13/20 1100  ceFAZolin (ANCEF) IVPB 2g/100 mL premix        2 g 200 mL/hr over 30 Minutes Intravenous 30 min pre-op 09/12/20 2204 09/13/20 1716     Objective: Vitals: Today's Vitals   09/15/20 2248 09/15/20 2334 09/16/20 0526 09/16/20 0723  BP:  (!) 105/54 126/65 (!) 122/57  Pulse:  86 86 90  Resp:  17 17 16   Temp:  98.6 F (37 C) 98.2 F (36.8 C) 98.3 F (36.8 C)  TempSrc:    Oral  SpO2:  96% 95% 93%  Weight:      Height:      PainSc: 4        Intake/Output Summary (Last 24 hours) at 09/16/2020 0805 Last data filed at 09/16/2020 0518 Gross per 24 hour  Intake 1188.24 ml  Output 2850 ml  Net -1661.76 ml   Filed Weights   09/12/20 1625 09/13/20 1554  Weight: 85.3 kg 85 kg   Weight change:   Intake/Output from previous day: 12/18 0701 - 12/19 0700 In: 1188.2 [I.V.:1188.2] Out: 2850 [Urine:2850] Intake/Output this shift: No intake/output data recorded.  Examination: General exam: AAOx3 , NAD, weak appearing. HEENT:Oral mucosa moist, Ear/Nose WNL grossly, dentition normal. Respiratory system: bilaterally clear,no wheezing or crackles,no use of accessory muscle Cardiovascular system: S1 & S2 +, No JVD,. Gastrointestinal system: Abdomen soft, NT,ND, BS+ Nervous System:Alert, awake, moving extremities and grossly nonfocal Extremities: No edema, distal peripheral pulses palpable. Rt hip incision site with intact dressing Skin: No rashes,no icterus. MSK: Normal muscle bulk,tone, power  Data Reviewed: I have personally reviewed following labs and imaging studies CBC: Recent Labs  Lab 09/12/20 1630 09/13/20 0458 09/13/20 2121 09/14/20 0527 09/15/20 0534 09/16/20 0419  WBC 14.4* 10.8* 16.0* 12.0*  10.5 10.8*  NEUTROABS 11.1*  --   --   --   --   --   HGB 12.6 12.0 12.5 11.1* 10.4* 9.8*  HCT 37.0 35.8* 37.4 33.9* 30.9* 29.0*  MCV 92.0 91.6 91.9 93.4 93.4 93.5  PLT 187 169 154 151 148* 010*   Basic Metabolic Panel: Recent Labs  Lab 09/12/20 1630 09/13/20 0458 09/13/20 2121 09/14/20 0527 09/15/20 0534  NA 140 139  --  140 138  K 3.6 3.7  --  4.0 3.8  CL 104 105  --  106 104  CO2  26 26  --  26 26  GLUCOSE 101* 119*  --  147* 118*  BUN 23 20  --  20 28*  CREATININE 0.96 0.87 0.88 0.82 0.83  CALCIUM 9.1 8.8*  --  8.7* 8.2*   GFR: Estimated Creatinine Clearance: 66.3 mL/min (by C-G formula based on SCr of 0.83 mg/dL). Liver Function Tests: Recent Labs  Lab 09/12/20 1630  AST 24  ALT 20  ALKPHOS 95  BILITOT 0.7  PROT 7.4  ALBUMIN 4.1   No results for input(s): LIPASE, AMYLASE in the last 168 hours. No results for input(s): AMMONIA in the last 168 hours. Coagulation Profile: Recent Labs  Lab 09/12/20 1630  INR 0.9   Cardiac Enzymes: No results for input(s): CKTOTAL, CKMB, CKMBINDEX, TROPONINI in the last 168 hours. BNP (last 3 results) No results for input(s): PROBNP in the last 8760 hours. HbA1C: No results for input(s): HGBA1C in the last 72 hours. CBG: No results for input(s): GLUCAP in the last 168 hours. Lipid Profile: No results for input(s): CHOL, HDL, LDLCALC, TRIG, CHOLHDL, LDLDIRECT in the last 72 hours. Thyroid Function Tests: No results for input(s): TSH, T4TOTAL, FREET4, T3FREE, THYROIDAB in the last 72 hours. Anemia Panel: No results for input(s): VITAMINB12, FOLATE, FERRITIN, TIBC, IRON, RETICCTPCT in the last 72 hours. Sepsis Labs: No results for input(s): PROCALCITON, LATICACIDVEN in the last 168 hours.  Recent Results (from the past 240 hour(s))  Resp Panel by RT-PCR (Flu A&B, Covid) Nasopharyngeal Swab     Status: None   Collection Time: 09/12/20  4:30 PM   Specimen: Nasopharyngeal Swab; Nasopharyngeal(NP) swabs in vial transport  medium  Result Value Ref Range Status   SARS Coronavirus 2 by RT PCR NEGATIVE NEGATIVE Final    Comment: (NOTE) SARS-CoV-2 target nucleic acids are NOT DETECTED.  The SARS-CoV-2 RNA is generally detectable in upper respiratory specimens during the acute phase of infection. The lowest concentration of SARS-CoV-2 viral copies this assay can detect is 138 copies/mL. A negative result does not preclude SARS-Cov-2 infection and should not be used as the sole basis for treatment or other patient management decisions. A negative result may occur with  improper specimen collection/handling, submission of specimen other than nasopharyngeal swab, presence of viral mutation(s) within the areas targeted by this assay, and inadequate number of viral copies(<138 copies/mL). A negative result must be combined with clinical observations, patient history, and epidemiological information. The expected result is Negative.  Fact Sheet for Patients:  EntrepreneurPulse.com.au  Fact Sheet for Healthcare Providers:  IncredibleEmployment.be  This test is no t yet approved or cleared by the [place] FDA and  has been authorized for detection and/or diagnosis of SARS-CoV-2 by FDA under an Emergency Use Authorization (EUA). This EUA will remain  in effect (meaning this test can be used) for the duration of the COVID-19 declaration under Section 564(b)(1) of the Act, 21 U.S.C.section 360bbb-3(b)(1), unless the authorization is terminated  or revoked sooner.       Influenza A by PCR NEGATIVE NEGATIVE Final   Influenza B by PCR NEGATIVE NEGATIVE Final    Comment: (NOTE) The Xpert Xpress SARS-CoV-2/FLU/RSV plus assay is intended as an aid in the diagnosis of influenza from Nasopharyngeal swab specimens and should not be used as a sole basis for treatment. Nasal washings and aspirates are unacceptable for Xpert Xpress SARS-CoV-2/FLU/RSV testing.  Fact Sheet for  Patients: EntrepreneurPulse.com.au  Fact Sheet for Healthcare Providers: IncredibleEmployment.be  This test is not yet approved or cleared by the [place]  States FDA and has been authorized for detection and/or diagnosis of SARS-CoV-2 by FDA under an Emergency Use Authorization (EUA). This EUA will remain in effect (meaning this test can be used) for the duration of the COVID-19 declaration under Section 564(b)(1) of the Act, 21 U.S.C. section 360bbb-3(b)(1), unless the authorization is terminated or revoked.  Performed at [facility], [address] [place], [place] [address]   Surgical pcr screen     Status: None   Collection Time: 09/13/20  4:40 AM   Specimen: Nasal Mucosa; Nasal Swab  Result Value Ref Range Status   MRSA, PCR NEGATIVE NEGATIVE Final   Staphylococcus aureus NEGATIVE NEGATIVE Final    Comment: (NOTE) The Xpert SA Assay (FDA approved for NASAL specimens in patients 79 years of age and older), is one component of a comprehensive surveillance program. It is not intended to diagnose infection nor to guide or monitor treatment. Performed at [facility], [address], [place], [place] [address]      Radiology Studies: No results found.   LOS: 4 days   Antonieta Pert, MD [facility]  09/16/2020, 8:05 AM

## 2020-09-16 NOTE — Progress Notes (Signed)
Pt and husband verbalized desire to be placed in a Rehab facility, specifically [facility].

## 2020-09-16 NOTE — Plan of Care (Signed)
  Problem: Clinical Measurements: Goal: Ability to maintain clinical measurements within normal limits will improve Outcome: Progressing   Problem: Clinical Measurements: Goal: Will remain free from infection Outcome: Progressing   Problem: Clinical Measurements: Goal: Diagnostic test results will improve Outcome: Progressing   

## 2020-09-17 LAB — SURGICAL PATHOLOGY

## 2020-09-17 MED ORDER — FERROUS SULFATE 325 (65 FE) MG PO TABS: 325.0000 mg | ORAL_TABLET | Freq: Every day | ORAL | 0 refills | Status: DC

## 2020-09-17 MED ORDER — TRAMADOL HCL 50 MG PO TABS: 50.0000 mg | ORAL_TABLET | Freq: Four times a day (QID) | ORAL | 0 refills | Status: DC | PRN

## 2020-09-17 MED ORDER — CALCIUM CARBONATE ANTACID 500 MG PO CHEW: 1.0000 | CHEWABLE_TABLET | Freq: Three times a day (TID) | ORAL | 0 refills | Status: AC

## 2020-09-17 MED ORDER — DIMENHYDRINATE 50 MG PO TABS
50.0000 mg | ORAL_TABLET | Freq: Every evening | ORAL | Status: DC | PRN
  Filled 2020-09-17 (×2): qty 1

## 2020-09-17 MED ORDER — ACETAMINOPHEN 325 MG PO TABS: 325.0000 mg | ORAL_TABLET | Freq: Four times a day (QID) | ORAL | Status: DC | PRN

## 2020-09-17 NOTE — Progress Notes (Signed)
Physical Therapy Treatment Patient Details Name: Martha Evans MRN: [id number] DOB: 1946-07-04 Today's Date: 09/17/2020    History of Present Illness Pt is a 74 y/o F who fell while grocery shopping & found to have R femoral neck fx. Pt underwent posterolateral hip hemiarthroplasty on 09/13/20 by Dr. Sabra Heck. PMH: breast CA (2015), s/p lumpectomy, s/p XRT, HTN, hypercholesterolemia, GERD, cardiomegaly, dysrhythmia, R hearing aide    PT Comments    Pt received in bed, declining OOB mobility 2/2 just receiving a bath. Pt agreeable to bed level exercises & performs as noted below. Pt recalls 3/3 posterior hip precautions with extra time. Will continue to follow pt acutely to progress gait as able. Continue to recommend STR following d/c to maximize independence with mobility & reduce fall risk prior to return home.     Follow Up Recommendations  SNF;Supervision/Assistance - 24 hour     Equipment Recommendations  Rolling walker with 5" wheels;3in1 (PT)    Recommendations for Other Services       Precautions / Restrictions Precautions Precautions: Fall;Posterior Hip Precaution Booklet Issued: Yes (comment) Restrictions Weight Bearing Restrictions: Yes RLE Weight Bearing: Partial weight bearing    Mobility  Bed Mobility Overal bed mobility: Needs Assistance Bed Mobility: Supine to Sit     Supine to sit: Supervision;HOB elevated     General bed mobility comments: extra time  Transfers Overall transfer level: Needs assistance Equipment used: Rolling walker (2 wheeled) Transfers: Sit to/from [facility] Sit to Stand: Min assist;From elevated surface Stand pivot transfers: Min assist       General transfer comment: cuing for hand/foot placement, no LOB noted on this date  Ambulation/Gait Ambulation/Gait assistance: Min assist Gait Distance (Feet): 20 Feet Assistive device: Rolling walker (2 wheeled) Gait Pattern/deviations: Decreased step length -  left;Decreased step length - right;Decreased stride length Gait velocity: decreased   General Gait Details: cuing for PWB RLE with pt demonstrating somewhat improved ability to maintain, cuing for RLE placement when turning to prevent internal rotation   Stairs             Wheelchair Mobility    Modified Rankin (Stroke Patients Only)       Balance Overall balance assessment: Needs assistance Sitting-balance support: Bilateral upper extremity supported;Feet supported Sitting balance-Leahy Scale: Good Sitting balance - Comments: supervision static sitting balance EOB   Standing balance support: Bilateral upper extremity supported Standing balance-Leahy Scale: Poor Standing balance comment: BUE support on RW during standing                            Cognition Arousal/Alertness: Awake/alert Behavior During Therapy: WFL for tasks assessed/performed Overall Cognitive Status: Within Functional Limits for tasks assessed                                 General Comments: pt c/o nausea - provided pt with diet ginger ale upon request, pt declines asking nurse for antinausea medicine      Exercises General Exercises - Lower Extremity Ankle Circles/Pumps: AROM;Right;10 reps;Supine Quad Sets: AROM;Supine;Right;10 reps Gluteal Sets: AROM;10 reps;Supine Short Arc Quad: AROM;Right;10 reps;Supine Long Arc Quad: AROM;Right;Seated;10 reps Heel Slides: AAROM;Right;Supine;10 reps Hip ABduction/ADduction: AAROM;Supine;Right;10 reps (hip adduction wedge squeezes x 10 AROM, hip abduction x 10 AAROM) Straight Leg Raises: AAROM;Supine;Right;10 reps    General Comments General comments (skin integrity, edema, etc.): Pt reports slight dizziness with supine>sit but  after brief rest break reports it's better; pt tolerated gait better on this date; pt motivated to progress      Pertinent Vitals/Pain Pain Assessment: No/denies pain Faces Pain Scale: Hurts a little  bit Pain Location: R hip with movement Pain Descriptors / Indicators: Sore Pain Intervention(s): RN gave pain meds during session    Home Living                      Prior Function            PT Goals (current goals can now be found in the care plan section) Acute Rehab PT Goals Patient Stated Goal: go home soon PT Goal Formulation: With patient Time For Goal Achievement: 09/28/20 Potential to Achieve Goals: Good Progress towards PT goals: Progressing toward goals    Frequency    BID      PT Plan Current plan remains appropriate    Co-evaluation              AM-PAC PT "6 Clicks" Mobility   Outcome Measure  Help needed turning from your back to your side while in a flat bed without using bedrails?: A Little Help needed moving from lying on your back to sitting on the side of a flat bed without using bedrails?: A Little Help needed moving to and from a bed to a chair (including a wheelchair)?: A Little Help needed standing up from a chair using your arms (e.g., wheelchair or bedside chair)?: A Little Help needed to walk in hospital room?: A Little Help needed climbing 3-5 steps with a railing? : A Lot 6 Click Score: 17    End of Session Equipment Utilized During Treatment: Gait belt Activity Tolerance: Patient tolerated treatment well Patient left: in bed;with call bell/phone within reach;with bed alarm set (wedge pillow in place, pt declines SCDs)   PT Visit Diagnosis: Unsteadiness on feet (R26.81);Difficulty in walking, not elsewhere classified (R26.2);Muscle weakness (generalized) (M62.81)     Time: 7356-7014 PT Time Calculation (min) (ACUTE ONLY): 14 min  Charges:  $Therapeutic Exercise: 8-22 mins $Therapeutic Activity: 8-22 mins                     Lavone Nian, PT, DPT 09/17/20, 11:51 AM    Waunita Schooner 09/17/2020, 11:50 AM

## 2020-09-17 NOTE — NC FL2 (Signed)
[place] LEVEL OF CARE SCREENING TOOL     IDENTIFICATION  Patient Name: Martha Evans Birthdate: 1946/08/08 Sex: female Admission Date (Current Location): 09/12/2020  [place] and [place] Number:  Engineering geologist and Address:  [facility], [address] [place], [place] [address]      Provider Number: [id number]  Attending Physician Name and Address:  Antonieta Pert, MD  Relative Name and Phone Number:  Lendon Collar [phone]    Current Level of Care: Hospital Recommended Level of Care: [place] Prior Approval Number:    Date Approved/Denied:   PASRR Number: [id number]  Discharge Plan: SNF    Current Diagnoses: Patient Active Problem List   Diagnosis Date Noted  . Hip fracture ([place]) 09/12/2020  . Osteopenia 09/12/2020  . PVC's (premature ventricular contractions) 02/06/2020  . Allergic rhinitis 10/11/2015  . Colon polyp 10/11/2015  . 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 Vitamin D deficiency 10/11/2015  . Cardiac enlargement 05/24/2015  . Acid reflux 05/24/2015  . Hypercholesteremia 05/24/2015  . BP (high blood pressure) 05/24/2015  . Hot flashes 11/22/2014  . Arthralgia 11/22/2014  . S/P colostomy takedown 09/15/14 09/15/2014  . History of diverticular abscess of colon 07/19/2014  . 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 ARF (acute renal failure) ([place]) 02/17/2014  . Malignant neoplasm of lower-outer quadrant of right breast of female, estrogen receptor positive ([place]) 12/07/2013  . Multiple thyroid nodules, left lobe 07/25/2013  . History of thyroid lobectomy, right 07/25/2013  . Herpes zoster without complication 09/38/1829    Orientation RESPIRATION BLADDER Height & Weight     Self,Time,Situation  Normal Continent Weight: 85 kg Height:  5\' 6"  (167.6 cm)  BEHAVIORAL SYMPTOMS/MOOD NEUROLOGICAL BOWEL NUTRITION STATUS      Continent Diet (Regular)  AMBULATORY STATUS COMMUNICATION OF NEEDS  Skin   Extensive Assist Verbally Surgical wounds                       Personal Care Assistance Level of Assistance  Dressing,Feeding,Bathing Bathing Assistance: Limited assistance Feeding assistance: Independent Dressing Assistance: Limited assistance     Functional Limitations Info             SPECIAL CARE FACTORS FREQUENCY  PT (By licensed PT),OT (By licensed OT)                    Contractures Contractures Info: Not present    Additional Factors Info  Code Status,Allergies Code Status Info: Full Allergies Info: Codeine, Tramadol, Hydrocodone, Oxycodone           Current Medications (09/17/2020):  This is the current hospital active medication list Current Facility-Administered Medications  Medication Dose Route Frequency Provider Last Rate Last Admin  . acetaminophen (TYLENOL) tablet 325-650 mg  325-650 mg Oral Q6H PRN Earnestine Leys, MD   650 mg at 09/16/20 1135  . alum & mag hydroxide-simeth (MAALOX/MYLANTA) 200-200-20 MG/5ML suspension 30 mL  30 mL Oral Q4H PRN Earnestine Leys, MD      . bisacodyl (DULCOLAX) suppository 10 mg  10 mg Rectal Daily PRN Earnestine Leys, MD      . calcium carbonate (TUMS - dosed in mg elemental calcium) chewable tablet 200 mg of elemental calcium  1 tablet Oral TID Earnestine Leys, MD   200 mg of elemental calcium at 09/17/20 0910  . Chlorhexidine Gluconate Cloth 2 % PADS 6 each  6 each Topical Q0600 Earnestine Leys, MD   6 each at 09/16/20  3716  . docusate sodium (COLACE) capsule 100 mg  100 mg Oral BID Earnestine Leys, MD   100 mg at 09/17/20 0910  . enoxaparin (LOVENOX) injection 40 mg  40 mg Subcutaneous Q24H Rowland Lathe, RPH   40 mg at 09/17/20 0858  . famotidine (PEPCID) tablet 20 mg  20 mg Oral BID Pernell Dupre, RPH   20 mg at 09/17/20 0910  . ferrous sulfate tablet 325 mg  325 mg Oral Q breakfast Earnestine Leys, MD   325 mg at 09/17/20 0858  . gabapentin (NEURONTIN) capsule 300 mg  300 mg Oral TID Earnestine Leys,  MD   300 mg at 09/16/20 0811  . HYDROcodone-acetaminophen (NORCO) 7.5-325 MG per tablet 1-2 tablet  1-2 tablet Oral Q4H PRN Earnestine Leys, MD   2 tablet at 09/15/20 1253  . HYDROcodone-acetaminophen (NORCO/VICODIN) 5-325 MG per tablet 1-2 tablet  1-2 tablet Oral Q4H PRN Earnestine Leys, MD   1 tablet at 09/14/20 1455  . menthol-cetylpyridinium (CEPACOL) lozenge 3 mg  1 lozenge Oral PRN Earnestine Leys, MD       Or  . phenol (CHLORASEPTIC) mouth spray 1 spray  1 spray Mouth/Throat PRN Earnestine Leys, MD      . methocarbamol (ROBAXIN) tablet 500 mg  500 mg Oral Q6H PRN Earnestine Leys, MD       Or  . methocarbamol (ROBAXIN) 500 mg in dextrose 5 % 50 mL IVPB  500 mg Intravenous Q6H PRN Earnestine Leys, MD      . metoCLOPramide (REGLAN) tablet 5-10 mg  5-10 mg Oral Q8H PRN Earnestine Leys, MD       Or  . metoCLOPramide (REGLAN) injection 5-10 mg  5-10 mg Intravenous Q8H PRN Earnestine Leys, MD      . metoprolol tartrate (LOPRESSOR) injection 5 mg  5 mg Intravenous Q6H PRN Earnestine Leys, MD      . morphine 2 MG/ML injection 0.5-1 mg  0.5-1 mg Intravenous Q2H PRN Earnestine Leys, MD      . morphine 2 MG/ML injection 2 mg  2 mg Intravenous Q2H PRN Earnestine Leys, MD   2 mg at 09/13/20 0449  . nitrofurantoin (MACRODANTIN) capsule 50 mg  50 mg Oral QHS Earnestine Leys, MD   50 mg at 09/16/20 2203  . ondansetron (ZOFRAN) tablet 4 mg  4 mg Oral Q6H PRN Earnestine Leys, MD       Or  . ondansetron [facility]) injection 4 mg  4 mg Intravenous Q6H PRN Earnestine Leys, MD   4 mg at 09/13/20 1230  . oxyCODONE (Oxy IR/ROXICODONE) immediate release tablet 5-10 mg  5-10 mg Oral Q3H PRN Earnestine Leys, MD   10 mg at 09/14/20 0920  . polyethylene glycol (MIRALAX / GLYCOLAX) packet 17 g  17 g Oral Daily PRN Earnestine Leys, MD      . sodium phosphate (FLEET) 7-19 GM/118ML enema 1 enema  1 enema Rectal Once PRN Earnestine Leys, MD      . traMADol Veatrice Bourbon) tablet 50 mg  50 mg Oral Q6H PRN Earnestine Leys, MD      . zolpidem  Lorrin Mais) tablet 5 mg  5 mg Oral QHS PRN Earnestine Leys, MD         Discharge Medications: Please see discharge summary for a list of discharge medications.  Relevant Imaging Results:  Relevant Lab Results:   Additional Information SS# [id number]  Shelbie Ammons, RN

## 2020-09-17 NOTE — Progress Notes (Signed)
PROGRESS NOTE    Martha Evans  AVW:[id number] DOB: 1946/02/21 DOA: 09/12/2020 PCP: Birdie Sons, MD   Chief Complaint  Patient presents with  . Hip Injury    Brief Narrative: 17 female with history of breast cancer in 2015 and status post lumpectomy, XRT and tamoxifen x5 years with no recurrence, hypertension, HLD, GERD who sustained a fall at a grocery store and brought to the ED and found to have right femoral neck fracture.  Orthopedic was consulted for operative intervention. Patient underwent  hip hemiarthroplasty by orthopedics 12/16.  Subjective:  PT worked with her yesterday and has recommended skilled nursing facility once her home meds. No acute events overnight.  Blood pressure stable. At bedside chair, husband at bedside too  Assessment & Plan:  Right femoral neck fracture, secondary mechanical fall. S/p hip hemiarthroplasty by Dr. Sabra Heck 12/16. Doing well psot op.  Continue DVT prophylaxis as per Ortho, cont pain control, calcium and iron supplementation.Continue PT OT, awaiting on a skilled nursing facility.    Breast cancer in 2015 and status post lumpectomy, XRT and tamoxifen x5 years with no outpatient follow-up   Hypertension: BP stable currently.  Can resume ARBs on discharge  HLD: no issue. GERD: Continue PPI Osteopenia last DEXA scan in 2018 will need work-up for osteoporosis in bone clinic post op.  Continue calcium supplementation  Leukocytosis likely reactive in the setting of hip fracture and surgery.  improved. Recent Labs  Lab 09/13/20 0458 09/13/20 2121 09/14/20 0527 09/15/20 0534 09/16/20 0419  WBC 10.8* 16.0* 12.0* 10.5 10.8*   Obesity with BMI 30.2:Patient will benefit w/ PCP follow-up and weight loss.  Nutrition: Diet Order            Diet regular Room service appropriate? Yes; Fluid consistency: Thin  Diet effective now                Body mass index is 30.25 kg/m.  DVT prophylaxis: enoxaparin (LOVENOX) injection 40 mg  Start: 09/15/20 0800 SCDs Start: 09/13/20 2055SCD.  No chemical prophylaxis due to procedure Code Status:   Code Status: Full Code  Family Communication: plan of care discussed with patient at bedside.  Status is: Inpatient  Remains inpatient appropriate because:IV treatments appropriate due to intensity of illness or inability to take PO and Inpatient level of care appropriate due to severity of illness  Dispo: The patient is from: Home              Anticipated d/c is to: SNF              Anticipated d/c date is: Once bed available              Patient currently is medically stable for discharge  Consultants:see note  Procedures:see note  Culture/Microbiology  Other culture-see note  Medications: Scheduled Meds: . calcium carbonate  1 tablet Oral TID  . Chlorhexidine Gluconate Cloth  6 each Topical Q0600  . docusate sodium  100 mg Oral BID  . enoxaparin (LOVENOX) injection  40 mg Subcutaneous Q24H  . famotidine  20 mg Oral BID  . ferrous sulfate  325 mg Oral Q breakfast  . gabapentin  300 mg Oral TID  . nitrofurantoin  50 mg Oral QHS   Continuous Infusions: . methocarbamol (ROBAXIN) IV      Antimicrobials: Anti-infectives (From admission, onward)   Start     Dose/Rate Route Frequency Ordered Stop   09/14/20 0600  clindamycin (CLEOCIN) IVPB 600 mg  600 mg 100 mL/hr over 30 Minutes Intravenous On call to O.R. 09/13/20 1245 09/13/20 1742   09/14/20 0000  clindamycin (CLEOCIN) IVPB 600 mg        600 mg 100 mL/hr over 30 Minutes Intravenous Every 6 hours 09/13/20 2054 09/14/20 0641   09/13/20 2200  nitrofurantoin (MACRODANTIN) capsule 50 mg        50 mg Oral Daily at bedtime 09/12/20 2139     09/13/20 2200  clindamycin (CLEOCIN) IVPB 600 mg  Status:  Discontinued        600 mg 100 mL/hr over 30 Minutes Intravenous Every 8 hours 09/13/20 2054 09/13/20 2104   09/13/20 1729  clindamycin (CLEOCIN) 600 MG/50ML IVPB       Note to Pharmacy: Trudie Reed   : cabinet  override      09/13/20 1729 09/13/20 1732   09/13/20 1620  ceFAZolin (ANCEF) 2-4 GM/100ML-% IVPB       Note to Pharmacy: Myles Lipps   : cabinet override      09/13/20 1620 09/13/20 1712   09/13/20 1100  ceFAZolin (ANCEF) IVPB 2g/100 mL premix        2 g 200 mL/hr over 30 Minutes Intravenous 30 min pre-op 09/12/20 2204 09/13/20 1716     Objective: Vitals: Today's Vitals   09/17/20 0401 09/17/20 0757 09/17/20 1000 09/17/20 1149  BP: 123/66 132/62  120/67  Pulse: 86 93  97  Resp: 16 16  18   Temp: 98.2 F (36.8 C) 98.3 F (36.8 C)  98.2 F (36.8 C)  TempSrc:  Oral  Oral  SpO2: 95% 95%  97%  Weight:      Height:      PainSc:   4      Intake/Output Summary (Last 24 hours) at 09/17/2020 1323 Last data filed at 09/17/2020 0406 Gross per 24 hour  Intake --  Output 1000 ml  Net -1000 ml   Filed Weights   09/12/20 1625 09/13/20 1554  Weight: 85.3 kg 85 kg   Weight change:   Intake/Output from previous day: 12/19 0701 - 12/20 0700 In: 408.3 [I.V.:408.3] Out: 1350 [Urine:1350] Intake/Output this shift: No intake/output data recorded.  Examination: General exam: AAOx3 , NAD, weak appearing. HEENT:Oral mucosa moist, Ear/Nose WNL grossly, dentition normal. Respiratory system: bilaterally clear,no wheezing or crackles,no use of accessory muscle Cardiovascular system: S1 & S2 +, No JVD,. Gastrointestinal system: Abdomen soft, NT,ND, BS+ Nervous System:Alert, awake, moving extremities and grossly nonfocal Extremities: No edema, distal peripheral pulses palpable.  Right hip surgical site clean/dry intact dressing Skin: No rashes,no icterus. MSK: Normal muscle bulk,tone, power  Data Reviewed: I have personally reviewed following labs and imaging studies CBC: Recent Labs  Lab 09/12/20 1630 09/13/20 0458 09/13/20 2121 09/14/20 0527 09/15/20 0534 09/16/20 0419  WBC 14.4* 10.8* 16.0* 12.0* 10.5 10.8*  NEUTROABS 11.1*  --   --   --   --   --   HGB 12.6 12.0 12.5 11.1*  10.4* 9.8*  HCT 37.0 35.8* 37.4 33.9* 30.9* 29.0*  MCV 92.0 91.6 91.9 93.4 93.4 93.5  PLT 187 169 154 151 148* 341*   Basic Metabolic Panel: Recent Labs  Lab 09/12/20 1630 09/13/20 0458 09/13/20 2121 09/14/20 0527 09/15/20 0534  NA 140 139  --  140 138  K 3.6 3.7  --  4.0 3.8  CL 104 105  --  106 104  CO2 26 26  --  26 26  GLUCOSE 101* 119*  --  147* 118*  BUN 23 20  --  20 28*  CREATININE 0.96 0.87 0.88 0.82 0.83  CALCIUM 9.1 8.8*  --  8.7* 8.2*   GFR: Estimated Creatinine Clearance: 66.3 mL/min (by C-G formula based on SCr of 0.83 mg/dL). Liver Function Tests: Recent Labs  Lab 09/12/20 1630  AST 24  ALT 20  ALKPHOS 95  BILITOT 0.7  PROT 7.4  ALBUMIN 4.1   No results for input(s): LIPASE, AMYLASE in the last 168 hours. No results for input(s): AMMONIA in the last 168 hours. Coagulation Profile: Recent Labs  Lab 09/12/20 1630  INR 0.9   Cardiac Enzymes: No results for input(s): CKTOTAL, CKMB, CKMBINDEX, TROPONINI in the last 168 hours. BNP (last 3 results) No results for input(s): PROBNP in the last 8760 hours. HbA1C: No results for input(s): HGBA1C in the last 72 hours. CBG: No results for input(s): GLUCAP in the last 168 hours. Lipid Profile: No results for input(s): CHOL, HDL, LDLCALC, TRIG, CHOLHDL, LDLDIRECT in the last 72 hours. Thyroid Function Tests: No results for input(s): TSH, T4TOTAL, FREET4, T3FREE, THYROIDAB in the last 72 hours. Anemia Panel: No results for input(s): VITAMINB12, FOLATE, FERRITIN, TIBC, IRON, RETICCTPCT in the last 72 hours. Sepsis Labs: No results for input(s): PROCALCITON, LATICACIDVEN in the last 168 hours.  Recent Results (from the past 240 hour(s))  Resp Panel by RT-PCR (Flu A&B, Covid) Nasopharyngeal Swab     Status: None   Collection Time: 09/12/20  4:30 PM   Specimen: Nasopharyngeal Swab; Nasopharyngeal(NP) swabs in vial transport medium  Result Value Ref Range Status   SARS Coronavirus 2 by RT PCR NEGATIVE  NEGATIVE Final    Comment: (NOTE) SARS-CoV-2 target nucleic acids are NOT DETECTED.  The SARS-CoV-2 RNA is generally detectable in upper respiratory specimens during the acute phase of infection. The lowest concentration of SARS-CoV-2 viral copies this assay can detect is 138 copies/mL. A negative result does not preclude SARS-Cov-2 infection and should not be used as the sole basis for treatment or other patient management decisions. A negative result may occur with  improper specimen collection/handling, submission of specimen other than nasopharyngeal swab, presence of viral mutation(s) within the areas targeted by this assay, and inadequate number of viral copies(<138 copies/mL). A negative result must be combined with clinical observations, patient history, and epidemiological information. The expected result is Negative.  Fact Sheet for Patients:  EntrepreneurPulse.com.au  Fact Sheet for Healthcare Providers:  IncredibleEmployment.be  This test is no t yet approved or cleared by the [place] FDA and  has been authorized for detection and/or diagnosis of SARS-CoV-2 by FDA under an Emergency Use Authorization (EUA). This EUA will remain  in effect (meaning this test can be used) for the duration of the COVID-19 declaration under Section 564(b)(1) of the Act, 21 U.S.C.section 360bbb-3(b)(1), unless the authorization is terminated  or revoked sooner.       Influenza A by PCR NEGATIVE NEGATIVE Final   Influenza B by PCR NEGATIVE NEGATIVE Final    Comment: (NOTE) The Xpert Xpress SARS-CoV-2/FLU/RSV plus assay is intended as an aid in the diagnosis of influenza from Nasopharyngeal swab specimens and should not be used as a sole basis for treatment. Nasal washings and aspirates are unacceptable for Xpert Xpress SARS-CoV-2/FLU/RSV testing.  Fact Sheet for Patients: EntrepreneurPulse.com.au  Fact Sheet for Healthcare  Providers: IncredibleEmployment.be  This test is not yet approved or cleared by the [place] FDA and has been authorized for detection and/or diagnosis of SARS-CoV-2 by FDA under an Emergency  Use Authorization (EUA). This EUA will remain in effect (meaning this test can be used) for the duration of the COVID-19 declaration under Section 564(b)(1) of the Act, 21 U.S.C. section 360bbb-3(b)(1), unless the authorization is terminated or revoked.  Performed at [facility], [address], [place], [place] [address]   Surgical pcr screen     Status: None   Collection Time: 09/13/20  4:40 AM   Specimen: Nasal Mucosa; Nasal Swab  Result Value Ref Range Status   MRSA, PCR NEGATIVE NEGATIVE Final   Staphylococcus aureus NEGATIVE NEGATIVE Final    Comment: (NOTE) The Xpert SA Assay (FDA approved for NASAL specimens in patients 21 years of age and older), is one component of a comprehensive surveillance program. It is not intended to diagnose infection nor to guide or monitor treatment. Performed at [facility], [address], [place], [place] [address]      Radiology Studies: No results found.   LOS: 5 days   Antonieta Pert, MD [facility]  09/17/2020, 1:23 PM

## 2020-09-17 NOTE — Plan of Care (Signed)
Pt OOB sitting on bedside recliner,NAD noted.denies any pain or discomfort.Pt tolerating ambulation with RW ,transfers and using bsc.Appetite and fluid intake good.R hip drssing c/d/I.no verbal c/o or concerns at this time.

## 2020-09-17 NOTE — TOC Initial Note (Signed)
Transition of Care [facility]) - Initial/Assessment Note    Patient Details  Name: Martha Evans MRN: [id number] Date of Birth: 1946-08-01  [facility]) CM/SW Contact:    Shelbie Ammons, RN Phone Number: 09/17/2020, 9:43 AM  Clinical Narrative:     RNCM met with patient in room. Patient sitting up in recliner in process of working with PT. Patient reports to feeling ok today, she lives at home with her husband and is normally independent. Patient reports that she understands that SNF is the recommendation for her and she is in agreement with this. Patient requests [facility] but reports [facility] is her second choice.  RNCM completed PASSR, FL2 and sent bed requests to [facility] and [facility].    Expected Discharge Plan: Skilled Nursing Facility Barriers to Discharge: No Barriers Identified   Patient Goals and CMS Choice        Expected Discharge Plan and Services Expected Discharge Plan: [place] Acute Care Choice: [place] Living arrangements for the past 2 months: Single Family Home                                      Prior Living Arrangements/Services Living arrangements for the past 2 months: Single Family Home Lives with:: Spouse Patient language and need for interpreter reviewed:: Yes Do you feel safe going back to the place where you live?: Yes      Need for Family Participation in Patient Care: Yes (Comment) Care giver support system in place?: Yes (comment)   Criminal Activity/Legal Involvement Pertinent to Current Situation/Hospitalization: No - Comment as needed  Activities of Daily Living Home Assistive Devices/Equipment: None ADL Screening (condition at time of admission) Patient's cognitive ability adequate to safely complete daily activities?: No Is the patient deaf or have difficulty hearing?: No Does the patient have difficulty seeing, even when wearing glasses/contacts?:  No Does the patient have difficulty concentrating, remembering, or making decisions?: No Patient able to express need for assistance with ADLs?: Yes Does the patient have difficulty dressing or bathing?: No Independently performs ADLs?: Yes (appropriate for developmental age) Does the patient have difficulty walking or climbing stairs?: No Weakness of Legs: None Weakness of Arms/Hands: None  Permission Sought/Granted                  Emotional Assessment Appearance:: Appears stated age Attitude/Demeanor/Rapport: Engaged Affect (typically observed): Calm,Appropriate Orientation: : Oriented to Self,Oriented to Place,Oriented to  Time,Oriented to Situation Alcohol / Substance Use: Not Applicable Psych Involvement: No (comment)  Admission diagnosis:  Hip fracture ([place]) [S72.009A] Closed fracture of right hip, initial encounter ([place]) [S72.001A] Patient Active Problem List   Diagnosis Date Noted  . Hip fracture ([place]) 09/12/2020  . Osteopenia 09/12/2020  . PVC's (premature ventricular contractions) 02/06/2020  . Allergic rhinitis 10/11/2015  . Colon polyp 10/11/2015  . 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 Vitamin D deficiency 10/11/2015  . Cardiac enlargement 05/24/2015  . Acid reflux 05/24/2015  . Hypercholesteremia 05/24/2015  . BP (high blood pressure) 05/24/2015  . Hot flashes 11/22/2014  . Arthralgia 11/22/2014  . S/P colostomy takedown 09/15/14 09/15/2014  . History of diverticular abscess of colon 07/19/2014  . 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 ARF (acute renal failure) ([place]) 02/17/2014  . Malignant neoplasm of lower-outer quadrant of right breast of female, estrogen receptor positive ([place]) 12/07/2013  .  Multiple thyroid nodules, left lobe 07/25/2013  . History of thyroid lobectomy, right 07/25/2013  . Herpes zoster without complication 94/32/0037   PCP:  Birdie Sons, MD Pharmacy:   [facility] [address], [place] - [place] [address] [place] [address] Phone: [phone] Fax: [phone]  [place] ([place], [place] [address] Phone: [phone] Fax: [phone]     Social Determinants of Health (SDOH) Interventions    Readmission Risk Interventions No flowsheet data found.

## 2020-09-17 NOTE — Progress Notes (Signed)
Physical Therapy Treatment Patient Details Name: Martha Evans MRN: [id number] DOB: 1946/01/18 Today's Date: 09/17/2020    History of Present Illness Pt is a 74 y/o F who fell while grocery shopping & found to have R femoral neck fx. Pt underwent posterolateral hip hemiarthroplasty on 09/13/20 by Dr. Sabra Heck. PMH: breast CA (2015), s/p lumpectomy, s/p XRT, HTN, hypercholesterolemia, GERD, cardiomegaly, dysrhythmia, R hearing aide    PT Comments    Pt more alert & able to progress gait with RW & min assist on this date. Pt continues to require cuing for PWB during mobility but with improving ability to maintain. Pt able to recall 2/3 posterior hip precautions with PT educating her on remaining one & providing cuing for technique when turning to prevent internal rotation. Pt would benefit from ongoing PT treatment to progress gait and RLE strengthening, as well as standing balance.     Follow Up Recommendations  SNF;Supervision/Assistance - 24 hour     Equipment Recommendations  Rolling walker with 5" wheels;3in1 (PT)    Recommendations for Other Services       Precautions / Restrictions Precautions Precautions: Fall;Posterior Hip Precaution Booklet Issued: Yes (comment) Restrictions Weight Bearing Restrictions: Yes RLE Weight Bearing: Partial weight bearing    Mobility  Bed Mobility Overal bed mobility: Needs Assistance Bed Mobility: Supine to Sit     Supine to sit: Supervision;HOB elevated     General bed mobility comments: extra time  Transfers Overall transfer level: Needs assistance Equipment used: Rolling walker (2 wheeled) Transfers: Sit to/from [facility] Sit to Stand: Min assist;From elevated surface Stand pivot transfers: Min assist       General transfer comment: cuing for hand/foot placement, no LOB noted on this date  Ambulation/Gait Ambulation/Gait assistance: Min assist Gait Distance (Feet): 20 Feet Assistive device: Rolling  walker (2 wheeled) Gait Pattern/deviations: Decreased step length - left;Decreased step length - right;Decreased stride length Gait velocity: decreased   General Gait Details: cuing for PWB RLE with pt demonstrating somewhat improved ability to maintain, cuing for RLE placement when turning to prevent internal rotation   Stairs             Wheelchair Mobility    Modified Rankin (Stroke Patients Only)       Balance Overall balance assessment: Needs assistance Sitting-balance support: Bilateral upper extremity supported;Feet supported Sitting balance-Leahy Scale: Good Sitting balance - Comments: supervision static sitting balance EOB   Standing balance support: Bilateral upper extremity supported Standing balance-Leahy Scale: Poor Standing balance comment: BUE support on RW during standing                            Cognition Arousal/Alertness: Awake/alert Behavior During Therapy: WFL for tasks assessed/performed Overall Cognitive Status: Within Functional Limits for tasks assessed                                 General Comments: more alert on this date      Exercises General Exercises - Lower Extremity Ankle Circles/Pumps: AROM;Right;10 reps;Supine Quad Sets: AROM;Supine;Right;10 reps Long Arc Quad: AROM;Right;Seated;10 reps Heel Slides: AAROM;Right;Supine;10 reps Hip ABduction/ADduction: AAROM;Supine;Right;10 reps Straight Leg Raises: AAROM;Supine;Right;10 reps    General Comments General comments (skin integrity, edema, etc.): Pt reports slight dizziness with supine>sit but after brief rest break reports it's better; pt tolerated gait better on this date; pt motivated to progress  Pertinent Vitals/Pain Pain Assessment: Faces Faces Pain Scale: Hurts a little bit Pain Location: R hip with movement Pain Descriptors / Indicators: Sore Pain Intervention(s): RN gave pain meds during session    Home Living                       Prior Function            PT Goals (current goals can now be found in the care plan section) Acute Rehab PT Goals Patient Stated Goal: go home soon PT Goal Formulation: With patient Time For Goal Achievement: 09/28/20 Potential to Achieve Goals: Good Progress towards PT goals: Progressing toward goals    Frequency    BID      PT Plan Current plan remains appropriate    Co-evaluation              AM-PAC PT "6 Clicks" Mobility   Outcome Measure  Help needed turning from your back to your side while in a flat bed without using bedrails?: A Little Help needed moving from lying on your back to sitting on the side of a flat bed without using bedrails?: A Little Help needed moving to and from a bed to a chair (including a wheelchair)?: A Little Help needed standing up from a chair using your arms (e.g., wheelchair or bedside chair)?: A Little Help needed to walk in hospital room?: A Little Help needed climbing 3-5 steps with a railing? : A Lot 6 Click Score: 17    End of Session Equipment Utilized During Treatment: Gait belt Activity Tolerance: Patient tolerated treatment well Patient left: in chair;with call bell/phone within reach;with chair alarm set;with SCD's reapplied (hip wedge in place)   PT Visit Diagnosis: Unsteadiness on feet (R26.81);Difficulty in walking, not elsewhere classified (R26.2);Muscle weakness (generalized) (M62.81)     Time: 4166-0630 PT Time Calculation (min) (ACUTE ONLY): 24 min  Charges:  $Therapeutic Exercise: 8-22 mins $Therapeutic Activity: 8-22 mins                     Lavone Nian, PT, DPT 09/17/20, 11:07 AM    Waunita Schooner 09/17/2020, 11:06 AM

## 2020-09-17 NOTE — Discharge Summary (Signed)
Physician Discharge Summary  Martha Evans IHK:[id number] DOB: 15-Feb-1946 DOA: 09/12/2020  PCP: Birdie Sons, MD  Admit date: 09/12/2020 Discharge date: 09/18/2020  Admitted From: home Disposition:  SNF  Recommendations for Outpatient Follow-up:  1. Follow up with PCP in 1-2 weeks 2. Please obtain BMP/CBC in one week 3. Please follow up on the following pending results:  Home Health:NO  Equipment/Devices: NONE  Discharge Condition: Stable Code Status:   Code Status: Full Code Diet recommendation:  Diet Order            Diet regular Room service appropriate? Yes; Fluid consistency: Thin  Diet effective now                  Brief/Interim Summary: 74 female with history of breast cancer in 2015 and status post lumpectomy, XRT and tamoxifen x5 years with no recurrence, hypertension, HLD, GERD who sustained a fall at a grocery store and brought to the ED and found to have right femoral neck fracture.  Orthopedic was consulted for operative intervention. Patient underwent Rt  hip hemiarthroplasty by orthopedics 12/16 Remains deconditioned stiff.  Seen by PT OT initially homebound but subsequently upgraded to skilled nursing facility.  At this time she is medically stable for discharge to skilled nursing facility once bed available  Discharge Diagnoses:   Right femoral neck fracture, secondary mechanical fall. S/p hip hemiarthroplasty by Dr. Sabra Heck 12/16 appreciate orthopedic input pain is controlled.  Patient will need a skilled nursing facility as recommended by PT OT and awaiting on placement and medically stable for discharge.  As per orthopedic patient will continue on aspirin 81 mg twice daily x6 weeks for DVT prophylaxis  SHE NEEDS F/U 2 WK POST OP to remove her staples.  Breast cancer in 2015 and status post lumpectomy, XRT and tamoxifen x5 years with no outpatient follow-up   Hypertension: BP remains stable home meds on hold, consider resuming at the SNF HLD: no  issue. GERD: cont PPI. Osteopenia last DEXA scan in 2018 will need work-up for osteoporosis in bone clinic post op. Leukocytosis likely reactive in the setting of hip fracture and surgery.  Has resolved. Last Labs          Recent Labs  Lab 09/13/20 0458 09/13/20 2121 09/14/20 0527 09/15/20 0534 09/16/20 0419  WBC 10.8* 16.0* 12.0* 10.5 10.8*     Obesity with BMI 30.2.  Will benefit with weight loss and PCP follow-up  Consults:  Orthopedic  Subjective: Alert awake oriented pain is controlled.  She is hopeful for discharge today to skilled nursing facility.  Discharge Exam: Vitals:   09/18/20 0505 09/18/20 0749  BP: 127/62 (!) 122/59  Pulse: 85 80  Resp: 16 17  Temp: 98.2 F (36.8 C) 98.2 F (36.8 C)  SpO2: 94% 97%   General exam: AAOX3 , NAD, weak appearing. HEENT:Oral mucosa moist, Ear/Nose WNL grossly, dentition normal. Respiratory system: bilaterally CLEAR,no wheezing or crackles,no use of accessory muscle Cardiovascular system: S1 & S2 +, No JVD,. Gastrointestinal system: Abdomen soft, NT,ND, BS+ Nervous System:Alert, awake, moving extremities and grossly nonfocal Extremities: No edema, distal peripheral pulses palpable.  Right surgical site clean dry intact with dressing in place Skin: No rashes,no icterus. MSK: Normal muscle bulk,tone, power Discharge Instructions   Allergies as of 09/18/2020      Reactions   Codeine Nausea Only, Nausea And Vomiting   Codeine Sulfate Nausea And Vomiting   Tramadol Nausea Only   Nausea    Hydrocodone Nausea Only  Oxycodone Nausea Only      Medication List    TAKE these medications   acetaminophen 325 MG tablet Commonly known as: TYLENOL Take 1-2 tablets (325-650 mg total) by mouth every 6 (six) hours as needed for mild pain (pain score 1-3 or temp > 100.5).   aspirin EC 81 MG tablet Take 1 tablet (81 mg total) by mouth 2 (two) times daily. Swallow whole.   calcium carbonate 500 MG chewable tablet Commonly  known as: TUMS - dosed in mg elemental calcium Chew 1 tablet (200 mg of elemental calcium total) by mouth 3 (three) times daily.   dimenhyDRINATE 50 MG tablet Commonly known as: DRAMAMINE Take 1 tablet (50 mg total) by mouth at bedtime as needed (pt takes for sleep @ home).   ferrous sulfate 325 (65 FE) MG tablet Take 1 tablet (325 mg total) by mouth daily with breakfast.   nitrofurantoin 50 MG capsule Commonly known as: MACRODANTIN Take 1 capsule (50 mg total) by mouth at bedtime.   omeprazole 20 MG capsule Commonly known as: PRILOSEC Take 20 mg by mouth 2 (two) times daily before a meal.   telmisartan 40 MG tablet Commonly known as: MICARDIS Take 40 mg by mouth at bedtime.   traMADol 50 MG tablet Commonly known as: ULTRAM Take 1 tablet (50 mg total) by mouth every 6 (six) hours as needed for up to 6 doses for moderate pain.       Contact information for follow-up providers    Earnestine Leys, MD. Schedule an appointment as soon as possible for a visit in 2 day(s).   Specialty: Orthopedic Surgery Why: Make appointment for patient prior to discharge if possible please Contact information: [place] [address] [phone]        Birdie Sons, MD Follow up in 1 week(s).   Specialty: Family Medicine Contact information: [address] [place] [address] [phone]            Contact information for after-discharge care    [facility] .   Service: Skilled Nursing Contact information: [place] [phone]                 Allergies  Allergen Reactions  . Codeine Nausea Only and Nausea And Vomiting  . Codeine Sulfate Nausea And Vomiting  . Tramadol Nausea Only    Nausea   . Hydrocodone Nausea Only  . Oxycodone Nausea Only    The results of significant diagnostics from this hospitalization (including imaging,  microbiology, ancillary and laboratory) are listed below for reference.    Microbiology: Recent Results (from the past 240 hour(s))  Resp Panel by RT-PCR (Flu A&B, Covid) Nasopharyngeal Swab     Status: None   Collection Time: 09/12/20  4:30 PM   Specimen: Nasopharyngeal Swab; Nasopharyngeal(NP) swabs in vial transport medium  Result Value Ref Range Status   SARS Coronavirus 2 by RT PCR NEGATIVE NEGATIVE Final    Comment: (NOTE) SARS-CoV-2 target nucleic acids are NOT DETECTED.  The SARS-CoV-2 RNA is generally detectable in upper respiratory specimens during the acute phase of infection. The lowest concentration of SARS-CoV-2 viral copies this assay can detect is 138 copies/mL. A negative result does not preclude SARS-Cov-2 infection and should not be used as the sole basis for treatment or other patient management decisions. A negative result may occur with  improper specimen collection/handling, submission of specimen other than  nasopharyngeal swab, presence of viral mutation(s) within the areas targeted by this assay, and inadequate number of viral copies(<138 copies/mL). A negative result must be combined with clinical observations, patient history, and epidemiological information. The expected result is Negative.  Fact Sheet for Patients:  EntrepreneurPulse.com.au  Fact Sheet for Healthcare Providers:  IncredibleEmployment.be  This test is no t yet approved or cleared by the [place] FDA and  has been authorized for detection and/or diagnosis of SARS-CoV-2 by FDA under an Emergency Use Authorization (EUA). This EUA will remain  in effect (meaning this test can be used) for the duration of the COVID-19 declaration under Section 564(b)(1) of the Act, 21 U.S.C.section 360bbb-3(b)(1), unless the authorization is terminated  or revoked sooner.       Influenza A by PCR NEGATIVE NEGATIVE Final   Influenza B by PCR NEGATIVE NEGATIVE Final     Comment: (NOTE) The Xpert Xpress SARS-CoV-2/FLU/RSV plus assay is intended as an aid in the diagnosis of influenza from Nasopharyngeal swab specimens and should not be used as a sole basis for treatment. Nasal washings and aspirates are unacceptable for Xpert Xpress SARS-CoV-2/FLU/RSV testing.  Fact Sheet for Patients: EntrepreneurPulse.com.au  Fact Sheet for Healthcare Providers: IncredibleEmployment.be  This test is not yet approved or cleared by the [place] FDA and has been authorized for detection and/or diagnosis of SARS-CoV-2 by FDA under an Emergency Use Authorization (EUA). This EUA will remain in effect (meaning this test can be used) for the duration of the COVID-19 declaration under Section 564(b)(1) of the Act, 21 U.S.C. section 360bbb-3(b)(1), unless the authorization is terminated or revoked.  Performed at [facility], [address], [place], [place] [address]   Surgical pcr screen     Status: None   Collection Time: 09/13/20  4:40 AM   Specimen: Nasal Mucosa; Nasal Swab  Result Value Ref Range Status   MRSA, PCR NEGATIVE NEGATIVE Final   Staphylococcus aureus NEGATIVE NEGATIVE Final    Comment: (NOTE) The Xpert SA Assay (FDA approved for NASAL specimens in patients 65 years of age and older), is one component of a comprehensive surveillance program. It is not intended to diagnose infection nor to guide or monitor treatment. Performed at [facility], [address] [place], [place] [address]     Procedures/Studies: DG Chest 1 View  Result Date: 09/12/2020 CLINICAL DATA:  74 year old female under preoperative evaluation. EXAM: CHEST  1 VIEW COMPARISON:  Chest x-ray 07/09/2017. FINDINGS: Lung volumes are normal. No consolidative airspace disease. No pleural effusions. No pneumothorax. No pulmonary nodule or mass noted. Pulmonary vasculature and the cardiomediastinal silhouette are within normal  limits. Surgical clips project over the right breast likely from prior lumpectomy. Surgical clips also noted in the lower right cervical region, likely from prior hemithyroidectomy. IMPRESSION: 1. No radiographic evidence of acute cardiopulmonary disease. Electronically Signed   By: Vinnie Langton M.D.   On: 09/12/2020 18:33   DG Hip Port Unilat With Pelvis 1V Right  Result Date: 09/13/2020 CLINICAL DATA:  Post hemiarthroplasty EXAM: DG HIP (WITH OR WITHOUT PELVIS) 1V PORT RIGHT COMPARISON:  09/12/2020 FINDINGS: RIGHT hip hemiarthroplasty. No acute fracture dislocation. Prosthetic component appears well position. IMPRESSION: No complication following RIGHT hip hemiarthroplasty Electronically Signed   By: Suzy Bouchard M.D.   On: 09/13/2020 20:05   DG Hip Unilat  With Pelvis 2-3 Views Right  Result Date: 09/12/2020 CLINICAL DATA:  74 year old female with trauma to the right hip. EXAM: DG HIP (WITH OR WITHOUT PELVIS)  2-3V RIGHT COMPARISON:  None. FINDINGS: There is a mildly displaced fracture of the right femoral neck with mild proximal migration of the femoral shaft. The bones are osteopenic. There is no dislocation. Mild bilateral hip osteoarthritic changes. The soft tissues are unremarkable. IMPRESSION: Mildly displaced fracture of the right femoral neck. No dislocation. Electronically Signed   By: Anner Crete M.D.   On: 09/12/2020 18:36    Labs: BNP (last 3 results) No results for input(s): BNP in the last 8760 hours. Basic Metabolic Panel: Recent Labs  Lab 09/12/20 1630 09/13/20 0458 09/13/20 2121 09/14/20 0527 09/15/20 0534  NA 140 139  --  140 138  K 3.6 3.7  --  4.0 3.8  CL 104 105  --  106 104  CO2 26 26  --  26 26  GLUCOSE 101* 119*  --  147* 118*  BUN 23 20  --  20 28*  CREATININE 0.96 0.87 0.88 0.82 0.83  CALCIUM 9.1 8.8*  --  8.7* 8.2*   Liver Function Tests: Recent Labs  Lab 09/12/20 1630  AST 24  ALT 20  ALKPHOS 95  BILITOT 0.7  PROT 7.4  ALBUMIN 4.1    No results for input(s): LIPASE, AMYLASE in the last 168 hours. No results for input(s): AMMONIA in the last 168 hours. CBC: Recent Labs  Lab 09/12/20 1630 09/13/20 0458 09/13/20 2121 09/14/20 0527 09/15/20 0534 09/16/20 0419  WBC 14.4* 10.8* 16.0* 12.0* 10.5 10.8*  NEUTROABS 11.1*  --   --   --   --   --   HGB 12.6 12.0 12.5 11.1* 10.4* 9.8*  HCT 37.0 35.8* 37.4 33.9* 30.9* 29.0*  MCV 92.0 91.6 91.9 93.4 93.4 93.5  PLT 187 169 154 151 148* 147*   Cardiac Enzymes: No results for input(s): CKTOTAL, CKMB, CKMBINDEX, TROPONINI in the last 168 hours. BNP: Invalid input(s): POCBNP CBG: No results for input(s): GLUCAP in the last 168 hours. D-Dimer No results for input(s): DDIMER in the last 72 hours. Hgb A1c No results for input(s): HGBA1C in the last 72 hours. Lipid Profile No results for input(s): CHOL, HDL, LDLCALC, TRIG, CHOLHDL, LDLDIRECT in the last 72 hours. Thyroid function studies No results for input(s): TSH, T4TOTAL, T3FREE, THYROIDAB in the last 72 hours.  Invalid input(s): FREET3 Anemia work up No results for input(s): VITAMINB12, FOLATE, FERRITIN, TIBC, IRON, RETICCTPCT in the last 72 hours. Urinalysis    Component Value Date/Time   COLORURINE YELLOW 10/08/2019 2359   APPEARANCEUR CLOUDY (A) 10/08/2019 2359   LABSPEC 1.018 10/08/2019 2359   PHURINE 5.0 10/08/2019 2359   GLUCOSEU NEGATIVE 10/08/2019 2359   HGBUR SMALL (A) 10/08/2019 2359   BILIRUBINUR Negative 11/04/2019 1111   KETONESUR NEGATIVE 10/08/2019 2359   PROTEINUR Negative 11/04/2019 1111   PROTEINUR NEGATIVE 10/08/2019 2359   UROBILINOGEN 0.2 11/04/2019 1111   UROBILINOGEN 1.0 02/17/2014 1639   NITRITE Negative 11/04/2019 1111   NITRITE NEGATIVE 10/08/2019 2359   LEUKOCYTESUR Negative 11/04/2019 1111   LEUKOCYTESUR SMALL (A) 10/08/2019 2359   Sepsis Labs Invalid input(s): PROCALCITONIN,  WBC,  LACTICIDVEN Microbiology Recent Results (from the past 240 hour(s))  Resp Panel by RT-PCR  (Flu A&B, Covid) Nasopharyngeal Swab     Status: None   Collection Time: 09/12/20  4:30 PM   Specimen: Nasopharyngeal Swab; Nasopharyngeal(NP) swabs in vial transport medium  Result Value Ref Range Status   SARS Coronavirus 2 by RT PCR NEGATIVE NEGATIVE Final    Comment: (NOTE) SARS-CoV-2 target nucleic acids are NOT  DETECTED.  The SARS-CoV-2 RNA is generally detectable in upper respiratory specimens during the acute phase of infection. The lowest concentration of SARS-CoV-2 viral copies this assay can detect is 138 copies/mL. A negative result does not preclude SARS-Cov-2 infection and should not be used as the sole basis for treatment or other patient management decisions. A negative result may occur with  improper specimen collection/handling, submission of specimen other than nasopharyngeal swab, presence of viral mutation(s) within the areas targeted by this assay, and inadequate number of viral copies(<138 copies/mL). A negative result must be combined with clinical observations, patient history, and epidemiological information. The expected result is Negative.  Fact Sheet for Patients:  EntrepreneurPulse.com.au  Fact Sheet for Healthcare Providers:  IncredibleEmployment.be  This test is no t yet approved or cleared by the [place] FDA and  has been authorized for detection and/or diagnosis of SARS-CoV-2 by FDA under an Emergency Use Authorization (EUA). This EUA will remain  in effect (meaning this test can be used) for the duration of the COVID-19 declaration under Section 564(b)(1) of the Act, 21 U.S.C.section 360bbb-3(b)(1), unless the authorization is terminated  or revoked sooner.       Influenza A by PCR NEGATIVE NEGATIVE Final   Influenza B by PCR NEGATIVE NEGATIVE Final    Comment: (NOTE) The Xpert Xpress SARS-CoV-2/FLU/RSV plus assay is intended as an aid in the diagnosis of influenza from Nasopharyngeal swab specimens  and should not be used as a sole basis for treatment. Nasal washings and aspirates are unacceptable for Xpert Xpress SARS-CoV-2/FLU/RSV testing.  Fact Sheet for Patients: EntrepreneurPulse.com.au  Fact Sheet for Healthcare Providers: IncredibleEmployment.be  This test is not yet approved or cleared by the [place] FDA and has been authorized for detection and/or diagnosis of SARS-CoV-2 by FDA under an Emergency Use Authorization (EUA). This EUA will remain in effect (meaning this test can be used) for the duration of the COVID-19 declaration under Section 564(b)(1) of the Act, 21 U.S.C. section 360bbb-3(b)(1), unless the authorization is terminated or revoked.  Performed at [facility], [address] [place], [place] [address]   Surgical pcr screen     Status: None   Collection Time: 09/13/20  4:40 AM   Specimen: Nasal Mucosa; Nasal Swab  Result Value Ref Range Status   MRSA, PCR NEGATIVE NEGATIVE Final   Staphylococcus aureus NEGATIVE NEGATIVE Final    Comment: (NOTE) The Xpert SA Assay (FDA approved for NASAL specimens in patients 88 years of age and older), is one component of a comprehensive surveillance program. It is not intended to diagnose infection nor to guide or monitor treatment. Performed at [facility], [address] [place], [place] [address]      Time coordinating discharge: 25 minutes  SIGNED: Antonieta Pert, MD  [facility] 09/18/2020, 10:29 AM  If 7PM-7AM, please contact night-coverage [email or web address]

## 2020-09-17 NOTE — Care Management Important Message (Signed)
Important Message  Patient Details  Name: Martha Evans MRN: [id number] Date of Birth: 10-29-1945   Medicare Important Message Given:  Yes     Juliann Pulse A Merrie Epler 09/17/2020, 11:08 AM

## 2020-09-18 DIAGNOSIS — D649 Anemia, unspecified: Secondary | ICD-10-CM | POA: Diagnosis not present

## 2020-09-18 DIAGNOSIS — R5381 Other malaise: Secondary | ICD-10-CM | POA: Diagnosis not present

## 2020-09-18 DIAGNOSIS — M6281 Muscle weakness (generalized): Secondary | ICD-10-CM | POA: Diagnosis not present

## 2020-09-18 DIAGNOSIS — W19XXXD Unspecified fall, subsequent encounter: Secondary | ICD-10-CM | POA: Diagnosis not present

## 2020-09-18 DIAGNOSIS — K219 Gastro-esophageal reflux disease without esophagitis: Secondary | ICD-10-CM | POA: Diagnosis not present

## 2020-09-18 DIAGNOSIS — M8580 Other specified disorders of bone density and structure, unspecified site: Secondary | ICD-10-CM | POA: Diagnosis not present

## 2020-09-18 DIAGNOSIS — R279 Unspecified lack of coordination: Secondary | ICD-10-CM | POA: Diagnosis not present

## 2020-09-18 DIAGNOSIS — E669 Obesity, unspecified: Secondary | ICD-10-CM | POA: Diagnosis not present

## 2020-09-18 DIAGNOSIS — S72009A Fracture of unspecified part of neck of unspecified femur, initial encounter for closed fracture: Secondary | ICD-10-CM | POA: Diagnosis not present

## 2020-09-18 DIAGNOSIS — S72001D Fracture of unspecified part of neck of right femur, subsequent encounter for closed fracture with routine healing: Secondary | ICD-10-CM | POA: Diagnosis not present

## 2020-09-18 DIAGNOSIS — Z853 Personal history of malignant neoplasm of breast: Secondary | ICD-10-CM | POA: Diagnosis not present

## 2020-09-18 DIAGNOSIS — Z8781 Personal history of (healed) traumatic fracture: Secondary | ICD-10-CM | POA: Diagnosis not present

## 2020-09-18 DIAGNOSIS — Z96641 Presence of right artificial hip joint: Secondary | ICD-10-CM | POA: Diagnosis not present

## 2020-09-18 DIAGNOSIS — I10 Essential (primary) hypertension: Secondary | ICD-10-CM | POA: Diagnosis not present

## 2020-09-18 DIAGNOSIS — E7800 Pure hypercholesterolemia, unspecified: Secondary | ICD-10-CM | POA: Diagnosis not present

## 2020-09-18 DIAGNOSIS — S72001A Fracture of unspecified part of neck of right femur, initial encounter for closed fracture: Secondary | ICD-10-CM | POA: Diagnosis not present

## 2020-09-18 DIAGNOSIS — E58 Dietary calcium deficiency: Secondary | ICD-10-CM | POA: Diagnosis not present

## 2020-09-18 LAB — SARS CORONAVIRUS 2 BY RT PCR (HOSPITAL ORDER, PERFORMED IN ~~LOC~~ HOSPITAL LAB): SARS Coronavirus 2: NEGATIVE

## 2020-09-18 MED ORDER — DIMENHYDRINATE 50 MG PO TABS: 50.0000 mg | ORAL_TABLET | Freq: Every evening | ORAL | 0 refills | Status: DC | PRN

## 2020-09-18 MED ORDER — ASPIRIN EC 81 MG PO TBEC: 81.0000 mg | DELAYED_RELEASE_TABLET | Freq: Two times a day (BID) | ORAL | 0 refills | Status: AC

## 2020-09-18 NOTE — Plan of Care (Signed)
Pt stable w/o any c/o or issues,was assisted to void using the [facility] after breakfast.Pt tolerating transfers and denies any pain or discomfort.Pt progressing with care plans. Appetite and fluid intake good.R Hip dressing c/d/I.no verbal c/o at this time.

## 2020-09-18 NOTE — TOC Progression Note (Signed)
Transition of Care [facility]) - Progression Note    Patient Details  Name: Martha Evans MRN: [id number] Date of Birth: 08/02/1946  Transition of Care [facility]) CM/SW [place], RN Phone Number: 09/18/2020, 8:17 AM  Clinical Narrative:   RNCM met with patient in room to discuss bed offer for [facility]. Patient is agreeable to accepting bed and reports she is eager to move on and continue her therapy. RNCM reached out to [employer] and started insurance authorization.     Expected Discharge Plan: [place] Barriers to Discharge: No Barriers Identified  Expected Discharge Plan and Services Expected Discharge Plan: [place] Choice: [place] arrangements for the past 2 months: Single Family Home                                       Social Determinants of Health (SDOH) Interventions    Readmission Risk Interventions No flowsheet data found.

## 2020-09-18 NOTE — TOC Progression Note (Signed)
Transition of Care [facility]) - Progression Note    Patient Details  Name: Martha Evans MRN: [id number] Date of Birth: Sep 27, 1946  Transition of Care [facility]) CM/SW [place], RN Phone Number: 09/18/2020, 2:01 PM  Clinical Narrative:   RNCM received insurance authorization for patient from [facility] for SNF for 7 days with an auth number of [id number] and for ambulance transport through [facility] with an authorization number of [id number].     Expected Discharge Plan: [place] Barriers to Discharge: No Barriers Identified  Expected Discharge Plan and Services Expected Discharge Plan: [place] Choice: [place] arrangements for the past 2 months: Single Family Home                                       Social Determinants of Health (SDOH) Interventions    Readmission Risk Interventions No flowsheet data found.

## 2020-09-18 NOTE — Progress Notes (Signed)
PT Cancellation Note  Patient Details Name: Julita Ozbun MRN: [id number] DOB: 01/16/1946   Cancelled Treatment:    Reason Eval/Treat Not Completed: Other (comment)   Pt offered sessions today.  Stated she is awaiting transport to SNF and declined session. Stated she has been up with nursing to commode and doing her HEP.  Will continue as appropriate if she does not leave today.   Chesley Noon 09/18/2020, 2:39 PM

## 2020-09-18 NOTE — Plan of Care (Signed)

## 2020-09-19 ENCOUNTER — Telehealth: Payer: Self-pay

## 2020-09-19 DIAGNOSIS — Z8781 Personal history of (healed) traumatic fracture: Secondary | ICD-10-CM | POA: Diagnosis not present

## 2020-09-19 DIAGNOSIS — I10 Essential (primary) hypertension: Secondary | ICD-10-CM | POA: Diagnosis not present

## 2020-09-19 DIAGNOSIS — E669 Obesity, unspecified: Secondary | ICD-10-CM | POA: Diagnosis not present

## 2020-09-19 NOTE — Telephone Encounter (Signed)
Patient was recently discharged from the hospital on 09/18/20.  No TCM completed, patient does not qualify for TCM services due to being d/c to a SNF.  Per discharge summary patient needs follow up with PCP. HFU scheduled for 10/03/20. This is out of the 2 week window. FYI!

## 2020-09-24 ENCOUNTER — Telehealth: Payer: Self-pay

## 2020-09-24 DIAGNOSIS — S72001A Fracture of unspecified part of neck of right femur, initial encounter for closed fracture: Secondary | ICD-10-CM

## 2020-09-24 NOTE — Telephone Encounter (Signed)
Ok to put in a referral for PT, skilled nursing, etc for the patient? Please advise. Thanks!

## 2020-09-24 NOTE — Telephone Encounter (Signed)
Order placed

## 2020-09-24 NOTE — Telephone Encounter (Signed)
Copied from [place] [id number]. Topic: General - Call Back - No Documentation >> Sep 24, 2020 12:30 PM Erick Blinks wrote: Reason for CRM: Pt's husband called and is requesting a call back from nurse, requesting orders for PT/Skilled nursing for bathing assistance/ and also wants a walker for walking assistance. Pt's husband will be bringing her home today from the rehab facility.   Best contact [phone]

## 2020-09-24 NOTE — Telephone Encounter (Signed)
That's fine. Diagnosis right hip fracture.

## 2020-09-25 NOTE — Telephone Encounter (Signed)
See message below. Patient's husband would like an update on referral.

## 2020-09-25 NOTE — Telephone Encounter (Signed)
Please advise 

## 2020-09-25 NOTE — Telephone Encounter (Addendum)
Spouse Mr. Lance Bosch informed of the below and would like to know where orders mentioned below will be placed please call today  # [phone].

## 2020-09-27 DIAGNOSIS — S72011A Unspecified intracapsular fracture of right femur, initial encounter for closed fracture: Secondary | ICD-10-CM | POA: Diagnosis not present

## 2020-10-01 DIAGNOSIS — M160 Bilateral primary osteoarthritis of hip: Secondary | ICD-10-CM | POA: Diagnosis not present

## 2020-10-01 DIAGNOSIS — Z853 Personal history of malignant neoplasm of breast: Secondary | ICD-10-CM | POA: Diagnosis not present

## 2020-10-01 DIAGNOSIS — M8580 Other specified disorders of bone density and structure, unspecified site: Secondary | ICD-10-CM | POA: Diagnosis not present

## 2020-10-01 DIAGNOSIS — E7800 Pure hypercholesterolemia, unspecified: Secondary | ICD-10-CM | POA: Diagnosis not present

## 2020-10-01 DIAGNOSIS — I10 Essential (primary) hypertension: Secondary | ICD-10-CM | POA: Diagnosis not present

## 2020-10-01 DIAGNOSIS — S72011D Unspecified intracapsular fracture of right femur, subsequent encounter for closed fracture with routine healing: Secondary | ICD-10-CM | POA: Diagnosis not present

## 2020-10-01 DIAGNOSIS — Z9049 Acquired absence of other specified parts of digestive tract: Secondary | ICD-10-CM | POA: Diagnosis not present

## 2020-10-01 DIAGNOSIS — Z96641 Presence of right artificial hip joint: Secondary | ICD-10-CM | POA: Diagnosis not present

## 2020-10-01 DIAGNOSIS — K219 Gastro-esophageal reflux disease without esophagitis: Secondary | ICD-10-CM | POA: Diagnosis not present

## 2020-10-03 ENCOUNTER — Inpatient Hospital Stay: Payer: PPO | Admitting: Family Medicine

## 2020-10-03 NOTE — Progress Notes (Deleted)
      Established patient visit   Patient: Martha Evans   DOB: 09-14-1946   75 y.o. Female  MRN: [id number] Visit Date: 10/03/2020  Today's healthcare provider: Mila Merry, MD   No chief complaint on file.  Subjective    HPI  Follow up Hospitalization  Patient was admitted to [facility] on 09/12/2020 and discharged on 09/18/2020. She was treated for closed fracture of right hip secondary to mechanical fall..     Treatment for this included righthip hemiarthroplasty done by orthopedics on 09/13/2020. Patient was discharged to skilled nursing facility.  Per discharge summary, patient was to follow up with PCP in 1-2 weeks to have BMP/CBC checked.  Telephone follow up was not done on due due to being discharged to SNF. She reports {excellent/good/fair:19992} compliance with treatment. She reports this condition is {resolved/improved/worsened:23923}.  ----------------------------------------------------------------------------------------- -   {Show patient history (optional):23778::" "}   Medications: Outpatient Medications Prior to Visit  Medication Sig  . acetaminophen (TYLENOL) 325 MG tablet Take 1-2 tablets (325-650 mg total) by mouth every 6 (six) hours as needed for mild pain (pain score 1-3 or temp > 100.5).  Marland Kitchen aspirin EC 81 MG tablet Take 1 tablet (81 mg total) by mouth 2 (two) times daily. Swallow whole.  . calcium carbonate (TUMS - DOSED IN MG ELEMENTAL CALCIUM) 500 MG chewable tablet Chew 1 tablet (200 mg of elemental calcium total) by mouth 3 (three) times daily.  Marland Kitchen dimenhyDRINATE (DRAMAMINE) 50 MG tablet Take 1 tablet (50 mg total) by mouth at bedtime as needed (pt takes for sleep @ home).  . ferrous sulfate 325 (65 FE) MG tablet Take 1 tablet (325 mg total) by mouth daily with breakfast.  . nitrofurantoin (MACRODANTIN) 50 MG capsule Take 1 capsule (50 mg total) by mouth at bedtime.  Marland Kitchen omeprazole (PRILOSEC) 20 MG capsule Take 20 mg by mouth 2 (two) times daily  before a meal.   . telmisartan (MICARDIS) 40 MG tablet Take 40 mg by mouth at bedtime.   . traMADol (ULTRAM) 50 MG tablet Take 1 tablet (50 mg total) by mouth every 6 (six) hours as needed for up to 6 doses for moderate pain.   No facility-administered medications prior to visit.    Review of Systems  {Heme  Chem  Endocrine  Serology  Results Review (optional):23779::" "}  Objective    There were no vitals taken for this visit. {Show previous vital signs (optional):23777::" "}  Physical Exam  ***  No results found for any visits on 10/03/20.  Assessment & Plan     ***  No follow-ups on file.      {provider attestation***:1}   Mila Merry, MD  [facility] [phone] (phone) [phone] (fax)  [facility]

## 2020-10-09 DIAGNOSIS — M25551 Pain in right hip: Secondary | ICD-10-CM | POA: Diagnosis not present

## 2020-10-09 DIAGNOSIS — M25651 Stiffness of right hip, not elsewhere classified: Secondary | ICD-10-CM | POA: Diagnosis not present

## 2020-10-12 DIAGNOSIS — M25651 Stiffness of right hip, not elsewhere classified: Secondary | ICD-10-CM | POA: Diagnosis not present

## 2020-10-12 DIAGNOSIS — M25551 Pain in right hip: Secondary | ICD-10-CM | POA: Diagnosis not present

## 2020-10-17 DIAGNOSIS — M25551 Pain in right hip: Secondary | ICD-10-CM | POA: Diagnosis not present

## 2020-10-17 DIAGNOSIS — M25651 Stiffness of right hip, not elsewhere classified: Secondary | ICD-10-CM | POA: Diagnosis not present

## 2020-10-19 DIAGNOSIS — M25651 Stiffness of right hip, not elsewhere classified: Secondary | ICD-10-CM | POA: Diagnosis not present

## 2020-10-19 DIAGNOSIS — M25551 Pain in right hip: Secondary | ICD-10-CM | POA: Diagnosis not present

## 2020-10-23 DIAGNOSIS — M25551 Pain in right hip: Secondary | ICD-10-CM | POA: Diagnosis not present

## 2020-10-23 DIAGNOSIS — M25651 Stiffness of right hip, not elsewhere classified: Secondary | ICD-10-CM | POA: Diagnosis not present

## 2020-10-26 ENCOUNTER — Ambulatory Visit (INDEPENDENT_AMBULATORY_CARE_PROVIDER_SITE_OTHER): Payer: PPO | Admitting: Physician Assistant

## 2020-10-26 ENCOUNTER — Other Ambulatory Visit: Payer: Self-pay

## 2020-10-26 ENCOUNTER — Encounter: Payer: Self-pay | Admitting: Physician Assistant

## 2020-10-26 VITALS — BP 121/82 | HR 62 | Temp 98.3°F | Wt 192.0 lb

## 2020-10-26 DIAGNOSIS — M25651 Stiffness of right hip, not elsewhere classified: Secondary | ICD-10-CM | POA: Diagnosis not present

## 2020-10-26 DIAGNOSIS — N3090 Cystitis, unspecified without hematuria: Secondary | ICD-10-CM

## 2020-10-26 DIAGNOSIS — M25551 Pain in right hip: Secondary | ICD-10-CM | POA: Diagnosis not present

## 2020-10-26 LAB — POCT URINALYSIS DIPSTICK
Bilirubin, UA: NEGATIVE
Blood, UA: NEGATIVE
Glucose, UA: NEGATIVE
Ketones, UA: NEGATIVE
Leukocytes, UA: NEGATIVE
Nitrite, UA: NEGATIVE
Protein, UA: NEGATIVE
Spec Grav, UA: 1.025 (ref 1.010–1.025)
Urobilinogen, UA: 0.2 E.U./dL
pH, UA: 5 (ref 5.0–8.0)

## 2020-10-26 MED ORDER — SULFAMETHOXAZOLE-TRIMETHOPRIM 800-160 MG PO TABS: 1.0000 | ORAL_TABLET | Freq: Two times a day (BID) | ORAL | 0 refills | Status: DC

## 2020-10-26 NOTE — Patient Instructions (Addendum)
Urinary Tract Infection, Adult A urinary tract infection (UTI) is an infection of any part of the urinary tract. The urinary tract includes:  The kidneys.  The ureters.  The bladder.  The urethra. These organs make, store, and get rid of pee (urine) in the body. What are the causes? This infection is caused by germs (bacteria) in your genital area. These germs grow and cause swelling (inflammation) of your urinary tract. What increases the risk? The following factors may make you more likely to develop this condition:  Using a small, thin tube (catheter) to drain pee.  Not being able to control when you pee or poop (incontinence).  Being female. If you are female, these things can increase the risk: ? Using these methods to prevent pregnancy:  A medicine that kills sperm (spermicide).  A device that blocks sperm (diaphragm). ? Having low levels of a female hormone (estrogen). ? Being pregnant. You are more likely to develop this condition if:  You have genes that add to your risk.  You are sexually active.  You take antibiotic medicines.  You have trouble peeing because of: ? A prostate that is bigger than normal, if you are female. ? A blockage in the part of your body that drains pee from the bladder. ? A kidney stone. ? A nerve condition that affects your bladder. ? Not getting enough to drink. ? Not peeing often enough.  You have other conditions, such as: ? Diabetes. ? A weak disease-fighting system (immune system). ? Sickle cell disease. ? Gout. ? Injury of the spine. What are the signs or symptoms? Symptoms of this condition include:  Needing to pee right away.  Peeing small amounts often.  Pain or burning when peeing.  Blood in the pee.  Pee that smells bad or not like normal.  Trouble peeing.  Pee that is cloudy.  Fluid coming from the vagina, if you are female.  Pain in the belly or lower back. Other symptoms include:  Vomiting.  Not  feeling hungry.  Feeling mixed up (confused). This may be the first symptom in older adults.  Being tired and grouchy (irritable).  A fever.  Watery poop (diarrhea). How is this treated?  Taking antibiotic medicine.  Taking other medicines.  Drinking enough water. In some cases, you may need to see a specialist. Follow these instructions at home: Medicines  Take over-the-counter and prescription medicines only as told by your doctor.  If you were prescribed an antibiotic medicine, take it as told by your doctor. Do not stop taking it even if you start to feel better. General instructions  Make sure you: ? Pee until your bladder is empty. ? Do not hold pee for a long time. ? Empty your bladder after sex. ? Wipe from front to back after peeing or pooping if you are a female. Use each tissue one time when you wipe.  Drink enough fluid to keep your pee pale yellow.  Keep all follow-up visits.   Contact a doctor if:  You do not get better after 1-2 days.  Your symptoms go away and then come back. Get help right away if:  You have very bad back pain.  You have very bad pain in your lower belly.  You have a fever.  You have chills.  You feeling like you will vomit or you vomit. Summary  A urinary tract infection (UTI) is an infection of any part of the urinary tract.  This condition is caused by   germs in your genital area.  There are many risk factors for a UTI.  Treatment includes antibiotic medicines.  Drink enough fluid to keep your pee pale yellow. This information is not intended to replace advice given to you by your health care provider. Make sure you discuss any questions you have with your health care provider. Document Revised: 04/27/2020 Document Reviewed: 04/27/2020 Elsevier Patient Education  2021 Elsevier Inc.  Urinary Tract Infection, Adult A urinary tract infection (UTI) is an infection of any part of the urinary tract. The urinary tract  includes:  The kidneys.  The ureters.  The bladder.  The urethra. These organs make, store, and get rid of pee (urine) in the body. What are the causes? This infection is caused by germs (bacteria) in your genital area. These germs grow and cause swelling (inflammation) of your urinary tract. What increases the risk? The following factors may make you more likely to develop this condition:  Using a small, thin tube (catheter) to drain pee.  Not being able to control when you pee or poop (incontinence).  Being female. If you are female, these things can increase the risk: ? Using these methods to prevent pregnancy:  A medicine that kills sperm (spermicide).  A device that blocks sperm (diaphragm). ? Having low levels of a female hormone (estrogen). ? Being pregnant. You are more likely to develop this condition if:  You have genes that add to your risk.  You are sexually active.  You take antibiotic medicines.  You have trouble peeing because of: ? A prostate that is bigger than normal, if you are female. ? A blockage in the part of your body that drains pee from the bladder. ? A kidney stone. ? A nerve condition that affects your bladder. ? Not getting enough to drink. ? Not peeing often enough.  You have other conditions, such as: ? Diabetes. ? A weak disease-fighting system (immune system). ? Sickle cell disease. ? Gout. ? Injury of the spine. What are the signs or symptoms? Symptoms of this condition include:  Needing to pee right away.  Peeing small amounts often.  Pain or burning when peeing.  Blood in the pee.  Pee that smells bad or not like normal.  Trouble peeing.  Pee that is cloudy.  Fluid coming from the vagina, if you are female.  Pain in the belly or lower back. Other symptoms include:  Vomiting.  Not feeling hungry.  Feeling mixed up (confused). This may be the first symptom in older adults.  Being tired and grouchy  (irritable).  A fever.  Watery poop (diarrhea). How is this treated?  Taking antibiotic medicine.  Taking other medicines.  Drinking enough water. In some cases, you may need to see a specialist. Follow these instructions at home: Medicines  Take over-the-counter and prescription medicines only as told by your doctor.  If you were prescribed an antibiotic medicine, take it as told by your doctor. Do not stop taking it even if you start to feel better. General instructions  Make sure you: ? Pee until your bladder is empty. ? Do not hold pee for a long time. ? Empty your bladder after sex. ? Wipe from front to back after peeing or pooping if you are a female. Use each tissue one time when you wipe.  Drink enough fluid to keep your pee pale yellow.  Keep all follow-up visits.   Contact a doctor if:  You do not get better after 1-2 days.    Your symptoms go away and then come back. Get help right away if:  You have very bad back pain.  You have very bad pain in your lower belly.  You have a fever.  You have chills.  You feeling like you will vomit or you vomit. Summary  A urinary tract infection (UTI) is an infection of any part of the urinary tract.  This condition is caused by germs in your genital area.  There are many risk factors for a UTI.  Treatment includes antibiotic medicines.  Drink enough fluid to keep your pee pale yellow. This information is not intended to replace advice given to you by your health care provider. Make sure you discuss any questions you have with your health care provider. Document Revised: 04/27/2020 Document Reviewed: 04/27/2020 Elsevier Patient Education  2021 Elsevier Inc.  

## 2020-10-26 NOTE — Progress Notes (Signed)
Established patient visit   Patient: Martha Evans   DOB: 02/26/1946   75 y.o. Female  MRN: [id number] Visit Date: 10/26/2020  Today's healthcare provider: Mar Daring, PA-C   Chief Complaint  Patient presents with  . Urinary Tract Infection   Subjective    Urinary Tract Infection  This is a new problem. The current episode started more than 1 month ago. The problem has been gradually improving. There has been no fever. Associated symptoms include frequency, hematuria and urgency. Pertinent negatives include no chills.    Patient reports she has been pushing fluids and tried cranberry juice and feels symptoms have improved slightly, but continue to return.   Patient Active Problem List   Diagnosis Date Noted  . Hip fracture ([place]) 09/12/2020  . Osteopenia 09/12/2020  . PVC's (premature ventricular contractions) 02/06/2020  . Allergic rhinitis 10/11/2015  . Colon polyp 10/11/2015  . 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 Vitamin D deficiency 10/11/2015  . Cardiac enlargement 05/24/2015  . Acid reflux 05/24/2015  . Hypercholesteremia 05/24/2015  . BP (high blood pressure) 05/24/2015  . Hot flashes 11/22/2014  . Arthralgia 11/22/2014  . S/P colostomy takedown 09/15/14 09/15/2014  . History of diverticular abscess of colon 07/19/2014  . 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 ARF (acute renal failure) ([place]) 02/17/2014  . Malignant neoplasm of lower-outer quadrant of right breast of female, estrogen receptor positive ([place]) 12/07/2013  . Multiple thyroid nodules, left lobe 07/25/2013  . History of thyroid lobectomy, right 07/25/2013  . Herpes zoster without complication 55/73/2202   Past Medical History:  Diagnosis Date  . Allergy   . Cancer [facility])    breast cancer right breast  . Cardiomegaly   . Complication of anesthesia    was hard to intubate with thyroid surgery-99  . Difficult intubation 1999   when had thyroid lobectomy  . Dysrhythmia     irregular reuglar heart beat   . GERD (gastroesophageal reflux disease)   . History of chicken pox   . History of measles   . Hypertension   . Personal history of chemotherapy   . Personal history of radiation therapy   . Wears hearing aid    right   Social History   Tobacco Use  . Smoking status: Never Smoker  . Smokeless tobacco: Never Used  Vaping Use  . Vaping Use: Never used  Substance Use Topics  . Alcohol use: Yes    Comment: 1/monthly  . Drug use: No   Allergies  Allergen Reactions  . Codeine Nausea Only and Nausea And Vomiting  . Codeine Sulfate Nausea And Vomiting  . Tramadol Nausea Only    Nausea   . Hydrocodone Nausea Only  . Oxycodone Nausea Only     Medications: Outpatient Medications Prior to Visit  Medication Sig  . acetaminophen (TYLENOL) 325 MG tablet Take 1-2 tablets (325-650 mg total) by mouth every 6 (six) hours as needed for mild pain (pain score 1-3 or temp > 100.5).  Marland Kitchen aspirin EC 81 MG tablet Take 1 tablet (81 mg total) by mouth 2 (two) times daily. Swallow whole.  . dimenhyDRINATE (DRAMAMINE) 50 MG tablet Take 1 tablet (50 mg total) by mouth at bedtime as needed (pt takes for sleep @ home).  . nitrofurantoin (MACRODANTIN) 50 MG capsule Take 1 capsule (50 mg total) by mouth at bedtime.  Marland Kitchen omeprazole (PRILOSEC) 20 MG capsule Take 20 mg by mouth 2 (two) times daily before a meal.   .  telmisartan (MICARDIS) 40 MG tablet Take 40 mg by mouth at bedtime.   . traMADol (ULTRAM) 50 MG tablet Take 1 tablet (50 mg total) by mouth every 6 (six) hours as needed for up to 6 doses for moderate pain.  . ferrous sulfate 325 (65 FE) MG tablet Take 1 tablet (325 mg total) by mouth daily with breakfast.   No facility-administered medications prior to visit.    Review of Systems  Constitutional: Positive for fatigue. Negative for activity change, appetite change, chills, diaphoresis, fever and unexpected weight change.  Respiratory: Negative.   Cardiovascular:  Negative.   Gastrointestinal: Negative.   Genitourinary: Positive for dysuria, frequency, hematuria and urgency. Negative for decreased urine volume, difficulty urinating, dyspareunia, vaginal bleeding, vaginal discharge and vaginal pain.  Musculoskeletal: Positive for back pain (Right side).    @AMBLABREVIEWLINK @  Objective    BP 121/82 (BP Location: Left Arm, Patient Position: Sitting, Cuff Size: Large)   Pulse 62   Temp 98.3 F (36.8 C) (Oral)   Wt 192 lb (87.1 kg)   BMI 30.99 kg/m    Physical Exam Vitals reviewed.  Constitutional:      General: She is not in acute distress.    Appearance: Normal appearance. She is well-developed and well-nourished. She is obese. She is not ill-appearing or diaphoretic.  Cardiovascular:     Rate and Rhythm: Normal rate and regular rhythm.     Heart sounds: Normal heart sounds. No murmur heard. No friction rub. No gallop.   Pulmonary:     Effort: Pulmonary effort is normal. No respiratory distress.     Breath sounds: Normal breath sounds. No wheezing or rales.  Abdominal:     General: Abdomen is flat. Bowel sounds are normal. There is no distension.     Palpations: Abdomen is soft.     Tenderness: There is no abdominal tenderness. There is no right CVA tenderness or left CVA tenderness.  Musculoskeletal:     Cervical back: Normal range of motion and neck supple.  Neurological:     Mental Status: She is alert.     Results for orders placed or performed in visit on 10/26/20  POCT urinalysis dipstick  Result Value Ref Range   Color, UA     Clarity, UA     Glucose, UA Negative Negative   Bilirubin, UA Negative    Ketones, UA Negative    Spec Grav, UA 1.025 1.010 - 1.025   Blood, UA Negative    pH, UA 5.0 5.0 - 8.0   Protein, UA Negative Negative   Urobilinogen, UA 0.2 0.2 or 1.0 E.U./dL   Nitrite, UA Negative    Leukocytes, UA Negative Negative   Appearance     Odor      Assessment & Plan     1. Cystitis No systemic  symptoms. UA unremarkable but since patient having symptoms will treat with Bactrim as below x 3 days. Culture sent off and will await results. Will extend treatment or change pending culture results.  - POCT urinalysis dipstick - CULTURE, URINE COMPREHENSIVE - sulfamethoxazole-trimethoprim (BACTRIM DS) 800-160 MG tablet; Take 1 tablet by mouth 2 (two) times daily.  Dispense: 6 tablet; Refill: 0   No follow-ups on file.      Reynolds Bowl, PA-C, have reviewed all documentation for this visit. The documentation on 10/28/20 for the exam, diagnosis, procedures, and orders are all accurate and complete.   Rubye Beach  [facility] [phone] (phone) [phone] (fax)  [facility]  

## 2020-10-29 DIAGNOSIS — M25651 Stiffness of right hip, not elsewhere classified: Secondary | ICD-10-CM | POA: Diagnosis not present

## 2020-10-29 DIAGNOSIS — M25551 Pain in right hip: Secondary | ICD-10-CM | POA: Diagnosis not present

## 2020-10-29 DIAGNOSIS — S72011A Unspecified intracapsular fracture of right femur, initial encounter for closed fracture: Secondary | ICD-10-CM | POA: Diagnosis not present

## 2020-10-29 LAB — CULTURE, URINE COMPREHENSIVE

## 2020-11-07 DIAGNOSIS — M25651 Stiffness of right hip, not elsewhere classified: Secondary | ICD-10-CM | POA: Diagnosis not present

## 2020-11-07 DIAGNOSIS — M25551 Pain in right hip: Secondary | ICD-10-CM | POA: Diagnosis not present

## 2020-11-14 DIAGNOSIS — M25551 Pain in right hip: Secondary | ICD-10-CM | POA: Diagnosis not present

## 2020-11-14 DIAGNOSIS — M25651 Stiffness of right hip, not elsewhere classified: Secondary | ICD-10-CM | POA: Diagnosis not present

## 2020-11-16 DIAGNOSIS — M25551 Pain in right hip: Secondary | ICD-10-CM | POA: Diagnosis not present

## 2020-11-16 DIAGNOSIS — M25651 Stiffness of right hip, not elsewhere classified: Secondary | ICD-10-CM | POA: Diagnosis not present

## 2020-11-21 DIAGNOSIS — M25551 Pain in right hip: Secondary | ICD-10-CM | POA: Diagnosis not present

## 2020-11-21 DIAGNOSIS — M25651 Stiffness of right hip, not elsewhere classified: Secondary | ICD-10-CM | POA: Diagnosis not present

## 2020-11-23 DIAGNOSIS — M25551 Pain in right hip: Secondary | ICD-10-CM | POA: Diagnosis not present

## 2020-11-23 DIAGNOSIS — M25651 Stiffness of right hip, not elsewhere classified: Secondary | ICD-10-CM | POA: Diagnosis not present

## 2020-11-27 DIAGNOSIS — M25651 Stiffness of right hip, not elsewhere classified: Secondary | ICD-10-CM | POA: Diagnosis not present

## 2020-11-27 DIAGNOSIS — M25551 Pain in right hip: Secondary | ICD-10-CM | POA: Diagnosis not present

## 2020-11-30 DIAGNOSIS — M25551 Pain in right hip: Secondary | ICD-10-CM | POA: Diagnosis not present

## 2020-11-30 DIAGNOSIS — M25651 Stiffness of right hip, not elsewhere classified: Secondary | ICD-10-CM | POA: Diagnosis not present

## 2020-12-04 DIAGNOSIS — M25651 Stiffness of right hip, not elsewhere classified: Secondary | ICD-10-CM | POA: Diagnosis not present

## 2020-12-04 DIAGNOSIS — M25551 Pain in right hip: Secondary | ICD-10-CM | POA: Diagnosis not present

## 2020-12-06 DIAGNOSIS — M25551 Pain in right hip: Secondary | ICD-10-CM | POA: Diagnosis not present

## 2020-12-06 DIAGNOSIS — M25651 Stiffness of right hip, not elsewhere classified: Secondary | ICD-10-CM | POA: Diagnosis not present

## 2020-12-10 DIAGNOSIS — M25651 Stiffness of right hip, not elsewhere classified: Secondary | ICD-10-CM | POA: Diagnosis not present

## 2020-12-10 DIAGNOSIS — M25551 Pain in right hip: Secondary | ICD-10-CM | POA: Diagnosis not present

## 2020-12-12 DIAGNOSIS — M25651 Stiffness of right hip, not elsewhere classified: Secondary | ICD-10-CM | POA: Diagnosis not present

## 2020-12-12 DIAGNOSIS — S72011A Unspecified intracapsular fracture of right femur, initial encounter for closed fracture: Secondary | ICD-10-CM | POA: Diagnosis not present

## 2020-12-12 DIAGNOSIS — M25551 Pain in right hip: Secondary | ICD-10-CM | POA: Diagnosis not present

## 2020-12-19 DIAGNOSIS — M25651 Stiffness of right hip, not elsewhere classified: Secondary | ICD-10-CM | POA: Diagnosis not present

## 2020-12-19 DIAGNOSIS — M25551 Pain in right hip: Secondary | ICD-10-CM | POA: Diagnosis not present

## 2020-12-21 ENCOUNTER — Other Ambulatory Visit: Payer: Self-pay | Admitting: Surgery

## 2020-12-21 DIAGNOSIS — Z1231 Encounter for screening mammogram for malignant neoplasm of breast: Secondary | ICD-10-CM

## 2021-01-14 ENCOUNTER — Telehealth: Payer: Self-pay | Admitting: Family Medicine

## 2021-01-14 DIAGNOSIS — N390 Urinary tract infection, site not specified: Secondary | ICD-10-CM

## 2021-01-14 MED ORDER — NITROFURANTOIN MACROCRYSTAL 50 MG PO CAPS: 50.0000 mg | ORAL_CAPSULE | Freq: Every day | ORAL | 1 refills | Status: DC

## 2021-01-14 NOTE — Telephone Encounter (Signed)
Medication Refill - Medication: Macrodantin generic 50 mg 90 dy with refill.  She takes this everyday for recurring UTI's  Has the patient contacted their pharmacy? No.  No refills left (Agent: If no, request that the patient contact the pharmacy for the refill.) (Agent: If yes, when and what did the pharmacy advise?)  Preferred Pharmacy (with phone number or street name): [facility]  [facility] [place] [facility] Agent: Please be advised that RX refills may take up to 3 business days. We ask that you follow-up with your pharmacy.

## 2021-02-13 ENCOUNTER — Other Ambulatory Visit: Payer: Self-pay

## 2021-02-13 ENCOUNTER — Ambulatory Visit
Admission: RE | Admit: 2021-02-13 | Discharge: 2021-02-13 | Disposition: A | Discharge status: Home / Self Care | Payer: PPO | Source: Ambulatory Visit | Attending: Surgery | Admitting: Surgery

## 2021-02-13 DIAGNOSIS — Z1231 Encounter for screening mammogram for malignant neoplasm of breast: Secondary | ICD-10-CM | POA: Diagnosis not present

## 2021-02-14 DIAGNOSIS — E7800 Pure hypercholesterolemia, unspecified: Secondary | ICD-10-CM | POA: Diagnosis not present

## 2021-02-14 DIAGNOSIS — R0602 Shortness of breath: Secondary | ICD-10-CM | POA: Diagnosis not present

## 2021-02-14 DIAGNOSIS — R0600 Dyspnea, unspecified: Secondary | ICD-10-CM | POA: Diagnosis not present

## 2021-02-14 DIAGNOSIS — I493 Ventricular premature depolarization: Secondary | ICD-10-CM | POA: Diagnosis not present

## 2021-02-14 DIAGNOSIS — I10 Essential (primary) hypertension: Secondary | ICD-10-CM | POA: Diagnosis not present

## 2021-02-14 DIAGNOSIS — R053 Chronic cough: Secondary | ICD-10-CM | POA: Diagnosis not present

## 2021-02-14 DIAGNOSIS — I517 Cardiomegaly: Secondary | ICD-10-CM | POA: Diagnosis not present

## 2021-03-12 ENCOUNTER — Other Ambulatory Visit: Payer: Self-pay | Admitting: Surgery

## 2021-03-12 DIAGNOSIS — E041 Nontoxic single thyroid nodule: Secondary | ICD-10-CM

## 2021-03-15 DIAGNOSIS — R0600 Dyspnea, unspecified: Secondary | ICD-10-CM | POA: Diagnosis not present

## 2021-03-15 DIAGNOSIS — R0602 Shortness of breath: Secondary | ICD-10-CM | POA: Diagnosis not present

## 2021-03-20 DIAGNOSIS — E7800 Pure hypercholesterolemia, unspecified: Secondary | ICD-10-CM | POA: Diagnosis not present

## 2021-03-20 DIAGNOSIS — I493 Ventricular premature depolarization: Secondary | ICD-10-CM | POA: Diagnosis not present

## 2021-03-20 DIAGNOSIS — E669 Obesity, unspecified: Secondary | ICD-10-CM | POA: Diagnosis not present

## 2021-03-20 DIAGNOSIS — I517 Cardiomegaly: Secondary | ICD-10-CM | POA: Diagnosis not present

## 2021-03-20 DIAGNOSIS — I10 Essential (primary) hypertension: Secondary | ICD-10-CM | POA: Diagnosis not present

## 2021-04-02 ENCOUNTER — Ambulatory Visit
Admission: RE | Admit: 2021-04-02 | Discharge: 2021-04-02 | Disposition: A | Discharge status: Home / Self Care | Payer: PPO | Source: Ambulatory Visit | Attending: Surgery | Admitting: Surgery

## 2021-04-02 DIAGNOSIS — E890 Postprocedural hypothyroidism: Secondary | ICD-10-CM | POA: Diagnosis not present

## 2021-04-02 DIAGNOSIS — E041 Nontoxic single thyroid nodule: Secondary | ICD-10-CM

## 2021-04-03 DIAGNOSIS — I10 Essential (primary) hypertension: Secondary | ICD-10-CM | POA: Diagnosis not present

## 2021-04-11 DIAGNOSIS — Z9009 Acquired absence of other part of head and neck: Secondary | ICD-10-CM | POA: Diagnosis not present

## 2021-04-11 DIAGNOSIS — E041 Nontoxic single thyroid nodule: Secondary | ICD-10-CM | POA: Diagnosis not present

## 2021-05-03 ENCOUNTER — Telehealth: Payer: Self-pay | Admitting: Family Medicine

## 2021-05-03 NOTE — Telephone Encounter (Signed)
Pt called stating that she broke her right hip and bursitis in her left hip. She states that she is experiencing a lot of pain and is requesting to have medication to help manage. Please advise.       [id number] [place], [address]  [place] [address]  Phone: [phone] Fax: [phone]  Hours: Not open 24 hours

## 2021-05-03 NOTE — Telephone Encounter (Signed)
Left message to call back. Ok for [facility] to triage this call. Need more info about when she fell, etc.

## 2021-05-07 NOTE — Telephone Encounter (Signed)
Patient called, left VM to return the call to the office to discuss hip pain.

## 2021-06-17 ENCOUNTER — Other Ambulatory Visit: Payer: Self-pay | Admitting: Family Medicine

## 2021-06-17 DIAGNOSIS — N390 Urinary tract infection, site not specified: Secondary | ICD-10-CM

## 2021-06-17 NOTE — Telephone Encounter (Signed)
Medication Refill - Medication:   nitrofurantoin (MACRODANTIN) 50 MG capsule   Has the patient contacted their pharmacy? No. Pt does not want to use [facility] anymore, wants to use mail order pharmacy and wants a 90 day supply sent in. Pt stated a new script needs to be sent into the mail order pharmacy.    Preferred Pharmacy (with phone number or street name):   [facility]  [place] [address]  Phone: [phone] Fax: [phone]     Has the patient been seen for an appointment in the last year OR does the patient have an upcoming appointment? Yes.    Agent: Please be advised that RX refills may take up to 3 business days. We ask that you follow-up with your pharmacy.

## 2021-06-17 NOTE — Telephone Encounter (Signed)
Requested medications are due for refill today.  yes  Requested medications are on the active medications list.  yes  Last refill. 01/14/2021  Future visit scheduled.   no  Notes to clinic.  No protocol assigned.

## 2021-06-18 MED ORDER — NITROFURANTOIN MACROCRYSTAL 50 MG PO CAPS: 50.0000 mg | ORAL_CAPSULE | Freq: Every day | ORAL | 3 refills | Status: DC

## 2021-06-18 NOTE — Telephone Encounter (Signed)
LOV: 10/26/2020 NOV: None   Last Refills: 01/14/2021 #90 1 Refill.   Thanks,   -Mickel Baas

## 2021-08-05 DIAGNOSIS — S72011A Unspecified intracapsular fracture of right femur, initial encounter for closed fracture: Secondary | ICD-10-CM | POA: Diagnosis not present

## 2021-09-17 IMAGING — MG DIGITAL SCREENING BILAT W/ TOMO W/ CAD
6 of 10 series · 6 of 30 positions shown · non-contrast
Comparison: Previous exam(s).

ACR Breast Density Category a: The breast tissue is almost entirely
fatty.

CLINICAL DATA: Screening.

EXAM:
DIGITAL SCREENING BILATERAL MAMMOGRAM WITH TOMO AND CAD

[R MLO synth-2D]
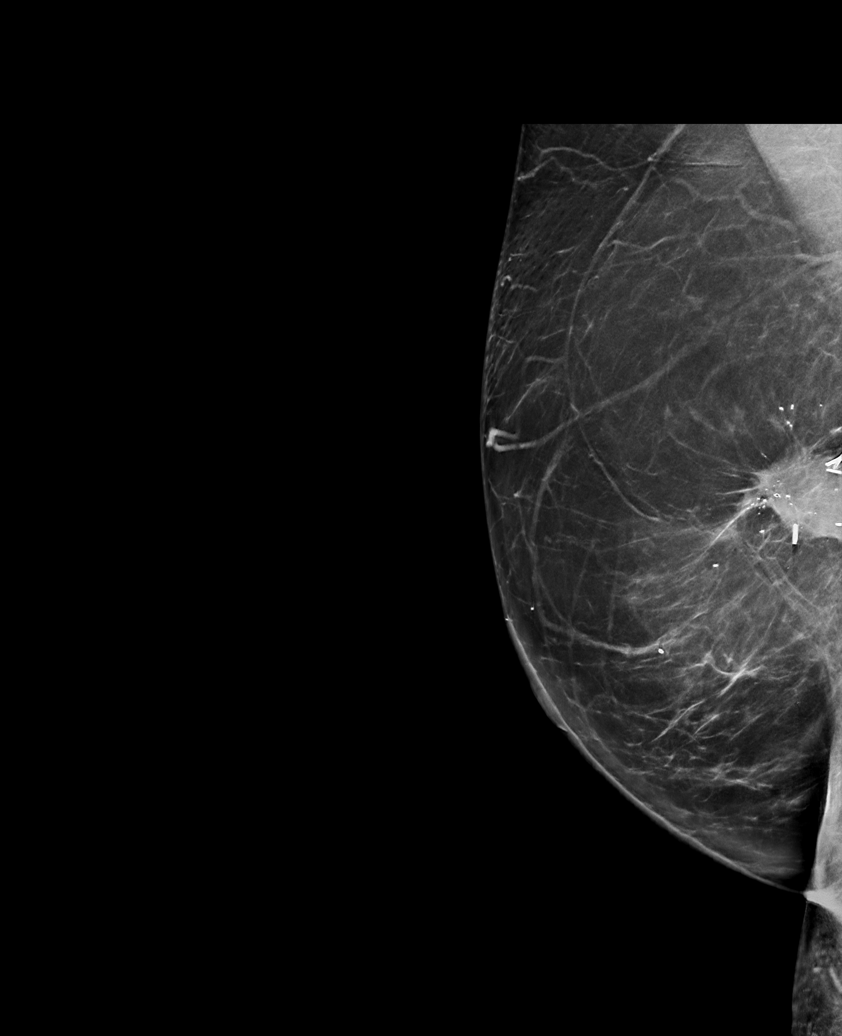

[R XCCL synth-2D]
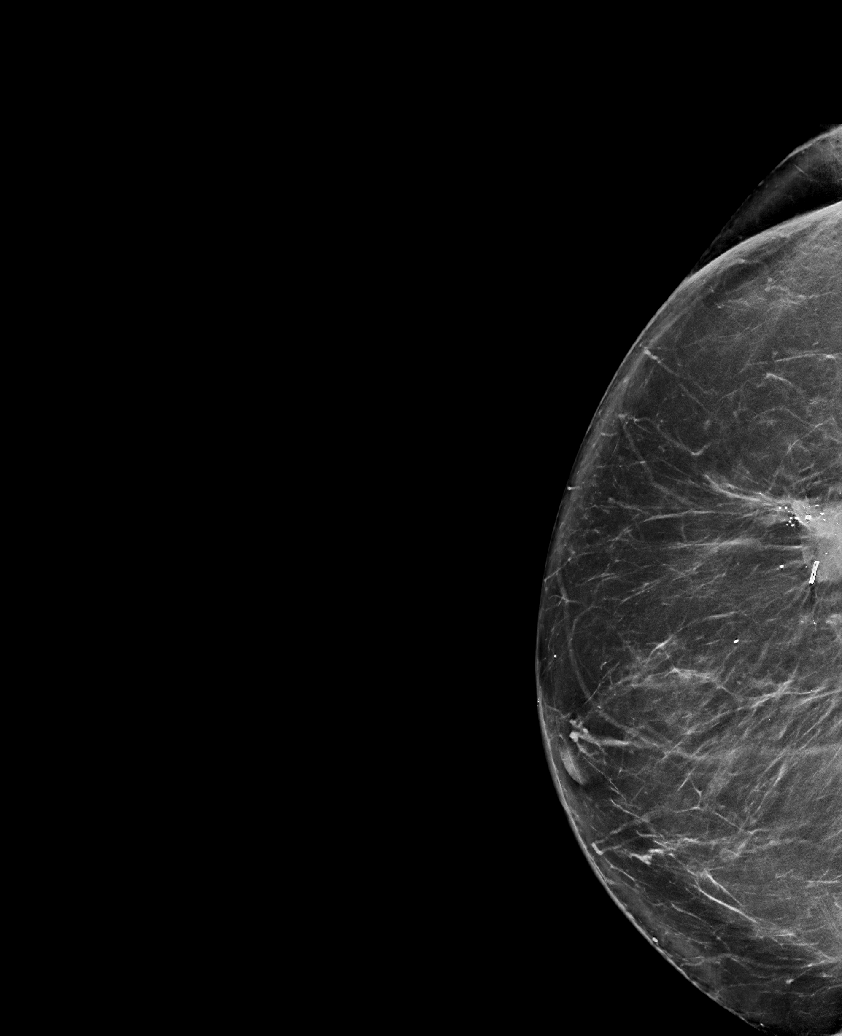

[L CC synth-2D]
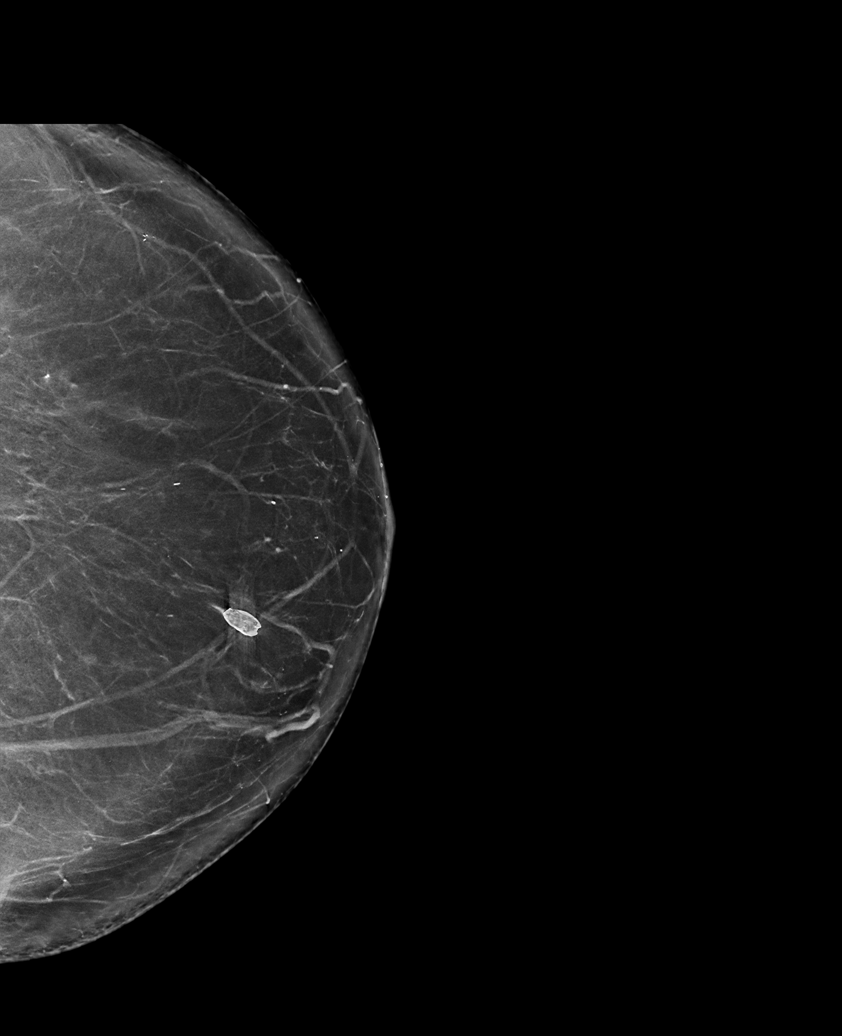

[L MLO synth-2D]
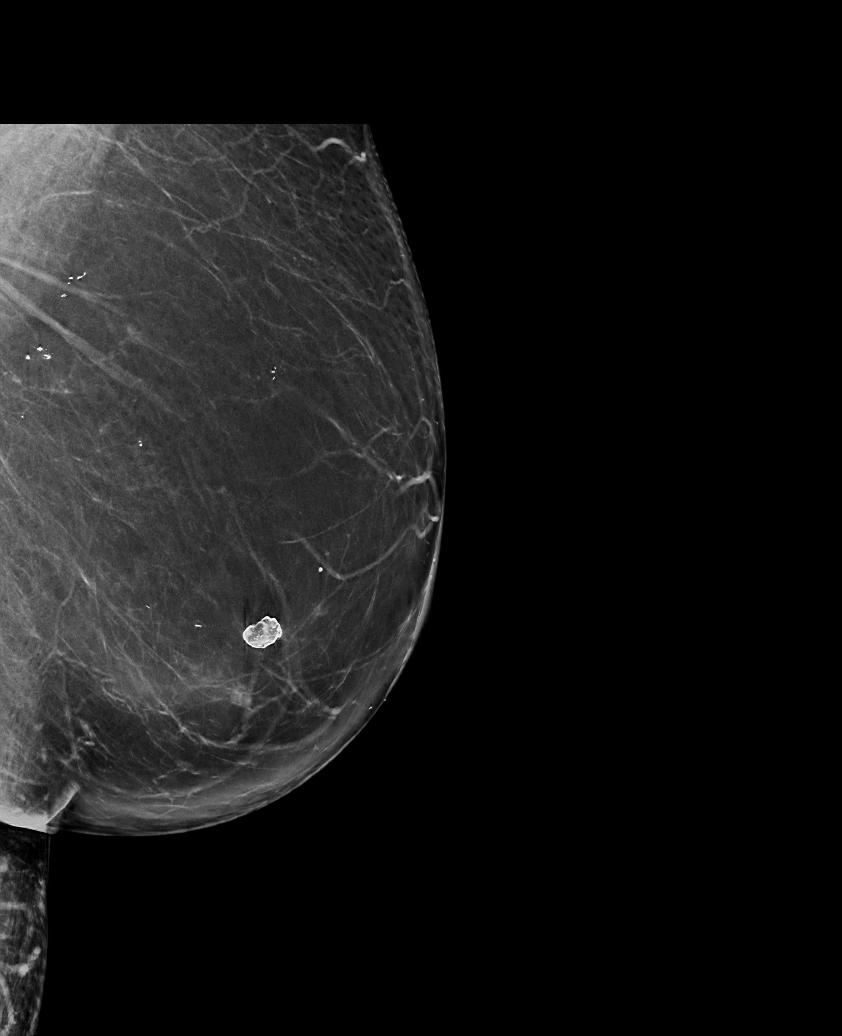

[R CC synth-2D]
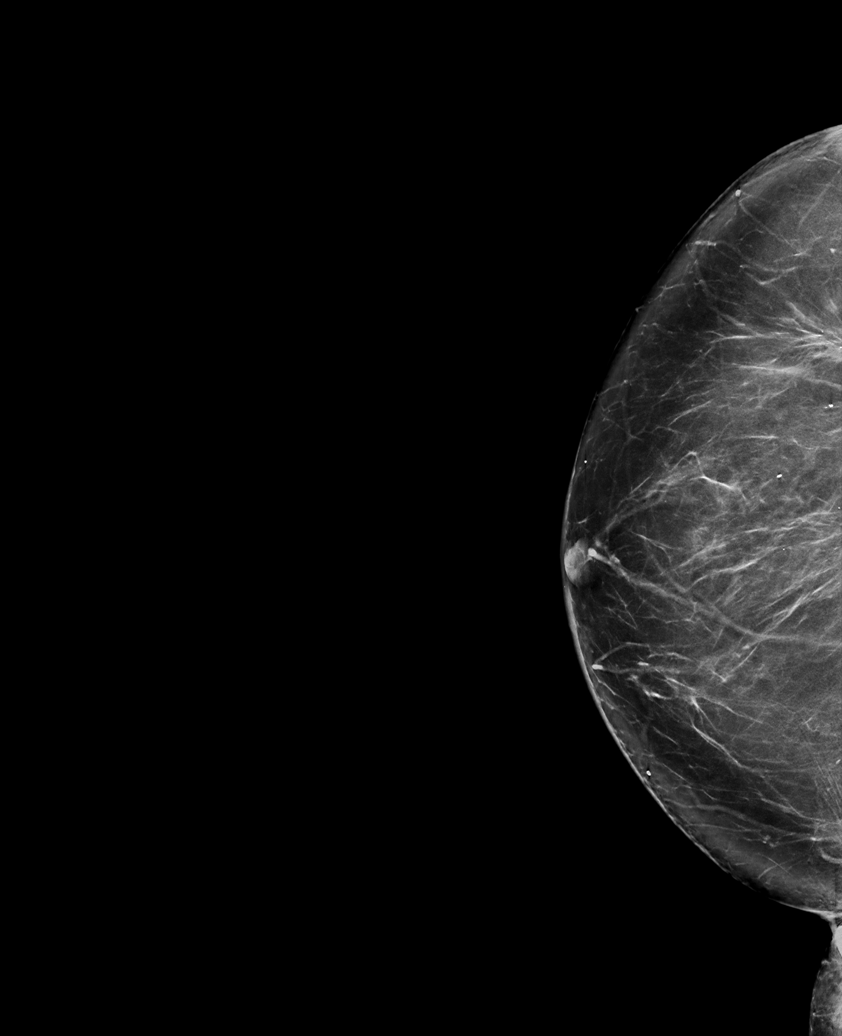

[R CC tomo · tomo slice 41/80.0]
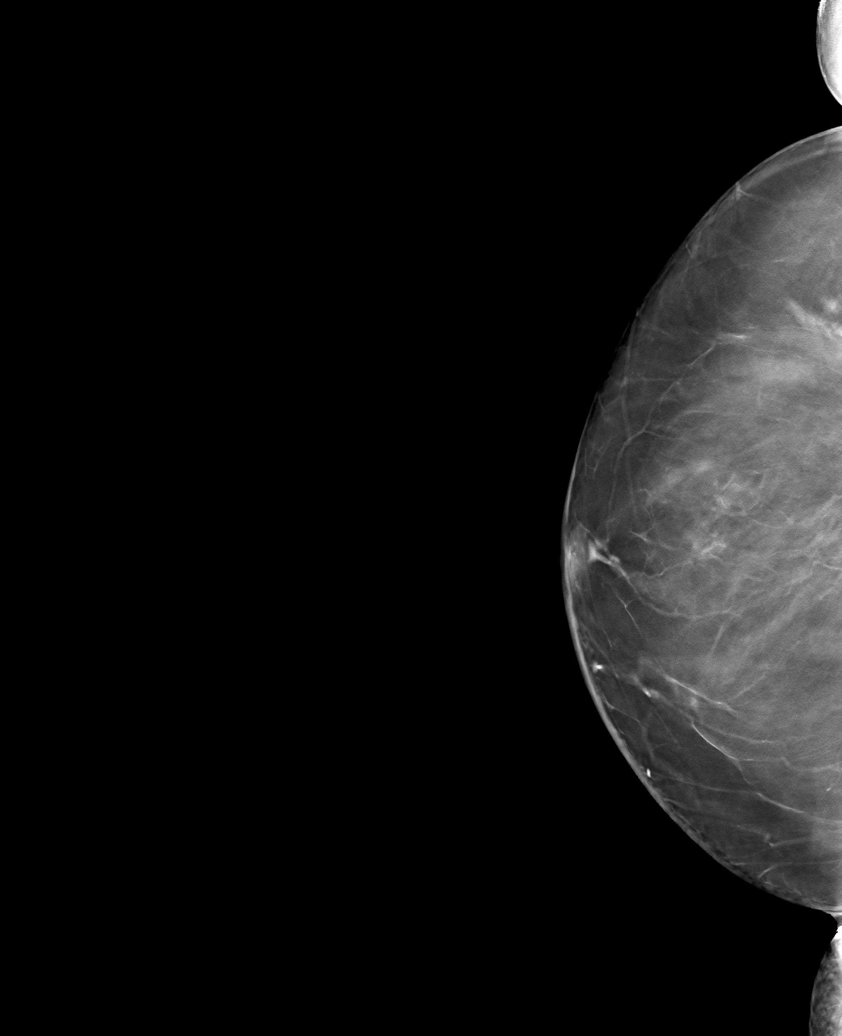

[6 of 30 positions shown; findings below may reference images not displayed]

FINDINGS: There are no findings suspicious for malignancy. Images were
processed with CAD.
IMPRESSION: No mammographic evidence of malignancy. A result letter of this
screening mammogram will be mailed directly to the patient.

RECOMMENDATION:
Screening mammogram in one year. (Code:[id number]-[id number])

BI-RADS CATEGORY  1: Negative.

## 2022-01-14 ENCOUNTER — Other Ambulatory Visit: Payer: Self-pay | Admitting: Surgery

## 2022-01-14 DIAGNOSIS — Z1231 Encounter for screening mammogram for malignant neoplasm of breast: Secondary | ICD-10-CM

## 2022-02-14 ENCOUNTER — Ambulatory Visit
Admission: RE | Admit: 2022-02-14 | Discharge: 2022-02-14 | Disposition: A | Discharge status: Home / Self Care | Payer: PPO | Source: Ambulatory Visit | Attending: Surgery | Admitting: Surgery

## 2022-02-14 DIAGNOSIS — Z1231 Encounter for screening mammogram for malignant neoplasm of breast: Secondary | ICD-10-CM | POA: Diagnosis not present

## 2022-03-25 DIAGNOSIS — E7800 Pure hypercholesterolemia, unspecified: Secondary | ICD-10-CM | POA: Diagnosis not present

## 2022-03-25 DIAGNOSIS — I10 Essential (primary) hypertension: Secondary | ICD-10-CM | POA: Diagnosis not present

## 2022-03-25 DIAGNOSIS — K219 Gastro-esophageal reflux disease without esophagitis: Secondary | ICD-10-CM | POA: Diagnosis not present

## 2022-03-25 DIAGNOSIS — I493 Ventricular premature depolarization: Secondary | ICD-10-CM | POA: Diagnosis not present

## 2022-04-17 ENCOUNTER — Other Ambulatory Visit: Payer: Self-pay

## 2022-04-17 ENCOUNTER — Telehealth: Payer: Self-pay | Admitting: Family Medicine

## 2022-04-17 DIAGNOSIS — N390 Urinary tract infection, site not specified: Secondary | ICD-10-CM

## 2022-04-17 NOTE — Telephone Encounter (Signed)
[facility] faxed refill request for the following medications:  nitrofurantoin (MACRODANTIN) 50 MG capsule   Please advise.

## 2022-07-29 ENCOUNTER — Ambulatory Visit: Payer: PPO | Admitting: Family Medicine

## 2022-07-30 ENCOUNTER — Ambulatory Visit (INDEPENDENT_AMBULATORY_CARE_PROVIDER_SITE_OTHER): Payer: PPO | Admitting: Family Medicine

## 2022-07-30 ENCOUNTER — Encounter: Payer: Self-pay | Admitting: Family Medicine

## 2022-07-30 ENCOUNTER — Ambulatory Visit: Payer: Self-pay | Admitting: *Deleted

## 2022-07-30 VITALS — BP 138/78 | HR 72 | Temp 97.9°F | Resp 16 | Wt 190.0 lb

## 2022-07-30 DIAGNOSIS — J069 Acute upper respiratory infection, unspecified: Secondary | ICD-10-CM

## 2022-07-30 MED ORDER — AMOXICILLIN 500 MG PO CAPS: 500.0000 mg | ORAL_CAPSULE | Freq: Two times a day (BID) | ORAL | 0 refills | Status: AC

## 2022-07-30 NOTE — Telephone Encounter (Signed)
Summary: Rx clarification pharmacy   Christen from [facility] stated she needs clarification on the medication amoxicillin (AMOXIL) 500 MG capsule directions and quantity.  Holley Dexter stated pt believes directions are wrong and should be 1 capsule twice a day for 5 days, qty 10.  Current directions Sig - Route: Take 1 capsule (500 mg total) by mouth 2 (two) times daily for 5 doses. - Oral    Pharmacy seeking clinical advice.      Reason for Disposition  [1] Pharmacy calling with prescription question AND [2] triager unable to answer question  Answer Assessment - Initial Assessment Questions 1. NAME of MEDICINE: "What medicine(s) are you calling about?"     Amoxil 2. QUESTION: "What is your question?" (e.g., double dose of medicine, side effect)     Pharmacy needs provider to review Rx 3. PRESCRIBER: "Who prescribed the medicine?" Reason: if prescribed by specialist, call should be referred to that group.     [place]  Protocols used: Medication Question Call-A-AH

## 2022-07-30 NOTE — Progress Notes (Signed)
   SUBJECTIVE:   CHIEF COMPLAINT / HPI:   UPPER RESPIRATORY TRACT INFECTION - symptom onset 10 days ago - has not tested for COVID.  Fever: no Cough: yes, productive of mucus Shortness of breath: no Chest pain: no Chest tightness: no Chest congestion: yes Nasal congestion: yes Sinus pressure:  a little Ear pain: no  Ear pressure: no  Vomiting: no Sick contacts: no Context: slightly better after starting abx Recurrent sinusitis: no Relief with OTC cold/cough medications: yes  Treatments attempted: amoxicillin (started Monday, 5 pills total), mucinex DM    OBJECTIVE:   BP 138/78 (BP Location: Left Arm, Patient Position: Sitting, Cuff Size: Large)   Pulse 72   Temp 97.9 F (36.6 C) (Oral)   Resp 16   Wt 190 lb (86.2 kg)   SpO2 96%   BMI 30.67 kg/m   Gen: well appearing, in NAD HEENT: orophyarynx clear without exudate or erythema. Uvula midline. No tonsillar enlargement. Good dentition. TM visible b/l without erythema, purulence. Slight bulging and air fluid level seen in R TM.  Card: RRR Lungs: CTAB Ext: WWP, no edema   ASSESSMENT/PLAN:   URI Improving. Given outside window for COVID treatment or quarantine, will defer COVID testing. Given already started antibiotic course for sinusitis and with some improvement and duration of symptoms, reasonable to complete course, rx amoxicillin sent. Reviewed OTC symptom relief, return and emergency precautions.      Myles Gip, DO

## 2022-07-31 NOTE — Telephone Encounter (Signed)
Spoke with pharmacy and clarified. °

## 2022-09-10 ENCOUNTER — Telehealth: Payer: Self-pay | Admitting: Family Medicine

## 2022-09-10 NOTE — Telephone Encounter (Signed)
Left message for patient to call back and schedule Medicare Annual Wellness Visit (AWV) in office.   If not able to come in office, please offer to do virtually or by telephone.  Left office number and my jabber [phone].  Last AWV:03/19/2017   Please schedule at anytime with Nurse Health Advisor.

## 2022-09-10 NOTE — Telephone Encounter (Signed)
I called patient to schedule AWV.  Patient said she has an upcoming appointment with Dr.Fisher for her cpx and she didn't need the AWV.  I let her know the AWV could be done before her appointment with Dr.Fisher and he would receive the information for her appointment, but she said she's not interested and just wants to see Dr.Fisher.

## 2022-09-23 NOTE — Progress Notes (Unsigned)
I,[name],acting as a scribe for [name], MD.,have documented all relevant documentation on the behalf of [name], MD,as directed by  [name], MD while in the presence of [name], MD.   Complete Physical Exam      Patient: Martha Evans, Female    DOB: 1945-12-27, 76 y.o.   MRN: [id number] Visit Date: 09/24/2022  Today's Provider: [name], MD   Chief Complaint  Patient presents with   Annual Exam   Subjective    Martha Evans is a 76 y.o. female who presents today for her complete physical examination  She reports consuming a general diet. Home exercise routine includes some walking. She generally feels well. She reports sleeping well. She does have additional problems to discuss today.   Her primary complaint today is chronic right hip pain since she fractured her hip in 2021. She takes 2 x '200mg'$  ibuprofen most days which is helpful. She has had follow up with orthopedist and states was given prescription for an NSAID that she was told to take occasionally, but she never filled.   She has also been followed by Dr. Ubaldo Glassing for several year for lipids and hypertension, but he is retired now so she plans on having her blood pressure medications refilled here.    Medications: Outpatient Medications Prior to Visit  Medication Sig   IBUPROFEN PO Take by mouth.   nitrofurantoin (MACRODANTIN) 50 MG capsule Take 1 capsule (50 mg total) by mouth at bedtime.   omeprazole (PRILOSEC) 20 MG capsule Take 20 mg by mouth 2 (two) times daily before a meal.    telmisartan (MICARDIS) 40 MG tablet Take 40 mg by mouth at bedtime.    ibuprofen (ADVIL) 200 MG tablet Take 400 mg by mouth daily.   No facility-administered medications prior to visit.    Allergies  Allergen Reactions   Codeine Nausea Only and Nausea And Vomiting   Codeine Sulfate Nausea And Vomiting   Tramadol Nausea Only    Nausea    Hydrocodone Nausea Only   Oxycodone Nausea Only     Patient Care Team: Birdie Sons, MD as PCP - General (Family Medicine) Ubaldo Glassing Javier Docker, MD as Consulting Physician (Cardiology) Armandina Gemma, MD as Consulting Physician (General Surgery) Magrinat, Virgie Dad, MD (Inactive) as Consulting Physician (Oncology) Noreene Filbert, MD as Consulting Physician Alphonsa Overall, MD as Consulting Physician (General Surgery)  Review of Systems  HENT:  Positive for congestion and hearing loss.   Respiratory:  Positive for cough.   Allergic/Immunologic: Positive for environmental allergies.  All other systems reviewed and are negative.       Objective    Vitals: BP 128/71 (BP Location: Left Arm, Patient Position: Sitting, Cuff Size: Large)   Pulse 68   Resp 16   Ht '5\' 6"'$  (1.676 m)   Wt 188 lb (85.3 kg)   SpO2 95%   BMI 30.34 kg/m     Physical Exam  General Appearance:    Mildly obese female. Alert, cooperative, in no acute distress, appears stated age   Head:    Normocephalic, without obvious abnormality, atraumatic  Eyes:    PERRL, conjunctiva/corneas clear, EOM's intact, fundi    benign, both eyes  Ears:    Normal TM's and external ear canals, both ears  Nose:   Nares normal, septum midline, mucosa normal, no drainage    or sinus tenderness  Throat:   Lips, mucosa, and tongue normal; teeth and gums normal  Neck:  Supple, symmetrical, trachea midline, no adenopathy;    thyroid:  no enlargement/tenderness/nodules; no carotid   bruit or JVD  Back:     Symmetric, no curvature, ROM normal, no CVA tenderness  Lungs:     Clear to auscultation bilaterally, respirations unlabored  Chest Wall:    No tenderness or deformity   Heart:    Normal heart rate. Normal rhythm. No murmurs, rubs, or gallops.   Breast Exam:    deferred  Abdomen:     Soft, non-tender, bowel sounds active all four quadrants,    no masses, no organomegaly  Pelvic:    deferred  Extremities:   All extremities are intact. No cyanosis or edema  Pulses:   2+ and  symmetric all extremities  Skin:   Skin color, texture, turgor normal, no rashes or lesions  Lymph nodes:   Cervical, supraclavicular, and axillary nodes normal  Neurologic:   CNII-XII intact, normal strength, sensation and reflexes    throughout      Assessment & Plan     1. Annual physical exam Mildly obese. Otherwise normal exam. She declined recommended flu vaccine.  2. Hypercholesteremia Diet controlled, previously followed by Dr. Ubaldo Glassing - Lipid panel - TSH - Comprehensive metabolic panel  3. History of thyroid lobectomy, right  - TSH  4. Primary hypertension Well controlled.  Continue current medications.    5. Osteopenia, unspecified location DEXA scan ordered today  6. Vitamin D deficiency  - VITAMIN D 25 Hydroxy (Vit-D Deficiency, Fractures)  7. Recurrent UTI refill nitrofurantoin (MACRODANTIN) 50 MG capsule; Take 1 capsule (50 mg total) by mouth at bedtime.  Dispense: 90 capsule; Refill: 3  8. Gastroesophageal reflux disease without esophagitis Well controlled on current PPi  9. 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  Dispense: 90 tablet; Refill: 3  10. Wears hearing aid      The entirety of the information documented in the History of Present Illness, Review of Systems and Physical Exam were personally obtained by me. Portions of this information were initially documented by the CMA and reviewed by me for thoroughness and accuracy.     [name], MD  [facility] [phone] (phone) [phone] (fax)  [place]

## 2022-09-24 ENCOUNTER — Ambulatory Visit (INDEPENDENT_AMBULATORY_CARE_PROVIDER_SITE_OTHER): Payer: PPO | Admitting: Family Medicine

## 2022-09-24 ENCOUNTER — Encounter: Payer: Self-pay | Admitting: Family Medicine

## 2022-09-24 VITALS — BP 128/71 | HR 68 | Resp 16 | Ht 66.0 in | Wt 188.0 lb

## 2022-09-24 DIAGNOSIS — M25551 Pain in right hip: Secondary | ICD-10-CM | POA: Diagnosis not present

## 2022-09-24 DIAGNOSIS — G8929 Other chronic pain: Secondary | ICD-10-CM | POA: Diagnosis not present

## 2022-09-24 DIAGNOSIS — Z87440 Personal history of urinary (tract) infections: Secondary | ICD-10-CM

## 2022-09-24 DIAGNOSIS — Z974 Presence of external hearing-aid: Secondary | ICD-10-CM | POA: Diagnosis not present

## 2022-09-24 DIAGNOSIS — E2839 Other primary ovarian failure: Secondary | ICD-10-CM

## 2022-09-24 DIAGNOSIS — Z0000 Encounter for general adult medical examination without abnormal findings: Secondary | ICD-10-CM

## 2022-09-24 DIAGNOSIS — E7800 Pure hypercholesterolemia, unspecified: Secondary | ICD-10-CM

## 2022-09-24 DIAGNOSIS — E559 Vitamin D deficiency, unspecified: Secondary | ICD-10-CM | POA: Diagnosis not present

## 2022-09-24 DIAGNOSIS — I10 Essential (primary) hypertension: Secondary | ICD-10-CM

## 2022-09-24 DIAGNOSIS — E890 Postprocedural hypothyroidism: Secondary | ICD-10-CM

## 2022-09-24 DIAGNOSIS — N390 Urinary tract infection, site not specified: Secondary | ICD-10-CM | POA: Insufficient documentation

## 2022-09-24 DIAGNOSIS — M8580 Other specified disorders of bone density and structure, unspecified site: Secondary | ICD-10-CM | POA: Diagnosis not present

## 2022-09-24 DIAGNOSIS — K219 Gastro-esophageal reflux disease without esophagitis: Secondary | ICD-10-CM

## 2022-09-24 DIAGNOSIS — Z136 Encounter for screening for cardiovascular disorders: Secondary | ICD-10-CM | POA: Diagnosis not present

## 2022-09-24 MED ORDER — IBUPROFEN 400 MG PO TABS: 400.0000 mg | ORAL_TABLET | Freq: Every day | ORAL | 3 refills | Status: DC | PRN

## 2022-09-24 MED ORDER — NITROFURANTOIN MACROCRYSTAL 50 MG PO CAPS: 50.0000 mg | ORAL_CAPSULE | Freq: Every day | ORAL | 3 refills | Status: DC

## 2022-09-24 NOTE — Progress Notes (Signed)
Annual Wellness Visit     Patient: Nell Gales, Female    DOB: Jun 20, 1946, 76 y.o.   MRN: [id number] Visit Date: 09/24/2022  Today's Provider: [name], MD    Subjective    Richardo Priest Blas is a 76 y.o. female who presents today for her Annual Wellness Visit.  Medications: Outpatient Medications Prior to Visit  Medication Sig   omeprazole (PRILOSEC) 20 MG capsule Take 20 mg by mouth 2 (two) times daily before a meal.    telmisartan (MICARDIS) 40 MG tablet Take 40 mg by mouth at bedtime.    [DISCONTINUED] IBUPROFEN PO Take by mouth.   [DISCONTINUED] nitrofurantoin (MACRODANTIN) 50 MG capsule Take 1 capsule (50 mg total) by mouth at bedtime.   [DISCONTINUED] ibuprofen (ADVIL) 200 MG tablet Take 400 mg by mouth daily.   No facility-administered medications prior to visit.    Allergies  Allergen Reactions   Codeine Nausea Only and Nausea And Vomiting   Codeine Sulfate Nausea And Vomiting   Tramadol Nausea Only    Nausea    Hydrocodone Nausea Only   Oxycodone Nausea Only    Patient Care Team: Birdie Sons, MD as PCP - General (Family Medicine) Ubaldo Glassing Javier Docker, MD as Consulting Physician (Cardiology) Armandina Gemma, MD as Consulting Physician (General Surgery) Magrinat, Virgie Dad, MD (Inactive) as Consulting Physician (Oncology) Noreene Filbert, MD as Consulting Physician Alphonsa Overall, MD as Consulting Physician (General Surgery)     Objective     Most recent functional status assessment:    09/24/2022    9:11 AM  In your present state of health, do you have any difficulty performing the following activities:  Hearing? 1  Vision? 0  Difficulty concentrating or making decisions? 0  Walking or climbing stairs? 0  Dressing or bathing? 0  Doing errands, shopping? 0   Most recent fall risk assessment:    09/24/2022    9:12 AM  Fall Risk   Falls in the past year? 0  Injury with Fall? 0  Risk for fall due to : History of fall(s)  Follow up  Falls evaluation completed    Most recent depression screenings:    09/24/2022    9:11 AM 07/30/2022   11:00 AM  PHQ 2/9 Scores  PHQ - 2 Score 0 0  PHQ- 9 Score 1 0   Most recent cognitive screening:    03/19/2017    2:40 PM  6CIT Screen  What Year? 0 points  What month? 0 points  What time? 0 points  Count back from 20 0 points  Months in reverse 0 points  Repeat phrase 0 points  Total Score 0 points   Most recent Audit-C alcohol use screening    09/24/2022    9:10 AM  Alcohol Use Disorder Test (AUDIT)  1. How often do you have a drink containing alcohol? 1  2. How many drinks containing alcohol do you have on a typical day when you are drinking? 0  3. How often do you have six or more drinks on one occasion? 0  AUDIT-C Score 1   A score of 3 or more in women, and 4 or more in men indicates increased risk for alcohol abuse, EXCEPT if all of the points are from question 1   No results found for any visits on 09/24/22.  Assessment & Plan     Annual wellness visit done today including the all of the following: Reviewed patient's Family Medical History Reviewed  and updated list of patient's medical providers Assessment of cognitive impairment was done Assessed patient's functional ability Established a written schedule for health screening services Health Risk Assessent Completed and Reviewed  Exercise Activities and Dietary recommendations  Goals      Reduce sugar intake to X grams per day     Recommend decreasing sugar and carbohydrate intake. Pt to start cutting out breads and sweets.          Immunization History  Administered Date(s) Administered   Influenza, High Dose Seasonal PF 07/09/2017   Pneumococcal Conjugate-13 07/09/2017   Pneumococcal Polysaccharide-23 06/30/2013   Tdap 12/31/2012   Zoster, Live 12/31/2012    Health Maintenance  Topic Date Due   COVID-19 Vaccine (1) Never done   Zoster Vaccines- Shingrix (1 of 2) Never done   Medicare  Annual Wellness (AWV)  03/19/2018   DEXA SCAN  01/14/2022   INFLUENZA VACCINE  12/28/2022 (Originally 04/29/2022)   DTaP/Tdap/Td (2 - Td or Tdap) 01/01/2023   COLONOSCOPY (Pts 45-54yr Insurance coverage will need to be confirmed)  08/22/2024   Pneumonia Vaccine 75 Years old  Completed   Hepatitis C Screening  Completed   HPV VACCINES  Aged Out     Discussed health benefits of physical activity, and encouraged her to engage in regular exercise appropriate for her age and condition.        DLelon Huh MD  [phone](phone) [phone](fax)  [place]

## 2022-09-25 ENCOUNTER — Encounter: Payer: Self-pay | Admitting: *Deleted

## 2022-09-25 LAB — VITAMIN D 25 HYDROXY (VIT D DEFICIENCY, FRACTURES): Vit D, 25-Hydroxy: 21.8 ng/mL — ABNORMAL LOW (ref 30.0–100.0)

## 2022-09-25 LAB — LIPID PANEL
Chol/HDL Ratio: 3.8 ratio (ref 0.0–4.4)
Cholesterol, Total: 217 mg/dL — ABNORMAL HIGH (ref 100–199)
HDL: 57 mg/dL (ref >39)
LDL Chol Calc (NIH): 137 mg/dL — ABNORMAL HIGH (ref 0–99)
Triglycerides: 131 mg/dL (ref 0–149)
VLDL Cholesterol Cal: 23 mg/dL (ref 5–40)

## 2022-09-25 LAB — COMPREHENSIVE METABOLIC PANEL
ALT: 14 IU/L (ref 0–32)
AST: 16 IU/L (ref 0–40)
Albumin/Globulin Ratio: 1.9 (ref 1.2–2.2)
Albumin: 4.3 g/dL (ref 3.8–4.8)
Alkaline Phosphatase: 98 IU/L (ref 44–121)
BUN/Creatinine Ratio: 23 (ref 12–28)
BUN: 21 mg/dL (ref 8–27)
Bilirubin Total: 0.5 mg/dL (ref 0.0–1.2)
CO2: 22 mmol/L (ref 20–29)
Calcium: 9.5 mg/dL (ref 8.7–10.3)
Chloride: 104 mmol/L (ref 96–106)
Creatinine, Ser: 0.93 mg/dL (ref 0.57–1.00)
Globulin, Total: 2.3 g/dL (ref 1.5–4.5)
Glucose: 100 mg/dL — ABNORMAL HIGH (ref 70–99)
Potassium: 3.9 mmol/L (ref 3.5–5.2)
Sodium: 140 mmol/L (ref 134–144)
Total Protein: 6.6 g/dL (ref 6.0–8.5)
eGFR: 64 mL/min/{1.73_m2} (ref >59)

## 2022-09-25 LAB — TSH: TSH: 1.38 u[IU]/mL (ref 0.450–4.500)

## 2023-01-06 ENCOUNTER — Other Ambulatory Visit: Payer: Self-pay | Admitting: Surgery

## 2023-01-06 DIAGNOSIS — Z1231 Encounter for screening mammogram for malignant neoplasm of breast: Secondary | ICD-10-CM

## 2023-02-16 ENCOUNTER — Ambulatory Visit
Admission: RE | Admit: 2023-02-16 | Discharge: 2023-02-16 | Disposition: A | Discharge status: Home / Self Care | Payer: PPO | Source: Ambulatory Visit | Attending: Surgery | Admitting: Surgery

## 2023-02-16 DIAGNOSIS — Z1231 Encounter for screening mammogram for malignant neoplasm of breast: Secondary | ICD-10-CM

## 2023-02-18 NOTE — Progress Notes (Signed)
Good report.  tmg  Darnell Level, MD [facility] practice Office: [phone]

## 2023-03-06 ENCOUNTER — Other Ambulatory Visit: Payer: Self-pay | Admitting: Family Medicine

## 2023-03-06 DIAGNOSIS — G8929 Other chronic pain: Secondary | ICD-10-CM

## 2023-03-06 MED ORDER — IBUPROFEN 400 MG PO TABS
400.0000 mg | ORAL_TABLET | Freq: Every day | ORAL | 3 refills | Status: DC | PRN
Start: 2023-03-06 — End: 2023-09-17

## 2023-03-24 ENCOUNTER — Ambulatory Visit (INDEPENDENT_AMBULATORY_CARE_PROVIDER_SITE_OTHER): Payer: PPO | Admitting: Family Medicine

## 2023-03-24 ENCOUNTER — Encounter: Payer: Self-pay | Admitting: Family Medicine

## 2023-03-24 VITALS — BP 149/74 | HR 75 | Temp 97.5°F | Wt 188.0 lb

## 2023-03-24 DIAGNOSIS — I10 Essential (primary) hypertension: Secondary | ICD-10-CM

## 2023-03-24 DIAGNOSIS — L03115 Cellulitis of right lower limb: Secondary | ICD-10-CM

## 2023-03-24 MED ORDER — CEFADROXIL 500 MG PO CAPS: 500.0000 mg | ORAL_CAPSULE | Freq: Two times a day (BID) | ORAL | 0 refills | Status: DC

## 2023-03-24 NOTE — Progress Notes (Signed)
Established patient visit   Patient: Martha Evans   DOB: January 11, 1946   77 y.o. Female  MRN: [id number] Visit Date: 03/24/2023  Today's healthcare provider: Sherlyn Hay, DO   No chief complaint on file.  Subjective    HPI  Patient is a 77 year old female who presents for evaluation of a possible spider bite on her inner right thigh.  She states 5 days ago she was out in the yard and felt something on her thigh.  She did not see what bit her but felt it.  Since then it has gotten red, swollen, painful and is now coming to a head. She does endorse that the redness seems to have improved somewhat. She states she has been using Ibuprofen for pain and applying ice packs.   Medications: Outpatient Medications Prior to Visit  Medication Sig   ibuprofen (ADVIL) 400 MG tablet Take 1-2 tablets (400-800 mg total) by mouth daily as needed.   nitrofurantoin (MACRODANTIN) 50 MG capsule Take 1 capsule (50 mg total) by mouth at bedtime.   omeprazole (PRILOSEC) 20 MG capsule Take 20 mg by mouth 2 (two) times daily before a meal.    telmisartan (MICARDIS) 40 MG tablet Take 40 mg by mouth at bedtime.    No facility-administered medications prior to visit.    Review of Systems  Constitutional:  Negative for activity change, chills, fatigue and fever.  Respiratory:  Negative for cough and shortness of breath.   Cardiovascular:  Negative for chest pain, palpitations and leg swelling.  Gastrointestinal:  Negative for abdominal pain, nausea and vomiting.  Musculoskeletal:  Negative for arthralgias, gait problem, joint swelling and myalgias.  Skin:  Positive for color change. Negative for rash.  Neurological:  Negative for dizziness, numbness and headaches.  Psychiatric/Behavioral:  Negative for behavioral problems. The patient is not nervous/anxious.        Objective    BP (!) 149/74 (BP Location: Left Arm, Patient Position: Sitting)   Pulse 75   Temp (!) 97.5 F (36.4 C) (Oral)    Wt 188 lb (85.3 kg)   SpO2 97%   BMI 30.34 kg/m    Physical Exam Vitals reviewed.  Constitutional:      General: She is not in acute distress.    Appearance: She is well-developed.  HENT:     Head: Normocephalic and atraumatic.  Eyes:     General: No scleral icterus.    Conjunctiva/sclera: Conjunctivae normal.  Cardiovascular:     Rate and Rhythm: Normal rate and regular rhythm.  Pulmonary:     Effort: Pulmonary effort is normal. No respiratory distress.  Skin:    General: Skin is warm and dry.     Findings: No rash.       Neurological:     Mental Status: She is alert and oriented to person, place, and time.  Psychiatric:        Behavior: Behavior normal.      No results found for any visits on 03/24/23.  Assessment & Plan    1. Cellulitis of right lower extremity Patient has area suggestive of cellulitis on her right inner thigh.  Will prescribe cefadroxil as noted below, as patient has no indications of potential MRSA infection or history of colonization.  Advised patient that her symptoms (particularly pain) should start to improve in the next 24 to 48 hours and that the lesions on her skin should start to improve within 72 hours.  Discussed that  the area on her skin will likely take longer to resolve than her antibiotic course.  Encouraged her to let us know if her symptoms are not improving or if it seems to get worse. - cefadroxil (DURICEF) 500 MG capsule; Take 1 capsule (500 mg total) by mouth 2 (two) times daily.  Dispense: 10 capsule; Refill: 0  2. Primary hypertension Patient does have mildly elevated blood pressure today.  She has been taking her blood pressure medications but thought her telmisartan had already been increased to 80 mg daily.  Discussed possibly doing this today; patient opted to defer until her cardiology visit in the coming weeks.  Will continue to monitor at future appointments.    Return if symptoms worsen or fail to improve.      The  entirety of the information documented in the History of Present Illness, Review of Systems and Physical Exam were personally obtained by me. Portions of this information were initially documented by the CMA, Adline Peals, and reviewed by me for thoroughness and accuracy.   I discussed the assessment and treatment plan with the patient  The patient was provided an opportunity to ask questions and all were answered. The patient agreed with the plan and demonstrated an understanding of the instructions.   The patient was advised to call back or seek an in-person evaluation if the symptoms worsen or if the condition fails to improve as anticipated.    Sherlyn Hay, DO  [facility] [phone] (phone) [phone] (fax)  [facility]

## 2023-03-26 ENCOUNTER — Other Ambulatory Visit: Payer: Self-pay | Admitting: Surgery

## 2023-03-26 DIAGNOSIS — Z9009 Acquired absence of other part of head and neck: Secondary | ICD-10-CM

## 2023-03-26 DIAGNOSIS — E041 Nontoxic single thyroid nodule: Secondary | ICD-10-CM

## 2023-03-27 ENCOUNTER — Encounter: Payer: Self-pay | Admitting: Family Medicine

## 2023-03-27 ENCOUNTER — Ambulatory Visit: Payer: Self-pay

## 2023-03-27 DIAGNOSIS — L02415 Cutaneous abscess of right lower limb: Secondary | ICD-10-CM

## 2023-03-27 NOTE — Telephone Encounter (Signed)
  Chief Complaint: asking for more abx  Symptoms: redness 4 x 4 inches big, ulcer that is dime sized and noted the color of purple to the area. Draining pus now. Stated some hardness near the center. Pain 5/10.  Frequency: late Friday  Pertinent Negatives: Patient denies fever, rash Disposition: [] ED /[] Urgent Care (no appt availability in office) / [] Appointment(In office/virtual)/ []  [place] [facility]/ [] Home Care/ [] Refused Recommended Disposition /[] [place] [facility]/ [x]  Follow-up with PCP Additional Notes: pt stated that current abx ends tomorrow and wondered since still draining pus can she have more abx called into  Reason for Disposition  Bite starts to look bad (e.g., blister, purplish skin, ulcer)  (Exception:  Just swelling or small red bump.)  Answer Assessment - Initial Assessment Questions 1. TYPE of SPIDER: "What type of spider was it?"  (e.g., name, unknown, or brief description)     Brown recluse 2. LOCATION: "Where is the bite located?"      Inside of thigh to right  3. PAIN: "Is there any pain?" If Yes, ask: "How bad is it?"  (Scale 1-10; or mild, moderate, severe)    - NONE (0): no pain    - MILD (1-3): doesn't interfere with normal activities     - MODERATE (4-7): interferes with normal activities or awakens from sleep     - SEVERE (8-10): excruciating pain, unable to do any normal activities     5/10 4. SWELLING: "How big is the swelling?" (Inches, cm or compare to coins)      4 x 4  5. ONSET: "When did the bite occur?" (Minutes or hours ago)      Late Friday  6. TETANUS: "When was the last tetanus booster?"      Unsure of when  7. OTHER SYMPTOMS: "Do you have any other symptoms?"  (e.g., muscle cramps, abdomen pain, change in urine color)     4 *4 size and wound looks cigarette burn  or ulcer  (dime sized)and oozing pus, dark purple and hard around bullseye  and heat  Protocols used: Spider Bite - [employer]

## 2023-03-28 MED ORDER — SULFAMETHOXAZOLE-TRIMETHOPRIM 800-160 MG PO TABS
1.0000 | ORAL_TABLET | Freq: Two times a day (BID) | ORAL | 0 refills | Status: DC
Start: 2023-03-28 — End: 2023-03-30

## 2023-03-30 ENCOUNTER — Encounter: Payer: Self-pay | Admitting: Family Medicine

## 2023-03-30 ENCOUNTER — Ambulatory Visit (INDEPENDENT_AMBULATORY_CARE_PROVIDER_SITE_OTHER): Payer: PPO | Admitting: Family Medicine

## 2023-03-30 VITALS — BP 130/77 | HR 77 | Temp 98.0°F | Ht 66.0 in | Wt 187.0 lb

## 2023-03-30 DIAGNOSIS — L03115 Cellulitis of right lower limb: Secondary | ICD-10-CM | POA: Diagnosis not present

## 2023-03-30 MED ORDER — CEFADROXIL 500 MG PO CAPS
500.0000 mg | ORAL_CAPSULE | Freq: Two times a day (BID) | ORAL | 0 refills | Status: AC
Start: 2023-03-30 — End: 2023-04-04

## 2023-03-30 NOTE — Progress Notes (Signed)
      Established patient visit   Patient: Martha Evans   DOB: 1946-06-10   78 y.o. Female  MRN: [id number] Visit Date: 03/30/2023  Today's healthcare provider: Mila Merry, MD   Chief Complaint  Patient presents with   Follow-up    Spider bite Pt stated--looks a little better, but still having stinging sensation, redness, and open wound. Tried warm compress and finish antibiotic. Pt denied fever. Pt taking bactrim and her feel shaky, and tingling .   Subjective    Discussed the use of AI scribe software for clinical note transcription with the patient, who gave verbal consent to proceed.  History of Present Illness   The patient presents for follow up spider bite with secondar that occurred approximately ten days ago while gardening. She did not see the spider, but later that night, she noticed a 'burned streak' across her leg and some swelling. The area was initially hot and painful and she presented 6 days ago with secondary cellulitis and prescribed Duracef (Cefadroxil) x 5 days, during which the burning and heat improved significantly. The patient also applied warm compresses as advised, which seemed to help with the redness. However, the infection did not completely clear up after the course of antibiotics. The patient was then prescribed trimethoprim sulfa, but she reported adverse effects including restlessness, itchiness, and difficulty sleeping. The infection has not worsened since the antibiotics ran out, but it has not improved either.       Medications: Outpatient Medications Prior to Visit  Medication Sig   ibuprofen (ADVIL) 400 MG tablet Take 1-2 tablets (400-800 mg total) by mouth daily as needed.   nitrofurantoin (MACRODANTIN) 50 MG capsule Take 1 capsule (50 mg total) by mouth at bedtime.   omeprazole (PRILOSEC) 20 MG capsule Take 20 mg by mouth 2 (two) times daily before a meal.    telmisartan (MICARDIS) 40 MG tablet Take 40 mg by mouth at bedtime.     [DISCONTINUED] sulfamethoxazole-trimethoprim (BACTRIM DS) 800-160 MG tablet Take 1 tablet by mouth 2 (two) times daily for 7 days. Replaces cefadroxil   No facility-administered medications prior to visit.   Review of Systems     Objective    BP 130/77 (BP Location: Left Arm, Patient Position: Sitting, Cuff Size: Large)   Pulse 77   Temp 98 F (36.7 C)   Ht 5\' 6"  (1.676 m)   Wt 187 lb (84.8 kg)   SpO2 96%   BMI 30.18 kg/m    Physical Exam  Area of induration on skin erosion right inner thigh with small area or surrounding erythema which she reports as marked improvement compared to 1 week ago. No drainage. Erythematous area minimally tender.   Assessment & Plan     Assessment and Plan    Spider Bite: Suspected brown recluse spider bite with skin reaction. Initial improvement with Duracef, but not completely resolved. Adverse reaction to trimethoprim sulfa (fidgety, itchy skin). -Discontinue trimethoprim sulfa. -Resume Duracef, send prescription to [facility] pharmacy. -Continue warm compresses. -Leave wound open to air unless it rubs against clothing.           Mila Merry, MD  [facility] [phone] (phone) [phone] (fax)  [facility]

## 2023-04-08 ENCOUNTER — Telehealth: Payer: Self-pay | Admitting: *Deleted

## 2023-04-08 NOTE — Patient Outreach (Signed)
  Care Coordination   Initial Visit Note   04/09/2023 Name: Martha Evans MRN: [id number] DOB: December 14, 1945  Martha Evans is a 77 y.o. year old female who sees Fisher, Demetrios Isaacs, MD for primary care. I spoke with  Scarlett Presto by phone today.  What matters to the patients health and wellness today?  Continue to manage chronic conditions well without complications.  Denies need for follow up but will call with questions in the future.    Goals Addressed             This Visit's Progress    COMPLETED: Care Coordination Activities - No follow up needed       . Interventions Today    Flowsheet Row Most Recent Value  Chronic Disease   Chronic disease during today's visit Other, Hypertension (HTN)  [spider bite/cellulitis]  General Interventions   General Interventions Discussed/Reviewed General Interventions Reviewed, Doctor Visits, Health Screening  Doctor Visits Discussed/Reviewed Doctor Visits Reviewed, Annual Wellness Visits, PCP  Health Screening Bone Density, Mammogram, Colonoscopy  [No longer getting colonoscopy due to GI history, all other screenings up to date]  PCP/Specialist Visits Compliance with follow-up visit  Education Interventions   Education Provided Provided Education  Provided Verbal Education On Medication, When to see the doctor  [Denies issues with recurrent cellulits, state wound has healed with antibiotics]              SDOH assessments and interventions completed:  Yes  SDOH Interventions Today    Flowsheet Row Most Recent Value  SDOH Interventions   Food Insecurity Interventions Intervention Not Indicated  Housing Interventions Intervention Not Indicated  Transportation Interventions Intervention Not Indicated        Care Coordination Interventions:  Yes, provided   Follow up plan: No further intervention required.   Encounter Outcome:  Pt. Visit Completed   Kemper Durie, RN, MSN, [facility] Care Management Care Management  Coordinator [phone]

## 2023-04-09 ENCOUNTER — Encounter: Payer: Self-pay | Admitting: *Deleted

## 2023-04-09 ENCOUNTER — Other Ambulatory Visit: Payer: PPO

## 2023-04-22 ENCOUNTER — Ambulatory Visit
Admission: RE | Admit: 2023-04-22 | Discharge: 2023-04-22 | Disposition: A | Discharge status: Home / Self Care | Payer: PPO | Source: Ambulatory Visit | Attending: Surgery | Admitting: Surgery

## 2023-04-22 DIAGNOSIS — Z9009 Acquired absence of other part of head and neck: Secondary | ICD-10-CM

## 2023-04-22 DIAGNOSIS — E041 Nontoxic single thyroid nodule: Secondary | ICD-10-CM

## 2023-04-24 DIAGNOSIS — I10 Essential (primary) hypertension: Secondary | ICD-10-CM | POA: Diagnosis not present

## 2023-04-24 DIAGNOSIS — I517 Cardiomegaly: Secondary | ICD-10-CM | POA: Diagnosis not present

## 2023-04-24 DIAGNOSIS — E7800 Pure hypercholesterolemia, unspecified: Secondary | ICD-10-CM | POA: Diagnosis not present

## 2023-04-24 DIAGNOSIS — I493 Ventricular premature depolarization: Secondary | ICD-10-CM | POA: Diagnosis not present

## 2023-09-14 ENCOUNTER — Telehealth: Payer: Self-pay | Admitting: Family Medicine

## 2023-09-14 DIAGNOSIS — G8929 Other chronic pain: Secondary | ICD-10-CM

## 2023-09-14 NOTE — Telephone Encounter (Signed)
Received fax from [facility] needing refill authorizations on Ibuprofen 400 mg.

## 2023-09-17 MED ORDER — IBUPROFEN 400 MG PO TABS
400.0000 mg | ORAL_TABLET | Freq: Every day | ORAL | 3 refills | Status: AC | PRN
Start: 2023-09-17 — End: ?

## 2023-09-17 NOTE — Addendum Note (Signed)
Addended by: Shirley Muscat on: 09/17/2023 10:14 AM   Modules accepted: Orders

## 2023-12-09 ENCOUNTER — Telehealth: Payer: Self-pay | Admitting: Family Medicine

## 2023-12-09 DIAGNOSIS — N390 Urinary tract infection, site not specified: Secondary | ICD-10-CM

## 2023-12-09 MED ORDER — NITROFURANTOIN MACROCRYSTAL 50 MG PO CAPS: 50.0000 mg | ORAL_CAPSULE | Freq: Every day | ORAL | 3 refills | Status: AC

## 2023-12-09 NOTE — Telephone Encounter (Signed)
 [facility] is requesting new prescription nitrofurantoin (MACRODANTIN) 50 MG capsule   Please advise

## 2024-01-11 ENCOUNTER — Encounter: Payer: Self-pay | Admitting: Family Medicine

## 2024-01-11 ENCOUNTER — Ambulatory Visit (INDEPENDENT_AMBULATORY_CARE_PROVIDER_SITE_OTHER): Admitting: Family Medicine

## 2024-01-11 ENCOUNTER — Ambulatory Visit
Admission: RE | Admit: 2024-01-11 | Discharge: 2024-01-11 | Disposition: A | Discharge status: Home / Self Care | Source: Ambulatory Visit | Attending: Family Medicine | Admitting: Family Medicine

## 2024-01-11 VITALS — BP 173/88 | HR 70 | Temp 98.0°F | Ht 66.0 in | Wt 188.0 lb

## 2024-01-11 DIAGNOSIS — R5383 Other fatigue: Secondary | ICD-10-CM | POA: Diagnosis not present

## 2024-01-11 DIAGNOSIS — R21 Rash and other nonspecific skin eruption: Secondary | ICD-10-CM

## 2024-01-11 DIAGNOSIS — R42 Dizziness and giddiness: Secondary | ICD-10-CM

## 2024-01-11 DIAGNOSIS — H539 Unspecified visual disturbance: Secondary | ICD-10-CM

## 2024-01-11 DIAGNOSIS — M89319 Hypertrophy of bone, unspecified shoulder: Secondary | ICD-10-CM

## 2024-01-11 DIAGNOSIS — M2550 Pain in unspecified joint: Secondary | ICD-10-CM | POA: Diagnosis not present

## 2024-01-11 DIAGNOSIS — I10 Essential (primary) hypertension: Secondary | ICD-10-CM | POA: Diagnosis not present

## 2024-01-11 DIAGNOSIS — E7800 Pure hypercholesterolemia, unspecified: Secondary | ICD-10-CM

## 2024-01-11 DIAGNOSIS — E890 Postprocedural hypothyroidism: Secondary | ICD-10-CM | POA: Diagnosis not present

## 2024-01-11 DIAGNOSIS — T7840XA Allergy, unspecified, initial encounter: Secondary | ICD-10-CM

## 2024-01-11 DIAGNOSIS — E559 Vitamin D deficiency, unspecified: Secondary | ICD-10-CM | POA: Diagnosis not present

## 2024-01-11 DIAGNOSIS — M19012 Primary osteoarthritis, left shoulder: Secondary | ICD-10-CM | POA: Diagnosis not present

## 2024-01-11 MED ORDER — FLUTICASONE PROPIONATE 50 MCG/ACT NA SUSP: 2.0000 | Freq: Every day | NASAL | 2 refills | Status: AC

## 2024-01-11 NOTE — Progress Notes (Signed)
 Established patient visit   Patient: Martha Evans   DOB: December 25, 1945   78 y.o. Female  MRN: [id number] Visit Date: 01/11/2024  Today's healthcare provider: Mila Merry, MD   Chief Complaint  Patient presents with   Hypothyroidism    Patient is post partial thyroidectomy and would like her labs checked.     Possible Allergies    Patient complains of having a cough for years that she has never been able to get diagnosed.  She does take allergy medication for it and states it helps some.   She hs never been to any specialist for it and would like to have allergy testing done.  She would like referral to Allergist/Pulmonologist.    FH Lupus    Patient states she's has a family member with Lupus and she is concerned some of her symptoms may be coming from having Lupus.  She states she has a redness on her face and has developed a rash in her scalp.  She complains of multiple joint pain andstiffness.   Subjective    Discussed the use of AI scribe software for clinical note transcription with the patient, who gave verbal consent to proceed.  History of Present Illness   Martha Evans is a 78 year old female with a history of breast cancer who presents with persistent dizziness, fatigue,joint pain, and a chronic cough.  She experiences persistent dizziness that has been ongoing for the past few months. Unlike her previous episodes of vertigo, the dizziness is constant and not episodic. It is particularly noticeable when bending down and standing up, leading her to avoid activities like climbing ladders due to fear of falling.  She has joint pain that migrates between different joints, including her shoulder, ankle, and knees. She has a history of a right hip fracture three years ago and takes ibuprofen for pain management. The joint pain is accompanied by dry, patchy skin on her cheeks. she is concerned these may be signs of Lupus  She has a chronic cough with significant  phlegm production and drainage. This cough has persisted despite previous lung x-rays showing no abnormalities. It begins with a tickle in her throat and is not typical of a cold. She occasionally takes Claritin and has used Flonase and other over-the-counter nasal sprays in the past.  She reports she had right thyroid removed  by Dr. Sid Falcon at [facility] many years ago due to a tumor. Recently, she noticed a lump on the left side of her neck just above clavicle.   She reports feeling tired all the time, which is unusual for her as she is typically very active. She also experiences poor sleep quality.  She has noticed changes in her vision, including blurriness and dryness. Her eyes are dry, itchy, and red, and her vision has worsened even with reading glasses.  She has a history of high blood pressure managed with Telmisartan 80 mg for several years, but she has not had her medication reviewed since her previous doctor retired. She does not monitor her blood pressure at home but can tell when it is high.       Medications: Outpatient Medications Prior to Visit  Medication Sig   ibuprofen (ADVIL) 400 MG tablet Take 1-2 tablets (400-800 mg total) by mouth daily as needed.   nitrofurantoin (MACRODANTIN) 50 MG capsule Take 1 capsule (50 mg total) by mouth at bedtime.   omeprazole (PRILOSEC) 20 MG capsule Take 20 mg by mouth 2 (  two) times daily before a meal.    telmisartan (MICARDIS) 40 MG tablet Take 40 mg by mouth at bedtime.    No facility-administered medications prior to visit.        Objective    BP (!) 173/88 (BP Location: Left Arm, Patient Position: Sitting, Cuff Size: Normal)   Pulse 70   Temp 98 F (36.7 C) (Oral)   Ht 5\' 6"  (1.676 m)   Wt 188 lb (85.3 kg)   SpO2 98%   BMI 30.34 kg/m   Physical Exam   General: Appearance:    Mildly obese female in no acute distress  Eyes:    PERRL, conjunctiva/corneas clear, EOM's intact       Lungs:     Clear to  auscultation bilaterally, respirations unlabored  Derm:   Faint erythema over both cheeks.   Heart:    Normal heart rate. Normal rhythm. No murmurs, rubs, or gallops.    MS:   All extremities are intact.  Hard, grape sized non tender nodule just superior to left clavicle, possibly part of clavicle.   Neurologic:   Awake, alert, oriented x 3. No apparent focal neurological defect.         Assessment & Plan     1. Other fatigue (Primary)   2. Rash of face She is very concerned about this be related to autoimmune disorder such as Lupus.  - ANA Direct w/Reflex if Positive  3. Arthralgia, unspecified joint  - ANA Direct w/Reflex if Positive - Uric acid  4. Dizzinesses  - CBC with Differential/Platelet  5. Supraclavicular mass (left)  - DG Clavicle Left; Future  Consider history of right thyroid tumor will consider thyroid ultrasound.   6. History of thyroid lobectomy, right  - TSH - T4, free  7. Primary hypertension Currently only taking telmisartan once a day rather than twice a day as previously prescribed. Can continue every day as BP is fairly well controlled.   8. Vitamin D deficiency  - VITAMIN D 25 Hydroxy (Vit-D Deficiency, Fractures)  9. Hypercholesteremia Diet controlled.  - Lipid panel - Comprehensive metabolic panel with GFR  10. Allergic disorder, initial encounter  - fluticasone (FLONASE) 50 MCG/ACT nasal spray; Place 2 sprays into both nostrils daily.  Dispense: 16 g; Refill: 2  She can't tell what she is reacting too and requests RAST testing.  - Perennial allergen profile IgE  11. Vision changes She is in process of scheduling thorough eye exam.       Jeralene Mom, MD  [facility] [phone] (phone) [phone] (fax)  [facility]

## 2024-01-11 NOTE — Patient Instructions (Signed)
 Please review the attached list of medications and notify my office if there are any errors.   Go to [facility] at [facility] for your Xrays (phone no. [phone])

## 2024-01-19 ENCOUNTER — Encounter: Payer: Self-pay | Admitting: Family Medicine

## 2024-01-20 LAB — ALLERGEN PROFILE, PERENNIAL ALLERGEN IGE

## 2024-01-20 LAB — ANA W/REFLEX IF POSITIVE: Anti Nuclear Antibody (ANA): NEGATIVE

## 2024-01-20 LAB — LIPID PANEL
Chol/HDL Ratio: 3.8 ratio (ref 0.0–4.4)
Cholesterol, Total: 207 mg/dL — ABNORMAL HIGH (ref 100–199)
HDL: 54 mg/dL (ref >39)
LDL Chol Calc (NIH): 126 mg/dL — ABNORMAL HIGH (ref 0–99)
Triglycerides: 154 mg/dL — ABNORMAL HIGH (ref 0–149)
VLDL Cholesterol Cal: 27 mg/dL (ref 5–40)

## 2024-01-20 LAB — COMPREHENSIVE METABOLIC PANEL WITH GFR
ALT: 13 IU/L (ref 0–32)
AST: 15 IU/L (ref 0–40)
Albumin: 4.2 g/dL (ref 3.8–4.8)
Alkaline Phosphatase: 101 IU/L (ref 44–121)
BUN/Creatinine Ratio: 21 (ref 12–28)
BUN: 18 mg/dL (ref 8–27)
Bilirubin Total: 0.4 mg/dL (ref 0.0–1.2)
CO2: 24 mmol/L (ref 20–29)
Calcium: 9.7 mg/dL (ref 8.7–10.3)
Chloride: 105 mmol/L (ref 96–106)
Creatinine, Ser: 0.84 mg/dL (ref 0.57–1.00)
Globulin, Total: 2.4 g/dL (ref 1.5–4.5)
Glucose: 99 mg/dL (ref 70–99)
Potassium: 4.1 mmol/L (ref 3.5–5.2)
Sodium: 142 mmol/L (ref 134–144)
Total Protein: 6.6 g/dL (ref 6.0–8.5)
eGFR: 72 mL/min/{1.73_m2} (ref >59)

## 2024-01-20 LAB — CBC WITH DIFFERENTIAL/PLATELET
Basophils Absolute: 0 10*3/uL (ref 0.0–0.2)
Basos: 1 %
EOS (ABSOLUTE): 0.2 10*3/uL (ref 0.0–0.4)
Eos: 4 %
Hematocrit: 40.7 % (ref 34.0–46.6)
Hemoglobin: 13.2 g/dL (ref 11.1–15.9)
Immature Grans (Abs): 0 10*3/uL (ref 0.0–0.1)
Immature Granulocytes: 0 %
Lymphocytes Absolute: 1.3 10*3/uL (ref 0.7–3.1)
Lymphs: 24 %
MCH: 30.1 pg (ref 26.6–33.0)
MCHC: 32.4 g/dL (ref 31.5–35.7)
MCV: 93 fL (ref 79–97)
Monocytes Absolute: 0.5 10*3/uL (ref 0.1–0.9)
Monocytes: 10 %
Neutrophils Absolute: 3.4 10*3/uL (ref 1.4–7.0)
Neutrophils: 61 %
Platelets: 181 10*3/uL (ref 150–450)
RBC: 4.38 x10E6/uL (ref 3.77–5.28)
RDW: 12.2 % (ref 11.7–15.4)
WBC: 5.5 10*3/uL (ref 3.4–10.8)

## 2024-01-20 LAB — T4, FREE: Free T4: 1.23 ng/dL (ref 0.82–1.77)

## 2024-01-20 LAB — URIC ACID: Uric Acid: 5.9 mg/dL (ref 3.1–7.9)

## 2024-01-20 LAB — TSH: TSH: 1.61 u[IU]/mL (ref 0.450–4.500)

## 2024-01-20 LAB — VITAMIN D 25 HYDROXY (VIT D DEFICIENCY, FRACTURES): Vit D, 25-Hydroxy: 23.3 ng/mL — ABNORMAL LOW (ref 30.0–100.0)

## 2024-01-21 ENCOUNTER — Encounter: Payer: Self-pay | Admitting: Family Medicine

## 2024-01-22 ENCOUNTER — Encounter: Payer: Self-pay | Admitting: Family Medicine

## 2024-01-22 DIAGNOSIS — R21 Rash and other nonspecific skin eruption: Secondary | ICD-10-CM

## 2024-01-22 DIAGNOSIS — Z853 Personal history of malignant neoplasm of breast: Secondary | ICD-10-CM

## 2024-01-27 ENCOUNTER — Other Ambulatory Visit: Payer: Self-pay | Admitting: Family Medicine

## 2024-01-27 ENCOUNTER — Encounter: Payer: Self-pay | Admitting: Family Medicine

## 2024-01-27 DIAGNOSIS — R221 Localized swelling, mass and lump, neck: Secondary | ICD-10-CM

## 2024-02-08 ENCOUNTER — Other Ambulatory Visit: Payer: Self-pay | Admitting: Surgery

## 2024-02-08 ENCOUNTER — Telehealth: Payer: Self-pay

## 2024-02-08 DIAGNOSIS — Z1231 Encounter for screening mammogram for malignant neoplasm of breast: Secondary | ICD-10-CM

## 2024-02-08 DIAGNOSIS — N644 Mastodynia: Secondary | ICD-10-CM

## 2024-02-08 DIAGNOSIS — Z853 Personal history of malignant neoplasm of breast: Secondary | ICD-10-CM

## 2024-02-08 NOTE — Addendum Note (Signed)
 Addended by: Lamon Pillow on: 02/08/2024 04:34 PM   Modules accepted: Orders

## 2024-02-08 NOTE — Telephone Encounter (Signed)
 Copied from CRM [id number]. Topic: General - Other >> Feb 08, 2024  9:28 AM Dyane Glance wrote: Reason for CRM: Pt states she called [facility] to schedule her mammogram and was told she will need a diagnostic mammogram. She states she has pain in right breast at 3:00 position. She has also had breast cancer Please advise

## 2024-02-10 ENCOUNTER — Other Ambulatory Visit: Payer: Self-pay | Admitting: Surgery

## 2024-02-10 DIAGNOSIS — Z853 Personal history of malignant neoplasm of breast: Secondary | ICD-10-CM

## 2024-02-26 ENCOUNTER — Ambulatory Visit
Admission: RE | Admit: 2024-02-26 | Discharge: 2024-02-26 | Disposition: A | Discharge status: Home / Self Care | Source: Ambulatory Visit | Attending: Surgery | Admitting: Surgery

## 2024-02-26 DIAGNOSIS — Z853 Personal history of malignant neoplasm of breast: Secondary | ICD-10-CM | POA: Diagnosis not present

## 2024-02-26 DIAGNOSIS — N6311 Unspecified lump in the right breast, upper outer quadrant: Secondary | ICD-10-CM | POA: Diagnosis not present

## 2024-02-26 DIAGNOSIS — N644 Mastodynia: Secondary | ICD-10-CM | POA: Diagnosis not present

## 2024-03-18 ENCOUNTER — Ambulatory Visit: Admitting: Allergy

## 2024-04-10 NOTE — Progress Notes (Unsigned)
 New Patient Note  RE: Martha Evans MRN: [id number] DOB: 1946-05-21 Date of Office Visit: 04/11/2024  Consult requested by: Gasper Nancyann BRAVO, MD Primary care provider: Gasper Nancyann BRAVO, MD  Chief Complaint: No chief complaint on file.  History of Present Illness: I had the pleasure of seeing Martha Evans for initial evaluation at the [facility] on 04/11/2024. She is a 78 y.o. female, who is referred here by Gasper Nancyann BRAVO, MD for the evaluation of ***.  Discussed the use of AI scribe software for clinical note transcription with the patient, who gave verbal consent to proceed.  History of Present Illness             ***  Assessment and Plan: Martha Evans is a 78 y.o. female with: ***  Assessment and Plan               No follow-ups on file.  No orders of the defined types were placed in this encounter.  Lab Orders  No laboratory test(s) ordered today    Other allergy screening: Asthma: {Blank single:19197::yes,no} Rhino conjunctivitis: {Blank single:19197::yes,no} Food allergy: {Blank single:19197::yes,no} Medication allergy: {Blank single:19197::yes,no} Hymenoptera allergy: {Blank single:19197::yes,no} Urticaria: {Blank single:19197::yes,no} Eczema:{Blank single:19197::yes,no} History of recurrent infections suggestive of immunodeficency: {Blank single:19197::yes,no}  Diagnostics: Spirometry:  Tracings reviewed. Her effort: {Blank single:19197::Good reproducible efforts.,It was hard to get consistent efforts and there is a question as to whether this reflects a maximal maneuver.,Poor effort, data can not be interpreted.} FVC: ***L FEV1: ***L, ***% predicted FEV1/FVC ratio: ***% Interpretation: {Blank single:19197::Spirometry consistent with mild obstructive disease,Spirometry consistent with moderate obstructive disease,Spirometry consistent with severe obstructive disease,Spirometry  consistent with possible restrictive disease,Spirometry consistent with mixed obstructive and restrictive disease,Spirometry uninterpretable due to technique,Spirometry consistent with normal pattern,No overt abnormalities noted given today's efforts}.  Please see scanned spirometry results for details.  Skin Testing: {Blank single:19197::Select foods,Environmental allergy panel,Environmental allergy panel and select foods,Food allergy panel,None,Deferred due to recent antihistamines use}. *** Results discussed with patient/family.   Past Medical History: Patient Active Problem List   Diagnosis Date Noted  . Recurrent UTI 09/24/2022  . Wears hearing aid 09/24/2022  . Osteopenia 09/12/2020  . PVC's (premature ventricular contractions) 02/06/2020  . Allergic rhinitis 10/11/2015  . Colon polyp 10/11/2015  . Vitamin D  deficiency 10/11/2015  . Cardiac enlargement 05/24/2015  . Acid reflux 05/24/2015  . Hypercholesteremia 05/24/2015  . Primary hypertension 05/24/2015  . Chronic right hip pain 11/22/2014  . S/P colostomy takedown 09/15/14 09/15/2014  . History of diverticular abscess of colon 07/19/2014  . Drug-induced neutropenia (HCC) 05/22/2014  . Malignant neoplasm of lower-outer quadrant of right breast of female, estrogen receptor positive (HCC) 12/07/2013  . Multiple thyroid  nodules, left lobe 07/25/2013  . History of thyroid  lobectomy, right 07/25/2013   Past Medical History:  Diagnosis Date  . Allergy   . Breast cancer (HCC) 2015   right  . Cardiomegaly   . Complication of anesthesia    was hard to intubate with thyroid  surgery-99  . Difficult intubation 1999   when had thyroid  lobectomy  . Dysrhythmia    irregular reuglar heart beat   . Herpes zoster without complication 01/15/2010  . Hip fracture (HCC) 09/12/2020  . History of chicken pox   . History of measles   . Personal history of chemotherapy   . Personal history of radiation therapy     Past Surgical History: Past Surgical History:  Procedure Laterality Date  . ABDOMINAL HYSTERECTOMY  1999  .  APPENDECTOMY     as child  . BREAST LUMPECTOMY    . BREAST REDUCTION SURGERY Bilateral    1980's  . BREAST SURGERY Right 12/01/2013   ultrasound guided  . COLON RESECTION N/A 09/15/2014   Procedure: Laparoscopic hand assisted colostomy reversal with resection of left colon and laparoscopic lysis of adhesions ;  Surgeon: Alm Angle, MD;  Location: [facility];  Service: General;  Laterality: N/A;  . COLONOSCOPY    . COLONOSCOPY N/A 08/22/2014   Procedure: COLONOSCOPY;  Surgeon: Alm Angle, MD;  Location: [facility];  Service: General;  Laterality: N/A;  . ESOPHAGOGASTRODUODENOSCOPY (EGD) WITH PROPOFOL  N/A 05/12/2018   Procedure: ESOPHAGOGASTRODUODENOSCOPY (EGD) WITH PROPOFOL ;  Surgeon: Viktoria Lamar DASEN, MD;  Location: [facility];  Service: Endoscopy;  Laterality: N/A;  . HIP ARTHROPLASTY Right 09/13/2020   Procedure: ARTHROPLASTY BIPOLAR HIP (HEMIARTHROPLASTY);  Surgeon: Cleotilde Barrio, MD;  Location: [facility];  Service: Orthopedics;  Laterality: Right;  . LAPAROTOMY N/A 02/11/2014   Procedure: EXPLORATORY LAPAROTOMY, COLON RESECTION, COLOSTOMY;  Surgeon: Alm VEAR Angle, MD;  Location: [facility];  Service: General;  Laterality: N/A;  . PARTIAL MASTECTOMY WITH NEEDLE LOCALIZATION AND AXILLARY SENTINEL LYMPH NODE BX Right 12/12/2013   Procedure: PARTIAL MASTECTOMY WITH NEEDLE LOCALIZATION AND AXILLARY SENTINEL LYMPH NODE BX;  Surgeon: Krystal CHRISTELLA Spinner, MD;  Location: [facility];  Service: General;  Laterality: Right;  . PORT-A-CATH REMOVAL Left 04/20/2015   Procedure: REMOVAL PORT-A-CATH;  Surgeon: Krystal Spinner, MD;  Location: [facility];  Service: General;  Laterality: Left;  . PORTACATH PLACEMENT Left 01/02/2014   Procedure: INSERTION PORT-A-CATH;  Surgeon: Krystal CHRISTELLA Spinner, MD;  Location: [facility];  Service: General;  Laterality: Left;  . ROTATOR CUFF  REPAIR  2006   right shoulder  . THYROID  LOBECTOMY Right 1999   Dr. Krystal Montenegro [facility]  . TONSILLECTOMY     as child   Medication List:  Current Outpatient Medications  Medication Sig Dispense Refill  . fluticasone  (FLONASE ) 50 MCG/ACT nasal spray Place 2 sprays into both nostrils daily. 16 g 2  . ibuprofen  (ADVIL ) 400 MG tablet Take 1-2 tablets (400-800 mg total) by mouth daily as needed. 90 tablet 3  . nitrofurantoin  (MACRODANTIN ) 50 MG capsule Take 1 capsule (50 mg total) by mouth at bedtime. 90 capsule 3  . omeprazole (PRILOSEC) 20 MG capsule Take 20 mg by mouth 2 (two) times daily before a meal.     . telmisartan (MICARDIS) 40 MG tablet Take 40 mg by mouth daily.     No current facility-administered medications for this visit.   Allergies: Allergies  Allergen Reactions  . Codeine Nausea Only and Nausea And Vomiting  . Codeine Sulfate Nausea And Vomiting  . Tramadol  Nausea Only    Nausea   . Hydrocodone  Nausea Only  . Oxycodone  Nausea Only   Social History: Social History   Socioeconomic History  . Marital status: Married    Spouse name: Not on file  . Number of children: 0  . Years of education: Not on file  . Highest education level: Not on file  Occupational History  . Occupation: Retired  Tobacco Use  . Smoking status: Never  . Smokeless tobacco: Never  Vaping Use  . Vaping status: Never Used  Substance and Sexual Activity  . Alcohol use: Yes    Comment: 1/monthly  . Drug use: No  . Sexual activity: Yes  Other Topics Concern  . Not on file  Social History Narrative  .  Not on file   Social Drivers of Health   Financial Resource Strain: Not on file  Food Insecurity: No Food Insecurity (04/08/2023)   Hunger Vital Sign   . Worried About Programme researcher, broadcasting/film/video in the Last Year: Never true   . Ran Out of Food in the Last Year: Never true  Transportation Needs: No Transportation Needs (04/08/2023)   PRAPARE - Transportation   . Lack of  Transportation (Medical): No   . Lack of Transportation (Non-Medical): No  Physical Activity: Not on file  Stress: Not on file  Social Connections: Not on file   Lives in a ***. Smoking: *** Occupation: ***  Environmental HistorySurveyor, minerals in the house: Network engineer in the family room: {Blank single:19197::yes,no} Carpet in the bedroom: {Blank single:19197::yes,no} Heating: {Blank single:19197::electric,gas,heat pump} Cooling: {Blank single:19197::central,window,heat pump} Pet: {Blank single:19197::yes ***,no}  Family History: Family History  Problem Relation Age of Onset  . Cancer Father        bladder & pancreatic  . Alzheimer's disease Mother   . Diabetes Brother    Problem                               Relation Asthma                                   *** Eczema                                *** Food allergy                          *** Allergic rhino conjunctivitis     ***  Review of Systems  Constitutional:  Negative for appetite change, chills, fever and unexpected weight change.  HENT:  Negative for congestion and rhinorrhea.   Eyes:  Negative for itching.  Respiratory:  Negative for cough, chest tightness, shortness of breath and wheezing.   Cardiovascular:  Negative for chest pain.  Gastrointestinal:  Negative for abdominal pain.  Genitourinary:  Negative for difficulty urinating.  Skin:  Negative for rash.  Neurological:  Negative for headaches.    Objective: There were no vitals taken for this visit. There is no height or weight on file to calculate BMI. Physical Exam Vitals and nursing note reviewed.  Constitutional:      Appearance: Normal appearance. She is well-developed.  HENT:     Head: Normocephalic and atraumatic.     Right Ear: Tympanic membrane and external ear normal.     Left Ear: Tympanic membrane and external ear normal.     Nose: Nose normal.     Mouth/Throat:     Mouth:  Mucous membranes are moist.     Pharynx: Oropharynx is clear.  Eyes:     Conjunctiva/sclera: Conjunctivae normal.  Cardiovascular:     Rate and Rhythm: Normal rate and regular rhythm.     Heart sounds: Normal heart sounds. No murmur heard.    No friction rub. No gallop.  Pulmonary:     Effort: Pulmonary effort is normal.     Breath sounds: Normal breath sounds. No wheezing, rhonchi or rales.  Musculoskeletal:     Cervical back: Neck supple.  Skin:    General: Skin is warm.  Findings: No rash.  Neurological:     Mental Status: She is alert and oriented to person, place, and time.  Psychiatric:        Behavior: Behavior normal.   The plan was reviewed with the patient/family, and all questions/concerned were addressed.  It was my pleasure to see Martha Evans today and participate in her care. Please feel free to contact me with any questions or concerns.  Sincerely,  Orlan Cramp, DO Allergy & Immunology  [facility]  [facility] office: [phone] [facility] office: [phone]

## 2024-04-11 ENCOUNTER — Other Ambulatory Visit: Payer: Self-pay

## 2024-04-11 ENCOUNTER — Ambulatory Visit: Admitting: Allergy

## 2024-04-11 ENCOUNTER — Encounter: Payer: Self-pay | Admitting: Allergy

## 2024-04-11 VITALS — BP 124/78 | HR 73 | Temp 98.5°F | Resp 17 | Ht 66.0 in | Wt 188.3 lb

## 2024-04-11 DIAGNOSIS — R0982 Postnasal drip: Secondary | ICD-10-CM | POA: Diagnosis not present

## 2024-04-11 DIAGNOSIS — R21 Rash and other nonspecific skin eruption: Secondary | ICD-10-CM

## 2024-04-11 DIAGNOSIS — K219 Gastro-esophageal reflux disease without esophagitis: Secondary | ICD-10-CM | POA: Diagnosis not present

## 2024-04-11 DIAGNOSIS — R053 Chronic cough: Secondary | ICD-10-CM | POA: Diagnosis not present

## 2024-04-11 MED ORDER — IPRATROPIUM BROMIDE 0.03 % NA SOLN: 1.0000 | Freq: Two times a day (BID) | NASAL | 3 refills | Status: AC | PRN

## 2024-04-11 NOTE — Patient Instructions (Addendum)
 Coughing  Normal breathing test today. The most common causes of chronic cough include the following: upper airway cough syndrome (UACS) which is caused by variety of rhinitis conditions; asthma; gastroesophageal reflux disease (GERD); chronic bronchitis from cigarette smoking or other inhaled environmental irritants; non-asthmatic eosinophilic bronchitis; and bronchiectasis.  Refer to ENT for chronic coughing. Get Chest X-ray.  Use Atrovent  (ipratropium) 0.03% 1-2 sprays per nostril twice a day as needed for runny nose/drainage. Nasal saline spray (i.e., Simply Saline) or nasal saline lavage (i.e., NeilMed) is recommended as needed and prior to medicated nasal sprays.  Use Flonase  (fluticasone ) nasal spray 1-2 sprays per nostril once a day as needed for nasal congestion.   Reflux See handout for lifestyle and dietary modifications. Continue omeprazole 20mg  once day - nothing to eat or drink for 20-30 minutes afterwards.   Rash Keep track of rashes and take pictures. Write down what you had done during flares.  See below for proper skin care. Use fragrance free and dye free products. No dryer sheets or fabric softener.    Follow up as needed depending on ENT evaluation.   Skin care recommendations  Bath time: Always use lukewarm water. AVOID very hot or cold water. Keep bathing time to 5-10 minutes. Do NOT use bubble bath. Use a mild soap and use just enough to wash the dirty areas. Do NOT scrub skin vigorously.  After bathing, pat dry your skin with a towel. Do NOT rub or scrub the skin.  Moisturizers and prescriptions:  ALWAYS apply moisturizers immediately after bathing (within 3 minutes). This helps to lock-in moisture. Use the moisturizer several times a day over the whole body. Good summer moisturizers include: Aveeno, CeraVe, Cetaphil. Good winter moisturizers include: Aquaphor, Vaseline, Cerave, Cetaphil, Eucerin, Vanicream. When using moisturizers along with  medications, the moisturizer should be applied about one hour after applying the medication to prevent diluting effect of the medication or moisturize around where you applied the medications. When not using medications, the moisturizer can be continued twice daily as maintenance.  Laundry and clothing: Avoid laundry products with added color or perfumes. Use unscented hypo-allergenic laundry products such as Tide free, Cheer free & gentle, and All free and clear.  If the skin still seems dry or sensitive, you can try double-rinsing the clothes. Avoid tight or scratchy clothing such as wool. Do not use fabric softeners or dyer sheets.

## 2024-05-17 ENCOUNTER — Ambulatory Visit

## 2024-05-17 DIAGNOSIS — L650 Telogen effluvium: Secondary | ICD-10-CM

## 2024-05-17 DIAGNOSIS — L820 Inflamed seborrheic keratosis: Secondary | ICD-10-CM | POA: Diagnosis not present

## 2024-05-17 DIAGNOSIS — L649 Androgenic alopecia, unspecified: Secondary | ICD-10-CM

## 2024-05-17 DIAGNOSIS — Z79899 Other long term (current) drug therapy: Secondary | ICD-10-CM | POA: Diagnosis not present

## 2024-05-17 MED ORDER — MINOXIDIL 2.5 MG PO TABS: ORAL_TABLET | ORAL | 1 refills | Status: DC

## 2024-05-17 MED ORDER — MINOXIDIL 2.5 MG PO TABS: ORAL_TABLET | ORAL | 1 refills | Status: AC

## 2024-05-17 NOTE — Patient Instructions (Addendum)
 Begin Minoxidil  5% foam (or solution) otherwise known as Rogaine  - this is often called Mens Strength - but we use it exclusively in men and women. You are going to apply this once or twice daily in areas where you feel are thinner than the remainder.  It can take up to 6-8 months of applying this medication to notice an improvement.  You will need to use this long term to maintain the results of increased hair growth.    Minoxidil  5% twice daily. Educated about proper use, including ensuring the medication is applied directly to the scalp. Discusssed that minoxidil  can initially cause some hair shedding. Reviewed that expectation and goal of treatment is to prevent further loss, rather than hair regrowth.    Doses of oral minoxidil  for hair loss are considered 'low dose'. This is because the doses used for hair loss are much lower than the doses which are used for conditions such as high blood pressure (hypertension). The doses used for hypertension are 10-40mg  per day.  Side effects are uncommon at the low doses (up to 2.5 mg/day) used to treat hair loss. Potential side effects, more commonly seen at higher doses, include: Increase in hair growth (hypertrichosis) elsewhere on face and body Temporary hair shedding upon starting medication which may last up to 4 weeks Ankle swelling, fluid retention, rapid weight gain more than 5 pounds Low blood pressure and feeling lightheaded or dizzy when standing up quickly Fast or irregular heartbeat Headaches      Due to recent changes in healthcare laws, you may see results of your pathology and/or laboratory studies on MyChart before the doctors have had a chance to review them. We understand that in some cases there may be results that are confusing or concerning to you. Please understand that not all results are received at the same time and often the doctors may need to interpret multiple results in order to provide you with the best plan of care or  course of treatment. Therefore, we ask that you please give us  2 business days to thoroughly review all your results before contacting the office for clarification. Should we see a critical lab result, you will be contacted sooner.   If You Need Anything After Your Visit  If you have any questions or concerns for your doctor, please call our main line at [phone] and press option 4 to reach your doctor's medical assistant. If no one answers, please leave a voicemail as directed and we will return your call as soon as possible. Messages left after 4 pm will be answered the following business day.   You may also send us  a message via MyChart. We typically respond to MyChart messages within 1-2 business days.  For prescription refills, please ask your pharmacy to contact our office. Our fax number is [phone].  If you have an urgent issue when the clinic is closed that cannot wait until the next business day, you can page your doctor at the number below.    Please note that while we do our best to be available for urgent issues outside of office hours, we are not available 24/7.   If you have an urgent issue and are unable to reach us , you may choose to seek medical care at your doctor's office, retail clinic, urgent care center, or emergency room.  If you have a medical emergency, please immediately call 911 or go to the emergency department.  Pager Numbers  - Dr. Hester: [phone]  -  Dr. Jackquline: [phone]  - Dr. Claudene: [phone]   - Dr. Raymund: [phone]  In the event of inclement weather, please call our main line at [phone] for an update on the status of any delays or closures.  Dermatology Medication Tips: Please keep the boxes that topical medications come in in order to help keep track of the instructions about where and how to use these. Pharmacies typically print the medication instructions only on the boxes and not directly on the medication tubes.    If your medication is too expensive, please contact our office at [phone] option 4 or send us  a message through MyChart.   We are unable to tell what your co-pay for medications will be in advance as this is different depending on your insurance coverage. However, we may be able to find a substitute medication at lower cost or fill out paperwork to get insurance to cover a needed medication.   If a prior authorization is required to get your medication covered by your insurance company, please allow us  1-2 business days to complete this process.  Drug prices often vary depending on where the prescription is filled and some pharmacies may offer cheaper prices.  The website www.goodrx.com contains coupons for medications through different pharmacies. The prices here do not account for what the cost may be with help from insurance (it may be cheaper with your insurance), but the website can give you the price if you did not use any insurance.  - You can print the associated coupon and take it with your prescription to the pharmacy.  - You may also stop by our office during regular business hours and pick up a GoodRx coupon card.  - If you need your prescription sent electronically to a different pharmacy, notify our office through [facility] or by phone at [phone] option 4.     Si Usted Necesita Algo Despus de Su Visita  Tambin puede enviarnos un mensaje a travs de Clinical cytogeneticist. Por lo general respondemos a los mensajes de MyChart en el transcurso de 1 a 2 das hbiles.  Para renovar recetas, por favor pida a su farmacia que se ponga en contacto con nuestra oficina. Randi lakes de fax es [place] [phone].  Si tiene un asunto urgente cuando la clnica est cerrada y que no puede esperar hasta el siguiente da hbil, puede llamar/localizar a su doctor(a) al nmero que aparece a continuacin.   Por favor, tenga en cuenta que aunque hacemos todo lo posible para estar  disponibles para asuntos urgentes fuera del horario de [place], no estamos disponibles las 24 horas del da, los 7 [address]  [address] de la [place].   Si tiene un problema urgente y no puede comunicarse con nosotros, puede optar por buscar atencin mdica  en el consultorio de su doctor(a), en una clnica privada, en un centro de atencin urgente o en una sala de emergencias.  Si tiene Engineer, drilling, por favor llame inmediatamente al 911 o vaya a la sala de emergencias.  Nmeros de bper  - Dr. Hester: [phone]  - Dra. Jackquline: [phone]  - Dr. Claudene: [phone]  - Dra. Allaina Brotzman: [phone]  En caso de inclemencias del [place], por favor llame a nuestra lnea principal al [phone] para una actualizacin sobre el estado de cualquier retraso o cierre.  Consejos para la medicacin en dermatologa: Por favor, guarde las cajas en las que vienen los medicamentos de uso tpico para ayudarle a seguir las instrucciones sobre dnde y cmo usarlos. [employer]  generalmente imprimen las instrucciones del medicamento slo en las cajas y no directamente en los tubos del [place].   Si su medicamento es muy caro, por favor, pngase en contacto con landry rieger llamando al [phone] y presione la opcin 4 o envenos un mensaje a travs de Clinical cytogeneticist.   No podemos decirle cul ser su copago por los medicamentos por adelantado ya que esto es diferente dependiendo de la cobertura de su seguro. Sin embargo, es posible que podamos encontrar un medicamento sustituto a Audiological scientist un formulario para que el seguro cubra el medicamento que se considera necesario.   Si se requiere una autorizacin previa para que su compaa de seguros malta su medicamento, por favor permtanos de 1 a 2 das hbiles para completar este proceso.  Los precios de los medicamentos varan con frecuencia dependiendo del Environmental consultant de dnde se surte la receta y alguna farmacias pueden ofrecer precios ms baratos.  El  sitio web www.goodrx.com tiene cupones para medicamentos de Health and safety inspector. Los precios aqu no tienen en cuenta lo que podra costar con la ayuda del seguro (puede ser ms barato con su seguro), pero el sitio web puede darle el precio si no utiliz Tourist information centre manager.  - Puede imprimir el cupn correspondiente y llevarlo con su receta a la farmacia.  - Tambin puede pasar por nuestra oficina durante el horario de atencin regular y Education officer, museum una tarjeta de cupones de GoodRx.  - Si necesita que su receta se enve electrnicamente a una farmacia diferente, informe a nuestra oficina a travs de MyChart de [place] o por telfono llamando al [phone] y presione la opcin 4.

## 2024-05-17 NOTE — Progress Notes (Unsigned)
   New Patient Visit   Subjective  Martha Evans is a 78 y.o. female who presents for the following: Patient c/o hair thinning for the last several months, patient noticed a itchy rash on her scalp ~ 1 month ago, she is concerned the rash is related to the hair thinning.  Shampooing with otc shampoo.  Patient broke her hip 3 years ago.   The following portions of the chart were reviewed this encounter and updated as appropriate: medications, allergies, medical history  Review of Systems:  No other skin or systemic complaints except as noted in HPI or Assessment and Plan.  Objective  Well appearing patient in no apparent distress; mood and affect are within normal limits.  A focused examination was performed of the following areas: Face,scalp  Relevant exam findings are noted in the Assessment and Plan.  left flank, right flank x 6 (6) Stuck-on, waxy, tan-brown papules and plaques -- Discussed benign etiology and prognosis.   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 Follicular minitiarization noted on dermoscopy.   Chronic and persistent condition with duration or expected duration over one year. Condition is symptomatic/ bothersome to patient. Not currently at goal.   Female Androgenic Alopecia is a chronic condition related to genetics and/or hormonal changes.  In women androgenetic alopecia is commonly associated with menopause but may occur any time after puberty.  It causes hair thinning primarily on the crown with widening of the part and temporal hairline recession.  Can use OTC Rogaine  (minoxidil ) 5% solution/foam as directed.  Oral treatments in female patients who have no contraindication may include :  Treatment Plan:  Minoxidil  5% twice daily. Educated about proper use, including ensuring the medication is applied  directly to the scalp. Discusssed that minoxidil  can initially cause some hair shedding. Reviewed that expectation and goal of treatment is to prevent further loss, rather than hair regrowth.    Start Minoxidil  1.25 mg  Screened for cardiac, renal, or hepatic disease. Side effects: Hypotension, headaches, hypertrichosis, leg swelling, rash. Expect results in 6 months.  Long term medication management.  Patient is using long term (months to years) prescription medication  to control their dermatologic condition.  These medications require periodic monitoring to evaluate for efficacy and side effects and may require periodic laboratory monitoring.     INFLAMED SEBORRHEIC KERATOSIS (6) left flank, right flank x 6 (6) Symptomatic, irritating, patient would like treated.  Destruction of lesion - left flank, right flank x 6 (6) Complexity: simple   Destruction method: cryotherapy   Informed consent: discussed and consent obtained   Timeout:  patient name, date of birth, surgical site, and procedure verified Lesion destroyed using liquid nitrogen: Yes   Region frozen until ice ball extended beyond lesion: Yes   Outcome: patient tolerated procedure well with no complications   Post-procedure details: wound care instructions given     Return in about 6 months (around 11/17/2024) for hair loss.  IFay Kirks, CMA, am acting as scribe for Lauraine JAYSON Kanaris, MD .   Documentation: I have reviewed the above documentation for accuracy and completeness, and I agree with the above.  Lauraine JAYSON Kanaris, MD

## 2024-05-19 DIAGNOSIS — E7800 Pure hypercholesterolemia, unspecified: Secondary | ICD-10-CM | POA: Diagnosis not present

## 2024-05-19 DIAGNOSIS — I10 Essential (primary) hypertension: Secondary | ICD-10-CM | POA: Diagnosis not present

## 2024-05-19 DIAGNOSIS — I493 Ventricular premature depolarization: Secondary | ICD-10-CM | POA: Diagnosis not present

## 2024-06-06 DIAGNOSIS — H2513 Age-related nuclear cataract, bilateral: Secondary | ICD-10-CM | POA: Diagnosis not present

## 2024-06-06 DIAGNOSIS — H16229 Keratoconjunctivitis sicca, not specified as Sjogren's, unspecified eye: Secondary | ICD-10-CM | POA: Diagnosis not present

## 2024-06-06 DIAGNOSIS — H43813 Vitreous degeneration, bilateral: Secondary | ICD-10-CM | POA: Diagnosis not present

## 2024-07-28 NOTE — Progress Notes (Signed)
 Martha Evans                                          MRN: [id number]   07/28/2024   The [facility] Quality Team Specialist reviewed this patient medical record for the purposes of chart review for care gap closure. The following were reviewed: abstraction for care gap closure-controlling blood pressure.    [facility] Quality Team

## 2024-07-30 ENCOUNTER — Other Ambulatory Visit (HOSPITAL_COMMUNITY): Payer: Self-pay

## 2024-10-06 NOTE — Progress Notes (Signed)
 Martha Evans                                          MRN: [id number]   10/06/2024   The [facility] Quality Team Specialist reviewed this patient medical record for the purposes of chart review for care gap closure. The following were reviewed: chart review for care gap closure-controlling blood pressure.    [facility] Quality Team

## 2024-11-07 ENCOUNTER — Ambulatory Visit
